# Patient Record
Sex: Female | Born: 1935 | Race: White | Hispanic: No | Smoking: Never smoker
Health system: Southern US, Community
[De-identification: ages and names within clinical notes are randomized; demographics above are authoritative.]

## PROBLEM LIST (undated history)

## (undated) DIAGNOSIS — E079 Disorder of thyroid, unspecified: Secondary | ICD-10-CM

## (undated) DIAGNOSIS — L299 Pruritus, unspecified: Secondary | ICD-10-CM

## (undated) DIAGNOSIS — D46C Myelodysplastic syndrome with isolated del(5q) chromosomal abnormality: Principal | ICD-10-CM

## (undated) DIAGNOSIS — R1012 Left upper quadrant pain: Secondary | ICD-10-CM

## (undated) DIAGNOSIS — L409 Psoriasis, unspecified: Secondary | ICD-10-CM

## (undated) DIAGNOSIS — D649 Anemia, unspecified: Secondary | ICD-10-CM

## (undated) DIAGNOSIS — E7800 Pure hypercholesterolemia, unspecified: Secondary | ICD-10-CM

## (undated) DIAGNOSIS — M79604 Pain in right leg: Principal | ICD-10-CM

## (undated) DIAGNOSIS — M79605 Pain in left leg: Principal | ICD-10-CM

## (undated) DIAGNOSIS — E119 Type 2 diabetes mellitus without complications: Secondary | ICD-10-CM

## (undated) DIAGNOSIS — I10 Essential (primary) hypertension: Secondary | ICD-10-CM

## (undated) HISTORY — DX: Anemia, unspecified: D64.9

## (undated) HISTORY — DX: Myelodysplastic syndrome with isolated del(5q) chromosomal abnormality: D46.C

## (undated) HISTORY — DX: Psoriasis, unspecified: L40.9

## (undated) HISTORY — DX: Essential (primary) hypertension: I10

## (undated) HISTORY — PX: ABDOMINAL HYSTERECTOMY: SHX81

## (undated) HISTORY — DX: Type 2 diabetes mellitus without complications: E11.9

## (undated) HISTORY — PX: BACK SURGERY: SHX140

## (undated) HISTORY — DX: Pain in left leg: M79.605

## (undated) HISTORY — DX: Pruritus, unspecified: L29.9

## (undated) HISTORY — DX: Left upper quadrant pain: R10.12

## (undated) HISTORY — DX: Pain in right leg: M79.604

## (undated) HISTORY — PX: OTHER SURGICAL HISTORY: SHX169

---

## 1999-09-05 ENCOUNTER — Encounter: Admission: RE | Admit: 1999-09-05 | Discharge: 1999-09-05 | Payer: Self-pay | Admitting: Family Medicine

## 1999-09-05 ENCOUNTER — Encounter: Payer: Self-pay | Admitting: Family Medicine

## 1999-09-15 ENCOUNTER — Encounter: Payer: Self-pay | Admitting: Family Medicine

## 1999-09-15 ENCOUNTER — Encounter: Admission: RE | Admit: 1999-09-15 | Discharge: 1999-09-15 | Payer: Self-pay | Admitting: Family Medicine

## 2000-07-30 ENCOUNTER — Encounter: Payer: Self-pay | Admitting: Family Medicine

## 2000-07-30 ENCOUNTER — Encounter: Admission: RE | Admit: 2000-07-30 | Discharge: 2000-07-30 | Payer: Self-pay | Admitting: Family Medicine

## 2000-09-23 ENCOUNTER — Encounter: Payer: Self-pay | Admitting: Family Medicine

## 2000-09-23 ENCOUNTER — Encounter: Admission: RE | Admit: 2000-09-23 | Discharge: 2000-09-23 | Payer: Self-pay | Admitting: Family Medicine

## 2001-09-26 ENCOUNTER — Encounter: Admission: RE | Admit: 2001-09-26 | Discharge: 2001-09-26 | Payer: Self-pay | Admitting: Family Medicine

## 2001-09-26 ENCOUNTER — Encounter: Payer: Self-pay | Admitting: Family Medicine

## 2002-09-13 ENCOUNTER — Encounter: Admission: RE | Admit: 2002-09-13 | Discharge: 2002-09-13 | Payer: Self-pay | Admitting: Family Medicine

## 2002-09-13 ENCOUNTER — Encounter: Payer: Self-pay | Admitting: Family Medicine

## 2002-12-28 ENCOUNTER — Encounter: Payer: Self-pay | Admitting: Family Medicine

## 2002-12-28 ENCOUNTER — Encounter: Admission: RE | Admit: 2002-12-28 | Discharge: 2002-12-28 | Payer: Self-pay | Admitting: Family Medicine

## 2003-09-17 ENCOUNTER — Encounter: Admission: RE | Admit: 2003-09-17 | Discharge: 2003-09-17 | Payer: Self-pay | Admitting: Family Medicine

## 2004-09-30 ENCOUNTER — Encounter: Admission: RE | Admit: 2004-09-30 | Discharge: 2004-09-30 | Payer: Self-pay | Admitting: Family Medicine

## 2005-11-10 ENCOUNTER — Encounter: Admission: RE | Admit: 2005-11-10 | Discharge: 2005-11-10 | Payer: Self-pay | Admitting: Family Medicine

## 2006-10-12 ENCOUNTER — Encounter: Admission: RE | Admit: 2006-10-12 | Discharge: 2006-10-12 | Payer: Self-pay | Admitting: Family Medicine

## 2006-12-02 ENCOUNTER — Encounter: Admission: RE | Admit: 2006-12-02 | Discharge: 2006-12-02 | Payer: Self-pay | Admitting: Endocrinology

## 2006-12-28 ENCOUNTER — Other Ambulatory Visit: Admission: RE | Admit: 2006-12-28 | Discharge: 2006-12-28 | Payer: Self-pay | Admitting: Interventional Radiology

## 2006-12-28 ENCOUNTER — Encounter: Admission: RE | Admit: 2006-12-28 | Discharge: 2006-12-28 | Payer: Self-pay | Admitting: Endocrinology

## 2006-12-28 ENCOUNTER — Encounter (INDEPENDENT_AMBULATORY_CARE_PROVIDER_SITE_OTHER): Payer: Self-pay | Admitting: *Deleted

## 2008-06-01 ENCOUNTER — Encounter: Admission: RE | Admit: 2008-06-01 | Discharge: 2008-06-01 | Payer: Self-pay | Admitting: Internal Medicine

## 2010-10-02 ENCOUNTER — Encounter
Admission: RE | Admit: 2010-10-02 | Discharge: 2010-10-02 | Payer: Self-pay | Source: Home / Self Care | Attending: Family Medicine | Admitting: Family Medicine

## 2011-07-06 ENCOUNTER — Other Ambulatory Visit: Payer: Self-pay | Admitting: Family Medicine

## 2011-07-06 DIAGNOSIS — R1013 Epigastric pain: Secondary | ICD-10-CM

## 2011-07-06 DIAGNOSIS — R1110 Vomiting, unspecified: Secondary | ICD-10-CM

## 2011-07-07 ENCOUNTER — Ambulatory Visit
Admission: RE | Admit: 2011-07-07 | Discharge: 2011-07-07 | Disposition: A | Discharge status: Home / Self Care | Payer: Medicare Other | Source: Ambulatory Visit | Attending: Family Medicine | Admitting: Family Medicine

## 2011-07-07 DIAGNOSIS — R1013 Epigastric pain: Secondary | ICD-10-CM

## 2011-07-07 DIAGNOSIS — R1110 Vomiting, unspecified: Secondary | ICD-10-CM

## 2011-09-01 ENCOUNTER — Encounter: Payer: Self-pay | Admitting: Gastroenterology

## 2011-09-01 ENCOUNTER — Ambulatory Visit (INDEPENDENT_AMBULATORY_CARE_PROVIDER_SITE_OTHER): Payer: Medicare Other | Admitting: Gastroenterology

## 2011-09-01 VITALS — BP 110/60 | HR 64 | Ht 60.0 in | Wt 134.2 lb

## 2011-09-01 DIAGNOSIS — Z800 Family history of malignant neoplasm of digestive organs: Secondary | ICD-10-CM

## 2011-09-01 DIAGNOSIS — D649 Anemia, unspecified: Secondary | ICD-10-CM

## 2011-09-01 DIAGNOSIS — I10 Essential (primary) hypertension: Secondary | ICD-10-CM | POA: Insufficient documentation

## 2011-09-01 MED ORDER — PEG-KCL-NACL-NASULF-NA ASC-C 100 G PO SOLR: 1.0000 | ORAL | Status: DC

## 2011-09-01 NOTE — Patient Instructions (Signed)
You will be set up for a colonoscopy for family history of colon cancer, anemia. We will set you up to see Dr. Lorenz Coaster as a new patient.

## 2011-09-01 NOTE — Progress Notes (Signed)
HPI: This is a   very pleasant 77 year old woman who is here with one of her daughters today  Her daughter was in her 62s when she was diagnosed with colon cancer.  She underwent surgery, then chemo post op.    She has had left sided abd pains.  [place] last month was normal.  The pain started about 1-2 months ago, then worsened.  She was having chills with it, but no fevers.  She completed a course of cipro/flagyl about 2 weeks. The pain has dramatically improved but still intermittently feels the pain.    Overall her weight is down 8 pounds in past 6-8 months.  She has been eating less.    She had a colonoscopy with SML at least 10 years ago.   Labs done earlier this month show that she is slightly anemic with a hemoglobin of 10.3. She is not microcytic.    Review of systems: Pertinent positive and negative review of systems were noted in the above HPI section. Complete review of systems was performed and was otherwise normal.    Past Medical History  Diagnosis Date  . Anemia, unspecified   . Hypertension     Past Surgical History  Procedure Date  . Abdominal hysterectomy   . Back surgery     Current Outpatient Prescriptions  Medication Sig Dispense Refill  . aspirin 81 MG tablet Take 81 mg by mouth daily.        . cholecalciferol (VITAMIN D) 1000 UNITS tablet Take 1,000 Units by mouth daily.        Marland Kitchen desoximetasone (TOPICORT) 0.25 % cream Apply 1 application topically 2 (two) times daily.        Marland Kitchen LORazepam (ATIVAN) 0.5 MG tablet Take 0.5 mg by mouth every 8 (eight) hours.        . metoprolol (LOPRESSOR) 100 MG tablet Take 100 mg by mouth 2 (two) times daily.        . mometasone (ELOCON) 0.1 % cream Apply 1 application topically daily.        . Multiple Vitamin (MULTIVITAMIN) tablet Take 1 tablet by mouth daily.        . nisoldipine (SULAR) 25.5 MG 24 hr tablet Take 25.5 mg by mouth daily.        Marland Kitchen olmesartan-hydrochlorothiazide (BENICAR HCT) 40-25 MG per tablet Take 1 tablet  by mouth daily.        . potassium chloride (KLOR-CON) 20 MEQ packet Take 20 mEq by mouth 2 (two) times daily.        . pravastatin (PRAVACHOL) 40 MG tablet Take 40 mg by mouth daily.          Allergies as of 09/01/2011 - Review Complete 09/01/2011  Allergen Reaction Noted  . Codeine  09/01/2011  . Oxycodone  09/01/2011    Family History  Problem Relation Age of Onset  . Colon cancer Daughter   . Heart disease Maternal Grandmother   . Heart disease Paternal Grandfather     History   Social History  . Marital Status: Widowed    Spouse Name: N/A    Number of Children: 4  . Years of Education: N/A   Occupational History  . RETIRED    Social History Main Topics  . Smoking status: Never Smoker   . Smokeless tobacco: Never Used  . Alcohol Use: No  . Drug Use: No  . Sexually Active: Not on file   Other Topics Concern  . Not on file  Social History Narrative  . No narrative on file       Physical Exam: BP 110/60  Pulse 64  Ht 5' (1.524 m)  Wt 134 lb 3.2 oz (60.873 kg)  BMI 26.21 kg/m2 Constitutional: generally well-appearing Psychiatric: alert and oriented x3 Eyes: extraocular movements intact Mouth: oral pharynx moist, no lesions Neck: supple no lymphadenopathy Cardiovascular: heart regular rate and rhythm Lungs: clear to auscultation bilaterally Abdomen: soft, nontender, nondistended, no obvious ascites, no peritoneal signs, normal bowel sounds Extremities: no lower extremity edema bilaterally Skin: no lesions on visible extremities    Assessment and plan: 77 y.o. female with  anemia, recent clinical diverticulitis, family history of colon cancer  We should proceed with colonoscopy at her soonest convenience. One of her daughters had colon cancer, she herself recently had clinical diverticulitis that has since resolved on proper antibiotics.

## 2011-09-07 ENCOUNTER — Ambulatory Visit (AMBULATORY_SURGERY_CENTER): Payer: Medicare Other | Admitting: Gastroenterology

## 2011-09-07 ENCOUNTER — Encounter: Payer: Self-pay | Admitting: Gastroenterology

## 2011-09-07 VITALS — BP 163/59 | HR 91 | Temp 97.8°F | Resp 20 | Ht 60.0 in | Wt 134.0 lb

## 2011-09-07 DIAGNOSIS — Z800 Family history of malignant neoplasm of digestive organs: Secondary | ICD-10-CM

## 2011-09-07 DIAGNOSIS — K5730 Diverticulosis of large intestine without perforation or abscess without bleeding: Secondary | ICD-10-CM

## 2011-09-07 DIAGNOSIS — D126 Benign neoplasm of colon, unspecified: Secondary | ICD-10-CM

## 2011-09-07 DIAGNOSIS — D649 Anemia, unspecified: Secondary | ICD-10-CM

## 2011-09-07 MED ORDER — SODIUM CHLORIDE 0.9 % IV SOLN: 500.0000 mL | INTRAVENOUS | Status: DC

## 2011-09-07 NOTE — Progress Notes (Signed)
Patient did not experience any of the following events: a burn prior to discharge; a fall within the facility; wrong site/side/patient/procedure/implant event; or a hospital transfer or hospital admission upon discharge from the facility. (G8907) Patient did not have preoperative order for IV antibiotic SSI prophylaxis. (G8918)  

## 2011-09-07 NOTE — Patient Instructions (Signed)
Please review your discharge instructions (blue and green sheets)  Await pathology- Dr Christella Hartigan removed 9 polyps  Resume your normal medications  Review information about polyps, diverticulosis, and high fiber diets

## 2011-09-08 ENCOUNTER — Telehealth: Payer: Self-pay | Admitting: *Deleted

## 2011-09-08 NOTE — Telephone Encounter (Signed)

## 2011-09-21 ENCOUNTER — Telehealth: Payer: Self-pay | Admitting: Gastroenterology

## 2011-09-21 NOTE — Telephone Encounter (Signed)
Pt has had nausea and vomiting since Friday, no appetite.  Left side abd pain, this is the same pain she had prior to colonoscopy.  Pt ate fish and hush puppies and then the nausea and vomiting started.  She feels better today but has off and on pain.  The pt's daughter has also called her PCP and will call back if they think she needs to see GI.

## 2011-09-21 NOTE — Telephone Encounter (Signed)
ok 

## 2011-12-04 ENCOUNTER — Other Ambulatory Visit: Payer: Self-pay | Admitting: Internal Medicine

## 2011-12-04 DIAGNOSIS — E042 Nontoxic multinodular goiter: Secondary | ICD-10-CM

## 2011-12-04 DIAGNOSIS — E0590 Thyrotoxicosis, unspecified without thyrotoxic crisis or storm: Secondary | ICD-10-CM

## 2011-12-16 ENCOUNTER — Encounter (HOSPITAL_COMMUNITY)
Admission: RE | Admit: 2011-12-16 | Discharge: 2011-12-16 | Disposition: A | Discharge status: Home / Self Care | Payer: Medicare Other | Source: Ambulatory Visit | Attending: Internal Medicine | Admitting: Internal Medicine

## 2011-12-16 DIAGNOSIS — E0590 Thyrotoxicosis, unspecified without thyrotoxic crisis or storm: Secondary | ICD-10-CM | POA: Insufficient documentation

## 2011-12-16 DIAGNOSIS — E042 Nontoxic multinodular goiter: Secondary | ICD-10-CM | POA: Insufficient documentation

## 2011-12-17 ENCOUNTER — Encounter (HOSPITAL_COMMUNITY)
Admission: RE | Admit: 2011-12-17 | Discharge: 2011-12-17 | Disposition: A | Discharge status: Home / Self Care | Payer: Medicare Other | Source: Ambulatory Visit | Attending: Internal Medicine | Admitting: Internal Medicine

## 2011-12-17 DIAGNOSIS — E042 Nontoxic multinodular goiter: Secondary | ICD-10-CM | POA: Insufficient documentation

## 2011-12-17 DIAGNOSIS — E0590 Thyrotoxicosis, unspecified without thyrotoxic crisis or storm: Secondary | ICD-10-CM | POA: Insufficient documentation

## 2011-12-17 MED ORDER — SODIUM IODIDE I 131 CAPSULE
10.6000 | Freq: Once | INTRAVENOUS | Status: AC | PRN
  Administered 2011-12-16: 10.6 via ORAL

## 2011-12-17 MED ORDER — SODIUM PERTECHNETATE TC 99M INJECTION
10.5000 | Freq: Once | INTRAVENOUS | Status: AC | PRN
  Administered 2011-12-17: 11 via INTRAVENOUS

## 2012-01-05 ENCOUNTER — Other Ambulatory Visit: Payer: Self-pay | Admitting: Internal Medicine

## 2012-01-05 DIAGNOSIS — E042 Nontoxic multinodular goiter: Secondary | ICD-10-CM

## 2012-01-13 ENCOUNTER — Ambulatory Visit
Admission: RE | Admit: 2012-01-13 | Discharge: 2012-01-13 | Disposition: A | Discharge status: Home / Self Care | Payer: Medicare Other | Source: Ambulatory Visit | Attending: Internal Medicine | Admitting: Internal Medicine

## 2012-01-13 DIAGNOSIS — E042 Nontoxic multinodular goiter: Secondary | ICD-10-CM

## 2012-08-25 ENCOUNTER — Observation Stay (HOSPITAL_COMMUNITY)
Admission: EM | Admit: 2012-08-25 | Discharge: 2012-08-27 | Disposition: A | Discharge status: Home / Self Care | Payer: Medicare Other | Attending: Internal Medicine | Admitting: Internal Medicine

## 2012-08-25 ENCOUNTER — Encounter (HOSPITAL_COMMUNITY): Payer: Self-pay | Admitting: Emergency Medicine

## 2012-08-25 ENCOUNTER — Emergency Department (HOSPITAL_COMMUNITY): Payer: Medicare Other

## 2012-08-25 DIAGNOSIS — H538 Other visual disturbances: Secondary | ICD-10-CM | POA: Insufficient documentation

## 2012-08-25 DIAGNOSIS — R112 Nausea with vomiting, unspecified: Secondary | ICD-10-CM | POA: Insufficient documentation

## 2012-08-25 DIAGNOSIS — R630 Anorexia: Secondary | ICD-10-CM | POA: Insufficient documentation

## 2012-08-25 DIAGNOSIS — R42 Dizziness and giddiness: Principal | ICD-10-CM | POA: Insufficient documentation

## 2012-08-25 DIAGNOSIS — R51 Headache: Secondary | ICD-10-CM | POA: Insufficient documentation

## 2012-08-25 DIAGNOSIS — I10 Essential (primary) hypertension: Secondary | ICD-10-CM | POA: Insufficient documentation

## 2012-08-25 DIAGNOSIS — Z7902 Long term (current) use of antithrombotics/antiplatelets: Secondary | ICD-10-CM | POA: Insufficient documentation

## 2012-08-25 DIAGNOSIS — Z79899 Other long term (current) drug therapy: Secondary | ICD-10-CM | POA: Insufficient documentation

## 2012-08-25 DIAGNOSIS — D630 Anemia in neoplastic disease: Secondary | ICD-10-CM | POA: Diagnosis present

## 2012-08-25 DIAGNOSIS — D649 Anemia, unspecified: Secondary | ICD-10-CM | POA: Insufficient documentation

## 2012-08-25 DIAGNOSIS — E7800 Pure hypercholesterolemia, unspecified: Secondary | ICD-10-CM | POA: Insufficient documentation

## 2012-08-25 HISTORY — DX: Pure hypercholesterolemia, unspecified: E78.00

## 2012-08-25 HISTORY — DX: Disorder of thyroid, unspecified: E07.9

## 2012-08-25 LAB — CBC WITH DIFFERENTIAL/PLATELET
Basophils Absolute: 0.1 10*3/uL (ref 0.0–0.1)
Basophils Relative: 2 % — ABNORMAL HIGH (ref 0–1)
Eosinophils Absolute: 0.2 10*3/uL (ref 0.0–0.7)
Eosinophils Relative: 6 % — ABNORMAL HIGH (ref 0–5)
HCT: 30 % — ABNORMAL LOW (ref 36.0–46.0)
MCH: 30.3 pg (ref 26.0–34.0)
MCHC: 33.3 g/dL (ref 30.0–36.0)
MCV: 90.9 fL (ref 78.0–100.0)
Monocytes Absolute: 0.2 10*3/uL (ref 0.1–1.0)
RDW: 18.5 % — ABNORMAL HIGH (ref 11.5–15.5)

## 2012-08-25 LAB — URINALYSIS, ROUTINE W REFLEX MICROSCOPIC
Ketones, ur: NEGATIVE mg/dL
Leukocytes, UA: NEGATIVE
Nitrite: NEGATIVE
Specific Gravity, Urine: 1.017 (ref 1.005–1.030)
Urobilinogen, UA: 0.2 mg/dL (ref 0.0–1.0)
pH: 7 (ref 5.0–8.0)

## 2012-08-25 LAB — COMPREHENSIVE METABOLIC PANEL
AST: 17 U/L (ref 0–37)
Albumin: 4.3 g/dL (ref 3.5–5.2)
Calcium: 9.7 mg/dL (ref 8.4–10.5)
Creatinine, Ser: 0.57 mg/dL (ref 0.50–1.10)

## 2012-08-25 LAB — TROPONIN I: Troponin I: <0.3 ng/mL (ref <0.30)

## 2012-08-25 LAB — URINE MICROSCOPIC-ADD ON

## 2012-08-25 LAB — LIPASE, BLOOD: Lipase: 13 U/L (ref 11–59)

## 2012-08-25 MED ORDER — ONDANSETRON HCL 4 MG/2ML IJ SOLN
4.0000 mg | Freq: Once | INTRAMUSCULAR | Status: AC
  Administered 2012-08-25: 4 mg via INTRAVENOUS
  Filled 2012-08-25: qty 2

## 2012-08-25 MED ORDER — SODIUM CHLORIDE 0.9 % IV BOLUS (SEPSIS)
500.0000 mL | Freq: Once | INTRAVENOUS | Status: AC
  Administered 2012-08-25: 500 mL via INTRAVENOUS

## 2012-08-25 MED ORDER — LORAZEPAM 1 MG PO TABS
0.5000 mg | ORAL_TABLET | Freq: Once | ORAL | Status: AC
  Administered 2012-08-25: 0.5 mg via ORAL
  Filled 2012-08-25: qty 1

## 2012-08-25 NOTE — ED Provider Notes (Signed)
History     CSN: [id number]  Arrival date & time 08/25/12  1705   First MD Initiated Contact with Patient 08/25/12 1722      Chief Complaint  Patient presents with  . Dizziness  . Nausea  . Emesis    (Consider location/radiation/quality/duration/timing/severity/associated sxs/prior treatment) HPI: Mrs. Barb is an 78 yo CF with pertinent history of anemia, HTN, thyroid disease and HLD who presents with dizziness, headache, nausea and vomiting. Upon waking this morning she noted blurred vision and nausea. She vomited several times today. Initially gastric content, then becoming green liquid. After several episodes of vomiting she developed frontal headache and dizziness which is described further as lightheadedness with standing and head movement. No vertigo. She was unsteady on her feet but not falling to one side. She denies abdominal pain, diarrhea, dark stools, or syncopal episodes. Vomiting has improved but she remains nauseated. She was evaluated by her PCP who diagnosed her with vertigo and recommended she come to the ED for further management.   Past Medical History  Diagnosis Date  . Anemia, unspecified   . Hypertension   . Thyroid disease   . Hypercholesteremia     Past Surgical History  Procedure Date  . Abdominal hysterectomy   . Back surgery   . Arthroscopy right knee   . Arthroscopy left knee     Family History  Problem Relation Age of Onset  . Colon cancer Daughter   . Heart disease Maternal Grandmother   . Heart disease Paternal Grandfather     History  Substance Use Topics  . Smoking status: Never Smoker   . Smokeless tobacco: Never Used  . Alcohol Use: No    OB History    Grav Para Term Preterm Abortions TAB SAB Ect Mult Living                 Review of Systems  Constitutional: Positive for appetite change (decreased today). Negative for fever, chills and fatigue.  Eyes: Positive for visual disturbance (blurred vision). Negative for  photophobia.  Respiratory: Negative for cough, chest tightness and shortness of breath.   Cardiovascular: Negative for chest pain and palpitations.  Gastrointestinal: Negative for nausea, vomiting, abdominal pain and blood in stool.  Genitourinary: Negative for dysuria, decreased urine volume and difficulty urinating.  Musculoskeletal: Negative for myalgias and arthralgias.  Skin: Negative for pallor and rash.  Neurological: Positive for dizziness and headaches (frontal). Negative for weakness.  Psychiatric/Behavioral: Negative for confusion and agitation.  All other systems reviewed and are negative.   Allergies  Codeine and Oxycodone  Home Medications   Current Outpatient Rx  Name  Route  Sig  Dispense  Refill  . ASPIRIN 81 MG PO TABS   Oral   Take 81 mg by mouth daily.           Marland Kitchen VITAMIN D 1000 UNITS PO TABS   Oral   Take 1,000 Units by mouth daily.           . DESOXIMETASONE 0.25 % EX CREA   Topical   Apply 1 application topically 2 (two) times daily.           Marland Kitchen LORAZEPAM 0.5 MG PO TABS   Oral   Take 0.5 mg by mouth every 8 (eight) hours.           Marland Kitchen METOPROLOL TARTRATE 100 MG PO TABS   Oral   Take 100 mg by mouth 2 (two) times daily.           Marland Kitchen  MOMETASONE FUROATE 0.1 % EX CREA   Topical   Apply 1 application topically daily.           Marland Kitchen ONE-DAILY MULTI VITAMINS PO TABS   Oral   Take 1 tablet by mouth daily.           Marland Kitchen NISOLDIPINE ER 25.5 MG PO TB24   Oral   Take 25.5 mg by mouth daily.           Marland Kitchen OLMESARTAN MEDOXOMIL-HCTZ 40-25 MG PO TABS   Oral   Take 1 tablet by mouth daily.           Marland Kitchen POTASSIUM CHLORIDE 20 MEQ PO PACK   Oral   Take 20 mEq by mouth 2 (two) times daily.           Marland Kitchen PRAVASTATIN SODIUM 40 MG PO TABS   Oral   Take 40 mg by mouth daily.             BP 147/49  Pulse 70  Resp 18  SpO2 98%  Physical Exam  Nursing note and vitals reviewed. Constitutional: She is oriented to person, place, and time.        Elderly female, appears younger than stated age   HENT:  Head: Normocephalic and atraumatic.  Eyes: Conjunctivae normal and EOM are normal. Pupils are equal, round, and reactive to light.  Neck: Normal range of motion. Neck supple.  Cardiovascular: Normal rate and regular rhythm.   Murmur (2/6 SM) heard. Pulmonary/Chest: Effort normal and breath sounds normal.  Abdominal: Soft. Bowel sounds are normal. There is no tenderness.  Musculoskeletal: Normal range of motion. She exhibits no tenderness.  Neurological: She is alert and oriented to person, place, and time.  Skin: Skin is warm and dry.   ED Course  Procedures (including critical care time)  Labs Reviewed  CBC WITH DIFFERENTIAL - Abnormal; Notable for the following:    RBC 3.30 (*)     Hemoglobin 10.0 (*)     HCT 30.0 (*)     RDW 18.5 (*)     Lymphs Abs 0.6 (*)     Eosinophils Relative 6 (*)     Basophils Relative 2 (*)     All other components within normal limits  COMPREHENSIVE METABOLIC PANEL - Abnormal; Notable for the following:    Glucose, Bld 157 (*)     GFR calc non Af Amer 82 (*)     All other components within normal limits  URINALYSIS, ROUTINE W REFLEX MICROSCOPIC - Abnormal; Notable for the following:    Protein, ur 100 (*)     All other components within normal limits  URINE MICROSCOPIC-ADD ON - Abnormal; Notable for the following:    Squamous Epithelial / LPF FEW (*)     Casts HYALINE CASTS (*)     All other components within normal limits  LIPASE, BLOOD  TROPONIN I  TROPONIN I   Ct Head Wo Contrast  08/25/2012  *RADIOLOGY REPORT*  Clinical Data: Dizziness.  Nausea and emesis  CT HEAD WITHOUT CONTRAST  Technique:  Contiguous axial images were obtained from the base of the skull through the vertex without contrast.  Comparison: None.  Findings: There is mild low density within the subcortical and periventricular white matter consistent with mild small vessel ischemic change.  The brain has an otherwise  normal appearance without evidence for hemorrhage, infarction, hydrocephalus, or mass lesion.  There is no extra axial fluid collection.  The skull and paranasal  sinuses are normal.  IMPRESSION:  1.  No acute intracranial abnormalities. 2.  Mild small vessel ischemic change.   Original Report Authenticated By: Signa Kell, M.D.    No diagnosis found.  MDM  78 yo CF with history anemia, HTN and thyroid disease presents with dizziness, vomiting and blurred vision. Afebrile, vital signs stable. Symptom not c/w vertigo. Doubt intraabdominal pathology as no pain or tenderness. Concern for UTI vs ACS vs basilar insuffiencey. Head CT without evidence of increased ICP, bleeding or infection. Labs reveal Hgb 10, WBC 4, normal electrolytes, cr 0.57. Troponin undetectable. Urine without evidence of infection. Delta troponin negative as well. Lipase 13. EKG shows rate of 63, no significant ST or T wave abnormalities. Doubt ACS. Patient treated with Zofran x2, ativan and IVF's with improvement of dizziness but nausea persists. Due to persistent symptoms concern for basilar insufficiency, discussed with Hospitalist for admission and continued evaluation and symptomatic treatment. She remained HD stable. Discussed w/u and plan for admission with patient, she is in agreement with plan.   Reviewed imaging, labs and previous medical records, utilized in MDM  Discussed case with Dr. Jeraldine Loots.   Clinical Impression 1. Dizziness 2. Nausea 3. Chronic anemia         Margie Billet, MD 08/26/12 [phone]

## 2012-08-25 NOTE — ED Notes (Signed)
Flow manager called and she reported she would place a bed request.

## 2012-08-25 NOTE — ED Notes (Signed)
Pt sent from PCP with dizziness and N/V; pt unable to walk and sts started yesterday and was worse today

## 2012-08-26 ENCOUNTER — Observation Stay (HOSPITAL_COMMUNITY): Payer: Medicare Other

## 2012-08-26 ENCOUNTER — Encounter (HOSPITAL_COMMUNITY): Payer: Self-pay | Admitting: Internal Medicine

## 2012-08-26 DIAGNOSIS — E7800 Pure hypercholesterolemia, unspecified: Secondary | ICD-10-CM

## 2012-08-26 DIAGNOSIS — R42 Dizziness and giddiness: Principal | ICD-10-CM | POA: Diagnosis present

## 2012-08-26 DIAGNOSIS — D630 Anemia in neoplastic disease: Secondary | ICD-10-CM | POA: Diagnosis present

## 2012-08-26 DIAGNOSIS — R112 Nausea with vomiting, unspecified: Secondary | ICD-10-CM | POA: Diagnosis present

## 2012-08-26 DIAGNOSIS — I10 Essential (primary) hypertension: Secondary | ICD-10-CM

## 2012-08-26 LAB — LIPID PANEL
HDL: 44 mg/dL (ref >39)
LDL Cholesterol: 52 mg/dL (ref 0–99)
Triglycerides: 102 mg/dL (ref <150)
VLDL: 20 mg/dL (ref 0–40)

## 2012-08-26 LAB — RAPID URINE DRUG SCREEN, HOSP PERFORMED
Barbiturates: NOT DETECTED
Benzodiazepines: NOT DETECTED
Cocaine: NOT DETECTED
Tetrahydrocannabinol: NOT DETECTED

## 2012-08-26 LAB — CBC WITH DIFFERENTIAL/PLATELET
Basophils Relative: 1 % (ref 0–1)
Eosinophils Absolute: 0.2 10*3/uL (ref 0.0–0.7)
Eosinophils Relative: 5 % (ref 0–5)
Hemoglobin: 8.8 g/dL — ABNORMAL LOW (ref 12.0–15.0)
Lymphs Abs: 1.2 10*3/uL (ref 0.7–4.0)
MCH: 30.3 pg (ref 26.0–34.0)
MCHC: 33.1 g/dL (ref 30.0–36.0)
MCV: 91.7 fL (ref 78.0–100.0)
Monocytes Relative: 7 % (ref 3–12)
Platelets: 281 10*3/uL (ref 150–400)
RBC: 2.9 MIL/uL — ABNORMAL LOW (ref 3.87–5.11)

## 2012-08-26 LAB — BASIC METABOLIC PANEL
BUN: 17 mg/dL (ref 6–23)
CO2: 28 mEq/L (ref 19–32)
Calcium: 9.1 mg/dL (ref 8.4–10.5)
GFR calc non Af Amer: 74 mL/min — ABNORMAL LOW (ref >90)
Glucose, Bld: 103 mg/dL — ABNORMAL HIGH (ref 70–99)
Sodium: 143 mEq/L (ref 135–145)

## 2012-08-26 MED ORDER — ACITRETIN 25 MG PO CAPS: 25.0000 mg | ORAL_CAPSULE | ORAL | Status: DC

## 2012-08-26 MED ORDER — AZELASTINE HCL 0.1 % NA SOLN: 1.0000 | Freq: Two times a day (BID) | NASAL | Status: DC | PRN

## 2012-08-26 MED ORDER — SIMVASTATIN 20 MG PO TABS
20.0000 mg | ORAL_TABLET | Freq: Every day | ORAL | Status: DC
  Administered 2012-08-26: 20 mg via ORAL
  Filled 2012-08-26 (×2): qty 1

## 2012-08-26 MED ORDER — NISOLDIPINE ER 25.5 MG PO TB24
25.5000 mg | ORAL_TABLET | Freq: Every day | ORAL | Status: DC
  Administered 2012-08-26 – 2012-08-27 (×2): 25.5 mg via ORAL
  Filled 2012-08-26 (×2): qty 1

## 2012-08-26 MED ORDER — ACETAMINOPHEN 325 MG PO TABS: 650.0000 mg | ORAL_TABLET | ORAL | Status: DC | PRN

## 2012-08-26 MED ORDER — LORAZEPAM 0.5 MG PO TABS
0.5000 mg | ORAL_TABLET | Freq: Every day | ORAL | Status: DC
  Administered 2012-08-26: 0.5 mg via ORAL
  Filled 2012-08-26: qty 1

## 2012-08-26 MED ORDER — MECLIZINE HCL 25 MG PO TABS
25.0000 mg | ORAL_TABLET | Freq: Three times a day (TID) | ORAL | Status: DC
  Administered 2012-08-26 – 2012-08-27 (×3): 25 mg via ORAL
  Filled 2012-08-26 (×6): qty 1

## 2012-08-26 MED ORDER — IRBESARTAN 300 MG PO TABS
300.0000 mg | ORAL_TABLET | Freq: Every day | ORAL | Status: DC
  Administered 2012-08-26 – 2012-08-27 (×2): 300 mg via ORAL
  Filled 2012-08-26 (×2): qty 1

## 2012-08-26 MED ORDER — ASPIRIN 81 MG PO CHEW
81.0000 mg | CHEWABLE_TABLET | Freq: Every day | ORAL | Status: DC
  Administered 2012-08-26 – 2012-08-27 (×2): 81 mg via ORAL
  Filled 2012-08-26 (×3): qty 1

## 2012-08-26 MED ORDER — AZELASTINE-FLUTICASONE 137-50 MCG/ACT NA SUSP: 1.0000 | Freq: Two times a day (BID) | NASAL | Status: DC | PRN

## 2012-08-26 MED ORDER — SODIUM CHLORIDE 0.9 % IV SOLN
INTRAVENOUS | Status: AC
  Administered 2012-08-26: 01:00:00 via INTRAVENOUS

## 2012-08-26 MED ORDER — DEXTROSE-NACL 5-0.45 % IV SOLN: INTRAVENOUS | Status: DC

## 2012-08-26 MED ORDER — FLUTICASONE PROPIONATE 50 MCG/ACT NA SUSP: 1.0000 | Freq: Two times a day (BID) | NASAL | Status: DC | PRN

## 2012-08-26 MED ORDER — METOPROLOL SUCCINATE ER 100 MG PO TB24
100.0000 mg | ORAL_TABLET | Freq: Every day | ORAL | Status: DC
  Administered 2012-08-26 – 2012-08-27 (×2): 100 mg via ORAL
  Filled 2012-08-26 (×2): qty 1

## 2012-08-26 NOTE — Progress Notes (Signed)
Called by primary RN to complete NIHSS on new admit pt.  On arrival pt was being assisted to the [facility] with minimal assist.  On assessment pt is a NIHSS 0, denies nausea currently but states she is having intermittent dizziness still.  Pt has nystagmus.

## 2012-08-26 NOTE — Progress Notes (Signed)
  Pt admitted to the unit. Pt is stable, alert and oriented per baseline. Oriented to room, staff, and call bell. Educated to call for any assistance. Bed in lowest position, call bell within reach- will continue to monitor. 

## 2012-08-26 NOTE — Care Management Note (Signed)
    Page 1 of 1   08/26/2012     3:40:36 PM   CARE MANAGEMENT NOTE 08/26/2012  Patient:  Rebekah Cross, Rebekah Cross   Account Number:  [id number]  Date Initiated:  08/26/2012  Documentation initiated by:  Letha Cape  Subjective/Objective Assessment:   dx dizziness  admit-lives alone.     Action/Plan:   pt eval- rec outpt vestibular pt.   Anticipated DC Date:  08/27/2012   Anticipated DC Plan:  HOME/SELF CARE      DC Planning Services  CM consult      Choice offered to / List presented to:             Status of service:  Completed, signed off Medicare Important Message given?   (If response is "NO", the following Medicare IM given date fields will be blank) Date Medicare IM given:   Date Additional Medicare IM given:    Discharge Disposition:  HOME/SELF CARE  Per UR Regulation:  Reviewed for med. necessity/level of care/duration of stay  If discussed at Long Length of Stay Meetings, dates discussed:    Comments:  08/26/12 15:35 Letha Cape RN, BSN [phone] patient lives alone, per physical therapy recs outpt vestibular outpt pt,  [facility]  Patient for dc tomorrow. Patient has medication coverage and transportation.

## 2012-08-26 NOTE — Evaluation (Signed)
Physical Therapy Evaluation Patient Details Name: Rebekah Cross MRN: [id number] DOB: 18-Mar-1925 Today's Date: 08/26/2012 Time: 4098-1191 PT Time Calculation (min): 42 min  PT Assessment / Plan / Recommendation Clinical Impression  Pt s/p vertigo with decr mobility secondary to decr endurance and decr balance due to dizziness.  Will benefit from Outpt PT f/u for vestibular rehab as well as ENT consult.  Needs to use RW at all times on d/c.  Family states they will help patient at home.    PT Assessment  Patient needs continued PT services    Follow Up Recommendations  Outpatient PT;Supervision/Assistance - 24 hour    Does the patient have the potential to tolerate intense rehabilitation      Barriers to Discharge        Equipment Recommendations  None recommended by PT (pt has all equipment )    Recommendations for Other Services     Frequency Min 3X/week    Precautions / Restrictions Precautions Precautions: Fall Restrictions Weight Bearing Restrictions: No   Pertinent Vitals/Pain VSS, No pain      Mobility  Bed Mobility Bed Mobility: Rolling Right;Right Sidelying to Sit;Sitting - Scoot to Edge of Bed Rolling Right: 5: Supervision Right Sidelying to Sit: 5: Supervision Sitting - Scoot to Edge of Bed: 5: Supervision Details for Bed Mobility Assistance: Performed Modified Hallpike test bil in which both sides were negative.  Supine head roll test negative bil as well although questioned whether patient possibly has small amount of right horizontal canal canalithiasis.  Pt did not want to do BBQ roll as other testing had made her nauseous.  Pt with positive right head thrust suggesting right hypofunction.  Feel patient would benefit from ENT consult to determine cause of right hypofuntion if MRI is - for CVA.   Transfers Transfers: Sit to Stand;Stand to Sit Sit to Stand: 4: Min guard;With upper extremity assist;From bed Stand to Sit: 4: Min guard;With upper extremity  assist;With armrests;To chair/3-in-1 Details for Transfer Assistance: Pt needed cues to perform sit to stand for hand placement.   Ambulation/Gait Ambulation/Gait Assistance: 4: Min guard Ambulation Distance (Feet): 40 Feet Assistive device: 1 person hand held assist Ambulation/Gait Assistance Details: Pt. slightly unsteady at times.  Pt needed cues and assist for postural stability and postural checks at times.  To use RW at home until dizziness is better.   Gait Pattern: Step-through pattern;Decreased stride length Gait velocity: decreased Stairs: No Wheelchair Mobility Wheelchair Mobility: No         Exercises Other Exercises Other Exercises: x 1 exercises initiated side to side and up and down in sitting causing dizzy symptoms at 7/10 with exercises.  Performed 4 reps each   PT Diagnosis: Generalized weakness  PT Problem List: Decreased activity tolerance;Decreased balance;Decreased mobility;Decreased safety awareness;Decreased knowledge of use of DME PT Treatment Interventions: DME instruction;Gait training;Functional mobility training;Therapeutic activities;Therapeutic exercise;Balance training;Wheelchair mobility training;Patient/family education;Stair training   PT Goals Acute Rehab PT Goals PT Goal Formulation: With patient Time For Goal Achievement: 09/02/12 Potential to Achieve Goals: Good Pt will go Sit to Stand: Independently PT Goal: Sit to Stand - Progress: Goal set today Pt will Ambulate: 51 - 150 feet;with modified independence;with least restrictive assistive device PT Goal: Ambulate - Progress: Goal set today Pt will Go Up / Down Stairs: 3-5 stairs;with modified independence;with least restrictive assistive device PT Goal: Up/Down Stairs - Progress: Goal set today Pt will Perform Home Exercise Program: Independently (x1 exercises in standing with dizzy 3/10) PT Goal: Perform  Home Exercise Program - Progress: Goal set today  Visit Information  Last PT Received  On: 08/26/12 Assistance Needed: +1    Subjective Data  Subjective: "I feel dizzy when I stand up." Patient Stated Goal: KEep my yard clean   Prior Functioning  Home Living Lives With: Alone;Son (son across street) Available Help at Discharge: Family;Available 24 hours/day Type of Home: House Home Access: Stairs to enter [employer] of Steps: 4 Entrance Stairs-Rails: Right;Left;Can reach both Home Layout: One level Bathroom Shower/Tub: Health visitor: Standard Home Adaptive Equipment: Paediatric nurse with back;Straight cane Additional Comments: Family has any equipment patient will need Prior Function Level of Independence: Independent Able to Take Stairs?: Yes Driving: No Vocation: Retired Musician: No difficulties Dominant Hand: Right    Cognition  Overall Cognitive Status: Appears within functional limits for tasks assessed/performed Arousal/Alertness: Awake/alert Orientation Level: Appears intact for tasks assessed Behavior During Session: [facility] for tasks performed    Extremity/Trunk Assessment Right Lower Extremity Assessment RLE ROM/Strength/Tone: [facility] for tasks assessed Left Lower Extremity Assessment LLE ROM/Strength/Tone: [facility] for tasks assessed Trunk Assessment Trunk Assessment: Normal   Balance Static Standing Balance Static Standing - Balance Support: During functional activity;No upper extremity supported Static Standing - Level of Assistance: 5: Stand by assistance Static Standing - Comment/# of Minutes: 2 minutes   End of Session PT - End of Session Equipment Utilized During Treatment: Gait belt Activity Tolerance: Patient tolerated treatment well Patient left: in bed;with call bell/phone within reach;with family/visitor present Nurse Communication: Mobility status  GP Functional Assessment Tool Used: Clinical judgment Functional Limitation: Mobility: Walking and moving around Mobility: Walking and Moving Around  Current Status (K4401): At least 1 percent but less than 20 percent impaired, limited or restricted Mobility: Walking and Moving Around Goal Status [phone]): 0 percent impaired, limited or restricted   INGOLD,Kaan Tosh 08/26/2012, 1:02 PM  [facility] [phone] (pager)

## 2012-08-26 NOTE — Progress Notes (Signed)
PATIENT DETAILS Name: Rebekah Cross Age: 78 y.o. Sex: female Date of Birth: 1925/06/08 Admit Date: 08/25/2012 Admitting Physician Eduard Clos, MD [id number], MD  Subjective: Vertigo better today  Assessment/Plan: Principal Problem:  *Dizziness -suspect peripheral vertigo -MRI brain neg -add Meclizine -appreciate PT eval -outpatient vestibular PT on discharge  Active Problems:  Nausea and vomiting -2/2 above -resolved -tolerating regular diet   HTN (hypertension) -stable with moderate control -continue Avapro, Metoprolol and Sular   Anemia -mild decrease in Hb following hydration -suspect chronic issue-will defer w/u to the outpatient setting  Disposition: Remain inpatient-likely home in am  DVT Prophylaxis: SCD's  Code Status: Full code  Procedures:  None  CONSULTS: None  PHYSICAL EXAM: Vital signs in last 24 hours: Filed Vitals:   08/26/12 0040 08/26/12 0432 08/26/12 1027 08/26/12 1404  BP: 149/62 146/55 157/56 166/52  Pulse: 64 66 66 68  Temp: 98.2 F (36.8 C) 98.5 F (36.9 C) 98.7 F (37.1 C) 97.5 F (36.4 C)  TempSrc: Oral Oral Oral Oral  Resp: 16 20 18 18   Height: 5' (1.524 m)     Weight: 58 kg (127 lb 13.9 oz)     SpO2: 93% 96% 97% 95%    Weight change:  Body mass index is 24.97 kg/(m^2).   Gen Exam: Awake and alert with clear speech.  Neck: Supple, No JVD.   Chest: B/L Clear.   CVS: S1 S2 Regular, no murmurs.  Abdomen: soft, BS +, non tender, non distended.  Extremities: no edema, lower extremities warm to touch. Neurologic: Non Focal.   Skin: No Rash.   Wounds: N/A.    Intake/Output from previous day:  Intake/Output Summary (Last 24 hours) at 08/26/12 1438 Last data filed at 08/26/12 0800  Gross per 24 hour  Intake 553.75 ml  Output      0 ml  Net 553.75 ml     LAB RESULTS: CBC  Lab 08/26/12 0625 08/25/12 1747  WBC 4.4 4.0  HGB 8.8* 10.0*  HCT 26.6* 30.0*  PLT 281 294  MCV 91.7 90.9  MCH 30.3  30.3  MCHC 33.1 33.3  RDW 18.6* 18.5*  LYMPHSABS 1.2 0.6*  MONOABS 0.3 0.2  EOSABS 0.2 0.2  BASOSABS 0.1 0.1  BANDABS -- --    Chemistries   Lab 08/26/12 0625 08/25/12 1747  NA 143 138  K 3.2* 3.7  CL 106 99  CO2 28 26  GLUCOSE 103* 157*  BUN 17 18  CREATININE 0.78 0.57  CALCIUM 9.1 9.7  MG -- --    CBG:  Lab 08/26/12 1200 08/26/12 0837 08/26/12 0154  GLUCAP 106* 108* 104*    GFR Estimated Creatinine Clearance: 41 ml/min (by C-G formula based on Cr of 0.78).  Coagulation profile No results found for this basename: INR:5,PROTIME:5 in the last 168 hours  Cardiac Enzymes  Lab 08/25/12 2043 08/25/12 1748  CKMB -- --  TROPONINI <0.30 <0.30  MYOGLOBIN -- --    No components found with this basename: POCBNP:3 No results found for this basename: DDIMER:2 in the last 72 hours  Basename 08/26/12 0500  HGBA1C 5.7*    Basename 08/26/12 0625  CHOL 116  HDL 44  LDLCALC 52  TRIG 102  CHOLHDL 2.6  LDLDIRECT --    Basename 08/26/12 0625  TSH 0.120*  T4TOTAL --  T3FREE --  THYROIDAB --   No results found for this basename: VITAMINB12:2,FOLATE:2,FERRITIN:2,TIBC:2,IRON:2,RETICCTPCT:2 in the last 72 hours  Basename 08/25/12 1747  LIPASE 13  AMYLASE --  Urine Studies No results found for this basename: UACOL:2,UAPR:2,USPG:2,UPH:2,UTP:2,UGL:2,UKET:2,UBIL:2,UHGB:2,UNIT:2,UROB:2,ULEU:2,UEPI:2,UWBC:2,URBC:2,UBAC:2,CAST:2,CRYS:2,UCOM:2,BILUA:2 in the last 72 hours  MICROBIOLOGY: No results found for this or any previous visit (from the past 240 hour(s)).  RADIOLOGY STUDIES/RESULTS: Ct Head Wo Contrast  08/25/2012  *RADIOLOGY REPORT*  Clinical Data: Dizziness.  Nausea and emesis  CT HEAD WITHOUT CONTRAST  Technique:  Contiguous axial images were obtained from the base of the skull through the vertex without contrast.  Comparison: None.  Findings: There is mild low density within the subcortical and periventricular white matter consistent with mild small vessel  ischemic change.  The brain has an otherwise normal appearance without evidence for hemorrhage, infarction, hydrocephalus, or mass lesion.  There is no extra axial fluid collection.  The skull and paranasal sinuses are normal.  IMPRESSION:  1.  No acute intracranial abnormalities. 2.  Mild small vessel ischemic change.   Original Report Authenticated By: Signa Kell, M.D.    Mri Brain Without Contrast  08/26/2012  *RADIOLOGY REPORT*  Clinical Data: 78 year old female dizziness nausea and vomiting.  MRI HEAD WITHOUT CONTRAST  Technique:  Multiplanar, multiecho pulse sequences of the brain and surrounding structures were obtained according to standard protocol without intravenous contrast.  Comparison: Head CT 08/25/2012.  Findings: Cerebral volume is within normal limits for age.  Mildly heterogeneous diffusion imaging, but no convincing restricted diffusion to suggest acute infarct. No midline shift, ventriculomegaly, mass effect, evidence of mass lesion, extra-axial collection or acute intracranial hemorrhage.  Cervicomedullary junction and pituitary are within normal limits.  Major intracranial vascular flow voids are preserved.  Scattered minimal to mild for age cerebral white matter T2 and FLAIR hyperintensity. No cortical encephalomalacia.  Deep gray matter nuclei, brainstem and cerebellum are within normal limits for age.  Degenerative changes in the cervical spine, otherwise negative cervical spine for age.  Grossly normal visualized internal auditory structures.  Mastoids are clear.  Occasional small paranasal sinus mucous retention cysts.  Postoperative changes to the orbits.  Negative scalp soft tissues.  Decreased T1 marrow signal throughout the visualized osseous structures of the skull and cervical spine.  No overtly destructive osseous lesion.  Recent complete blood count reveals decreased hemoglobin and hematocrit level.  IMPRESSION: 1. No acute intracranial abnormality.  Largely unremarkable  for age noncontrast MRI appearance the brain. 2.  Diffusely abnormal for age but nonspecific bone marrow signal. This may relate to a diffuse red marrow reactivation given the patient's anemia, but infiltrative marrow processes also are possible.   Original Report Authenticated By: Erskine Speed, M.D.     MEDICATIONS: Scheduled Meds:   . acitretin  25 mg Oral Q M,W,F  . aspirin  81 mg Oral Daily  . irbesartan  300 mg Oral Daily  . [COMPLETED] LORazepam  0.5 mg Oral Once  . LORazepam  0.5-1 mg Oral QHS  . meclizine  25 mg Oral TID  . metoprolol succinate  100 mg Oral Daily  . nisoldipine  25.5 mg Oral Daily  . [COMPLETED] ondansetron (ZOFRAN) IV  4 mg Intravenous Once  . [COMPLETED] ondansetron (ZOFRAN) IV  4 mg Intravenous Once  . simvastatin  20 mg Oral q1800  . [COMPLETED] sodium chloride  500 mL Intravenous Once  . [DISCONTINUED] dextrose 5 % and 0.45% NaCl   Intravenous STAT   Continuous Infusions:   . [EXPIRED] sodium chloride 75 mL/hr at 08/26/12 0037   PRN Meds:.acetaminophen, azelastine, fluticasone, [DISCONTINUED] Azelastine-Fluticasone  Antibiotics: Anti-infectives    None       Jeoffrey Massed, MD  [facility] Pager:[phone]  If 7PM-7AM, please contact night-coverage [email or web address] Password [facility] 08/26/2012, 2:38 PM   LOS: 1 day

## 2012-08-26 NOTE — H&P (Addendum)
Rebekah Rebekah Cross is Rebekah Cross 78 y.o. female.   Patient was seen and examined on August 26, 2012. PCP - Dr. Cam Cross. Chief Complaint: Dizziness with nausea and vomiting. HPI: 78 year-old female with history of chronic anemia, hypertension, hyperlipidemia and history of thyroid ablation for hyperthyroidism last year presents with complaints of dizziness with nausea and vomiting since morning yesterday. Patient was unable to walk because of the dizziness. He had multiple episodes nausea and vomiting. Had mild frontal headache. Denies any loss of function of upper or lower extremities. Denies any loss of consciousness, any difficulty swallowing or speaking. Patient denies any abdominal pain dysuria discharges or diarrhea. Since the symptoms have been persistent she came to the ER. In the ER patient had a CT of the head which was negative for any acute. Patient's dizziness improved with Ativan by mouth but still feels mildly dizzy with nausea. She had not thought about coming to the ER. LFTs and lipase were normal abdominal exam was benign. At this time patient be admitted for further management concerning for CVA. Patient in addition states last one week patient has been having some pounding sensation in the right ear. Denies any chest or shortness of breath this time. EKG shows sinus rhythm with nonspecific changes. Cardiac enzymes were negative. Patient was not orthostatic in the ER as per the ER. Physician.  Past Medical History  Diagnosis Date  . Anemia, unspecified   . Hypertension   . Thyroid disease   . Hypercholesteremia     Past Surgical History  Procedure Date  . Abdominal hysterectomy   . Back surgery   . Arthroscopy right knee   . Arthroscopy left knee     Family History  Problem Relation Age of Onset  . Colon cancer Daughter   . Heart disease Maternal Grandmother   . Heart disease Paternal Grandfather    Social History:  reports that she has never smoked. She has never used  smokeless tobacco. She reports that she does not drink alcohol or use illicit drugs.  Allergies:  Allergies  Allergen Reactions  . Codeine   . Oxycodone Nausea And Vomiting and Other (See Comments)    "jerking"  . Methimazole (Thiamazole) Rash     (Not in a hospital admission)  Results for orders placed during the hospital encounter of 08/25/12 (from the past 48 hour(s))  CBC WITH DIFFERENTIAL     Status: Abnormal   Collection Time   08/25/12  5:47 PM      Component Value Range Comment   WBC 4.0  4.0 - 10.5 K/uL    RBC 3.30 (*) 3.87 - 5.11 MIL/uL    Hemoglobin 10.0 (*) 12.0 - 15.0 g/dL    HCT 16.1 (*) 09.6 - 46.0 %    MCV 90.9  78.0 - 100.0 fL    MCH 30.3  26.0 - 34.0 pg    MCHC 33.3  30.0 - 36.0 g/dL    RDW 04.5 (*) 40.9 - 15.5 %    Platelets 294  150 - 400 K/uL    Neutrophils Relative 73  43 - 77 %    Neutro Abs 2.9  1.7 - 7.7 K/uL    Lymphocytes Relative 14  12 - 46 %    Lymphs Abs 0.6 (*) 0.7 - 4.0 K/uL    Monocytes Relative 6  3 - 12 %    Monocytes Absolute 0.2  0.1 - 1.0 K/uL    Eosinophils Relative 6 (*) 0 - 5 %  Eosinophils Absolute 0.2  0.0 - 0.7 K/uL    Basophils Relative 2 (*) 0 - 1 %    Basophils Absolute 0.1  0.0 - 0.1 K/uL   COMPREHENSIVE METABOLIC PANEL     Status: Abnormal   Collection Time   08/25/12  5:47 PM      Component Value Range Comment   Sodium 138  135 - 145 mEq/L    Potassium 3.7  3.5 - 5.1 mEq/L    Chloride 99  96 - 112 mEq/L    CO2 26  19 - 32 mEq/L    Glucose, Bld 157 (*) 70 - 99 mg/dL    BUN 18  6 - 23 mg/dL    Creatinine, Ser 9.60  0.50 - 1.10 mg/dL    Calcium 9.7  8.4 - 45.4 mg/dL    Total Protein 7.1  6.0 - 8.3 g/dL    Albumin 4.3  3.5 - 5.2 g/dL    AST 17  0 - 37 U/L    ALT 17  0 - 35 U/L    Alkaline Phosphatase 66  39 - 117 U/L    Total Bilirubin 0.8  0.3 - 1.2 mg/dL    GFR calc non Af Amer 82 (*) >90 mL/min    GFR calc Af Amer >90  >90 mL/min   LIPASE, BLOOD     Status: Normal   Collection Time   08/25/12  5:47 PM       Component Value Range Comment   Lipase 13  11 - 59 U/L   TROPONIN I     Status: Normal   Collection Time   08/25/12  5:48 PM      Component Value Range Comment   Troponin I <0.30  <0.30 ng/mL   URINALYSIS, ROUTINE W REFLEX MICROSCOPIC     Status: Abnormal   Collection Time   08/25/12  8:15 PM      Component Value Range Comment   Color, Urine YELLOW  YELLOW    APPearance CLEAR  CLEAR    Specific Gravity, Urine 1.017  1.005 - 1.030    pH 7.0  5.0 - 8.0    Glucose, UA NEGATIVE  NEGATIVE mg/dL    Hgb urine dipstick NEGATIVE  NEGATIVE    Bilirubin Urine NEGATIVE  NEGATIVE    Ketones, ur NEGATIVE  NEGATIVE mg/dL    Protein, ur 098 (*) NEGATIVE mg/dL    Urobilinogen, UA 0.2  0.0 - 1.0 mg/dL    Nitrite NEGATIVE  NEGATIVE    Leukocytes, UA NEGATIVE  NEGATIVE   URINE MICROSCOPIC-ADD ON     Status: Abnormal   Collection Time   08/25/12  8:15 PM      Component Value Range Comment   Squamous Epithelial / LPF FEW (*) RARE    WBC, UA 0-2  <3 WBC/hpf    Casts HYALINE CASTS (*) NEGATIVE   TROPONIN I     Status: Normal   Collection Time   08/25/12  8:43 PM      Component Value Range Comment   Troponin I <0.30  <0.30 ng/mL    Ct Head Wo Contrast  08/25/2012  *RADIOLOGY REPORT*  Clinical Data: Dizziness.  Nausea and emesis  CT HEAD WITHOUT CONTRAST  Technique:  Contiguous axial images were obtained from the base of the skull through the vertex without contrast.  Comparison: None.  Findings: There is mild low density within the subcortical and periventricular white matter consistent with mild small vessel ischemic change.  The brain has Rebekah Cross otherwise normal appearance without evidence for hemorrhage, infarction, hydrocephalus, or mass lesion.  There is no extra axial fluid collection.  The skull and paranasal sinuses are normal.  IMPRESSION:  1.  No acute intracranial abnormalities. 2.  Mild small vessel ischemic change.   Original Report Authenticated By: Signa Kell, M.D.     Review of  Systems  Constitutional: Negative.   HENT: Negative.   Eyes: Negative.   Respiratory: Negative.   Cardiovascular: Negative.   Gastrointestinal: Positive for nausea and vomiting.  Genitourinary: Negative.   Musculoskeletal: Negative.   Skin: Negative.   Neurological: Positive for dizziness.  Endo/Heme/Allergies: Negative.   Psychiatric/Behavioral: Negative.     Blood pressure 149/37, pulse 66, temperature 97.5 F (36.4 C), resp. rate 16, SpO2 93.00%. Physical Exam  Constitutional: She is oriented to person, place, and time. She appears well-developed and well-nourished. No distress.  HENT:  Head: Normocephalic and atraumatic.  Right Ear: External ear normal.  Left Ear: External ear normal.  Nose: Nose normal.  Mouth/Throat: Oropharynx is clear and moist. No oropharyngeal exudate.  Eyes: Conjunctivae normal are normal. Pupils are equal, round, and reactive to light. Right eye exhibits no discharge. Left eye exhibits no discharge. No scleral icterus.  Neck: Normal range of motion. Neck supple.  Cardiovascular: Normal rate and regular rhythm.   Respiratory: Effort normal and breath sounds normal. No respiratory distress. She has no wheezes. She has no rales.  GI: Soft. Bowel sounds are normal. She exhibits no distension. There is no tenderness. There is no rebound.  Musculoskeletal: She exhibits no edema and no tenderness.  Neurological: She is alert and oriented to person, place, and time.       Moves all extremities.  Skin: Skin is warm and dry. She is not diaphoretic.     Assessment/Plan #1. Dizziness with nausea and vomiting - symptoms are concerning for CVA versus benign positional vertigo. Check MRI brain and if positive for stroke pursued further workup for stroke. Patient's abdominal exam is benign. Given the headache check sed rate. #2. Hypertension - we'll hold HCTZ continue other medication. Gently hydrate. If stroke is negative continue HCTZ. #3. Hyperlipidemia -  continue present medications. #4. Chronic anemia - continue present medications. #5. History of hyperthyroidism status post ablation - check TSH.  CODE STATUS - full code.  Christopher Glasscock N. 08/26/2012, 12:21 AM

## 2012-08-27 DIAGNOSIS — D649 Anemia, unspecified: Secondary | ICD-10-CM

## 2012-08-27 LAB — GLUCOSE, CAPILLARY
Glucose-Capillary: 101 mg/dL — ABNORMAL HIGH (ref 70–99)
Glucose-Capillary: 109 mg/dL — ABNORMAL HIGH (ref 70–99)

## 2012-08-27 MED ORDER — MECLIZINE HCL 25 MG PO TABS: 25.0000 mg | ORAL_TABLET | Freq: Three times a day (TID) | ORAL | Status: DC | PRN

## 2012-08-27 NOTE — Discharge Summary (Signed)
PATIENT DETAILS Name: Rebekah Cross Age: 78 y.o. Sex: female Date of Birth: [birth year] MRN: [id number]. Admit Date: 08/25/2012 Admitting Physician: Eduard Clos, MD [id number], MD  Recommendations for Outpatient Follow-up:  1. Outpatient work up for anemia  PRIMARY DISCHARGE DIAGNOSIS:  Principal Problem:  *Dizziness Active Problems:  HTN (hypertension)  Nausea and vomiting  Anemia      PAST MEDICAL HISTORY: Past Medical History  Diagnosis Date  . Anemia, unspecified   . Hypertension   . Thyroid disease   . Hypercholesteremia     DISCHARGE MEDICATIONS:   Medication List     As of 08/27/2012  9:24 AM    TAKE these medications         acitretin 25 MG capsule   Commonly known as: SORIATANE   Take 25 mg by mouth every Monday, Wednesday, and Friday. Takes at night      aspirin 81 MG tablet   Take 81 mg by mouth daily.      Azelastine-Fluticasone 137-50 MCG/ACT Susp   Place 1 spray into the nose 2 (two) times daily as needed. For nasal congestion      cholecalciferol 400 UNITS Tabs   Commonly known as: VITAMIN D   Take 400 Units by mouth daily.      LORazepam 0.5 MG tablet   Commonly known as: ATIVAN   Take 0.5-1 mg by mouth at bedtime.      meclizine 25 MG tablet   Commonly known as: ANTIVERT   Take 1 tablet (25 mg total) by mouth 3 (three) times daily as needed for dizziness.      metoprolol succinate 100 MG 24 hr tablet   Commonly known as: TOPROL-XL   Take 100 mg by mouth daily. Take with or immediately following a meal.      mometasone 0.1 % cream   Commonly known as: ELOCON   Apply 1 application topically daily as needed. For psoriasis      multivitamin tablet   Take 1 tablet by mouth daily.      nisoldipine 25.5 MG 24 hr tablet   Commonly known as: SULAR   Take 25.5 mg by mouth daily.      olmesartan-hydrochlorothiazide 40-25 MG per tablet   Commonly known as: BENICAR HCT   Take 1 tablet by mouth daily.      potassium  chloride SA 20 MEQ tablet   Commonly known as: K-DUR,KLOR-CON   Take 20 mEq by mouth daily.      pravastatin 40 MG tablet   Commonly known as: PRAVACHOL   Take 40 mg by mouth at bedtime.         BRIEF HPI:  See H&P, Labs, Consult and Test reports for all details in brief, patient was admitted for for vertigo with nausea and vomiting.  CONSULTATIONS:   None  PERTINENT RADIOLOGIC STUDIES: Ct Head Wo Contrast  08/25/2012  *RADIOLOGY REPORT*  Clinical Data: Dizziness.  Nausea and emesis  CT HEAD WITHOUT CONTRAST  Technique:  Contiguous axial images were obtained from the base of the skull through the vertex without contrast.  Comparison: None.  Findings: There is mild low density within the subcortical and periventricular white matter consistent with mild small vessel ischemic change.  The brain has an otherwise normal appearance without evidence for hemorrhage, infarction, hydrocephalus, or mass lesion.  There is no extra axial fluid collection.  The skull and paranasal sinuses are normal.  IMPRESSION:  1.  No acute intracranial abnormalities. 2.  Mild  small vessel ischemic change.   Original Report Authenticated By: Signa Kell, M.D.    Mri Brain Without Contrast  08/26/2012  *RADIOLOGY REPORT*  Clinical Data: 78 year old female dizziness nausea and vomiting.  MRI HEAD WITHOUT CONTRAST  Technique:  Multiplanar, multiecho pulse sequences of the brain and surrounding structures were obtained according to standard protocol without intravenous contrast.  Comparison: Head CT 08/25/2012.  Findings: Cerebral volume is within normal limits for age.  Mildly heterogeneous diffusion imaging, but no convincing restricted diffusion to suggest acute infarct. No midline shift, ventriculomegaly, mass effect, evidence of mass lesion, extra-axial collection or acute intracranial hemorrhage.  Cervicomedullary junction and pituitary are within normal limits.  Major intracranial vascular flow voids are  preserved.  Scattered minimal to mild for age cerebral white matter T2 and FLAIR hyperintensity. No cortical encephalomalacia.  Deep gray matter nuclei, brainstem and cerebellum are within normal limits for age.  Degenerative changes in the cervical spine, otherwise negative cervical spine for age.  Grossly normal visualized internal auditory structures.  Mastoids are clear.  Occasional small paranasal sinus mucous retention cysts.  Postoperative changes to the orbits.  Negative scalp soft tissues.  Decreased T1 marrow signal throughout the visualized osseous structures of the skull and cervical spine.  No overtly destructive osseous lesion.  Recent complete blood count reveals decreased hemoglobin and hematocrit level.  IMPRESSION: 1. No acute intracranial abnormality.  Largely unremarkable for age noncontrast MRI appearance the brain. 2.  Diffusely abnormal for age but nonspecific bone marrow signal. This may relate to a diffuse red marrow reactivation given the patient's anemia, but infiltrative marrow processes also are possible.   Original Report Authenticated By: Erskine Speed, M.D.      PERTINENT LAB RESULTS: CBC:  Basename 08/26/12 0625 08/25/12 1747  WBC 4.4 4.0  HGB 8.8* 10.0*  HCT 26.6* 30.0*  PLT 281 294   CMET CMP     Component Value Date/Time   NA 143 08/26/2012 0625   K 3.2* 08/26/2012 0625   CL 106 08/26/2012 0625   CO2 28 08/26/2012 0625   GLUCOSE 103* 08/26/2012 0625   BUN 17 08/26/2012 0625   CREATININE 0.78 08/26/2012 0625   CALCIUM 9.1 08/26/2012 0625   PROT 7.1 08/25/2012 1747   ALBUMIN 4.3 08/25/2012 1747   AST 17 08/25/2012 1747   ALT 17 08/25/2012 1747   ALKPHOS 66 08/25/2012 1747   BILITOT 0.8 08/25/2012 1747   GFRNONAA 74* 08/26/2012 0625   GFRAA 86* 08/26/2012 0625    GFR Estimated Creatinine Clearance: 41 ml/min (by C-G formula based on Cr of 0.78).  Basename 08/25/12 1747  LIPASE 13  AMYLASE --    Basename 08/25/12 2043 08/25/12 1748  CKTOTAL --  --  CKMB -- --  CKMBINDEX -- --  TROPONINI <0.30 <0.30   No components found with this basename: POCBNP:3 No results found for this basename: DDIMER:2 in the last 72 hours  Basename 08/26/12 0500  HGBA1C 5.7*    Basename 08/26/12 0625  CHOL 116  HDL 44  LDLCALC 52  TRIG 102  CHOLHDL 2.6  LDLDIRECT --    Basename 08/26/12 0625  TSH 0.120*  T4TOTAL --  T3FREE --  THYROIDAB --   No results found for this basename: VITAMINB12:2,FOLATE:2,FERRITIN:2,TIBC:2,IRON:2,RETICCTPCT:2 in the last 72 hours Coags: No results found for this basename: PT:2,INR:2 in the last 72 hours Microbiology: No results found for this or any previous visit (from the past 240 hour(s)).   BRIEF HOSPITAL COURSE:   Principal Problem:  Dizziness  -suspect peripheral vertigo  -MRI brain neg -this has completely resolved, patient now ambulating independently in the hallway -seen by PT-outpatient vestibular PT recommended and has been arranged -will discharge on prn Meclizine  Nausea and vomiting  -2/2 above  -resolved  -tolerating regular diet   HTN (hypertension)  -stable with moderate control  -continue Avapro, Metoprolol and Sular   Anemia  -mild decrease in Hb following hydration  -suspect chronic issue-will defer w/u to the outpatient setting   TODAY-DAY OF DISCHARGE:  Subjective:   Rebekah Cross today has no headache,no chest abdominal pain,no new weakness tingling or numbness, feels much better wants to go home today. She has ambulated in the hallway and is anxious to go home.  Objective:   Blood pressure 140/49, pulse 61, temperature 98.5 F (36.9 C), temperature source Oral, resp. rate 18, height 5' (1.524 m), weight 58 kg (127 lb 13.9 oz), SpO2 95.00%.  Intake/Output Summary (Last 24 hours) at 08/27/12 0924 Last data filed at 08/27/12 0900  Gross per 24 hour  Intake    530 ml  Output      0 ml  Net    530 ml   Exam Awake Alert, Oriented *3, No new F.N deficits, Normal  affect [place].AT,PERRAL Supple Neck,No JVD, No cervical lymphadenopathy appriciated.  Symmetrical Chest wall movement, Good air movement bilaterally, CTAB RRR,No Gallops,Rubs or new Murmurs, No Parasternal Heave +ve B.Sounds, Abd Soft, Non tender, No organomegaly appriciated, No rebound -guarding or rigidity. No Cyanosis, Clubbing or edema, No new Rash or bruise  DISCHARGE CONDITION: Stable  DISPOSITION: HOME  DISCHARGE INSTRUCTIONS:    Activity:  As tolerated with Full fall precautions use walker/cane & assistance as needed  Diet recommendation: Heart Healthy diet      Follow-up Information    Follow up with SHAW,KIMBERLEE, MD. Schedule an appointment as soon as possible for a visit in 1 week.   Contact information:   [address] [place] [address] [phone]         Total Time spent on discharge equals 45 minutes.  SignedJeoffrey Massed 08/27/2012 9:24 AM

## 2012-08-27 NOTE — Progress Notes (Signed)
Pt d/c home.  Daughter will remain with pt 24hr/7 days a week.  Pt in no distress.  All d/c instructions given to pt and daughter.

## 2012-08-29 NOTE — ED Provider Notes (Signed)
  I performed a history and physical examination of Rebekah Cross and discussed her management with Dr. Freida Busman.  I agree with the history, physical, assessment, and plan of care, with the following exceptions: None  On my exam this pleasant elderly female was in no distress.  Though the patient's labs and imaging were largely unremarkable, given the child description of her symptoms, her age, she was admitted for further evaluation and management.  I saw the ECG, relevant labs and studies - I agree with the interpretation.   Date: 08/29/2012  Rate: 63  Rhythm: normal sinus rhythm  QRS Axis: normal  Intervals: normal  ST/T Wave abnormalities: normal  Conduction Disutrbances: none  Narrative Interpretation: unremarkable      Metha Kolasa, Elvis Coil, MD 08/29/12 [phone]

## 2013-03-06 ENCOUNTER — Other Ambulatory Visit: Payer: Self-pay | Admitting: Internal Medicine

## 2013-03-06 DIAGNOSIS — E042 Nontoxic multinodular goiter: Secondary | ICD-10-CM

## 2013-03-06 DIAGNOSIS — E0590 Thyrotoxicosis, unspecified without thyrotoxic crisis or storm: Secondary | ICD-10-CM

## 2013-03-20 ENCOUNTER — Ambulatory Visit (HOSPITAL_COMMUNITY): Payer: Medicare Other

## 2013-03-21 ENCOUNTER — Other Ambulatory Visit (HOSPITAL_COMMUNITY): Payer: Medicare Other

## 2013-03-23 ENCOUNTER — Encounter (HOSPITAL_COMMUNITY)
Admission: RE | Admit: 2013-03-23 | Discharge: 2013-03-23 | Disposition: A | Discharge status: Home / Self Care | Payer: Medicare Other | Source: Ambulatory Visit | Attending: Internal Medicine | Admitting: Internal Medicine

## 2013-03-23 DIAGNOSIS — E042 Nontoxic multinodular goiter: Secondary | ICD-10-CM | POA: Insufficient documentation

## 2013-03-23 DIAGNOSIS — E0590 Thyrotoxicosis, unspecified without thyrotoxic crisis or storm: Secondary | ICD-10-CM | POA: Insufficient documentation

## 2013-03-24 ENCOUNTER — Encounter (HOSPITAL_COMMUNITY)
Admission: RE | Admit: 2013-03-24 | Discharge: 2013-03-24 | Disposition: A | Discharge status: Home / Self Care | Payer: Medicare Other | Source: Ambulatory Visit | Attending: Internal Medicine | Admitting: Internal Medicine

## 2013-03-24 ENCOUNTER — Encounter (HOSPITAL_COMMUNITY): Payer: Self-pay

## 2013-03-24 DIAGNOSIS — E042 Nontoxic multinodular goiter: Secondary | ICD-10-CM | POA: Insufficient documentation

## 2013-03-24 DIAGNOSIS — E0590 Thyrotoxicosis, unspecified without thyrotoxic crisis or storm: Secondary | ICD-10-CM | POA: Insufficient documentation

## 2013-03-24 MED ORDER — SODIUM PERTECHNETATE TC 99M INJECTION
10.3000 | Freq: Once | INTRAVENOUS | Status: AC | PRN
  Administered 2013-03-24: 10.3 via INTRAVENOUS

## 2013-03-24 MED ORDER — SODIUM IODIDE I 131 CAPSULE
10.4000 | Freq: Once | INTRAVENOUS | Status: AC | PRN
  Administered 2013-03-24: 10.4 via ORAL

## 2013-03-27 ENCOUNTER — Ambulatory Visit (HOSPITAL_COMMUNITY): Payer: Medicare Other

## 2013-03-30 ENCOUNTER — Encounter (HOSPITAL_COMMUNITY): Payer: Self-pay

## 2013-03-30 ENCOUNTER — Encounter (HOSPITAL_COMMUNITY)
Admission: RE | Admit: 2013-03-30 | Discharge: 2013-03-30 | Disposition: A | Discharge status: Home / Self Care | Payer: Medicare Other | Source: Ambulatory Visit | Attending: Internal Medicine | Admitting: Internal Medicine

## 2013-03-30 DIAGNOSIS — E042 Nontoxic multinodular goiter: Secondary | ICD-10-CM | POA: Insufficient documentation

## 2013-03-30 DIAGNOSIS — E0590 Thyrotoxicosis, unspecified without thyrotoxic crisis or storm: Secondary | ICD-10-CM

## 2013-03-30 MED ORDER — SODIUM IODIDE I 131 CAPSULE
35.9000 | Freq: Once | INTRAVENOUS | Status: AC | PRN
  Administered 2013-03-30: 35.9 via ORAL

## 2013-04-28 ENCOUNTER — Encounter (HOSPITAL_COMMUNITY): Payer: Self-pay

## 2013-04-28 ENCOUNTER — Observation Stay (HOSPITAL_COMMUNITY)
Admission: EM | Admit: 2013-04-28 | Discharge: 2013-04-29 | Disposition: A | Discharge status: Home / Self Care | Payer: Medicare Other | Attending: Internal Medicine | Admitting: Internal Medicine

## 2013-04-28 DIAGNOSIS — R42 Dizziness and giddiness: Secondary | ICD-10-CM

## 2013-04-28 DIAGNOSIS — E0590 Thyrotoxicosis, unspecified without thyrotoxic crisis or storm: Secondary | ICD-10-CM

## 2013-04-28 DIAGNOSIS — D649 Anemia, unspecified: Principal | ICD-10-CM | POA: Insufficient documentation

## 2013-04-28 DIAGNOSIS — R5381 Other malaise: Secondary | ICD-10-CM | POA: Insufficient documentation

## 2013-04-28 DIAGNOSIS — I10 Essential (primary) hypertension: Secondary | ICD-10-CM | POA: Diagnosis present

## 2013-04-28 DIAGNOSIS — E7800 Pure hypercholesterolemia, unspecified: Secondary | ICD-10-CM | POA: Diagnosis present

## 2013-04-28 DIAGNOSIS — D630 Anemia in neoplastic disease: Secondary | ICD-10-CM | POA: Diagnosis present

## 2013-04-28 DIAGNOSIS — R5383 Other fatigue: Secondary | ICD-10-CM | POA: Insufficient documentation

## 2013-04-28 LAB — CBC WITH DIFFERENTIAL/PLATELET
Band Neutrophils: 0 % (ref 0–10)
Basophils Absolute: 0.1 10*3/uL (ref 0.0–0.1)
Basophils Relative: 2 % — ABNORMAL HIGH (ref 0–1)
Eosinophils Absolute: 0.7 10*3/uL (ref 0.0–0.7)
Eosinophils Relative: 16 % — ABNORMAL HIGH (ref 0–5)
HCT: 20 % — ABNORMAL LOW (ref 36.0–46.0)
Hemoglobin: 6.8 g/dL — CL (ref 12.0–15.0)
Lymphs Abs: 0.5 10*3/uL — ABNORMAL LOW (ref 0.7–4.0)
MCH: 30.9 pg (ref 26.0–34.0)
MCHC: 34 g/dL (ref 30.0–36.0)
Myelocytes: 0 %
Neutro Abs: 2.7 10*3/uL (ref 1.7–7.7)
Neutrophils Relative %: 63 % (ref 43–77)
RBC: 2.2 MIL/uL — ABNORMAL LOW (ref 3.87–5.11)

## 2013-04-28 LAB — POCT I-STAT, CHEM 8
Chloride: 111 mEq/L (ref 96–112)
Glucose, Bld: 156 mg/dL — ABNORMAL HIGH (ref 70–99)
HCT: 20 % — ABNORMAL LOW (ref 36.0–46.0)
Hemoglobin: 6.8 g/dL — CL (ref 12.0–15.0)
Potassium: 3.7 mEq/L (ref 3.5–5.1)
Sodium: 138 mEq/L (ref 135–145)

## 2013-04-28 MED ORDER — NISOLDIPINE ER 25.5 MG PO TB24
25.5000 mg | ORAL_TABLET | Freq: Every day | ORAL | Status: DC
  Administered 2013-04-29: 25.5 mg via ORAL
  Filled 2013-04-28: qty 1

## 2013-04-28 MED ORDER — METOPROLOL SUCCINATE ER 100 MG PO TB24
100.0000 mg | ORAL_TABLET | Freq: Every day | ORAL | Status: DC
  Administered 2013-04-29: 100 mg via ORAL
  Filled 2013-04-28: qty 1

## 2013-04-28 MED ORDER — MECLIZINE HCL 25 MG PO TABS
25.0000 mg | ORAL_TABLET | Freq: Three times a day (TID) | ORAL | Status: DC | PRN
  Filled 2013-04-28: qty 1

## 2013-04-28 MED ORDER — SODIUM CHLORIDE 0.9 % IJ SOLN
3.0000 mL | Freq: Two times a day (BID) | INTRAMUSCULAR | Status: DC
  Administered 2013-04-29: 3 mL via INTRAVENOUS

## 2013-04-28 MED ORDER — IRBESARTAN 300 MG PO TABS
300.0000 mg | ORAL_TABLET | Freq: Every day | ORAL | Status: DC
  Administered 2013-04-29: 300 mg via ORAL
  Filled 2013-04-28: qty 1

## 2013-04-28 MED ORDER — OLMESARTAN MEDOXOMIL-HCTZ 40-25 MG PO TABS: 1.0000 | ORAL_TABLET | Freq: Every day | ORAL | Status: DC

## 2013-04-28 MED ORDER — SIMVASTATIN 20 MG PO TABS
20.0000 mg | ORAL_TABLET | Freq: Every day | ORAL | Status: DC
  Filled 2013-04-28: qty 1

## 2013-04-28 MED ORDER — ASPIRIN EC 81 MG PO TBEC
81.0000 mg | DELAYED_RELEASE_TABLET | Freq: Every day | ORAL | Status: DC
  Administered 2013-04-29: 81 mg via ORAL
  Filled 2013-04-28: qty 1

## 2013-04-28 MED ORDER — SODIUM CHLORIDE 0.9 % IV SOLN: 250.0000 mL | INTRAVENOUS | Status: DC | PRN

## 2013-04-28 MED ORDER — ACITRETIN 25 MG PO CAPS: 25.0000 mg | ORAL_CAPSULE | ORAL | Status: DC

## 2013-04-28 MED ORDER — AZELASTINE-FLUTICASONE 137-50 MCG/ACT NA SUSP: 1.0000 | Freq: Two times a day (BID) | NASAL | Status: DC | PRN

## 2013-04-28 MED ORDER — HYDROCHLOROTHIAZIDE 25 MG PO TABS
25.0000 mg | ORAL_TABLET | Freq: Every day | ORAL | Status: DC
  Administered 2013-04-29: 25 mg via ORAL
  Filled 2013-04-28: qty 1

## 2013-04-28 MED ORDER — SODIUM CHLORIDE 0.9 % IJ SOLN: 3.0000 mL | INTRAMUSCULAR | Status: DC | PRN

## 2013-04-28 MED ORDER — POTASSIUM CHLORIDE CRYS ER 20 MEQ PO TBCR
20.0000 meq | EXTENDED_RELEASE_TABLET | Freq: Every day | ORAL | Status: DC
  Administered 2013-04-29: 20 meq via ORAL
  Filled 2013-04-28: qty 1

## 2013-04-28 MED ORDER — LORAZEPAM 0.5 MG PO TABS
0.5000 mg | ORAL_TABLET | Freq: Every day | ORAL | Status: DC
  Administered 2013-04-29: 0.5 mg via ORAL
  Filled 2013-04-28: qty 1

## 2013-04-28 NOTE — ED Notes (Addendum)
Dr. [facility] sent pt to [place] for a Blood transfusion H & H is a 6.2. Pt. Is weak , fatiqued, headache , lower back pain.   Pt. Has been nauseated, but not at the present time  Skin is pale warm and dry.   Pt. Reports not seeing any blood in her bowels or urine

## 2013-04-28 NOTE — ED Provider Notes (Signed)
CSN: [id number]     Arrival date & time 04/28/13  1754 History     First MD Initiated Contact with Patient 04/28/13 1854     Chief Complaint  Patient presents with  . Labs Only   (Consider location/radiation/quality/duration/timing/severity/associated sxs/prior Treatment) HPI Comments: 79 y/o female comes in with cc of low Hb. Pt had routine pcp visit, found to have hb of 6.1, she was asked to come to the ER. She denies blood loss from any orifice. No renal dz. Admits to getting tired easily.  The history is provided by the patient.    Past Medical History  Diagnosis Date  . Anemia, unspecified   . Hypertension   . Thyroid disease   . Hypercholesteremia    Past Surgical History  Procedure Laterality Date  . Abdominal hysterectomy    . Back surgery    . Arthroscopy right knee    . Arthroscopy left knee     Family History  Problem Relation Age of Onset  . Colon cancer Daughter   . Heart disease Maternal Grandmother   . Heart disease Paternal Grandfather    History  Substance Use Topics  . Smoking status: Never Smoker   . Smokeless tobacco: Never Used  . Alcohol Use: No   OB History   Grav Para Term Preterm Abortions TAB SAB Ect Mult Living                 Review of Systems  Constitutional: Positive for fatigue. Negative for activity change.  HENT: Negative for neck pain.   Respiratory: Negative for shortness of breath.   Cardiovascular: Negative for chest pain.  Gastrointestinal: Negative for nausea, vomiting and abdominal pain.  Genitourinary: Negative for dysuria.  Neurological: Positive for weakness. Negative for headaches.    Allergies  Codeine; Oxycodone; and Methimazole  Home Medications   Current Outpatient Rx  Name  Route  Sig  Dispense  Refill  . acitretin (SORIATANE) 25 MG capsule   Oral   Take 25 mg by mouth every Monday, Wednesday, and Friday.          Marland Kitchen aspirin 81 MG tablet   Oral   Take 81 mg by mouth daily.           .  Azelastine-Fluticasone 137-50 MCG/ACT SUSP   Nasal   Place 1 spray into the nose 2 (two) times daily as needed. For nasal congestion         . cholecalciferol (VITAMIN D) 400 UNITS TABS   Oral   Take 400 Units by mouth daily.         Marland Kitchen LORazepam (ATIVAN) 0.5 MG tablet   Oral   Take 0.5-1 mg by mouth at bedtime.          . meclizine (ANTIVERT) 25 MG tablet   Oral   Take 1 tablet (25 mg total) by mouth 3 (three) times daily as needed for dizziness.   20 tablet   0   . metoprolol succinate (TOPROL-XL) 100 MG 24 hr tablet   Oral   Take 100 mg by mouth daily.          . mometasone (ELOCON) 0.1 % cream   Topical   Apply 1 application topically daily as needed. For psoriasis         . Multiple Vitamin (MULTIVITAMIN) tablet   Oral   Take 1 tablet by mouth daily.           . nisoldipine (SULAR) 25.5 MG 24  hr tablet   Oral   Take 25.5 mg by mouth daily.           Marland Kitchen olmesartan-hydrochlorothiazide (BENICAR HCT) 40-25 MG per tablet   Oral   Take 1 tablet by mouth daily.           . potassium chloride SA (K-DUR,KLOR-CON) 20 MEQ tablet   Oral   Take 20 mEq by mouth daily.         . pravastatin (PRAVACHOL) 40 MG tablet   Oral   Take 40 mg by mouth at bedtime.           BP 149/62  Pulse 75  Temp(Src) 98.6 F (37 C) (Oral)  Resp 14  SpO2 95% Physical Exam  Nursing note and vitals reviewed. Constitutional: She is oriented to person, place, and time. She appears well-developed and well-nourished.  HENT:  Head: Normocephalic and atraumatic.  Eyes: EOM are normal. Pupils are equal, round, and reactive to light.  Neck: Neck supple.  Cardiovascular: Normal rate, regular rhythm and normal heart sounds.   No murmur heard. Pulmonary/Chest: Effort normal. No respiratory distress.  Abdominal: Soft. She exhibits no distension. There is no tenderness. There is no rebound and no guarding.  guaiac neg stools  Neurological: She is alert and oriented to person,  place, and time.  Skin: Skin is warm and dry.    ED Course   Procedures (including critical care time)  Labs Reviewed  CBC WITH DIFFERENTIAL - Abnormal; Notable for the following:    RBC 2.20 (*)    Hemoglobin 6.8 (*)    HCT 20.0 (*)    RDW 22.6 (*)    Eosinophils Relative 16 (*)    Basophils Relative 2 (*)    Lymphs Abs 0.5 (*)    All other components within normal limits  POCT I-STAT, CHEM 8 - Abnormal; Notable for the following:    BUN 25 (*)    Glucose, Bld 156 (*)    Calcium, Ion 0.90 (*)    Hemoglobin 6.8 (*)    HCT 20.0 (*)    All other components within normal limits  TYPE AND SCREEN  PREPARE RBC (CROSSMATCH)  ABO/RH   No results found. No diagnosis found.  MDM  Pt comes in with cc of anemia. Pt has no significant medical hx. Denies blood loss. Pt is symptomatic, will need transfusion. Unsure what the cause is at this time for her blood loss.  CRITICAL CARE Performed by: Derwood Kaplan   Total critical care time: 35 minutes. Symptomatic anemia requiring transfusion  Critical care time was exclusive of separately billable procedures and treating other patients.  Critical care was necessary to treat or prevent imminent or life-threatening deterioration.  Critical care was time spent personally by me on the following activities: development of treatment plan with patient and/or surrogate as well as nursing, discussions with consultants, evaluation of patient's response to treatment, examination of patient, obtaining history from patient or surrogate, ordering and performing treatments and interventions, ordering and review of laboratory studies, ordering and review of radiographic studies, pulse oximetry and re-evaluation of patient's condition.   Derwood Kaplan, MD 04/28/13 2140

## 2013-04-29 DIAGNOSIS — R42 Dizziness and giddiness: Secondary | ICD-10-CM

## 2013-04-29 LAB — CBC
HCT: 28.4 % — ABNORMAL LOW (ref 36.0–46.0)
MCV: 87.9 fL (ref 78.0–100.0)
RDW: 19.1 % — ABNORMAL HIGH (ref 11.5–15.5)
WBC: 4.8 10*3/uL (ref 4.0–10.5)

## 2013-04-29 LAB — BASIC METABOLIC PANEL
BUN: 22 mg/dL (ref 6–23)
CO2: 27 mEq/L (ref 19–32)
Chloride: 106 mEq/L (ref 96–112)
Creatinine, Ser: 0.68 mg/dL (ref 0.50–1.10)
GFR calc Af Amer: 89 mL/min — ABNORMAL LOW (ref >90)

## 2013-04-29 NOTE — Progress Notes (Signed)
MD stated pt was to receive 2 units of blood. One unit of blood was started in the ED & is now complete. Preparing to start the second unit of blood.

## 2013-04-29 NOTE — Progress Notes (Signed)
Pt admitted to unit from ED. Pt is A&O, VS stable, & skin intact. Pt currently has a unit of blood transfusing that was started in ED. Pt's family is currently in waiting area. Pt is now resting comfortably in bed w/ call bell within reach. Will continue to monitor.

## 2013-04-29 NOTE — Discharge Summary (Signed)
Physician Discharge Summary  Rebekah Cross ZOX:[id number] DOB: 1925/06/07 DOA: 04/28/2013  PCP: Lupita Raider, MD  Admit date: 04/28/2013 Discharge date: 04/29/2013  Time spent: 35 minutes minutes  Recommendations for Outpatient Follow-up:  1. Followup with Dr. Clelia Croft within 1 week  Discharge Diagnoses:  Active Problems:   HTN (hypertension)   Hypercholesteremia   Anemia   Hyperthyroidism   Discharge Condition: Stable  Diet recommendation: Heart healthy  Filed Weights   04/29/13 0129  Weight: 55.2 kg (121 lb 11.1 oz)    History of present illness:  Rebekah Cross is an 79 y.o. female with hx of chronic anemia (Hb 8 to 10 grams/DL), hx of hyperthyrodism, HTN, hyperlipidemia, sent into the ER by her PCP as routine lab showed Hb of 6.8g/DL. She is rather asymptomatic except for feeling fatigue. She has no black or bloody stool, no easy bruising, no epistaxis, and not in renal failure. She had colonoscopy in Jan 14 only showed diverticulosis. Hospitalist was asked to admit her for transfusion.  Hospital Course:   1. Anemia: Patient does have chronic anemia with hemoglobin baseline of 8-10. She follows with Dr. Clelia Croft as outpatient, the acute decline in the hemoglobin is of uncertain etiology, she was presented with hemoglobin of 6.8, received transfusion of 2 units of packed RBCs and hemoglobin increased to 9.7. Patient is not microcytic, she's not on NSAIDs, she had colonoscopy about 5 months ago and did not show any abnormalities. Patient wanted to followup with Dr. Clelia Croft as outpatient to complete the workup for chronic anemia. Patient mentioned that some point she had low platelets and she had to have transfusion for that 2. Platelets today is normal.  2. Hypertension: Preadmission home medication continued throughout the hospital stay.  3. Hypercholesterolemia: Stable, no changes in medications.  Procedures:  Transfusion 2 units of packed RBCs  Consultations:  Stable  Discharge  Exam: Filed Vitals:   04/29/13 0230 04/29/13 0330 04/29/13 0358 04/29/13 0537  BP: 167/64 163/65 170/67 142/54  Pulse: 81 77 73 75  Temp: 98.4 F (36.9 C) 98.5 F (36.9 C) 98.6 F (37 C) 98.5 F (36.9 C)  TempSrc: Oral Oral Oral Oral  Resp: 20 20 20 18   Height:      Weight:      SpO2:    91%  General: Alert and awake, oriented x3, not in any acute distress. HEENT: anicteric sclera, pupils reactive to light and accommodation, EOMI CVS: S1-S2 clear, no murmur rubs or gallops Chest: clear to auscultation bilaterally, no wheezing, rales or rhonchi Abdomen: soft nontender, nondistended, normal bowel sounds, no organomegaly Extremities: no cyanosis, clubbing or edema noted bilaterally Neuro: Cranial nerves II-XII intact, no focal neurological deficits  Discharge Instructions  Discharge Orders   Future Orders Complete By Expires     Diet - low sodium heart healthy  As directed     Increase activity slowly  As directed         Medication List         acitretin 25 MG capsule  Commonly known as:  SORIATANE  Take 25 mg by mouth every Monday, Wednesday, and Friday.     aspirin 81 MG tablet  Take 81 mg by mouth daily.     Azelastine-Fluticasone 137-50 MCG/ACT Susp  Place 1 spray into the nose 2 (two) times daily as needed. For nasal congestion     cholecalciferol 400 UNITS Tabs  Commonly known as:  VITAMIN D  Take 400 Units by mouth daily.  LORazepam 0.5 MG tablet  Commonly known as:  ATIVAN  Take 0.5-1 mg by mouth at bedtime.     meclizine 25 MG tablet  Commonly known as:  ANTIVERT  Take 1 tablet (25 mg total) by mouth 3 (three) times daily as needed for dizziness.     metoprolol succinate 100 MG 24 hr tablet  Commonly known as:  TOPROL-XL  Take 100 mg by mouth daily.     mometasone 0.1 % cream  Commonly known as:  ELOCON  Apply 1 application topically daily as needed. For psoriasis     multivitamin tablet  Take 1 tablet by mouth daily.     nisoldipine 25.5  MG 24 hr tablet  Commonly known as:  SULAR  Take 25.5 mg by mouth daily.     olmesartan-hydrochlorothiazide 40-25 MG per tablet  Commonly known as:  BENICAR HCT  Take 1 tablet by mouth daily.     potassium chloride SA 20 MEQ tablet  Commonly known as:  K-DUR,KLOR-CON  Take 20 mEq by mouth daily.     pravastatin 40 MG tablet  Commonly known as:  PRAVACHOL  Take 40 mg by mouth at bedtime.       Allergies  Allergen Reactions  . Codeine   . Oxycodone Nausea And Vomiting and Other (See Comments)    "jerking"  . Methimazole (Thiamazole) Rash      The results of significant diagnostics from this hospitalization (including imaging, microbiology, ancillary and laboratory) are listed below for reference.    Significant Diagnostic Studies: Nm Rai Therapy For Hyperthyroidism  03/30/2013   *RADIOLOGY REPORT*  Clinical Data: Hyperthyroidism  NUCLEAR MEDICINE RADIOACTIVE IODINE THERAPY FOR HYPERTHYROIDISM  Technique:  The risks and benefits of radioactive iodine therapy were discussed with the patient in detail. Alternative therapies were also mentioned. Radiation safety was discussed with the patient, including how to protect the general public from exposure. There were no barriers to communication.  Written consent was obtained.  The patient then received a capsule containing the radiopharmaceutical.  The patient will follow-up with the referring physician.  Radiopharmaceutical: 35.9MILLI CURIE I-131 SODIUM IODIDE I 131 CAPSULE  Comparison: None.  IMPRESSION: Per oral administration of radiolabeled iodine for the treatment of hyperthyroidism due to multinodular goiter.   Original Report Authenticated By: Signa Kell, M.D.    Microbiology: No results found for this or any previous visit (from the past 240 hour(s)).   Labs: Basic Metabolic Panel:  Recent Labs Lab 04/28/13 1844 04/29/13 0532  NA 138 141  K 3.7 4.4  CL 111 106  CO2  --  27  GLUCOSE 156* 126*  BUN 25* 22   CREATININE 0.90 0.68  CALCIUM  --  9.7   Liver Function Tests: No results found for this basename: AST, ALT, ALKPHOS, BILITOT, PROT, ALBUMIN,  in the last 168 hours No results found for this basename: LIPASE, AMYLASE,  in the last 168 hours No results found for this basename: AMMONIA,  in the last 168 hours CBC:  Recent Labs Lab 04/28/13 1809 04/28/13 1844 04/29/13 0532  WBC 4.3  --  4.8  NEUTROABS 2.7  --   --   HGB 6.8* 6.8* 9.7*  HCT 20.0* 20.0* 28.4*  MCV 90.9  --  87.9  PLT 338  --  287   Cardiac Enzymes: No results found for this basename: CKTOTAL, CKMB, CKMBINDEX, TROPONINI,  in the last 168 hours BNP: BNP (last 3 results) No results found for this basename: PROBNP,  in  the last 8760 hours CBG: No results found for this basename: GLUCAP,  in the last 168 hours     Signed:  Arely Tinner A  [facility] 04/29/2013, 12:49 PM

## 2013-04-29 NOTE — H&P (Signed)
[facility] History and Physical  Rebekah Cross ZOX:[id number] DOB: 06/30/1925    PCP:   Lupita Raider, MD   Chief Complaint: sent in to the ER for anemia.  HPI: Rebekah Cross is an 79 y.o. female with hx of chronic anemia (Hb 8 to 10 grams/DL), hx of hyperthyrodism, HTN, hyperlipidemia, sent into the ER by her PCP as routine lab showed Hb of 6.8g/DL.  She is rather asymptomatic except for feeling fatigue. She has no black or bloody stool, no easy bruising, no epistaxis, and not in renal failure.  She had colonoscopy in Jan 14 only showed diverticulosis.  Hospitalist was asked to admit her for transfusion.  Rewiew of Systems:  Constitutional: Negative for fever and chills. No significant weight loss or weight gain Eyes: Negative for eye pain, redness and discharge, diplopia, visual changes, or flashes of light. ENMT: Negative for ear pain, hoarseness, nasal congestion, sinus pressure and sore throat. No headaches; tinnitus, drooling, or problem swallowing. Cardiovascular: Negative for chest pain, palpitations, diaphoresis, dyspnea and peripheral edema. ; No orthopnea, PND Respiratory: Negative for cough, hemoptysis, wheezing and stridor. No pleuritic chestpain. Gastrointestinal: Negative for nausea, vomiting, diarrhea, constipation, abdominal pain, melena, blood in stool, hematemesis, jaundice and rectal bleeding.    Genitourinary: Negative for frequency, dysuria, incontinence,flank pain and hematuria; Musculoskeletal: Negative for back pain and neck pain. Negative for swelling and trauma.;  Skin: . Negative for pruritus, rash, abrasions, bruising and skin lesion.; ulcerations Neuro: Negative for headache, lightheadedness and neck stiffness. Negative for weakness, altered level of consciousness , altered mental status, extremity weakness, burning feet, involuntary movement, seizure and syncope.  Psych: negative for anxiety, depression, insomnia, tearfulness, panic attacks, hallucinations,  paranoia, suicidal or homicidal ideation.   Past Medical History  Diagnosis Date  . Anemia, unspecified   . Hypertension   . Thyroid disease   . Hypercholesteremia     Past Surgical History  Procedure Laterality Date  . Abdominal hysterectomy    . Back surgery    . Arthroscopy right knee    . Arthroscopy left knee      Medications:  HOME MEDS: Prior to Admission medications   Medication Sig Start Date End Date Taking? Authorizing Provider  acitretin (SORIATANE) 25 MG capsule Take 25 mg by mouth every Monday, Wednesday, and Friday.    Yes Historical Provider, MD  aspirin 81 MG tablet Take 81 mg by mouth daily.     Yes Historical Provider, MD  Azelastine-Fluticasone 137-50 MCG/ACT SUSP Place 1 spray into the nose 2 (two) times daily as needed. For nasal congestion   Yes Historical Provider, MD  cholecalciferol (VITAMIN D) 400 UNITS TABS Take 400 Units by mouth daily.   Yes Historical Provider, MD  LORazepam (ATIVAN) 0.5 MG tablet Take 0.5-1 mg by mouth at bedtime.    Yes Historical Provider, MD  meclizine (ANTIVERT) 25 MG tablet Take 1 tablet (25 mg total) by mouth 3 (three) times daily as needed for dizziness. 08/27/12  Yes Shanker Levora Dredge, MD  metoprolol succinate (TOPROL-XL) 100 MG 24 hr tablet Take 100 mg by mouth daily.    Yes Historical Provider, MD  mometasone (ELOCON) 0.1 % cream Apply 1 application topically daily as needed. For psoriasis   Yes Historical Provider, MD  Multiple Vitamin (MULTIVITAMIN) tablet Take 1 tablet by mouth daily.     Yes Historical Provider, MD  nisoldipine (SULAR) 25.5 MG 24 hr tablet Take 25.5 mg by mouth daily.     Yes Historical Provider, MD  olmesartan-hydrochlorothiazide (BENICAR HCT) 40-25 MG per tablet Take 1 tablet by mouth daily.     Yes Historical Provider, MD  potassium chloride SA (K-DUR,KLOR-CON) 20 MEQ tablet Take 20 mEq by mouth daily.   Yes Historical Provider, MD  pravastatin (PRAVACHOL) 40 MG tablet Take 40 mg by mouth at  bedtime.    Yes Historical Provider, MD     Allergies:  Allergies  Allergen Reactions  . Codeine   . Oxycodone Nausea And Vomiting and Other (See Comments)    "jerking"  . Methimazole (Thiamazole) Rash    Social History:   reports that she has never smoked. She has never used smokeless tobacco. She reports that she does not drink alcohol or use illicit drugs.  Family History: Family History  Problem Relation Age of Onset  . Colon cancer Daughter   . Heart disease Maternal Grandmother   . Heart disease Paternal Grandfather      Physical Exam: Filed Vitals:   04/29/13 0130 04/29/13 0230 04/29/13 0330 04/29/13 0358  BP: 163/64 167/64 163/65 170/67  Pulse: 76 81 77 73  Temp: 98.3 F (36.8 C) 98.4 F (36.9 C) 98.5 F (36.9 C) 98.6 F (37 C)  TempSrc: Oral Oral Oral Oral  Resp: 18 20 20 20   Height:      Weight:      SpO2:       Blood pressure 170/67, pulse 73, temperature 98.6 F (37 C), temperature source Oral, resp. rate 20, height 5' (1.524 m), weight 55.2 kg (121 lb 11.1 oz), SpO2 94.00%.  GEN:  Pleasant  patient lying in the stretcher in no acute distress; cooperative with exam. PSYCH:  alert and oriented x4; does not appear anxious or depressed; affect is appropriate. HEENT: Mucous membranes pink and anicteric; PERRLA; EOM intact; no cervical lymphadenopathy nor thyromegaly or carotid bruit; no JVD; There were no stridor. Neck is very supple. Breasts:: Not examined CHEST WALL: No tenderness CHEST: Normal respiration, clear to auscultation bilaterally.  HEART: Regular rate and rhythm.  There are no murmur, rub, or gallops.   BACK: No kyphosis or scoliosis; no CVA tenderness ABDOMEN: soft and non-tender; no masses, no organomegaly, normal abdominal bowel sounds; no pannus; no intertriginous candida. There is no rebound and no distention. Rectal Exam: Not done EXTREMITIES: No bone or joint deformity; age-appropriate arthropathy of the hands and knees; no edema; no  ulcerations.  There is no calf tenderness. Genitalia: not examined PULSES: 2+ and symmetric SKIN: Normal hydration no rash or ulceration CNS: Cranial nerves 2-12 grossly intact no focal lateralizing neurologic deficit.  Speech is fluent; uvula elevated with phonation, facial symmetry and tongue midline. DTR are normal bilaterally, cerebella exam is intact, barbinski is negative and strengths are equaled bilaterally.  No sensory loss.   Labs on Admission:  Basic Metabolic Panel:  Recent Labs Lab 04/28/13 1844  NA 138  K 3.7  CL 111  GLUCOSE 156*  BUN 25*  CREATININE 0.90   Liver Function Tests: No results found for this basename: AST, ALT, ALKPHOS, BILITOT, PROT, ALBUMIN,  in the last 168 hours No results found for this basename: LIPASE, AMYLASE,  in the last 168 hours No results found for this basename: AMMONIA,  in the last 168 hours CBC:  Recent Labs Lab 04/28/13 1809 04/28/13 1844  WBC 4.3  --   NEUTROABS 2.7  --   HGB 6.8* 6.8*  HCT 20.0* 20.0*  MCV 90.9  --   PLT 338  --    Cardiac  Enzymes: No results found for this basename: CKTOTAL, CKMB, CKMBINDEX, TROPONINI,  in the last 168 hours  CBG: No results found for this basename: GLUCAP,  in the last 168 hours   Radiological Exams on Admission: No results found.  Assessment/Plan Present on Admission:  . Anemia . Hypercholesteremia . HTN (hypertension)  PLAN: Will admit her for transfusion.  She will receive 2 units of PRBC.  Further anemia work up will defer to PCP as this has been a chronic problem.  For her hyperthyrodism, will check TSH.  I have continued her meds.  She can be safely discharged after blood.  Thank you for allowing me to participate in her care.  She is a full code.  Other plans as per orders.  Code Status: FULL Unk Lightning, MD. [facility] Pager [phone] 7pm to 7am.  04/29/2013, 4:11 AM

## 2013-04-30 LAB — TYPE AND SCREEN
ABO/RH(D): A POS
Antibody Screen: NEGATIVE
Unit division: 0

## 2013-05-08 ENCOUNTER — Telehealth: Payer: Self-pay | Admitting: Hematology and Oncology

## 2013-05-08 NOTE — Telephone Encounter (Signed)
LM FOR PT DTR Rebekah Cross TO RETURN CALL IN RE TO NP APPT.

## 2013-05-10 ENCOUNTER — Telehealth: Payer: Self-pay | Admitting: Hematology and Oncology

## 2013-05-10 NOTE — Telephone Encounter (Signed)
S/W PT DTR(EMMA) IN RE TO APPT 08/13 @ 3 W/DR. VICTOR. 3 REFERR[address] DR. Rockney Ghee SHAW DX- NORMOCYTIC ANEMIA WELCOME PACKET MAILED.

## 2013-05-11 ENCOUNTER — Telehealth: Payer: Self-pay

## 2013-05-11 NOTE — Telephone Encounter (Signed)
C/D 05/11/13 for appt.05/23/13 °

## 2013-05-23 ENCOUNTER — Other Ambulatory Visit: Payer: Medicare Other | Admitting: Lab

## 2013-05-23 ENCOUNTER — Ambulatory Visit (HOSPITAL_BASED_OUTPATIENT_CLINIC_OR_DEPARTMENT_OTHER): Payer: Medicare Other | Admitting: Hematology and Oncology

## 2013-05-23 ENCOUNTER — Ambulatory Visit: Payer: Medicare Other

## 2013-05-23 VITALS — BP 126/53 | HR 72 | Temp 97.2°F | Resp 18 | Ht 60.0 in | Wt 125.2 lb

## 2013-05-23 DIAGNOSIS — D649 Anemia, unspecified: Secondary | ICD-10-CM

## 2013-05-24 ENCOUNTER — Encounter (HOSPITAL_COMMUNITY): Payer: Self-pay | Admitting: Pharmacy Technician

## 2013-05-25 ENCOUNTER — Other Ambulatory Visit: Payer: Self-pay | Admitting: Radiology

## 2013-05-26 ENCOUNTER — Ambulatory Visit (HOSPITAL_COMMUNITY)
Admission: RE | Admit: 2013-05-26 | Discharge: 2013-05-26 | Disposition: A | Discharge status: Home / Self Care | Payer: Medicare Other | Source: Ambulatory Visit | Attending: Hematology and Oncology | Admitting: Hematology and Oncology

## 2013-05-26 ENCOUNTER — Encounter (HOSPITAL_COMMUNITY): Payer: Self-pay

## 2013-05-26 DIAGNOSIS — Z79899 Other long term (current) drug therapy: Secondary | ICD-10-CM | POA: Insufficient documentation

## 2013-05-26 DIAGNOSIS — I10 Essential (primary) hypertension: Secondary | ICD-10-CM | POA: Insufficient documentation

## 2013-05-26 DIAGNOSIS — D649 Anemia, unspecified: Secondary | ICD-10-CM | POA: Insufficient documentation

## 2013-05-26 DIAGNOSIS — E7800 Pure hypercholesterolemia, unspecified: Secondary | ICD-10-CM | POA: Insufficient documentation

## 2013-05-26 DIAGNOSIS — Z7982 Long term (current) use of aspirin: Secondary | ICD-10-CM | POA: Insufficient documentation

## 2013-05-26 LAB — BONE MARROW EXAM

## 2013-05-26 LAB — CBC
HCT: 26.6 % — ABNORMAL LOW (ref 36.0–46.0)
Hemoglobin: 8.7 g/dL — ABNORMAL LOW (ref 12.0–15.0)
MCH: 29.2 pg (ref 26.0–34.0)
MCV: 89.3 fL (ref 78.0–100.0)
RBC: 2.98 MIL/uL — ABNORMAL LOW (ref 3.87–5.11)

## 2013-05-26 MED ORDER — SODIUM CHLORIDE 0.9 % IV SOLN
INTRAVENOUS | Status: DC
  Administered 2013-05-26: 20 mL/h via INTRAVENOUS

## 2013-05-26 MED ORDER — FENTANYL CITRATE 0.05 MG/ML IJ SOLN
INTRAMUSCULAR | Status: AC
  Filled 2013-05-26: qty 6

## 2013-05-26 MED ORDER — MIDAZOLAM HCL 2 MG/2ML IJ SOLN
INTRAMUSCULAR | Status: AC | PRN
  Administered 2013-05-26: 1 mg via INTRAVENOUS

## 2013-05-26 MED ORDER — FENTANYL CITRATE 0.05 MG/ML IJ SOLN
INTRAMUSCULAR | Status: AC | PRN
  Administered 2013-05-26: 50 ug via INTRAVENOUS

## 2013-05-26 MED ORDER — MIDAZOLAM HCL 2 MG/2ML IJ SOLN
INTRAMUSCULAR | Status: AC
  Filled 2013-05-26: qty 6

## 2013-05-26 NOTE — H&P (Signed)
Chief Complaint: "I am here for a bone marrow biopsy." Referring Physician: Dr. Roanna Raider HPI: Rebekah Cross is an 79 y.o. female with PMHx chronic anemia since year 2000 per labs. Patient states she has had platelet transfusion in 11/13 and blood transfusion 07/14. Patient has had colonoscopy in the past with no source of bleeding. Patient denies any active blood in her stool or urine. Patient denies any chest pain or shortness of breath. She states during her acute anemic episodes she experiences fatigue and heart palpitations. She denies any recent fever or chills.  Past Medical History:  Past Medical History  Diagnosis Date  . Anemia, unspecified   . Hypertension   . Thyroid disease   . Hypercholesteremia     Past Surgical History:  Past Surgical History  Procedure Laterality Date  . Abdominal hysterectomy    . Back surgery    . Arthroscopy right knee    . Arthroscopy left knee      Family History:  Family History  Problem Relation Age of Onset  . Colon cancer Daughter   . Heart disease Maternal Grandmother   . Heart disease Paternal Grandfather     Social History:  reports that she has never smoked. She has never used smokeless tobacco. She reports that she does not drink alcohol or use illicit drugs.  Allergies:  Allergies  Allergen Reactions  . Oxycodone Nausea And Vomiting and Other (See Comments)    "jerking"  . Codeine Nausea And Vomiting and Rash    unknown  . Methimazole [Thiamazole] Rash  . Novocain [Procaine] Rash    Medications:   Medication List    ASK your doctor about these medications       acitretin 25 MG capsule  Commonly known as:  SORIATANE  Take 25 mg by mouth every Monday, Wednesday, and Friday.     aspirin 81 MG tablet  Take 81 mg by mouth daily.     cholecalciferol 400 UNITS Tabs tablet  Commonly known as:  VITAMIN D  Take 400 Units by mouth daily.     LORazepam 0.5 MG tablet  Commonly known as:  ATIVAN  Take 0.5-1 mg by mouth  at bedtime.     metoprolol succinate 100 MG 24 hr tablet  Commonly known as:  TOPROL-XL  Take 100 mg by mouth every morning.     mometasone 0.1 % cream  Commonly known as:  ELOCON  Apply 1 application topically daily as needed. For psoriasis     multivitamin tablet  Take 1 tablet by mouth daily.     nisoldipine 25.5 MG 24 hr tablet  Commonly known as:  SULAR  Take 25.5 mg by mouth daily.     olmesartan-hydrochlorothiazide 40-25 MG per tablet  Commonly known as:  BENICAR HCT  Take 1 tablet by mouth every morning.     potassium chloride SA 20 MEQ tablet  Commonly known as:  K-DUR,KLOR-CON  Take 20 mEq by mouth daily.     pravastatin 40 MG tablet  Commonly known as:  PRAVACHOL  Take 40 mg by mouth at bedtime.        Please HPI for pertinent positives, otherwise complete 10 system ROS negative.  Physical Exam: BP 155/47  Pulse 70  Temp(Src) 97.4 F (36.3 C) (Oral)  Resp 20  Ht 5' (1.524 m)  Wt 125 lb (56.7 kg)  BMI 24.41 kg/m2  SpO2 97% Body mass index is 24.41 kg/(m^2).   General Appearance:  Alert, cooperative, no distress,  appears stated age  Head:  Normocephalic, without obvious abnormality, atraumatic  Lungs:   Clear to auscultation bilaterally, no w/r/r, respirations unlabored without use of accessory muscles.  Chest Wall:  No tenderness or deformity  Heart:  Regular rate and rhythm, S1, S2 normal, no murmur, rub or gallop.  Abdomen:   Soft, non-tender, non distended.  Extremities: Extremities normal, atraumatic, no cyanosis or edema  Pulses: 2+ and symmetric  Neurologic: Normal affect, no gross deficits.   Results for orders placed during the hospital encounter of 05/26/13 (from the past 48 hour(s))  APTT     Status: None   Collection Time    05/26/13  9:15 AM      Result Value Range   aPTT 26  24 - 37 seconds  CBC     Status: Abnormal   Collection Time    05/26/13  9:15 AM      Result Value Range   WBC 4.4  4.0 - 10.5 K/uL   RBC 2.98 (*) 3.87 -  5.11 MIL/uL   Hemoglobin 8.7 (*) 12.0 - 15.0 g/dL   HCT 16.1 (*) 09.6 - 04.5 %   MCV 89.3  78.0 - 100.0 fL   MCH 29.2  26.0 - 34.0 pg   MCHC 32.7  30.0 - 36.0 g/dL   RDW 40.9 (*) 81.1 - 91.4 %   Platelets 401 (*) 150 - 400 K/uL  PROTIME-INR     Status: None   Collection Time    05/26/13  9:15 AM      Result Value Range   Prothrombin Time 14.2  11.6 - 15.2 seconds   INR 1.12  0.00 - 1.49   No results found.  Assessment/Plan Chronic anemia requiring transfusion. Request for CT guided bone marrow biopsy. Labs reviewed. Patient has been NPO. Risks and Benefits discussed with the patient. All of the patient's questions were answered, patient is agreeable to proceed. Consent signed and in chart.   Pattricia Boss D PA-C 05/26/2013, 11:01 AM

## 2013-05-26 NOTE — Procedures (Signed)
CT guided bone marrow aspirates and core biopsy.  No immediate complication.   

## 2013-05-28 ENCOUNTER — Telehealth: Payer: Self-pay | Admitting: Hematology and Oncology

## 2013-05-28 NOTE — Telephone Encounter (Signed)
lmonvm for pt re appt for 9/3. Schedule mailed.

## 2013-06-07 ENCOUNTER — Telehealth: Payer: Self-pay | Admitting: Hematology and Oncology

## 2013-06-07 ENCOUNTER — Other Ambulatory Visit (HOSPITAL_BASED_OUTPATIENT_CLINIC_OR_DEPARTMENT_OTHER): Payer: Medicare Other | Admitting: Lab

## 2013-06-07 ENCOUNTER — Ambulatory Visit (HOSPITAL_BASED_OUTPATIENT_CLINIC_OR_DEPARTMENT_OTHER): Payer: Medicare Other | Admitting: Hematology and Oncology

## 2013-06-07 VITALS — BP 173/50 | HR 82 | Temp 97.5°F | Resp 18 | Ht 60.0 in | Wt 125.0 lb

## 2013-06-07 DIAGNOSIS — D649 Anemia, unspecified: Secondary | ICD-10-CM

## 2013-06-07 DIAGNOSIS — D539 Nutritional anemia, unspecified: Secondary | ICD-10-CM

## 2013-06-07 LAB — CBC WITH DIFFERENTIAL/PLATELET
Basophils Absolute: 0.1 10*3/uL (ref 0.0–0.1)
EOS%: 19.1 % — ABNORMAL HIGH (ref 0.0–7.0)
Eosinophils Absolute: 0.8 10*3/uL — ABNORMAL HIGH (ref 0.0–0.5)
HGB: 7.2 g/dL — ABNORMAL LOW (ref 11.6–15.9)
LYMPH%: 22.3 % (ref 14.0–49.7)
MCH: 29.5 pg (ref 25.1–34.0)
MCV: 88 fL (ref 79.5–101.0)
MONO%: 4.3 % (ref 0.0–14.0)
Platelets: 396 10*3/uL (ref 145–400)
RDW: 20.8 % — ABNORMAL HIGH (ref 11.2–14.5)

## 2013-06-07 LAB — MORPHOLOGY

## 2013-06-07 LAB — IRON AND TIBC CHCC
Iron: 157 ug/dL — ABNORMAL HIGH (ref 41–142)
TIBC: 244 ug/dL (ref 236–444)
UIBC: 87 ug/dL — ABNORMAL LOW (ref 120–384)

## 2013-06-07 LAB — FERRITIN CHCC: Ferritin: 378 ng/ml — ABNORMAL HIGH (ref 9–269)

## 2013-06-07 NOTE — Telephone Encounter (Signed)
lvm for pt regarding to every friday this [facility] lab...pt needed to go back to the lab for more labs pt did not pick up the phone

## 2013-06-08 LAB — VITAMIN B12: Vitamin B-12: 620 pg/mL (ref 211–911)

## 2013-06-09 ENCOUNTER — Other Ambulatory Visit: Payer: Medicare Other | Admitting: Lab

## 2013-06-09 LAB — SPEP & IFE WITH QIG
Albumin ELP: 59.9 % (ref 55.8–66.1)
Alpha-2-Globulin: 9.6 % (ref 7.1–11.8)
Beta Globulin: 5.8 % (ref 4.7–7.2)
IgG (Immunoglobin G), Serum: 1050 mg/dL (ref 690–1700)
Total Protein, Serum Electrophoresis: 6.6 g/dL (ref 6.0–8.3)

## 2013-06-09 LAB — DIRECT ANTIGLOBULIN TEST (NOT AT ARMC): DAT (Complement): NEGATIVE

## 2013-06-13 LAB — UIFE/LIGHT CHAINS/TP QN, 24-HR UR
Albumin, U: DETECTED
Free Kappa/Lambda Ratio: 8.33 ratio (ref 2.04–10.37)
Free Lt Chn Excr Rate: 1.88 mg/d
Total Protein, Urine-Ur/day: 10 mg/d (ref 10–140)
Total Protein, Urine: 1.3 mg/dL

## 2013-06-14 ENCOUNTER — Telehealth: Payer: Self-pay | Admitting: Hematology and Oncology

## 2013-06-14 NOTE — Telephone Encounter (Signed)
pt called to r/s OCT lab due to going out of town...done

## 2013-06-16 ENCOUNTER — Ambulatory Visit (HOSPITAL_BASED_OUTPATIENT_CLINIC_OR_DEPARTMENT_OTHER): Payer: Medicare Other

## 2013-06-16 ENCOUNTER — Ambulatory Visit (HOSPITAL_COMMUNITY)
Admission: RE | Admit: 2013-06-16 | Discharge: 2013-06-16 | Disposition: A | Discharge status: Home / Self Care | Payer: Medicare Other | Source: Ambulatory Visit | Attending: Hematology and Oncology | Admitting: Hematology and Oncology

## 2013-06-16 ENCOUNTER — Other Ambulatory Visit: Payer: Self-pay | Admitting: *Deleted

## 2013-06-16 ENCOUNTER — Other Ambulatory Visit (HOSPITAL_BASED_OUTPATIENT_CLINIC_OR_DEPARTMENT_OTHER): Payer: Medicare Other

## 2013-06-16 VITALS — BP 136/56 | HR 70 | Temp 97.2°F | Resp 18

## 2013-06-16 DIAGNOSIS — D649 Anemia, unspecified: Secondary | ICD-10-CM | POA: Insufficient documentation

## 2013-06-16 LAB — CBC WITH DIFFERENTIAL/PLATELET
Eosinophils Absolute: 1.4 10*3/uL — ABNORMAL HIGH (ref 0.0–0.5)
HCT: 19.5 % — ABNORMAL LOW (ref 34.8–46.6)
LYMPH%: 14 % (ref 14.0–49.7)
MCHC: 31.8 g/dL (ref 31.5–36.0)
MCV: 90.3 fL (ref 79.5–101.0)
MONO#: 0.3 10*3/uL (ref 0.1–0.9)
MONO%: 4.6 % (ref 0.0–14.0)
NEUT#: 3 10*3/uL (ref 1.5–6.5)
NEUT%: 53.7 % (ref 38.4–76.8)
Platelets: 414 10*3/uL — ABNORMAL HIGH (ref 145–400)
WBC: 5.5 10*3/uL (ref 3.9–10.3)

## 2013-06-16 MED ORDER — DIPHENHYDRAMINE HCL 25 MG PO CAPS
ORAL_CAPSULE | ORAL | Status: AC
  Filled 2013-06-16: qty 1

## 2013-06-16 MED ORDER — SODIUM CHLORIDE 0.9 % IV SOLN
250.0000 mL | Freq: Once | INTRAVENOUS | Status: AC
  Administered 2013-06-16: 250 mL via INTRAVENOUS

## 2013-06-16 MED ORDER — ACETAMINOPHEN 325 MG PO TABS
ORAL_TABLET | ORAL | Status: AC
  Filled 2013-06-16: qty 2

## 2013-06-16 MED ORDER — SODIUM CHLORIDE 0.9 % IJ SOLN
3.0000 mL | INTRAMUSCULAR | Status: DC | PRN
  Filled 2013-06-16: qty 10

## 2013-06-16 MED ORDER — DIPHENHYDRAMINE HCL 25 MG PO CAPS
25.0000 mg | ORAL_CAPSULE | Freq: Once | ORAL | Status: AC
  Administered 2013-06-16: 25 mg via ORAL

## 2013-06-16 MED ORDER — ACETAMINOPHEN 325 MG PO TABS
650.0000 mg | ORAL_TABLET | Freq: Once | ORAL | Status: AC
  Administered 2013-06-16: 650 mg via ORAL

## 2013-06-16 NOTE — Progress Notes (Signed)
ID: Bronwen Betters OB: [birth year]  MR#: [id number]  XBM#:[id number]  [facility]  [phone] [phone]   INITIAL HEMATOLOGY CONSULTATION    Referral MD:   Lupita Raider, MD  Reason for Referral: Anemia   HISTORY OF PRESENT ILLNESS: The patient is a 79 y.o. femaie who was sent to clinic for evaluation of anemia.which require transfusion, patient presented today to clinic first  Time. Her appetite is good and weight is stable. She complains on feeling cold, headaches, blurry vision, clear nasal discharge, palpitations, vomiting without warning, diarrhea, occasionally feet numbness, itchining. The patient denied fever, chills, night sweats, change in appetite or weight. She denied double vision,nasal congestion, hearing problems, odynophagia or dysphagia. No chest pain, dyspnea, cough, abdominal pain, nausea, v constipation, hematochezia. The patient denied dysuria, nocturia, polyuria, hematuria, myalgia,  tingling, psychiatric problems.  Review of Systems  Constitutional: Negative for fever, chills, weight loss, malaise/fatigue and diaphoresis.  HENT: Negative for hearing loss, ear pain, nosebleeds, congestion, sore throat, neck pain and tinnitus.   Eyes: Positive for blurred vision. Negative for double vision, photophobia, pain, discharge and redness.  Respiratory: Negative for cough, hemoptysis, sputum production, shortness of breath and wheezing.   Cardiovascular: Positive for palpitations. Negative for chest pain, orthopnea, claudication, leg swelling and PND.  Gastrointestinal: Positive for vomiting and diarrhea. Negative for heartburn, nausea, abdominal pain, constipation, blood in stool and melena.  Genitourinary: Negative for dysuria, urgency, frequency, hematuria and flank pain.  Musculoskeletal: Negative for myalgias, back pain, joint pain and falls.  Skin: Positive for itching. Negative for rash.  Neurological: Positive for sensory change and  headaches. Negative for dizziness, tingling, tremors, speech change, focal weakness, seizures, loss of consciousness and weakness.  Endo/Heme/Allergies: Does not bruise/bleed easily.  Psychiatric/Behavioral: Negative.     PAST MEDICAL HISTORY: Past Medical History  Diagnosis Date  . Anemia, unspecified   . Hypertension   . Thyroid disease   . Hypercholesteremia     PAST SURGICAL HISTORY: Past Surgical History  Procedure Laterality Date  . Abdominal hysterectomy    . Back surgery    . Arthroscopy right knee    . Arthroscopy left knee      FAMILY HISTORY Family History  Problem Relation Age of Onset  . Colon cancer Daughter   . Heart disease Maternal Grandmother   . Heart disease Paternal Grandfather     HEALTH MAINTENANCE: History  Substance Use Topics  . Smoking status: Never Smoker   . Smokeless tobacco: Never Used  . Alcohol Use: No   Allergies  Allergen Reactions  . Novocain [Procaine] Swelling and Rash  . Oxycodone Nausea And Vomiting and Other (See Comments)    "jerking"  . Codeine Nausea And Vomiting and Rash    unknown  . Methimazole [Thiamazole] Rash    Current Outpatient Prescriptions  Medication Sig Dispense Refill  . acitretin (SORIATANE) 25 MG capsule Take 25 mg by mouth every Monday, Wednesday, and Friday.       Marland Kitchen aspirin 81 MG tablet Take 81 mg by mouth daily.        . cholecalciferol (VITAMIN D) 400 UNITS TABS Take 400 Units by mouth daily.      Marland Kitchen LORazepam (ATIVAN) 0.5 MG tablet Take 0.5-1 mg by mouth at bedtime.       . metoprolol succinate (TOPROL-XL) 100 MG 24 hr tablet Take 100 mg by mouth every morning.       . mometasone (ELOCON) 0.1 % cream Apply 1 application topically daily  as needed. For psoriasis      . Multiple Vitamin (MULTIVITAMIN) tablet Take 1 tablet by mouth daily.        . nisoldipine (SULAR) 25.5 MG 24 hr tablet Take 25.5 mg by mouth daily.        Marland Kitchen olmesartan-hydrochlorothiazide (BENICAR HCT) 40-25 MG per tablet Take 1  tablet by mouth every morning.       . potassium chloride SA (K-DUR,KLOR-CON) 20 MEQ tablet Take 20 mEq by mouth daily.      . pravastatin (PRAVACHOL) 40 MG tablet Take 40 mg by mouth at bedtime.        No current facility-administered medications for this visit.    OBJECTIVE: Filed Vitals:   05/23/13 1548  BP: 126/53  Pulse: 72  Temp: 97.2 F (36.2 C)  Resp: 18     Body mass index is 24.45 kg/(m^2).      PHYSICAL EXAMINATION:  HEENT: Sclerae anicteric.  Conjunctivae were pink. Pupils round and reactive bilaterally. Oral mucosa is moist without ulceration or thrush. No occipital, submandibular, cervical, supraclavicular or axillar adenopathy.Diminished hearing on right. Lungs: clear to auscultation without wheezes. No rales or rhonchi. Heart: regular rate and rhythm. No murmur, gallop or rubs. Abdomen: soft, non tender. No guarding or rebound tenderness. Bowel sounds are present. No palpable hepatosplenomegaly. MSK: no focal spinal tenderness. Extremities: No clubbing or cyanosis.No calf tenderness to palpitation, no peripheral edema. The patient had grossly intact strength in upper and lower extremities. Skin exam was without ecchymosis, petechiae. Neuro: non-focal, alert and oriented to time, person and place, appropriate affect  LAB RESULTS:  CMP     Component Value Date/Time   NA 141 04/29/2013 0532   K 4.4 04/29/2013 0532   CL 106 04/29/2013 0532   CO2 27 04/29/2013 0532   GLUCOSE 126* 04/29/2013 0532   BUN 22 04/29/2013 0532   CREATININE 0.68 04/29/2013 0532   CALCIUM 9.7 04/29/2013 0532   PROT 7.1 08/25/2012 1747   ALBUMIN 4.3 08/25/2012 1747   AST 17 08/25/2012 1747   ALT 17 08/25/2012 1747   ALKPHOS 66 08/25/2012 1747   BILITOT 0.8 08/25/2012 1747   GFRNONAA 77* 04/29/2013 0532   GFRAA 89* 04/29/2013 0532    I No results found for this basename: SPEP, UPEP,  kappa and lambda light chains    Lab Results  Component Value Date   WBC 4.3 06/07/2013   NEUTROABS 2.2  06/07/2013   HGB 7.2* 06/07/2013   HCT 21.4* 06/07/2013   MCV 88.0 06/07/2013   PLT 396 06/07/2013      Chemistry      Component Value Date/Time   NA 141 04/29/2013 0532   K 4.4 04/29/2013 0532   CL 106 04/29/2013 0532   CO2 27 04/29/2013 0532   BUN 22 04/29/2013 0532   CREATININE 0.68 04/29/2013 0532      Component Value Date/Time   CALCIUM 9.7 04/29/2013 0532   ALKPHOS 66 08/25/2012 1747   AST 17 08/25/2012 1747   ALT 17 08/25/2012 1747   BILITOT 0.8 08/25/2012 1747         ASSESSMENT AND PLAN: .Normocytic  Anemia. Work up for multiple myeloma will be initiated.  Bone marrow biopsy and aspiration will be arrange Follow up after bone marrow biopsy   Myra Rude, MD   05/23/2013 8:48 PM

## 2013-06-16 NOTE — Patient Instructions (Signed)
Blood Transfusion Information WHAT IS A BLOOD TRANSFUSION? A transfusion is the replacement of blood or some of its parts. Blood is made up of multiple cells which provide different functions.  Red blood cells carry oxygen and are used for blood loss replacement.  White blood cells fight against infection.  Platelets control bleeding.  Plasma helps clot blood.  Other blood products are available for specialized needs, such as hemophilia or other clotting disorders. BEFORE THE TRANSFUSION  Who gives blood for transfusions?   You may be able to donate blood to be used at a later date on yourself (autologous donation).  Relatives can be asked to donate blood. This is generally not any safer than if you have received blood from a stranger. The same precautions are taken to ensure safety when a relative's blood is donated.  Healthy volunteers who are fully evaluated to make sure their blood is safe. This is blood bank blood. Transfusion therapy is the safest it has ever been in the practice of medicine. Before blood is taken from a donor, a complete history is taken to make sure that person has no history of diseases nor engages in risky social behavior (examples are intravenous drug use or sexual activity with multiple partners). The donor's travel history is screened to minimize risk of transmitting infections, such as malaria. The donated blood is tested for signs of infectious diseases, such as HIV and hepatitis. The blood is then tested to be sure it is compatible with you in order to minimize the chance of a transfusion reaction. If you or a relative donates blood, this is often done in anticipation of surgery and is not appropriate for emergency situations. It takes many days to process the donated blood. RISKS AND COMPLICATIONS Although transfusion therapy is very safe and saves many lives, the main dangers of transfusion include:   Getting an infectious disease.  Developing a  transfusion reaction. This is an allergic reaction to something in the blood you were given. Every precaution is taken to prevent this. The decision to have a blood transfusion has been considered carefully by your caregiver before blood is given. Blood is not given unless the benefits outweigh the risks. AFTER THE TRANSFUSION  Right after receiving a blood transfusion, you will usually feel much better and more energetic. This is especially true if your red blood cells have gotten low (anemic). The transfusion raises the level of the red blood cells which carry oxygen, and this usually causes an energy increase.  The nurse administering the transfusion will monitor you carefully for complications. HOME CARE INSTRUCTIONS  No special instructions are needed after a transfusion. You may find your energy is better. Speak with your caregiver about any limitations on activity for underlying diseases you may have. SEEK MEDICAL CARE IF:   Your condition is not improving after your transfusion.  You develop redness or irritation at the intravenous (IV) site. SEEK IMMEDIATE MEDICAL CARE IF:  Any of the following symptoms occur over the next 12 hours:  Shaking chills.  You have a temperature by mouth above 102 F (38.9 C), not controlled by medicine.  Chest, back, or muscle pain.  People around you feel you are not acting correctly or are confused.  Shortness of breath or difficulty breathing.  Dizziness and fainting.  You get a rash or develop hives.  You have a decrease in urine output.  Your urine turns a dark color or changes to pink, red, or brown. Any of the following   symptoms occur over the next 10 days:  You have a temperature by mouth above 102 F (38.9 C), not controlled by medicine.  Shortness of breath.  Weakness after normal activity.  The white part of the eye turns yellow (jaundice).  You have a decrease in the amount of urine or are urinating less often.  Your  urine turns a dark color or changes to pink, red, or brown. Document Released: 09/18/2000 Document Revised: 12/14/2011 Document Reviewed: 05/07/2008 [employer] Patient Information 2014 [employer].  

## 2013-06-17 LAB — TYPE AND SCREEN
ABO/RH(D): A POS
Antibody Screen: NEGATIVE
Unit division: 0
Unit division: 0

## 2013-06-20 ENCOUNTER — Telehealth: Payer: Self-pay | Admitting: *Deleted

## 2013-06-20 NOTE — Progress Notes (Signed)
ID: Rebekah Cross OB: 1925/01/15  MR#: [id number]  NFA#:[id number]  [facility]  [phone] [phone]   OFFICE PROGRESS NOTE  PCP: Lupita Raider, MD  DIAGNOSIS:  Anemia    INTERVAL HISTORY: The patient is a 79 y.o. femaie who was sent to clinic for evaluation of anemia.which require transfusion, patient presented today for follow up here work up results  Her appetite is good and weight is stable. She complains on feeling cold, headaches, blurry vision, clear nasal discharge, palpitations, vomiting without warning, diarrhea, occasionally feet numbness, itchining. The patient denied fever, chills, night sweats, change in appetite or weight. She denied double vision,nasal congestion, hearing problems, odynophagia or dysphagia. No chest pain, dyspnea, cough, abdominal pain, nausea, v constipation, hematochezia. The patient denied dysuria, nocturia, polyuria, hematuria, myalgia, tingling, psychiatric problems.   Review of Systems  Constitutional: Negative for fever, chills, weight loss, malaise/fatigue and diaphoresis.  HENT: Negative for hearing loss, nosebleeds, congestion, sore throat, neck pain and tinnitus.   Eyes: Positive for blurred vision. Negative for double vision and photophobia.  Respiratory: Positive for shortness of breath. Negative for cough, hemoptysis, sputum production, wheezing and stridor.   Cardiovascular: Positive for palpitations. Negative for chest pain, orthopnea, claudication, leg swelling and PND.  Gastrointestinal: Negative for heartburn, nausea, vomiting, abdominal pain, diarrhea, constipation, blood in stool and melena.  Genitourinary: Negative for dysuria, urgency, frequency, hematuria and flank pain.  Musculoskeletal: Negative for myalgias, back pain, joint pain and falls.  Skin: Positive for itching. Negative for rash.  Neurological: Positive for dizziness, sensory change, weakness and headaches. Negative for tingling, tremors,  speech change, focal weakness, seizures and loss of consciousness.  Endo/Heme/Allergies: Does not bruise/bleed easily.  Psychiatric/Behavioral: Negative.    PAST MEDICAL HISTORY: Past Medical History  Diagnosis Date  . Anemia, unspecified   . Hypertension   . Thyroid disease   . Hypercholesteremia     PAST SURGICAL HISTORY: Past Surgical History  Procedure Laterality Date  . Abdominal hysterectomy    . Back surgery    . Arthroscopy right knee    . Arthroscopy left knee      FAMILY HISTORY Family History  Problem Relation Age of Onset  . Colon cancer Daughter   . Heart disease Maternal Grandmother   . Heart disease Paternal Grandfather   HEALTH MAINTENANCE: History  Substance Use Topics  . Smoking status: Never Smoker   . Smokeless tobacco: Never Used  . Alcohol Use: No    Allergies  Allergen Reactions  . Novocain [Procaine] Swelling and Rash  . Oxycodone Nausea And Vomiting and Other (See Comments)    "jerking"  . Codeine Nausea And Vomiting and Rash    unknown  . Methimazole [Thiamazole] Rash    Current Outpatient Prescriptions  Medication Sig Dispense Refill  . acitretin (SORIATANE) 25 MG capsule Take 25 mg by mouth every Monday, Wednesday, and Friday.       Marland Kitchen aspirin 81 MG tablet Take 81 mg by mouth daily.        . cholecalciferol (VITAMIN D) 400 UNITS TABS Take 400 Units by mouth daily.      Marland Kitchen LORazepam (ATIVAN) 0.5 MG tablet Take 0.5-1 mg by mouth at bedtime.       . metoprolol succinate (TOPROL-XL) 100 MG 24 hr tablet Take 100 mg by mouth every morning.       . mometasone (ELOCON) 0.1 % cream Apply 1 application topically daily as needed. For psoriasis      . Multiple  Vitamin (MULTIVITAMIN) tablet Take 1 tablet by mouth daily.        . nisoldipine (SULAR) 25.5 MG 24 hr tablet Take 25.5 mg by mouth daily.        Marland Kitchen olmesartan-hydrochlorothiazide (BENICAR HCT) 40-25 MG per tablet Take 1 tablet by mouth every morning.       . potassium chloride SA  (K-DUR,KLOR-CON) 20 MEQ tablet Take 20 mEq by mouth daily.      . pravastatin (PRAVACHOL) 40 MG tablet Take 40 mg by mouth at bedtime.        No current facility-administered medications for this visit.    OBJECTIVE: Filed Vitals:   06/07/13 0914  BP: 173/50  Pulse: 82  Temp: 97.5 F (36.4 C)  Resp: 18     Body mass index is 24.41 kg/(m^2).      PHYSICAL EXAMINATION:  HEENT: Sclerae anicteric.  Conjunctivae were pink. Pupils round and reactive bilaterally. Oral mucosa is moist without ulceration or thrush. No occipital, submandibular, cervical, supraclavicular or axillar adenopathy. Diminished hearing on right side. Lungs: clear to auscultation without wheezes. No rales or rhonchi. Heart: regular rate and rhythm. No murmur, gallop or rubs. Abdomen: soft, non tender. No guarding or rebound tenderness. Bowel sounds are present. No palpable hepatosplenomegaly. MSK: no focal spinal tenderness. Extremities: No clubbing or cyanosis.No calf tenderness to palpitation, no peripheral edema. The patient had grossly intact strength in upper and lower extremities. Skin exam was without ecchymosis, petechiae. Neuro: non-focal, alert and oriented to time, person and place, appropriate affect  LAB RESULTS:  CMP     Component Value Date/Time   NA 141 04/29/2013 0532   K 4.4 04/29/2013 0532   CL 106 04/29/2013 0532   CO2 27 04/29/2013 0532   GLUCOSE 126* 04/29/2013 0532   BUN 22 04/29/2013 0532   CREATININE 0.68 04/29/2013 0532   CALCIUM 9.7 04/29/2013 0532   PROT 7.1 08/25/2012 1747   ALBUMIN 4.3 08/25/2012 1747   AST 17 08/25/2012 1747   ALT 17 08/25/2012 1747   ALKPHOS 66 08/25/2012 1747   BILITOT 0.8 08/25/2012 1747   GFRNONAA 77* 04/29/2013 0532   GFRAA 89* 04/29/2013 0532    I No results found for this basename: SPEP, UPEP,  kappa and lambda light chains    Lab Results  Component Value Date   WBC 5.5 06/16/2013   NEUTROABS 3.0 06/16/2013   HGB 6.2* 06/16/2013   HCT 19.5* 06/16/2013    MCV 90.3 06/16/2013   PLT 414* 06/16/2013      Chemistry      Component Value Date/Time   NA 141 04/29/2013 0532   K 4.4 04/29/2013 0532   CL 106 04/29/2013 0532   CO2 27 04/29/2013 0532   BUN 22 04/29/2013 0532   CREATININE 0.68 04/29/2013 0532      Component Value Date/Time   CALCIUM 9.7 04/29/2013 0532   ALKPHOS 66 08/25/2012 1747   AST 17 08/25/2012 1747   ALT 17 08/25/2012 1747   BILITOT 0.8 08/25/2012 1747       No results found for this basename: LABCA2    No components found with this basename: LABCA125    No results found for this basename: INR,  in the last 168 hours  Urinalysis    Component Value Date/Time   COLORURINE YELLOW 08/25/2012 2015   APPEARANCEUR CLEAR 08/25/2012 2015   LABSPEC 1.017 08/25/2012 2015   PHURINE 7.0 08/25/2012 2015   GLUCOSEU NEGATIVE 08/25/2012 2015   HGBUR NEGATIVE 08/25/2012 2015  BILIRUBINUR NEGATIVE 08/25/2012 2015   KETONESUR NEGATIVE 08/25/2012 2015   PROTEINUR 100* 08/25/2012 2015   UROBILINOGEN 0.2 08/25/2012 2015   NITRITE NEGATIVE 08/25/2012 2015   LEUKOCYTESUR NEGATIVE 08/25/2012 2015    STUDIES: Ct Biopsy  05/26/2013   *RADIOLOGY REPORT*  Clinical history: Anemia.   PROCEDURE(S): CT GUIDED BONE MARROW ASPIRATES AND BIOPSY  Physician: Rachelle Hora. Henn, MD   Medications: Versed 1.5 mg, Fentanyl 150 mcg. A radiology nurse monitored the patient for moderate sedation.   Procedure: The procedure was explained to the patient. The risks and benefits of the procedure were discussed and the patient's questions were addressed.  Informed consent was obtained from the patient. The patient was placed prone on CT scan. Images of the pelvis were obtained. The right side of back was prepped and draped in sterile fashion. The skin and right posterior iliac bone were anesthetized with 1% lidocaine.   11 gauge bone needle was directed into the right iliac bone with CT guidance. Two aspirates and one core biopsy obtained.  Core biopsy was obtained  with a bone drill.   Findings: Needle position within the posterior right iliac bone.  Complications: None   Impression: CT guided bone marrow aspirates and core biopsy.   Original Report Authenticated By: Richarda Overlie, M.D.    ASSESSMENT AND PLAN: Anemia. Results of bone marrow biopsy: Bone Marrow, Aspirate,Biopsy, and Clot, right iliac - HYPERCELLULAR BONE MARROW FOR AGE WITH DYSPOIETIC CHANGES. - SEE COMMENT. PERIPHERAL BLOOD: - NORMOCYTIC-NORMOCHROMIC ANEMIA. Diagnosis Note The bone marrow is hypercellular for age with dyspoietic changes primarily involving the granulocytic and/or megakaryocytic cell lines. Definite increase in blastic cells is not identified by morphology, immunoperoxidase stains or flow cytometric studies. The overall findings likely represent a myelodysplastic state or less likely a myelodysplastic/myeloproliferative process. Probably MDS. With low intermidian score. Will check Epo CBC weekly and transfuse as needed. Follow up in 3-4 weeks  Myra Rude, MD   06/08/2013 6:49 AM

## 2013-06-20 NOTE — Telephone Encounter (Signed)
Lm gv appt for 07/05/13 with labs @ 2pm and ov @ 2:30pm. i made the pt aware not to come for labs on 07/06/13. im also mailing a letter/avs...td

## 2013-06-23 ENCOUNTER — Telehealth: Payer: Self-pay | Admitting: *Deleted

## 2013-06-23 ENCOUNTER — Other Ambulatory Visit (HOSPITAL_BASED_OUTPATIENT_CLINIC_OR_DEPARTMENT_OTHER): Payer: Medicare Other

## 2013-06-23 DIAGNOSIS — D649 Anemia, unspecified: Secondary | ICD-10-CM

## 2013-06-23 LAB — CBC WITH DIFFERENTIAL/PLATELET
Basophils Absolute: 0.2 10*3/uL — ABNORMAL HIGH (ref 0.0–0.1)
EOS%: 19.5 % — ABNORMAL HIGH (ref 0.0–7.0)
HGB: 8.7 g/dL — ABNORMAL LOW (ref 11.6–15.9)
MCH: 29.4 pg (ref 25.1–34.0)
MCV: 87.1 fL (ref 79.5–101.0)
MONO%: 3.8 % (ref 0.0–14.0)
NEUT%: 53.7 % (ref 38.4–76.8)
RDW: 18.8 % — ABNORMAL HIGH (ref 11.2–14.5)

## 2013-06-23 NOTE — Telephone Encounter (Signed)
Spoke w/ pt and dau in lobby.  Pt states feeling much better since blood transfusion last week.  Informed Hgb 8.7 today and no need for transfusion.  Instructed to return for lab again in one week as scheduled. They verbalized understanding.

## 2013-06-28 ENCOUNTER — Other Ambulatory Visit: Payer: Self-pay | Admitting: Hematology and Oncology

## 2013-06-28 DIAGNOSIS — D649 Anemia, unspecified: Secondary | ICD-10-CM

## 2013-06-29 ENCOUNTER — Other Ambulatory Visit (HOSPITAL_BASED_OUTPATIENT_CLINIC_OR_DEPARTMENT_OTHER): Payer: Medicare Other | Admitting: Lab

## 2013-06-29 ENCOUNTER — Other Ambulatory Visit: Payer: Self-pay | Admitting: *Deleted

## 2013-06-29 ENCOUNTER — Telehealth: Payer: Self-pay | Admitting: *Deleted

## 2013-06-29 DIAGNOSIS — D649 Anemia, unspecified: Secondary | ICD-10-CM

## 2013-06-29 DIAGNOSIS — D539 Nutritional anemia, unspecified: Secondary | ICD-10-CM

## 2013-06-29 LAB — CBC WITH DIFFERENTIAL/PLATELET
BASO%: 3.6 % — ABNORMAL HIGH (ref 0.0–2.0)
EOS%: 15.2 % — ABNORMAL HIGH (ref 0.0–7.0)
HCT: 24.4 % — ABNORMAL LOW (ref 34.8–46.6)
LYMPH%: 22 % (ref 14.0–49.7)
MCH: 29.1 pg (ref 25.1–34.0)
MCHC: 33.5 g/dL (ref 31.5–36.0)
MONO%: 3.3 % (ref 0.0–14.0)
NEUT%: 55.9 % (ref 38.4–76.8)
Platelets: 435 10*3/uL — ABNORMAL HIGH (ref 145–400)

## 2013-06-29 LAB — HOLD TUBE, BLOOD BANK

## 2013-06-29 NOTE — Telephone Encounter (Signed)
Per staff phone call and POF I have scheduled appt. Desk RN to call patient. JMW

## 2013-06-29 NOTE — Telephone Encounter (Signed)
Patient in office today for CBC, Hgb=8.2, patient asymptomatic except for mild fatigue. States she feels fine and does not feel she needs blood this week. However, next week patient will be going out of town to [place] on 07/06/13 and will be doing a moderate amount of walking. We will adjust appts for 10/1 for 0800am lab and transfusion, and leave appt with Dr Bertis Ruddy as scheduled. This plan is agreeable with patient and daughter.

## 2013-06-30 ENCOUNTER — Other Ambulatory Visit: Payer: Medicare Other

## 2013-07-05 ENCOUNTER — Ambulatory Visit (HOSPITAL_COMMUNITY)
Admission: RE | Admit: 2013-07-05 | Discharge: 2013-07-05 | Disposition: A | Discharge status: Home / Self Care | Payer: Medicare Other | Source: Ambulatory Visit | Attending: Hematology and Oncology | Admitting: Hematology and Oncology

## 2013-07-05 ENCOUNTER — Encounter: Payer: Self-pay | Admitting: *Deleted

## 2013-07-05 ENCOUNTER — Other Ambulatory Visit (HOSPITAL_BASED_OUTPATIENT_CLINIC_OR_DEPARTMENT_OTHER): Payer: Medicare Other | Admitting: Lab

## 2013-07-05 ENCOUNTER — Ambulatory Visit (HOSPITAL_BASED_OUTPATIENT_CLINIC_OR_DEPARTMENT_OTHER): Payer: Medicare Other | Admitting: Hematology and Oncology

## 2013-07-05 ENCOUNTER — Other Ambulatory Visit: Payer: Self-pay | Admitting: *Deleted

## 2013-07-05 ENCOUNTER — Ambulatory Visit (HOSPITAL_BASED_OUTPATIENT_CLINIC_OR_DEPARTMENT_OTHER): Payer: Medicare Other

## 2013-07-05 ENCOUNTER — Other Ambulatory Visit: Payer: Self-pay | Admitting: Hematology and Oncology

## 2013-07-05 ENCOUNTER — Encounter: Payer: Self-pay | Admitting: Hematology and Oncology

## 2013-07-05 VITALS — BP 153/66 | HR 68 | Temp 98.1°F | Resp 18 | Ht 61.0 in | Wt 130.0 lb

## 2013-07-05 VITALS — BP 153/66 | HR 68 | Temp 98.1°F | Resp 18

## 2013-07-05 DIAGNOSIS — D649 Anemia, unspecified: Secondary | ICD-10-CM

## 2013-07-05 DIAGNOSIS — D46C Myelodysplastic syndrome with isolated del(5q) chromosomal abnormality: Secondary | ICD-10-CM

## 2013-07-05 DIAGNOSIS — D539 Nutritional anemia, unspecified: Secondary | ICD-10-CM

## 2013-07-05 DIAGNOSIS — Z23 Encounter for immunization: Secondary | ICD-10-CM

## 2013-07-05 LAB — PREPARE RBC (CROSSMATCH)

## 2013-07-05 LAB — CBC WITH DIFFERENTIAL/PLATELET
BASO%: 4.4 % — ABNORMAL HIGH (ref 0.0–2.0)
EOS%: 19.2 % — ABNORMAL HIGH (ref 0.0–7.0)
MCH: 29 pg (ref 25.1–34.0)
MCHC: 33 g/dL (ref 31.5–36.0)
RDW: 19.7 % — ABNORMAL HIGH (ref 11.2–14.5)
lymph#: 1.1 10*3/uL (ref 0.9–3.3)

## 2013-07-05 MED ORDER — ACETAMINOPHEN 325 MG PO TABS
650.0000 mg | ORAL_TABLET | Freq: Once | ORAL | Status: AC
  Administered 2013-07-05: 650 mg via ORAL

## 2013-07-05 MED ORDER — ACETAMINOPHEN 325 MG PO TABS
ORAL_TABLET | ORAL | Status: AC
  Filled 2013-07-05: qty 2

## 2013-07-05 MED ORDER — SODIUM CHLORIDE 0.9 % IJ SOLN
10.0000 mL | INTRAMUSCULAR | Status: DC | PRN
  Filled 2013-07-05: qty 10

## 2013-07-05 MED ORDER — INFLUENZA VAC SPLIT QUAD 0.5 ML IM SUSP
0.5000 mL | INTRAMUSCULAR | Status: DC
  Filled 2013-07-05: qty 0.5

## 2013-07-05 MED ORDER — LENALIDOMIDE 5 MG PO CAPS: 5.0000 mg | ORAL_CAPSULE | Freq: Every day | ORAL | Status: DC

## 2013-07-05 MED ORDER — SODIUM CHLORIDE 0.9 % IV SOLN
250.0000 mL | Freq: Once | INTRAVENOUS | Status: AC
  Administered 2013-07-05: 250 mL via INTRAVENOUS

## 2013-07-05 NOTE — Progress Notes (Signed)
Materials and verbal teaching 5q deletion MDS Revlimid 5mg  daily

## 2013-07-05 NOTE — Patient Instructions (Signed)
Blood Transfusion Information WHAT IS A BLOOD TRANSFUSION? A transfusion is the replacement of blood or some of its parts. Blood is made up of multiple cells which provide different functions.  Red blood cells carry oxygen and are used for blood loss replacement.  White blood cells fight against infection.  Platelets control bleeding.  Plasma helps clot blood.  Other blood products are available for specialized needs, such as hemophilia or other clotting disorders. BEFORE THE TRANSFUSION  Who gives blood for transfusions?   You may be able to donate blood to be used at a later date on yourself (autologous donation).  Relatives can be asked to donate blood. This is generally not any safer than if you have received blood from a stranger. The same precautions are taken to ensure safety when a relative's blood is donated.  Healthy volunteers who are fully evaluated to make sure their blood is safe. This is blood bank blood. Transfusion therapy is the safest it has ever been in the practice of medicine. Before blood is taken from a donor, a complete history is taken to make sure that person has no history of diseases nor engages in risky social behavior (examples are intravenous drug use or sexual activity with multiple partners). The donor's travel history is screened to minimize risk of transmitting infections, such as malaria. The donated blood is tested for signs of infectious diseases, such as HIV and hepatitis. The blood is then tested to be sure it is compatible with you in order to minimize the chance of a transfusion reaction. If you or a relative donates blood, this is often done in anticipation of surgery and is not appropriate for emergency situations. It takes many days to process the donated blood. RISKS AND COMPLICATIONS Although transfusion therapy is very safe and saves many lives, the main dangers of transfusion include:   Getting an infectious disease.  Developing a  transfusion reaction. This is an allergic reaction to something in the blood you were given. Every precaution is taken to prevent this. The decision to have a blood transfusion has been considered carefully by your caregiver before blood is given. Blood is not given unless the benefits outweigh the risks. AFTER THE TRANSFUSION  Right after receiving a blood transfusion, you will usually feel much better and more energetic. This is especially true if your red blood cells have gotten low (anemic). The transfusion raises the level of the red blood cells which carry oxygen, and this usually causes an energy increase.  The nurse administering the transfusion will monitor you carefully for complications. HOME CARE INSTRUCTIONS  No special instructions are needed after a transfusion. You may find your energy is better. Speak with your caregiver about any limitations on activity for underlying diseases you may have. SEEK MEDICAL CARE IF:   Your condition is not improving after your transfusion.  You develop redness or irritation at the intravenous (IV) site. SEEK IMMEDIATE MEDICAL CARE IF:  Any of the following symptoms occur over the next 12 hours:  Shaking chills.  You have a temperature by mouth above 102 F (38.9 C), not controlled by medicine.  Chest, back, or muscle pain.  People around you feel you are not acting correctly or are confused.  Shortness of breath or difficulty breathing.  Dizziness and fainting.  You get a rash or develop hives.  You have a decrease in urine output.  Your urine turns a dark color or changes to pink, red, or brown. Any of the following   symptoms occur over the next 10 days:  You have a temperature by mouth above 102 F (38.9 C), not controlled by medicine.  Shortness of breath.  Weakness after normal activity.  The white part of the eye turns yellow (jaundice).  You have a decrease in the amount of urine or are urinating less often.  Your  urine turns a dark color or changes to pink, red, or brown. Document Released: 09/18/2000 Document Revised: 12/14/2011 Document Reviewed: 05/07/2008 [employer] Patient Information 2014 [employer].  

## 2013-07-05 NOTE — Telephone Encounter (Signed)
Gave pt appt for lab and MD in 3 weeks, october 2014

## 2013-07-05 NOTE — Progress Notes (Signed)
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 She has received 6 units of blood so far, the first in July, the second one in August and most recently today. She denies any bleeding such as epistaxis, hematuria, or hematochezia. 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  INTERVAL HISTORY: Rebekah Cross 79 y.o. female returns for further followup. She complained of profound fatigue prior to the blood transfusion today but since she received 2 units of blood she felt much better. She denies any recent weight loss. No recent fevers chills or cough. Her appetite is stable. As mentioned above, she denies any recent bleeding.  I have reviewed the past medical history, past surgical history, social history and family history with the patient and they are unchanged from previous note.  ALLERGIES:  is allergic to novocain; oxycodone; codeine; and methimazole.  MEDICATIONS: Current outpatient prescriptions:aspirin 81 MG tablet, Take 81 mg by mouth daily.  , Disp: , Rfl: ;  cholecalciferol (VITAMIN D) 400 UNITS TABS, Take 400 Units by mouth daily., Disp: , Rfl: ;  LORazepam (ATIVAN) 0.5 MG tablet, Take 0.5-1 mg by mouth at bedtime. , Disp: , Rfl: ;  metoprolol succinate (TOPROL-XL) 100 MG 24 hr tablet, Take 100 mg by mouth every morning. , Disp: , Rfl:  mometasone (ELOCON) 0.1 % cream, Apply 1 application topically daily as needed. For psoriasis, Disp: , Rfl: ;  Multiple Vitamin (MULTIVITAMIN) tablet, Take 1 tablet by mouth daily.  , Disp: , Rfl: ;  nisoldipine (SULAR) 25.5 MG 24 hr tablet, Take 25.5 mg by mouth daily.  , Disp: , Rfl: ;  olmesartan-hydrochlorothiazide (BENICAR HCT) 40-25 MG per tablet,  Take 1 tablet by mouth every morning. , Disp: , Rfl:  potassium chloride SA (K-DUR,KLOR-CON) 20 MEQ tablet, Take 20 mEq by mouth daily., Disp: , Rfl: ;  pravastatin (PRAVACHOL) 40 MG tablet, Take 40 mg by mouth at bedtime. , Disp: , Rfl: ;  acitretin (SORIATANE) 25 MG capsule, Take 25 mg by mouth every Monday, Wednesday, and Friday. , Disp: , Rfl: ;  lenalidomide (REVLIMID) 5 MG capsule, Take 1 capsule (5 mg total) by mouth daily., Disp: 30 capsule, Rfl: 0 triamcinolone cream (KENALOG) 0.1 %, Apply 1 application topically daily., Disp: , Rfl:  Current facility-administered medications:[START ON 07/06/2013] influenza vac split quadrivalent PF (FLUARIX) injection 0.5 mL, 0.5 mL, Intramuscular, Tomorrow-1000, Yoceline Bazar, MD Facility-Administered Medications Ordered in Other Visits: sodium chloride 0.9 % injection 10 mL, 10 mL, Intracatheter, PRN, Myra Rude, MD  REVIEW OF SYSTEMS:   Constitutional: Denies fevers, chills or abnormal weight loss Eyes: Denies blurriness of vision Ears, nose, mouth, throat, and face: Denies mucositis or sore throat Respiratory: Denies cough, dyspnea or wheezes Cardiovascular: Denies palpitation, chest discomfort or lower extremity swelling Gastrointestinal:  Denies nausea, heartburn or change in bowel habits Skin: Denies abnormal skin rashes Lymphatics: Denies new lymphadenopathy or easy bruising Neurological:Denies numbness, tingling or new weaknesses Behavioral/Psych: Mood is stable, no new changes  All other systems were reviewed with the patient and are negative.  PHYSICAL EXAMINATION: ECOG PERFORMANCE STATUS: 1 - Symptomatic but completely ambulatory  Filed Vitals:   07/05/13 1551  BP: 153/66  Pulse: 68  Temp: 98.1 F (36.7 C)  Resp:  18   Filed Weights   07/05/13 1551  Weight: 130 lb (58.968 kg)    GENERAL:alert, no distress and comfortable SKIN: skin color, texture, turgor are normal, no rashes or significant lesions EYES: normal,  Conjunctiva are pink and non-injected, sclera clear OROPHARYNX:no exudate, no erythema and lips, buccal mucosa, and tongue normal  NECK: supple, thyroid normal size, non-tender, without nodularity LYMPH:  no palpable lymphadenopathy in the cervical, axillary or inguinal LUNGS: clear to auscultation and percussion with normal breathing effort HEART: regular rate & rhythm and no murmurs and no lower extremity edema ABDOMEN:abdomen soft, non-tender and normal bowel sounds Musculoskeletal:no cyanosis of digits and no clubbing  NEURO: alert & oriented x 3 with fluent speech, no focal motor/sensory deficits  LABORATORY DATA:  I have reviewed the data as listed    Component Value Date/Time   NA 141 04/29/2013 0532   K 4.4 04/29/2013 0532   CL 106 04/29/2013 0532   CO2 27 04/29/2013 0532   GLUCOSE 126* 04/29/2013 0532   BUN 22 04/29/2013 0532   CREATININE 0.68 04/29/2013 0532   CALCIUM 9.7 04/29/2013 0532   PROT 7.1 08/25/2012 1747   ALBUMIN 4.3 08/25/2012 1747   AST 17 08/25/2012 1747   ALT 17 08/25/2012 1747   ALKPHOS 66 08/25/2012 1747   BILITOT 0.8 08/25/2012 1747   GFRNONAA 77* 04/29/2013 0532   GFRAA 89* 04/29/2013 0532    I No results found for this basename: SPEP, UPEP,  kappa and lambda light chains    Lab Results  Component Value Date   WBC 4.9 07/05/2013   NEUTROABS 2.4 07/05/2013   HGB 7.8* 07/05/2013   HCT 23.7* 07/05/2013   MCV 87.9 07/05/2013   PLT 432* 07/05/2013      Chemistry      Component Value Date/Time   NA 141 04/29/2013 0532   K 4.4 04/29/2013 0532   CL 106 04/29/2013 0532   CO2 27 04/29/2013 0532   BUN 22 04/29/2013 0532   CREATININE 0.68 04/29/2013 0532      Component Value Date/Time   CALCIUM 9.7 04/29/2013 0532   ALKPHOS 66 08/25/2012 1747   AST 17 08/25/2012 1747   ALT 17 08/25/2012 1747   BILITOT 0.8 08/25/2012 1747      ASSESSMENT: 5 q. minus myelodysplastic syndrome with anemia  PLAN:  #1 myelodysplastic syndrome with severe anemia She has  received 2 units of blood today. Given the cytogenetic abnormalities, I recommend consideration to try Revlimid. We discussed some of the risks, benefits, side effects of treatment including risk of blood clots, secondary malignancy, pancytopenia and risk of infection and she is in agreement to proceed. She went ahead and sign consent today. If she cannot get this medication approved over the next 10 days, I recommend consideration for erythropoietin stimulating agents. Today she received 2 units of blood transfusion. She felt much better. We will observe closely. #2 preventive care I recommend influenza vaccination today. She is in agreement. We will go ahead administered today.  All questions were answered. The patient knows to call the clinic with any problems, questions or concerns. We can certainly see the patient much sooner if necessary. No barriers to learning was detected. I spent 40 minutes counseling the patient face to face. The total time spent in the appointment was 60 minutes and more than 50% was on counseling and review of test results     [facility], Gladine Plude, MD 07/05/2013 4:25 PM

## 2013-07-05 NOTE — Telephone Encounter (Signed)
[employer] registration complete online. Faxed Rx to [employer].

## 2013-07-06 ENCOUNTER — Other Ambulatory Visit: Payer: Medicare Other

## 2013-07-06 LAB — TYPE AND SCREEN

## 2013-07-07 ENCOUNTER — Telehealth: Payer: Self-pay | Admitting: *Deleted

## 2013-07-07 ENCOUNTER — Other Ambulatory Visit: Payer: Medicare Other

## 2013-07-07 NOTE — Telephone Encounter (Signed)
Faxed information to [place] at [employer] for needed prior authorization on Revlimid

## 2013-07-11 ENCOUNTER — Other Ambulatory Visit: Payer: Self-pay | Admitting: Hematology and Oncology

## 2013-07-11 ENCOUNTER — Encounter: Payer: Self-pay | Admitting: Hematology and Oncology

## 2013-07-11 DIAGNOSIS — D46C Myelodysplastic syndrome with isolated del(5q) chromosomal abnormality: Secondary | ICD-10-CM

## 2013-07-11 HISTORY — DX: Myelodysplastic syndrome with isolated del(5q) chromosomal abnormality: D46.C

## 2013-07-13 ENCOUNTER — Encounter: Payer: Self-pay | Admitting: *Deleted

## 2013-07-13 NOTE — Progress Notes (Signed)
Fax rec'd from [employer]. They shipped Revlimid to pt on 10/06 for delivery on next business day.

## 2013-07-14 ENCOUNTER — Telehealth: Payer: Self-pay | Admitting: Hematology and Oncology

## 2013-07-14 NOTE — Telephone Encounter (Signed)
lmonvm for pt re appt for 10/10. Also confirmed 10/22 appt. Mailed schedule.

## 2013-07-18 ENCOUNTER — Other Ambulatory Visit (HOSPITAL_BASED_OUTPATIENT_CLINIC_OR_DEPARTMENT_OTHER): Payer: Medicare Other | Admitting: Lab

## 2013-07-18 ENCOUNTER — Ambulatory Visit: Payer: Medicare Other

## 2013-07-18 DIAGNOSIS — Z23 Encounter for immunization: Secondary | ICD-10-CM

## 2013-07-18 DIAGNOSIS — D649 Anemia, unspecified: Secondary | ICD-10-CM

## 2013-07-18 LAB — HOLD TUBE, BLOOD BANK

## 2013-07-18 LAB — CBC WITH DIFFERENTIAL/PLATELET
BASO%: 2.8 % — ABNORMAL HIGH (ref 0.0–2.0)
Basophils Absolute: 0.1 10*3/uL (ref 0.0–0.1)
EOS%: 37.9 % — ABNORMAL HIGH (ref 0.0–7.0)
HCT: 26.9 % — ABNORMAL LOW (ref 34.8–46.6)
LYMPH%: 21.6 % (ref 14.0–49.7)
MCH: 28.9 pg (ref 25.1–34.0)
MCHC: 33.4 g/dL (ref 31.5–36.0)
MONO#: 0 10*3/uL — ABNORMAL LOW (ref 0.1–0.9)
MONO%: 1 % (ref 0.0–14.0)
Platelets: 298 10*3/uL (ref 145–400)
RBC: 3.1 10*6/uL — ABNORMAL LOW (ref 3.70–5.45)
WBC: 4.8 10*3/uL (ref 3.9–10.3)

## 2013-07-18 LAB — FERRITIN CHCC: Ferritin: 457 ng/ml — ABNORMAL HIGH (ref 9–269)

## 2013-07-18 NOTE — Progress Notes (Signed)
Pt. stated that she has developed itchiness and has small red botchy spots all over. Pt. Has stopped her psorisis meds by Dr. Terri Piedra and is scheduled to restart tomorrow. Informed both pt. And daughter to monitor spots and report further issues. Pt. Also started Revlimid last week.  Area of dryness resemble psorisis. hgb 9.0. Per Dr. Bertis Ruddy no blood today. HL

## 2013-07-18 NOTE — Patient Instructions (Addendum)
Call if you have further issues in regards to skin itchiness or increase spots. Notified Dr. Dorita Sciara office regard this issue.  Skin care reviewed with both daughter and pt.  Call [facility] [phone], if you have issues with bleeding or increase symptoms of shortness of breathe or dizziness. Call 911 or go to the nearest emergency room if you have any urgent matter require immediate attention.   Hgb 9.0 today. No transfusion required.

## 2013-07-19 ENCOUNTER — Emergency Department (HOSPITAL_COMMUNITY)
Admission: EM | Admit: 2013-07-19 | Discharge: 2013-07-20 | Disposition: A | Discharge status: Home / Self Care | Payer: Medicare Other | Attending: Emergency Medicine | Admitting: Emergency Medicine

## 2013-07-19 ENCOUNTER — Encounter (HOSPITAL_COMMUNITY): Payer: Self-pay | Admitting: Emergency Medicine

## 2013-07-19 DIAGNOSIS — Z7982 Long term (current) use of aspirin: Secondary | ICD-10-CM | POA: Insufficient documentation

## 2013-07-19 DIAGNOSIS — IMO0002 Reserved for concepts with insufficient information to code with codable children: Secondary | ICD-10-CM | POA: Insufficient documentation

## 2013-07-19 DIAGNOSIS — E7800 Pure hypercholesterolemia, unspecified: Secondary | ICD-10-CM | POA: Insufficient documentation

## 2013-07-19 DIAGNOSIS — Z862 Personal history of diseases of the blood and blood-forming organs and certain disorders involving the immune mechanism: Secondary | ICD-10-CM | POA: Insufficient documentation

## 2013-07-19 DIAGNOSIS — Z79899 Other long term (current) drug therapy: Secondary | ICD-10-CM | POA: Insufficient documentation

## 2013-07-19 DIAGNOSIS — L299 Pruritus, unspecified: Secondary | ICD-10-CM | POA: Insufficient documentation

## 2013-07-19 DIAGNOSIS — R21 Rash and other nonspecific skin eruption: Secondary | ICD-10-CM | POA: Insufficient documentation

## 2013-07-19 DIAGNOSIS — T50995A Adverse effect of other drugs, medicaments and biological substances, initial encounter: Secondary | ICD-10-CM | POA: Insufficient documentation

## 2013-07-19 DIAGNOSIS — I10 Essential (primary) hypertension: Secondary | ICD-10-CM | POA: Insufficient documentation

## 2013-07-19 DIAGNOSIS — T7840XA Allergy, unspecified, initial encounter: Secondary | ICD-10-CM

## 2013-07-19 MED ORDER — FAMOTIDINE 20 MG PO TABS
40.0000 mg | ORAL_TABLET | Freq: Once | ORAL | Status: AC
  Administered 2013-07-19: 40 mg via ORAL
  Filled 2013-07-19: qty 2

## 2013-07-19 MED ORDER — DIPHENHYDRAMINE HCL 50 MG/ML IJ SOLN
50.0000 mg | Freq: Once | INTRAMUSCULAR | Status: AC
  Administered 2013-07-19: 50 mg via INTRAMUSCULAR
  Filled 2013-07-19: qty 1

## 2013-07-19 MED ORDER — PREDNISONE 20 MG PO TABS
60.0000 mg | ORAL_TABLET | Freq: Once | ORAL | Status: AC
  Administered 2013-07-19: 60 mg via ORAL
  Filled 2013-07-19: qty 3

## 2013-07-19 NOTE — ED Notes (Signed)
Reulimid started 1 week ago today, started breaking out with rash on Saturday, rash has increased today. C/o itching all over. Red spots noted all over body.

## 2013-07-19 NOTE — ED Notes (Signed)
Patient with c/o rash, itching to back "for several days." Patient believes that rash is related to starting new medication (Revlimid), which she started on 07/11/2013 Patient able to speak in full, complete sentences without difficulty Patient denies SOB, difficulty breathing, difficulty swallowing or throat tightening Patient appears in NAD

## 2013-07-19 NOTE — ED Provider Notes (Signed)
CSN: [id number]     Arrival date & time 07/19/13  2221 History   First MD Initiated Contact with Patient 07/19/13 2345     Chief Complaint  Patient presents with  . Allergic Reaction   (Consider location/radiation/quality/duration/timing/severity/associated sxs/prior Treatment) Patient is a 79 y.o. female presenting with allergic reaction. The history is provided by the patient and a relative.  Allergic Reaction  patient here with 3 days of diffuse whole-body rash with associated pruritus. Symptoms started after she started taking a new medication for her bone marrow. Denies any fever. Has been using Benadryl without relief. Called her doctor and told to come here. Denies any dyspnea. No throat swelling or throat tightening. Denies any oral involvement. No prior history of same.  Past Medical History  Diagnosis Date  . Anemia, unspecified   . Hypertension   . Thyroid disease   . Hypercholesteremia   . MDS (myelodysplastic syndrome) with 5q deletion 07/11/2013   Past Surgical History  Procedure Laterality Date  . Abdominal hysterectomy    . Back surgery    . Arthroscopy right knee    . Arthroscopy left knee     Family History  Problem Relation Age of Onset  . Colon cancer Daughter   . Heart disease Maternal Grandmother   . Heart disease Paternal Grandfather    History  Substance Use Topics  . Smoking status: Never Smoker   . Smokeless tobacco: Never Used  . Alcohol Use: No   OB History   Grav Para Term Preterm Abortions TAB SAB Ect Mult Living                 Review of Systems  All other systems reviewed and are negative.    Allergies  Novocain; Oxycodone; Codeine; and Methimazole  Home Medications   Current Outpatient Rx  Name  Route  Sig  Dispense  Refill  . acitretin (SORIATANE) 25 MG capsule   Oral   Take 25 mg by mouth every Monday, Wednesday, and Friday.          Marland Kitchen aspirin 81 MG tablet   Oral   Take 81 mg by mouth daily.           .  cholecalciferol (VITAMIN D) 400 UNITS TABS   Oral   Take 400 Units by mouth daily.         Marland Kitchen lenalidomide (REVLIMID) 5 MG capsule   Oral   Take 1 capsule (5 mg total) by mouth daily.   28 capsule   0     Female NOT of childbearing potential. Auth # [id number] ...   . LORazepam (ATIVAN) 0.5 MG tablet   Oral   Take 0.5-1 mg by mouth at bedtime.          . metoprolol succinate (TOPROL-XL) 100 MG 24 hr tablet   Oral   Take 100 mg by mouth every morning.          . mometasone (ELOCON) 0.1 % cream   Topical   Apply 1 application topically daily as needed. For psoriasis         . Multiple Vitamin (MULTIVITAMIN) tablet   Oral   Take 1 tablet by mouth daily.           . nisoldipine (SULAR) 25.5 MG 24 hr tablet   Oral   Take 25.5 mg by mouth daily.           Marland Kitchen olmesartan-hydrochlorothiazide (BENICAR HCT) 40-25 MG per tablet   Oral  Take 1 tablet by mouth every morning.          . potassium chloride SA (K-DUR,KLOR-CON) 20 MEQ tablet   Oral   Take 20 mEq by mouth daily.         . pravastatin (PRAVACHOL) 40 MG tablet   Oral   Take 40 mg by mouth at bedtime.          . triamcinolone cream (KENALOG) 0.1 %   Topical   Apply 1 application topically daily.          BP 159/42  Pulse 65  Temp(Src) 98.2 F (36.8 C) (Oral)  Resp 18  Ht 5' (1.524 m)  Wt 127 lb (57.607 kg)  BMI 24.8 kg/m2  SpO2 96% Physical Exam  Nursing note and vitals reviewed. Constitutional: She is oriented to person, place, and time. She appears well-developed and well-nourished.  Non-toxic appearance. No distress.  HENT:  Head: Normocephalic and atraumatic.  Eyes: Conjunctivae, EOM and lids are normal. Pupils are equal, round, and reactive to light.  Neck: Normal range of motion. Neck supple. No tracheal deviation present. No mass present.  Cardiovascular: Normal rate, regular rhythm and normal heart sounds.  Exam reveals no gallop.   No murmur heard. Pulmonary/Chest: Effort normal and  breath sounds normal. No stridor. No respiratory distress. She has no decreased breath sounds. She has no wheezes. She has no rhonchi. She has no rales.  Abdominal: Soft. Normal appearance and bowel sounds are normal. She exhibits no distension. There is no tenderness. There is no rebound and no CVA tenderness.  Musculoskeletal: Normal range of motion. She exhibits no edema and no tenderness.  Neurological: She is alert and oriented to person, place, and time. She has normal strength. No cranial nerve deficit or sensory deficit. GCS eye subscore is 4. GCS verbal subscore is 5. GCS motor subscore is 6.  Skin: Skin is warm and dry. Rash noted. No abrasion noted. Rash is maculopapular and urticarial.  Diffuse whole-body hive-like rash without intraoral or lip involvement  Psychiatric: She has a normal mood and affect. Her speech is normal and behavior is normal.    ED Course  Procedures (including critical care time) Labs Review Labs Reviewed - No data to display Imaging Review No results found.  EKG Interpretation   None       MDM  No diagnosis found. Patient given Benadryl and prednisone. Rash has improved and she feels better. Patient will stop taking the medication that causes as well as will take Benadryl when necessary along with her prednisone.    Toy Baker, MD 07/20/13 [phone]

## 2013-07-20 ENCOUNTER — Telehealth: Payer: Self-pay | Admitting: Hematology and Oncology

## 2013-07-20 ENCOUNTER — Telehealth: Payer: Self-pay | Admitting: *Deleted

## 2013-07-20 MED ORDER — PREDNISONE 10 MG PO TABS: 20.0000 mg | ORAL_TABLET | Freq: Every day | ORAL | Status: DC

## 2013-07-20 MED ORDER — HYDROXYZINE HCL 25 MG PO TABS: 25.0000 mg | ORAL_TABLET | ORAL | Status: DC | PRN

## 2013-07-20 NOTE — Telephone Encounter (Signed)
Informed daughter of appt tomorrow at 11:30 am.  She verbalized understanding.

## 2013-07-20 NOTE — Telephone Encounter (Signed)
VM from daughter to report pt seen in ED last night for rash.  Pt told she is allergic to Revlimid and told to stop it.  Pt given rx for prednisone and vistaril.  Rash is improving since ED visit.

## 2013-07-20 NOTE — Telephone Encounter (Signed)
added 10.17.14 appt...pt aware per pof

## 2013-07-20 NOTE — Telephone Encounter (Signed)
Message copied by Wende Mott on Thu Jul 20, 2013  3:59 PM ------      Message from: [facility], [place]      Created: Thu Jul 20, 2013  3:53 PM       Please get her to see me tomorrow at 1130 am       Thanks ------

## 2013-07-21 ENCOUNTER — Telehealth: Payer: Self-pay | Admitting: Hematology and Oncology

## 2013-07-21 ENCOUNTER — Encounter: Payer: Self-pay | Admitting: Hematology and Oncology

## 2013-07-21 ENCOUNTER — Ambulatory Visit (HOSPITAL_BASED_OUTPATIENT_CLINIC_OR_DEPARTMENT_OTHER): Payer: Medicare Other | Admitting: Hematology and Oncology

## 2013-07-21 ENCOUNTER — Other Ambulatory Visit: Payer: Self-pay | Admitting: *Deleted

## 2013-07-21 VITALS — BP 153/61 | HR 66 | Temp 96.7°F | Resp 18 | Ht 60.0 in | Wt 131.1 lb

## 2013-07-21 DIAGNOSIS — D46C Myelodysplastic syndrome with isolated del(5q) chromosomal abnormality: Secondary | ICD-10-CM

## 2013-07-21 DIAGNOSIS — D649 Anemia, unspecified: Secondary | ICD-10-CM

## 2013-07-21 MED ORDER — HYDROXYZINE HCL 25 MG PO TABS: 25.0000 mg | ORAL_TABLET | ORAL | Status: DC | PRN

## 2013-07-21 MED ORDER — PREDNISONE 5 MG PO TABS: 5.0000 mg | ORAL_TABLET | Freq: Every day | ORAL | Status: DC

## 2013-07-21 NOTE — Telephone Encounter (Signed)
Gave pt appt for lab and MD for October 2014

## 2013-07-21 NOTE — Progress Notes (Signed)
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 She has received 6 units of blood so far, the first in July, the second one in August and most recently today. She denies any bleeding such as epistaxis, hematuria, or hematochezia. 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 Erythropoietin stimulating agents were stopped. In October 2014, she was started on Revlimid  INTERVAL HISTORY: Rebekah Cross 79 y.o. female returns for further followup. She has only taken 3 or 4 doses of Revlimid when she has diffuse rash throughout her body which is disturbing to profound itchiness. She went to the emergency department and was treated with Benadryl and corticosteroids as well as anti-itching medications. Revlimid was discontinued. Since then, her rash and skin itchiness has improved. While on Revlimid, her energy level is fair. She denies any recent fever, chills, night sweats or abnormal weight loss  I have reviewed the past medical history, past surgical history, social history and family history with the patient and they are unchanged from previous note.  ALLERGIES:  is allergic to novocain; oxycodone; codeine; and methimazole.  MEDICATIONS:  Current Outpatient Prescriptions  Medication Sig Dispense Refill  . acitretin (SORIATANE) 25 MG capsule Take 25 mg by mouth every Monday, Wednesday, and Friday.       Marland Kitchen aspirin 81 MG tablet Take 81 mg by mouth daily.        . cholecalciferol (VITAMIN D) 400 UNITS TABS Take 400 Units by mouth daily.      . hydrOXYzine (ATARAX/VISTARIL) 25 MG tablet Take 1 tablet (25 mg total) by mouth every 4 (four)  hours as needed for itching.  60 tablet  3  . LORazepam (ATIVAN) 0.5 MG tablet Take 0.5-1 mg by mouth at bedtime.       . metoprolol succinate (TOPROL-XL) 100 MG 24 hr tablet Take 100 mg by mouth every morning.       . mometasone (ELOCON) 0.1 % cream Apply 1 application topically daily as needed. For psoriasis      . Multiple Vitamin (MULTIVITAMIN) tablet Take 1 tablet by mouth daily.        . nisoldipine (SULAR) 25.5 MG 24 hr tablet Take 25.5 mg by mouth daily.        Marland Kitchen olmesartan-hydrochlorothiazide (BENICAR HCT) 40-25 MG per tablet Take 1 tablet by mouth every morning.       . potassium chloride SA (K-DUR,KLOR-CON) 20 MEQ tablet Take 20 mEq by mouth daily.      . pravastatin (PRAVACHOL) 40 MG tablet Take 40 mg by mouth at bedtime.       . triamcinolone cream (KENALOG) 0.1 % Apply 1 application topically daily.      . predniSONE (DELTASONE) 5 MG tablet Take 1 tablet (5 mg total) by mouth daily.  90 tablet  0   No current facility-administered medications for this visit.    REVIEW OF SYSTEMS:   Constitutional: Denies fevers, chills or abnormal weight loss Eyes: Denies blurriness of vision Ears, nose, mouth, throat, and face: Denies mucositis or sore throat Respiratory: Denies cough, dyspnea or wheezes Cardiovascular: Denies palpitation, chest discomfort or lower extremity swelling Gastrointestinal:  Denies nausea, heartburn or change in  bowel habits Neurological:Denies numbness, tingling or new weaknesses Behavioral/Psych: Mood is stable, no new changes  All other systems were reviewed with the patient and are negative.  PHYSICAL EXAMINATION: ECOG PERFORMANCE STATUS: 1 - Symptomatic but completely ambulatory  Filed Vitals:   07/21/13 1126  BP: 153/61  Pulse: 66  Temp: 96.7 F (35.9 C)  Resp: 18   Filed Weights   07/21/13 1126  Weight: 131 lb 1.6 oz (59.467 kg)    GENERAL:alert, no distress and comfortable SKIN: Diffuse hives throughout her body resembling drug rash EYES:  normal, Conjunctiva are pink and non-injected, sclera clear OROPHARYNX:no exudate, no erythema and lips, buccal mucosa, and tongue normal  NECK: supple, thyroid normal size, non-tender, without nodularity LYMPH:  no palpable lymphadenopathy in the cervical, axillary or inguinal LUNGS: clear to auscultation and percussion with normal breathing effort HEART: regular rate & rhythm and no murmurs and no lower extremity edema ABDOMEN:abdomen soft, non-tender and normal bowel sounds Musculoskeletal:no cyanosis of digits and no clubbing  NEURO: alert & oriented x 3 with fluent speech, no focal motor/sensory deficits  LABORATORY DATA:  I have reviewed the data as listed    Component Value Date/Time   NA 141 04/29/2013 0532   K 4.4 04/29/2013 0532   CL 106 04/29/2013 0532   CO2 27 04/29/2013 0532   GLUCOSE 126* 04/29/2013 0532   BUN 22 04/29/2013 0532   CREATININE 0.68 04/29/2013 0532   CALCIUM 9.7 04/29/2013 0532   PROT 7.1 08/25/2012 1747   ALBUMIN 4.3 08/25/2012 1747   AST 17 08/25/2012 1747   ALT 17 08/25/2012 1747   ALKPHOS 66 08/25/2012 1747   BILITOT 0.8 08/25/2012 1747   GFRNONAA 77* 04/29/2013 0532   GFRAA 89* 04/29/2013 0532    No results found for this basename: SPEP,  UPEP,   kappa and lambda light chains    Lab Results  Component Value Date   WBC 4.8 07/18/2013   NEUTROABS 1.7 07/18/2013   HGB 9.0* 07/18/2013   HCT 26.9* 07/18/2013   MCV 86.7 07/18/2013   PLT 298 07/18/2013      Chemistry      Component Value Date/Time   NA 141 04/29/2013 0532   K 4.4 04/29/2013 0532   CL 106 04/29/2013 0532   CO2 27 04/29/2013 0532   BUN 22 04/29/2013 0532   CREATININE 0.68 04/29/2013 0532      Component Value Date/Time   CALCIUM 9.7 04/29/2013 0532   ALKPHOS 66 08/25/2012 1747   AST 17 08/25/2012 1747   ALT 17 08/25/2012 1747   BILITOT 0.8 08/25/2012 1747     ASSESSMENT:  Myelodysplastic syndrome with 5Q minus deletion and anemia, intolerant to Revlimid due to allergic skin  reaction  PLAN:  #1 myelodysplastic syndrome Her most recent erythropoietin level was over 500. Erythropoietin stimulating agents are not going to be helpful. We gave her a trauma of Revlimid but she developed allergic reaction to this. At this point in time, we can give her a trial of low-dose prednisone. The patient will continue her followup blood work on a weekly basis and see her back in 2 weeks for further assessment. #2 allergic reaction to Revlimid We would discontinue Revlimid I've refilled her prescription of anti-itching medicine and give her instructions for steroid taper. #3 severe anemia This is likely anemia due to her disease. The patient denies recent history of bleeding such as epistaxis, hematuria or hematochezia. She is asymptomatic from the anemia. We will observe for now.  she does not require transfusion now.   Orders Placed This Encounter  Procedures  . CBC with Differential    Standing Status: Standing     Number of Occurrences: 9     Standing Expiration Date: 07/21/2014  . Hold Tube, Blood Bank    Only transfuse less than 7g    Standing Status: Standing     Number of Occurrences: 9     Standing Expiration Date: 07/21/2014   All questions were answered. The patient knows to call the clinic with any problems, questions or concerns. No barriers to learning was detected. I spent 25 minutes counseling the patient face to face. The total time spent in the appointment was 40 minutes and more than 50% was on counseling and review of test results     [facility], Rebekah [place], MD 07/21/2013 1:33 PM

## 2013-07-26 ENCOUNTER — Telehealth: Payer: Self-pay | Admitting: Medical Oncology

## 2013-07-26 ENCOUNTER — Ambulatory Visit: Payer: Medicare Other | Admitting: Hematology and Oncology

## 2013-07-26 ENCOUNTER — Telehealth: Payer: Self-pay | Admitting: *Deleted

## 2013-07-26 ENCOUNTER — Other Ambulatory Visit: Payer: Medicare Other | Admitting: Lab

## 2013-07-26 ENCOUNTER — Other Ambulatory Visit: Payer: Self-pay | Admitting: Hematology and Oncology

## 2013-07-26 ENCOUNTER — Other Ambulatory Visit (HOSPITAL_BASED_OUTPATIENT_CLINIC_OR_DEPARTMENT_OTHER): Payer: Medicare Other | Admitting: Lab

## 2013-07-26 ENCOUNTER — Ambulatory Visit (HOSPITAL_BASED_OUTPATIENT_CLINIC_OR_DEPARTMENT_OTHER): Payer: Medicare Other

## 2013-07-26 VITALS — BP 147/63 | HR 61 | Temp 96.9°F | Resp 20

## 2013-07-26 DIAGNOSIS — D649 Anemia, unspecified: Secondary | ICD-10-CM

## 2013-07-26 DIAGNOSIS — D46C Myelodysplastic syndrome with isolated del(5q) chromosomal abnormality: Secondary | ICD-10-CM

## 2013-07-26 LAB — HOLD TUBE, BLOOD BANK

## 2013-07-26 LAB — CBC WITH DIFFERENTIAL/PLATELET
BASO%: 1.4 % (ref 0.0–2.0)
Basophils Absolute: 0.1 10*3/uL (ref 0.0–0.1)
EOS%: 12.4 % — ABNORMAL HIGH (ref 0.0–7.0)
HCT: 22.6 % — ABNORMAL LOW (ref 34.8–46.6)
LYMPH%: 16.6 % (ref 14.0–49.7)
MCH: 28.8 pg (ref 25.1–34.0)
MCHC: 33.7 g/dL (ref 31.5–36.0)
MONO#: 0.2 10*3/uL (ref 0.1–0.9)
MONO%: 4.7 % (ref 0.0–14.0)
NEUT%: 64.9 % (ref 38.4–76.8)
Platelets: 274 10*3/uL (ref 145–400)
RBC: 2.64 10*6/uL — ABNORMAL LOW (ref 3.70–5.45)
WBC: 3.8 10*3/uL — ABNORMAL LOW (ref 3.9–10.3)
lymph#: 0.6 10*3/uL — ABNORMAL LOW (ref 0.9–3.3)

## 2013-07-26 LAB — TECHNOLOGIST REVIEW

## 2013-07-26 MED ORDER — ACETAMINOPHEN 325 MG PO TABS
ORAL_TABLET | ORAL | Status: AC
  Filled 2013-07-26: qty 2

## 2013-07-26 MED ORDER — SODIUM CHLORIDE 0.9 % IV SOLN
250.0000 mL | Freq: Once | INTRAVENOUS | Status: AC
  Administered 2013-07-26: 250 mL via INTRAVENOUS

## 2013-07-26 MED ORDER — ACETAMINOPHEN 325 MG PO TABS
650.0000 mg | ORAL_TABLET | Freq: Once | ORAL | Status: AC
Start: 2013-07-26 — End: 2013-07-26
  Administered 2013-07-26: 650 mg via ORAL

## 2013-07-26 MED ORDER — DIPHENHYDRAMINE HCL 25 MG PO CAPS
25.0000 mg | ORAL_CAPSULE | Freq: Once | ORAL | Status: AC
  Administered 2013-07-26: 25 mg via ORAL

## 2013-07-26 MED ORDER — DIPHENHYDRAMINE HCL 25 MG PO CAPS
ORAL_CAPSULE | ORAL | Status: AC
  Filled 2013-07-26: qty 1

## 2013-07-26 NOTE — Telephone Encounter (Signed)
Patient called and over slept. Moving appts from today to tomorrow

## 2013-07-26 NOTE — Telephone Encounter (Signed)
Spoke with daughter, Kara Mead, regarding flu shot for patient on July 05, 2013 visit. Per daughter, patient did not receive a flu shot during that visit.

## 2013-07-26 NOTE — Telephone Encounter (Signed)
Late two notes entered by [facility]

## 2013-07-26 NOTE — Patient Instructions (Signed)
Blood Transfusion  A blood transfusion replaces your blood or some of its parts. Blood is replaced when you have lost blood because of surgery, an accident, or for severe blood conditions like anemia. You can donate blood to be used on yourself if you have a planned surgery. If you lose blood during that surgery, your own blood can be given back to you. Any blood given to you is checked to make sure it matches your blood type. Your temperature, blood pressure, and heart rate (vital signs) will be checked often.  GET HELP RIGHT AWAY IF:   You feel sick to your stomach (nauseous) or throw up (vomit).  You have watery poop (diarrhea).  You have shortness of breath or trouble breathing.  You have blood in your pee (urine) or have dark colored pee.  You have chest pain or tightness.  Your eyes or skin turn yellow (jaundice).  You have a temperature by mouth above 102 F (38.9 C), not controlled by medicine.  You start to shake and have chills.  You develop a a red rash (hives) or feel itchy.  You develop lightheadedness or feel confused.  You develop back, joint, or muscle pain.  You do not feel hungry (lost appetite).  You feel tired, restless, or nervous.  You develop belly (abdominal) cramps. Document Released: 12/18/2008 Document Revised: 12/14/2011 Document Reviewed: 12/18/2008 ExitCare Patient Information 2014 [employer].  

## 2013-07-27 LAB — TYPE AND SCREEN
ABO/RH(D): A POS
Antibody Screen: NEGATIVE
Unit division: 0
Unit division: 0

## 2013-08-02 ENCOUNTER — Telehealth: Payer: Self-pay | Admitting: *Deleted

## 2013-08-02 ENCOUNTER — Encounter: Payer: Self-pay | Admitting: Hematology and Oncology

## 2013-08-02 ENCOUNTER — Ambulatory Visit (HOSPITAL_BASED_OUTPATIENT_CLINIC_OR_DEPARTMENT_OTHER): Payer: Medicare Other | Admitting: Hematology and Oncology

## 2013-08-02 ENCOUNTER — Other Ambulatory Visit (HOSPITAL_BASED_OUTPATIENT_CLINIC_OR_DEPARTMENT_OTHER): Payer: Medicare Other

## 2013-08-02 VITALS — BP 128/53 | HR 67 | Temp 96.8°F | Resp 20 | Ht 60.0 in | Wt 129.6 lb

## 2013-08-02 DIAGNOSIS — D46C Myelodysplastic syndrome with isolated del(5q) chromosomal abnormality: Secondary | ICD-10-CM

## 2013-08-02 DIAGNOSIS — D649 Anemia, unspecified: Secondary | ICD-10-CM

## 2013-08-02 DIAGNOSIS — R21 Rash and other nonspecific skin eruption: Secondary | ICD-10-CM

## 2013-08-02 LAB — CBC WITH DIFFERENTIAL/PLATELET
BASO%: 4.5 % — ABNORMAL HIGH (ref 0.0–2.0)
Basophils Absolute: 0.1 10*3/uL (ref 0.0–0.1)
Eosinophils Absolute: 0.4 10*3/uL (ref 0.0–0.5)
HCT: 29.8 % — ABNORMAL LOW (ref 34.8–46.6)
HGB: 9.9 g/dL — ABNORMAL LOW (ref 11.6–15.9)
LYMPH%: 20.7 % (ref 14.0–49.7)
MCHC: 33.2 g/dL (ref 31.5–36.0)
MCV: 85.9 fL (ref 79.5–101.0)
MONO#: 0.1 10*3/uL (ref 0.1–0.9)
MONO%: 2.3 % (ref 0.0–14.0)
NEUT#: 1.9 10*3/uL (ref 1.5–6.5)
NEUT%: 59.9 % (ref 38.4–76.8)
Platelets: 237 10*3/uL (ref 145–400)
RBC: 3.47 10*6/uL — ABNORMAL LOW (ref 3.70–5.45)
WBC: 3.1 10*3/uL — ABNORMAL LOW (ref 3.9–10.3)
lymph#: 0.6 10*3/uL — ABNORMAL LOW (ref 0.9–3.3)
nRBC: 0 % (ref 0–0)

## 2013-08-02 MED ORDER — ONDANSETRON HCL 8 MG PO TABS: 8.0000 mg | ORAL_TABLET | Freq: Two times a day (BID) | ORAL | Status: DC | PRN

## 2013-08-02 MED ORDER — PROCHLORPERAZINE 25 MG RE SUPP: 25.0000 mg | Freq: Two times a day (BID) | RECTAL | Status: DC | PRN

## 2013-08-02 MED ORDER — PROCHLORPERAZINE MALEATE 10 MG PO TABS: 10.0000 mg | ORAL_TABLET | Freq: Four times a day (QID) | ORAL | Status: DC | PRN

## 2013-08-02 MED ORDER — LORAZEPAM 0.5 MG PO TABS: 0.5000 mg | ORAL_TABLET | Freq: Four times a day (QID) | ORAL | Status: DC | PRN

## 2013-08-02 NOTE — Patient Instructions (Signed)
Azacitidine suspension for injection (subcutaneous use) What is this medicine? AZACITIDINE (ay za SITE i deen) is a chemotherapy drug. This medicine reduces the growth of cancer cells and can suppress the immune system. It is used for treating myelodysplastic syndrome or some types of leukemia. This medicine may be used for other purposes; ask your health care provider or pharmacist if you have questions. What should I tell my health care provider before I take this medicine? They need to know if you have any of these conditions: -infection (especially a virus infection such as chickenpox, cold sores, or herpes) -kidney disease -liver disease -liver tumors -an unusual or allergic reaction to azacitidine, mannitol, other medicines, foods, dyes, or preservatives -pregnant or trying to get pregnant -breast-feeding How should I use this medicine? This medicine is for injection under the skin. It is administered in a hospital or clinic by a specially trained health care professional. Talk to your pediatrician regarding the use of this medicine in children. While this drug may be prescribed for selected conditions, precautions do apply. Overdosage: If you think you have taken too much of this medicine contact a poison control center or emergency room at once. NOTE: This medicine is only for you. Do not share this medicine with others. What if I miss a dose? It is important not to miss your dose. Call your doctor or health care professional if you are unable to keep an appointment. What may interact with this medicine? -vaccines Talk to your doctor or health care professional before taking any of these medicines: -acetaminophen -aspirin -ibuprofen -ketoprofen -naproxen This list may not describe all possible interactions. Give your health care provider a list of all the medicines, herbs, non-prescription drugs, or dietary supplements you use. Also tell them if you smoke, drink alcohol, or use  illegal drugs. Some items may interact with your medicine. What should I watch for while using this medicine? Visit your doctor for checks on your progress. This drug may make you feel generally unwell. This is not uncommon, as chemotherapy can affect healthy cells as well as cancer cells. Report any side effects. Continue your course of treatment even though you feel ill unless your doctor tells you to stop. In some cases, you may be given additional medicines to help with side effects. Follow all directions for their use. Call your doctor or health care professional for advice if you get a fever, chills or sore throat, or other symptoms of a cold or flu. Do not treat yourself. This drug decreases your body's ability to fight infections. Try to avoid being around people who are sick. This medicine may increase your risk to bruise or bleed. Call your doctor or health care professional if you notice any unusual bleeding. Be careful brushing and flossing your teeth or using a toothpick because you may get an infection or bleed more easily. If you have any dental work done, tell your dentist you are receiving this medicine. Avoid taking products that contain aspirin, acetaminophen, ibuprofen, naproxen, or ketoprofen unless instructed by your doctor. These medicines may hide a fever. Do not have any vaccinations without your doctor's approval and avoid anyone who has recently had oral polio vaccine. Do not become pregnant while taking this medicine. Women should inform their doctor if they wish to become pregnant or think they might be pregnant. There is a potential for serious side effects to an unborn child. Talk to your health care professional or pharmacist for more information. Do not breast-feed an  infant while taking this medicine. If you are a man, you should not father a child while receiving treatment. What side effects may I notice from receiving this medicine? Side effects that you should report  to your doctor or health care professional as soon as possible: -allergic reactions like skin rash, itching or hives, swelling of the face, lips, or tongue -low blood counts - this medicine may decrease the number of white blood cells, red blood cells and platelets. You may be at increased risk for infections and bleeding. -signs of infection - fever or chills, cough, sore throat, pain or difficulty passing urine -signs of decreased platelets or bleeding - bruising, pinpoint red spots on the skin, black, tarry stools, blood in the urine -signs of decreased red blood cells - unusually weak or tired, fainting spells, lightheadedness -reactions at the injection site including redness, pain, itching, or bruising -breathing problems -changes in vision -fever -mouth sores -stomach pain -vomiting Side effects that usually do not require medical attention (report to your doctor or health care professional if they continue or are bothersome): -constipation -diarrhea -loss of appetite -nausea -pain or redness at the injection site -weak or tired This list may not describe all possible side effects. Call your doctor for medical advice about side effects. You may report side effects to FDA at 1-800-FDA-1088. Where should I keep my medicine? This drug is given in a hospital or clinic and will not be stored at home. NOTE: This sheet is a summary. It may not cover all possible information. If you have questions about this medicine, talk to your doctor, pharmacist, or health care provider.  2013, [employer]. (12/15/2007 11:04:07 AM)

## 2013-08-02 NOTE — Telephone Encounter (Signed)
Per staff message and POF I have scheduled appts. I have advised scheduler that lab appts need to be moved, and check to see if patient had chemo class. JMW

## 2013-08-02 NOTE — Progress Notes (Signed)
[facility] OFFICE PROGRESS NOTE  Patient Care Team: Lupita Raider, MD as PCP - General (Family Medicine)  DIAGNOSIS: Myelodysplastic syndrome with severe anemia  SUMMARY OF ONCOLOGIC HISTORY: This is a very pleasant 79 year old lady who become transfusion dependent over the last 3 months. She has received 6 units of blood so far, the first in July, the second one in August and most recently today. She denies any bleeding such as epistaxis, hematuria, or hematochezia. 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 Erythropoietin stimulating agents were stopped. In October 2014, she was started on Revlimid however develop severe allergic reaction to Revlimid. That was discontinued.  INTERVAL HISTORY: Rebekah Cross 79 y.o. female returns for further followup. Her skin rash is improving. She is able to taper down on the anti-itching medications. She is still on prednisone 5 mg a day. She denies any recent fever, chills, night sweats or abnormal weight loss The patient denies any recent signs or symptoms of bleeding such as spontaneous epistaxis, hematuria or hematochezia. She received 2 units of blood transfusion recently with improvement of her energy level.  I have reviewed the past medical history, past surgical history, social history and family history with the patient and they are unchanged from previous note.  ALLERGIES:  is allergic to novocain; oxycodone; codeine; methimazole; and revlimid.  MEDICATIONS:  Current Outpatient Prescriptions  Medication Sig Dispense Refill  . acitretin (SORIATANE) 25 MG capsule Take 25 mg by mouth every Monday, Wednesday, and Friday. Off on Nov. 5th for one week then resume.      Marland Kitchen aspirin 81 MG tablet Take 81 mg by mouth daily.        . cholecalciferol (VITAMIN D) 400 UNITS TABS Take 400 Units by mouth daily.      . hydrOXYzine (ATARAX/VISTARIL)  25 MG tablet Take 1 tablet (25 mg total) by mouth every 4 (four) hours as needed for itching.  60 tablet  3  . LORazepam (ATIVAN) 0.5 MG tablet Take 0.5-1 mg by mouth at bedtime.       . metoprolol succinate (TOPROL-XL) 100 MG 24 hr tablet Take 100 mg by mouth every morning.       . mometasone (ELOCON) 0.1 % cream Apply 1 application topically daily as needed. For psoriasis      . Multiple Vitamin (MULTIVITAMIN) tablet Take 1 tablet by mouth daily.        . nisoldipine (SULAR) 25.5 MG 24 hr tablet Take 25.5 mg by mouth daily.        Marland Kitchen olmesartan-hydrochlorothiazide (BENICAR HCT) 40-25 MG per tablet Take 1 tablet by mouth every morning.       . potassium chloride SA (K-DUR,KLOR-CON) 20 MEQ tablet Take 20 mEq by mouth daily.      . pravastatin (PRAVACHOL) 40 MG tablet Take 40 mg by mouth at bedtime.       . predniSONE (DELTASONE) 5 MG tablet Take 1 tablet (5 mg total) by mouth daily.  90 tablet  0  . triamcinolone cream (KENALOG) 0.1 % Apply 1 application topically daily.       No current facility-administered medications for this visit.    REVIEW OF SYSTEMS:   Constitutional: Denies fevers, chills or abnormal weight loss Eyes: Denies blurriness of vision Ears, nose, mouth, throat, and face: Denies mucositis or sore throat Respiratory: Denies cough, dyspnea or wheezes Cardiovascular: Denies palpitation, chest discomfort or lower  extremity swelling Gastrointestinal:  Denies nausea, heartburn or change in bowel habits Lymphatics: Denies new lymphadenopathy or easy bruising Neurological:Denies numbness, tingling or new weaknesses Behavioral/Psych: Mood is stable, no new changes  All other systems were reviewed with the patient and are negative.  PHYSICAL EXAMINATION: ECOG PERFORMANCE STATUS: 1 - Symptomatic but completely ambulatory  Filed Vitals:   08/02/13 0907  BP: 128/53  Pulse: 67  Temp: 96.8 F (36 C)  Resp: 20   Filed Weights   08/02/13 0907  Weight: 129 lb 9.6 oz (58.786  kg)    GENERAL:alert, no distress and comfortable SKIN: Diffuse skin rashes, improved compared to previous visit EYES: normal, Conjunctiva are pink and non-injected, sclera clear OROPHARYNX:no exudate, no erythema and lips, buccal mucosa, and tongue normal  NECK: supple, thyroid normal size, non-tender, without nodularity LYMPH:  no palpable lymphadenopathy in the cervical, axillary or inguinal LUNGS: clear to auscultation and percussion with normal breathing effort HEART: regular rate & rhythm and no murmurs and no lower extremity edema ABDOMEN:abdomen soft, non-tender and normal bowel sounds Musculoskeletal:no cyanosis of digits and no clubbing  NEURO: alert & oriented x 3 with fluent speech, no focal motor/sensory deficits  LABORATORY DATA:  I have reviewed the data as listed    Component Value Date/Time   NA 141 04/29/2013 0532   K 4.4 04/29/2013 0532   CL 106 04/29/2013 0532   CO2 27 04/29/2013 0532   GLUCOSE 126* 04/29/2013 0532   BUN 22 04/29/2013 0532   CREATININE 0.68 04/29/2013 0532   CALCIUM 9.7 04/29/2013 0532   PROT 7.1 08/25/2012 1747   ALBUMIN 4.3 08/25/2012 1747   AST 17 08/25/2012 1747   ALT 17 08/25/2012 1747   ALKPHOS 66 08/25/2012 1747   BILITOT 0.8 08/25/2012 1747   GFRNONAA 77* 04/29/2013 0532   GFRAA 89* 04/29/2013 0532    No results found for this basename: SPEP,  UPEP,   kappa and lambda light chains    Lab Results  Component Value Date   WBC 3.1* 08/02/2013   NEUTROABS 1.9 08/02/2013   HGB 9.9* 08/02/2013   HCT 29.8* 08/02/2013   MCV 85.9 08/02/2013   PLT 237 08/02/2013      Chemistry      Component Value Date/Time   NA 141 04/29/2013 0532   K 4.4 04/29/2013 0532   CL 106 04/29/2013 0532   CO2 27 04/29/2013 0532   BUN 22 04/29/2013 0532   CREATININE 0.68 04/29/2013 0532      Component Value Date/Time   CALCIUM 9.7 04/29/2013 0532   ALKPHOS 66 08/25/2012 1747   AST 17 08/25/2012 1747   ALT 17 08/25/2012 1747   BILITOT 0.8 08/25/2012 1747        ASSESSMENT:  Myelodysplastic syndrome with severe anemia  PLAN:  #1 myelodysplastic syndrome Have a long discussion with the patient and her daughter. Unfortunately, the patient cannot tolerate Revlimid. Her erythropoietin level is very high and is not responding to erythropoietin stimulating agents. I recommend we give her a trial of Vidaza.  We discussed some of the risks, benefits and side-effects of Vidaza. Some of the short term side-effects included, though not limited to, risk of fatigue, risk of allergic reactions, pancytopenia, life-threatening infections, need for transfusions of blood products, admission to hospital for various reasons, and risks of death. The patient is aware that the response rates discussed earlier is not guaranteed.  After a long discussion, patient made an informed decision to proceed with the prescribed plan of  care and went ahead to sign the consent form today.   Given her age, I will recommend 5 days injection every 4 weeks and we will try to stop next week. I dispensed patient education information today.  #2 anemia This is likely anemia of  Underlying disease. The patient denies recent history of bleeding such as epistaxis, hematuria or hematochezia. She is asymptomatic from the anemia. We will observe for now.  She does not require transfusion now.   #3 skin rash from Revlimid This is improving. I recommend she continue on 5 mg prednisone every day until I see her back.   No orders of the defined types were placed in this encounter.   All questions were answered. The patient knows to call the clinic with any problems, questions or concerns. No barriers to learning was detected.    [facility], Otie Headlee, MD 08/02/2013 12:53 PM

## 2013-08-02 NOTE — Telephone Encounter (Signed)
appts made and printed. Pt is aware that tx's will be added. i emailed MW to add the tx's..pt is aware that i will call her with her tx time...td

## 2013-08-03 ENCOUNTER — Telehealth: Payer: Self-pay | Admitting: *Deleted

## 2013-08-03 NOTE — Telephone Encounter (Signed)
Daughter called to clarify pt's chemo schedule for next week.  Informed her of times for Vidaza.  Also informed we need to get pt scheduled for chemo class and will call her back w/ that date/time.  She verbalized understanding.

## 2013-08-03 NOTE — Telephone Encounter (Signed)
sw pt gv appts for chemo edu for 08/07/13 @ 10 and tx to follow @ 11:45am. i  Also made her aware that labs on 08/10/13 change to 10:30 am. Pt is aware...td

## 2013-08-03 NOTE — Telephone Encounter (Signed)
Per staff message and POF I have scheduled appts.  JMW  

## 2013-08-07 ENCOUNTER — Encounter (INDEPENDENT_AMBULATORY_CARE_PROVIDER_SITE_OTHER): Payer: Self-pay

## 2013-08-07 ENCOUNTER — Other Ambulatory Visit: Payer: Self-pay | Admitting: Hematology and Oncology

## 2013-08-07 ENCOUNTER — Other Ambulatory Visit: Payer: Medicare Other

## 2013-08-07 ENCOUNTER — Ambulatory Visit (HOSPITAL_BASED_OUTPATIENT_CLINIC_OR_DEPARTMENT_OTHER): Payer: Medicare Other

## 2013-08-07 VITALS — BP 87/47 | HR 68 | Temp 97.6°F | Resp 20

## 2013-08-07 DIAGNOSIS — Z5111 Encounter for antineoplastic chemotherapy: Secondary | ICD-10-CM

## 2013-08-07 DIAGNOSIS — D46C Myelodysplastic syndrome with isolated del(5q) chromosomal abnormality: Secondary | ICD-10-CM

## 2013-08-07 MED ORDER — ONDANSETRON HCL 8 MG PO TABS
ORAL_TABLET | ORAL | Status: AC
  Filled 2013-08-07: qty 1

## 2013-08-07 MED ORDER — ONDANSETRON HCL 8 MG PO TABS
8.0000 mg | ORAL_TABLET | Freq: Once | ORAL | Status: AC
  Administered 2013-08-07: 8 mg via ORAL

## 2013-08-07 MED ORDER — AZACITIDINE CHEMO SQ INJECTION
120.0000 mg | Freq: Once | INTRAMUSCULAR | Status: AC
  Administered 2013-08-07: 120 mg via SUBCUTANEOUS
  Filled 2013-08-07: qty 4.8

## 2013-08-07 NOTE — Patient Instructions (Signed)
[facility] Discharge Instructions for Patients Receiving Chemotherapy  Today you received the following chemotherapy agents Vidaza   BELOW ARE SYMPTOMS THAT SHOULD BE REPORTED IMMEDIATELY:  *FEVER GREATER THAN 100.5 F  *CHILLS WITH OR WITHOUT FEVER  NAUSEA AND VOMITING THAT IS NOT CONTROLLED WITH YOUR NAUSEA MEDICATION  *UNUSUAL SHORTNESS OF BREATH  *UNUSUAL BRUISING OR BLEEDING  TENDERNESS IN MOUTH AND THROAT WITH OR WITHOUT PRESENCE OF ULCERS  *URINARY PROBLEMS  *BOWEL PROBLEMS  UNUSUAL RASH Items with * indicate a potential emergency and should be followed up as soon as possible.  Feel free to call the clinic you have any questions or concerns. The clinic phone number is [phone].

## 2013-08-08 ENCOUNTER — Encounter (INDEPENDENT_AMBULATORY_CARE_PROVIDER_SITE_OTHER): Payer: Self-pay

## 2013-08-08 ENCOUNTER — Ambulatory Visit (HOSPITAL_BASED_OUTPATIENT_CLINIC_OR_DEPARTMENT_OTHER): Payer: Medicare Other

## 2013-08-08 VITALS — BP 139/30 | HR 65 | Temp 97.6°F | Resp 20

## 2013-08-08 DIAGNOSIS — D46C Myelodysplastic syndrome with isolated del(5q) chromosomal abnormality: Secondary | ICD-10-CM

## 2013-08-08 DIAGNOSIS — Z5111 Encounter for antineoplastic chemotherapy: Secondary | ICD-10-CM

## 2013-08-08 MED ORDER — AZACITIDINE CHEMO SQ INJECTION
120.0000 mg | Freq: Once | INTRAMUSCULAR | Status: AC
  Administered 2013-08-08: 120 mg via SUBCUTANEOUS
  Filled 2013-08-08: qty 4.8

## 2013-08-08 MED ORDER — ONDANSETRON HCL 8 MG PO TABS
ORAL_TABLET | ORAL | Status: AC
  Filled 2013-08-08: qty 1

## 2013-08-08 MED ORDER — ONDANSETRON HCL 8 MG PO TABS
8.0000 mg | ORAL_TABLET | Freq: Once | ORAL | Status: AC
  Administered 2013-08-08: 8 mg via ORAL

## 2013-08-08 NOTE — Patient Instructions (Signed)
[facility] Discharge Instructions for Patients Receiving Chemotherapy  Today you received the following chemotherapy agents Vidaza. To help prevent nausea and vomiting after your treatment, we encourage you to take your nausea medication as prescribed.    If you develop nausea and vomiting that is not controlled by your nausea medication, call the clinic.   BELOW ARE SYMPTOMS THAT SHOULD BE REPORTED IMMEDIATELY:  *FEVER GREATER THAN 100.5 F  *CHILLS WITH OR WITHOUT FEVER  NAUSEA AND VOMITING THAT IS NOT CONTROLLED WITH YOUR NAUSEA MEDICATION  *UNUSUAL SHORTNESS OF BREATH  *UNUSUAL BRUISING OR BLEEDING  TENDERNESS IN MOUTH AND THROAT WITH OR WITHOUT PRESENCE OF ULCERS  *URINARY PROBLEMS  *BOWEL PROBLEMS  UNUSUAL RASH Items with * indicate a potential emergency and should be followed up as soon as possible.  Feel free to call the clinic you have any questions or concerns. The clinic phone number is [phone].    

## 2013-08-09 ENCOUNTER — Encounter (INDEPENDENT_AMBULATORY_CARE_PROVIDER_SITE_OTHER): Payer: Self-pay

## 2013-08-09 ENCOUNTER — Ambulatory Visit (HOSPITAL_BASED_OUTPATIENT_CLINIC_OR_DEPARTMENT_OTHER): Payer: Medicare Other

## 2013-08-09 VITALS — BP 131/54 | HR 68 | Temp 97.0°F | Resp 18

## 2013-08-09 DIAGNOSIS — Z5111 Encounter for antineoplastic chemotherapy: Secondary | ICD-10-CM

## 2013-08-09 DIAGNOSIS — D46C Myelodysplastic syndrome with isolated del(5q) chromosomal abnormality: Secondary | ICD-10-CM

## 2013-08-09 MED ORDER — AZACITIDINE CHEMO SQ INJECTION
76.0000 mg/m2 | Freq: Once | INTRAMUSCULAR | Status: AC
  Administered 2013-08-09: 120 mg via SUBCUTANEOUS
  Filled 2013-08-09: qty 4.8

## 2013-08-09 MED ORDER — ONDANSETRON HCL 8 MG PO TABS
ORAL_TABLET | ORAL | Status: AC
  Filled 2013-08-09: qty 1

## 2013-08-09 MED ORDER — ONDANSETRON HCL 8 MG PO TABS
8.0000 mg | ORAL_TABLET | Freq: Once | ORAL | Status: AC
  Administered 2013-08-09: 8 mg via ORAL

## 2013-08-09 NOTE — Patient Instructions (Signed)
[facility] Discharge Instructions for Patients Receiving Chemotherapy  Today you received the following chemotherapy agent: Vidaza   To help prevent nausea and vomiting after your treatment, we encourage you to take your nausea medication as prescribed.    If you develop nausea and vomiting that is not controlled by your nausea medication, call the clinic.   BELOW ARE SYMPTOMS THAT SHOULD BE REPORTED IMMEDIATELY:  *FEVER GREATER THAN 100.5 F  *CHILLS WITH OR WITHOUT FEVER  NAUSEA AND VOMITING THAT IS NOT CONTROLLED WITH YOUR NAUSEA MEDICATION  *UNUSUAL SHORTNESS OF BREATH  *UNUSUAL BRUISING OR BLEEDING  TENDERNESS IN MOUTH AND THROAT WITH OR WITHOUT PRESENCE OF ULCERS  *URINARY PROBLEMS  *BOWEL PROBLEMS  UNUSUAL RASH Items with * indicate a potential emergency and should be followed up as soon as possible.  Feel free to call the clinic you have any questions or concerns. The clinic phone number is [phone].    

## 2013-08-10 ENCOUNTER — Encounter (INDEPENDENT_AMBULATORY_CARE_PROVIDER_SITE_OTHER): Payer: Self-pay

## 2013-08-10 ENCOUNTER — Other Ambulatory Visit: Payer: Medicare Other | Admitting: Lab

## 2013-08-10 ENCOUNTER — Other Ambulatory Visit (HOSPITAL_BASED_OUTPATIENT_CLINIC_OR_DEPARTMENT_OTHER): Payer: Medicare Other | Admitting: Lab

## 2013-08-10 ENCOUNTER — Ambulatory Visit (HOSPITAL_BASED_OUTPATIENT_CLINIC_OR_DEPARTMENT_OTHER): Payer: Medicare Other

## 2013-08-10 VITALS — BP 125/42 | HR 67 | Temp 97.8°F | Resp 18

## 2013-08-10 DIAGNOSIS — D46C Myelodysplastic syndrome with isolated del(5q) chromosomal abnormality: Secondary | ICD-10-CM

## 2013-08-10 DIAGNOSIS — Z5111 Encounter for antineoplastic chemotherapy: Secondary | ICD-10-CM

## 2013-08-10 LAB — CBC WITH DIFFERENTIAL/PLATELET
Basophils Absolute: 0.2 10*3/uL — ABNORMAL HIGH (ref 0.0–0.1)
Eosinophils Absolute: 0.7 10*3/uL — ABNORMAL HIGH (ref 0.0–0.5)
HGB: 8.1 g/dL — ABNORMAL LOW (ref 11.6–15.9)
LYMPH%: 17.6 % (ref 14.0–49.7)
MCV: 86.7 fL (ref 79.5–101.0)
MONO#: 0.1 10*3/uL (ref 0.1–0.9)
NEUT#: 2.3 10*3/uL (ref 1.5–6.5)
Platelets: 357 10*3/uL (ref 145–400)
RBC: 2.86 10*6/uL — ABNORMAL LOW (ref 3.70–5.45)
RDW: 17.8 % — ABNORMAL HIGH (ref 11.2–14.5)
WBC: 3.9 10*3/uL (ref 3.9–10.3)

## 2013-08-10 LAB — TECHNOLOGIST REVIEW

## 2013-08-10 LAB — HOLD TUBE, BLOOD BANK

## 2013-08-10 MED ORDER — ONDANSETRON HCL 8 MG PO TABS
8.0000 mg | ORAL_TABLET | Freq: Once | ORAL | Status: AC
  Administered 2013-08-10: 8 mg via ORAL

## 2013-08-10 MED ORDER — ONDANSETRON HCL 8 MG PO TABS
ORAL_TABLET | ORAL | Status: AC
  Filled 2013-08-10: qty 1

## 2013-08-10 MED ORDER — AZACITIDINE CHEMO SQ INJECTION
120.0000 mg | Freq: Once | INTRAMUSCULAR | Status: AC
  Administered 2013-08-10: 120 mg via SUBCUTANEOUS
  Filled 2013-08-10: qty 4.8

## 2013-08-10 NOTE — Patient Instructions (Signed)
[facility] Discharge Instructions for Patients Receiving Chemotherapy  Today you received the following chemotherapy agent: Vidaza   To help prevent nausea and vomiting after your treatment, we encourage you to take your nausea medication as prescribed.    If you develop nausea and vomiting that is not controlled by your nausea medication, call the clinic.   BELOW ARE SYMPTOMS THAT SHOULD BE REPORTED IMMEDIATELY:  *FEVER GREATER THAN 100.5 F  *CHILLS WITH OR WITHOUT FEVER  NAUSEA AND VOMITING THAT IS NOT CONTROLLED WITH YOUR NAUSEA MEDICATION  *UNUSUAL SHORTNESS OF BREATH  *UNUSUAL BRUISING OR BLEEDING  TENDERNESS IN MOUTH AND THROAT WITH OR WITHOUT PRESENCE OF ULCERS  *URINARY PROBLEMS  *BOWEL PROBLEMS  UNUSUAL RASH Items with * indicate a potential emergency and should be followed up as soon as possible.  Feel free to call the clinic you have any questions or concerns. The clinic phone number is [phone].    

## 2013-08-11 ENCOUNTER — Ambulatory Visit (HOSPITAL_BASED_OUTPATIENT_CLINIC_OR_DEPARTMENT_OTHER): Payer: Medicare Other

## 2013-08-11 ENCOUNTER — Encounter (INDEPENDENT_AMBULATORY_CARE_PROVIDER_SITE_OTHER): Payer: Self-pay

## 2013-08-11 VITALS — BP 149/46 | HR 66 | Temp 97.8°F | Resp 18

## 2013-08-11 DIAGNOSIS — D46C Myelodysplastic syndrome with isolated del(5q) chromosomal abnormality: Secondary | ICD-10-CM

## 2013-08-11 DIAGNOSIS — Z5111 Encounter for antineoplastic chemotherapy: Secondary | ICD-10-CM

## 2013-08-11 MED ORDER — ONDANSETRON HCL 8 MG PO TABS
8.0000 mg | ORAL_TABLET | Freq: Once | ORAL | Status: AC
  Administered 2013-08-11: 8 mg via ORAL

## 2013-08-11 MED ORDER — AZACITIDINE CHEMO SQ INJECTION
75.0000 mg/m2 | Freq: Once | INTRAMUSCULAR | Status: AC
  Administered 2013-08-11: 117.5 mg via SUBCUTANEOUS
  Filled 2013-08-11: qty 4.7

## 2013-08-11 MED ORDER — ONDANSETRON HCL 8 MG PO TABS
ORAL_TABLET | ORAL | Status: AC
  Filled 2013-08-11: qty 1

## 2013-08-11 NOTE — Patient Instructions (Signed)
[facility] Discharge Instructions for Patients Receiving Chemotherapy  Today you received the following chemotherapy agents: Vidaza  To help prevent nausea and vomiting after your treatment, we encourage you to take your nausea medication.  Take it as often as prescribed.     If you develop nausea and vomiting that is not controlled by your nausea medication, call the clinic. If it is after clinic hours your family physician or the after hours number for the clinic or go to the Emergency Department.   BELOW ARE SYMPTOMS THAT SHOULD BE REPORTED IMMEDIATELY:  *FEVER GREATER THAN 100.5 F  *CHILLS WITH OR WITHOUT FEVER  NAUSEA AND VOMITING THAT IS NOT CONTROLLED WITH YOUR NAUSEA MEDICATION  *UNUSUAL SHORTNESS OF BREATH  *UNUSUAL BRUISING OR BLEEDING  TENDERNESS IN MOUTH AND THROAT WITH OR WITHOUT PRESENCE OF ULCERS  *URINARY PROBLEMS  *BOWEL PROBLEMS  UNUSUAL RASH Items with * indicate a potential emergency and should be followed up as soon as possible.  Feel free to call the clinic you have any questions or concerns. The clinic phone number is [phone].   I have been informed and understand all the instructions given to me. I know to contact the clinic, my physician, or go to the Emergency Department if any problems should occur. I do not have any questions at this time, but understand that I may call the clinic during office hours   should I have any questions or need assistance in obtaining follow up care.    __________________________________________  _____________  __________ Signature of Patient or Authorized Representative            Date                   Time    __________________________________________ Nurse's Signature

## 2013-08-16 ENCOUNTER — Telehealth: Payer: Self-pay | Admitting: *Deleted

## 2013-08-16 ENCOUNTER — Other Ambulatory Visit: Payer: Self-pay | Admitting: Hematology and Oncology

## 2013-08-16 ENCOUNTER — Other Ambulatory Visit (HOSPITAL_BASED_OUTPATIENT_CLINIC_OR_DEPARTMENT_OTHER): Payer: Medicare Other | Admitting: Lab

## 2013-08-16 ENCOUNTER — Ambulatory Visit (HOSPITAL_COMMUNITY)
Admission: RE | Admit: 2013-08-16 | Discharge: 2013-08-16 | Disposition: A | Discharge status: Home / Self Care | Payer: Medicare Other | Source: Ambulatory Visit | Attending: Hematology and Oncology | Admitting: Hematology and Oncology

## 2013-08-16 DIAGNOSIS — D649 Anemia, unspecified: Secondary | ICD-10-CM | POA: Insufficient documentation

## 2013-08-16 DIAGNOSIS — D46C Myelodysplastic syndrome with isolated del(5q) chromosomal abnormality: Secondary | ICD-10-CM

## 2013-08-16 LAB — CBC WITH DIFFERENTIAL/PLATELET
BASO%: 4.4 % — ABNORMAL HIGH (ref 0.0–2.0)
Basophils Absolute: 0.2 10*3/uL — ABNORMAL HIGH (ref 0.0–0.1)
Eosinophils Absolute: 0.9 10*3/uL — ABNORMAL HIGH (ref 0.0–0.5)
HCT: 22.7 % — ABNORMAL LOW (ref 34.8–46.6)
HGB: 7.5 g/dL — ABNORMAL LOW (ref 11.6–15.9)
MCV: 85.6 fL (ref 79.5–101.0)
NEUT#: 2.3 10*3/uL (ref 1.5–6.5)
Platelets: 309 10*3/uL (ref 145–400)
RDW: 17.6 % — ABNORMAL HIGH (ref 11.2–14.5)
lymph#: 1 10*3/uL (ref 0.9–3.3)

## 2013-08-16 NOTE — Telephone Encounter (Signed)
Per staff message and POF I have scheduled appts.  JMW  

## 2013-08-18 ENCOUNTER — Ambulatory Visit (HOSPITAL_BASED_OUTPATIENT_CLINIC_OR_DEPARTMENT_OTHER): Payer: Medicare Other

## 2013-08-18 ENCOUNTER — Other Ambulatory Visit (HOSPITAL_BASED_OUTPATIENT_CLINIC_OR_DEPARTMENT_OTHER): Payer: Medicare Other | Admitting: Lab

## 2013-08-18 VITALS — BP 138/58 | HR 66 | Temp 98.2°F | Resp 18

## 2013-08-18 DIAGNOSIS — D46C Myelodysplastic syndrome with isolated del(5q) chromosomal abnormality: Secondary | ICD-10-CM

## 2013-08-18 LAB — CBC WITH DIFFERENTIAL/PLATELET
BASO%: 5.3 % — ABNORMAL HIGH (ref 0.0–2.0)
HCT: 22.9 % — ABNORMAL LOW (ref 34.8–46.6)
LYMPH%: 20.6 % (ref 14.0–49.7)
MCH: 28.1 pg (ref 25.1–34.0)
MCHC: 32.8 g/dL (ref 31.5–36.0)
MCV: 85.8 fL (ref 79.5–101.0)
MONO#: 0.1 10*3/uL (ref 0.1–0.9)
MONO%: 1.8 % (ref 0.0–14.0)
NEUT#: 2.4 10*3/uL (ref 1.5–6.5)
NEUT%: 48.8 % (ref 38.4–76.8)
Platelets: 278 10*3/uL (ref 145–400)
RBC: 2.67 10*6/uL — ABNORMAL LOW (ref 3.70–5.45)
WBC: 4.9 10*3/uL (ref 3.9–10.3)
nRBC: 0 % (ref 0–0)

## 2013-08-18 LAB — PREPARE RBC (CROSSMATCH)

## 2013-08-18 MED ORDER — DIPHENHYDRAMINE HCL 25 MG PO CAPS
25.0000 mg | ORAL_CAPSULE | Freq: Four times a day (QID) | ORAL | Status: AC | PRN
  Administered 2013-08-18: 25 mg via ORAL

## 2013-08-18 MED ORDER — SODIUM CHLORIDE 0.9 % IV SOLN
250.0000 mL | Freq: Once | INTRAVENOUS | Status: AC
  Administered 2013-08-18: 250 mL via INTRAVENOUS

## 2013-08-18 MED ORDER — ACETAMINOPHEN 325 MG PO TABS
650.0000 mg | ORAL_TABLET | Freq: Four times a day (QID) | ORAL | Status: AC | PRN
  Administered 2013-08-18: 650 mg via ORAL

## 2013-08-18 MED ORDER — DIPHENHYDRAMINE HCL 25 MG PO CAPS
ORAL_CAPSULE | ORAL | Status: AC
  Filled 2013-08-18: qty 1

## 2013-08-18 MED ORDER — ACETAMINOPHEN 325 MG PO TABS
ORAL_TABLET | ORAL | Status: AC
  Filled 2013-08-18: qty 2

## 2013-08-18 NOTE — Patient Instructions (Signed)
Blood Transfusion Information WHAT IS A BLOOD TRANSFUSION? A transfusion is the replacement of blood or some of its parts. Blood is made up of multiple cells which provide different functions.  Red blood cells carry oxygen and are used for blood loss replacement.  White blood cells fight against infection.  Platelets control bleeding.  Plasma helps clot blood.  Other blood products are available for specialized needs, such as hemophilia or other clotting disorders. BEFORE THE TRANSFUSION  Who gives blood for transfusions?   You may be able to donate blood to be used at a later date on yourself (autologous donation).  Relatives can be asked to donate blood. This is generally not any safer than if you have received blood from a stranger. The same precautions are taken to ensure safety when a relative's blood is donated.  Healthy volunteers who are fully evaluated to make sure their blood is safe. This is blood bank blood. Transfusion therapy is the safest it has ever been in the practice of medicine. Before blood is taken from a donor, a complete history is taken to make sure that person has no history of diseases nor engages in risky social behavior (examples are intravenous drug use or sexual activity with multiple partners). The donor's travel history is screened to minimize risk of transmitting infections, such as malaria. The donated blood is tested for signs of infectious diseases, such as HIV and hepatitis. The blood is then tested to be sure it is compatible with you in order to minimize the chance of a transfusion reaction. If you or a relative donates blood, this is often done in anticipation of surgery and is not appropriate for emergency situations. It takes many days to process the donated blood. RISKS AND COMPLICATIONS Although transfusion therapy is very safe and saves many lives, the main dangers of transfusion include:   Getting an infectious disease.  Developing a  transfusion reaction. This is an allergic reaction to something in the blood you were given. Every precaution is taken to prevent this. The decision to have a blood transfusion has been considered carefully by your caregiver before blood is given. Blood is not given unless the benefits outweigh the risks. AFTER THE TRANSFUSION  Right after receiving a blood transfusion, you will usually feel much better and more energetic. This is especially true if your red blood cells have gotten low (anemic). The transfusion raises the level of the red blood cells which carry oxygen, and this usually causes an energy increase.  The nurse administering the transfusion will monitor you carefully for complications. HOME CARE INSTRUCTIONS  No special instructions are needed after a transfusion. You may find your energy is better. Speak with your caregiver about any limitations on activity for underlying diseases you may have. SEEK MEDICAL CARE IF:   Your condition is not improving after your transfusion.  You develop redness or irritation at the intravenous (IV) site. SEEK IMMEDIATE MEDICAL CARE IF:  Any of the following symptoms occur over the next 12 hours:  Shaking chills.  You have a temperature by mouth above 102 F (38.9 C), not controlled by medicine.  Chest, back, or muscle pain.  People around you feel you are not acting correctly or are confused.  Shortness of breath or difficulty breathing.  Dizziness and fainting.  You get a rash or develop hives.  You have a decrease in urine output.  Your urine turns a dark color or changes to pink, red, or brown. Any of the following   symptoms occur over the next 10 days:  You have a temperature by mouth above 102 F (38.9 C), not controlled by medicine.  Shortness of breath.  Weakness after normal activity.  The white part of the eye turns yellow (jaundice).  You have a decrease in the amount of urine or are urinating less often.  Your  urine turns a dark color or changes to pink, red, or brown. Document Released: 09/18/2000 Document Revised: 12/14/2011 Document Reviewed: 05/07/2008 [employer] Patient Information 2014 [employer].  

## 2013-08-19 LAB — TYPE AND SCREEN
ABO/RH(D): A POS
Antibody Screen: NEGATIVE

## 2013-08-23 ENCOUNTER — Other Ambulatory Visit: Payer: Medicare Other | Admitting: Lab

## 2013-08-24 ENCOUNTER — Telehealth: Payer: Self-pay | Admitting: Hematology and Oncology

## 2013-08-24 ENCOUNTER — Ambulatory Visit (HOSPITAL_BASED_OUTPATIENT_CLINIC_OR_DEPARTMENT_OTHER): Payer: Medicare Other

## 2013-08-24 ENCOUNTER — Ambulatory Visit (HOSPITAL_BASED_OUTPATIENT_CLINIC_OR_DEPARTMENT_OTHER): Payer: Medicare Other | Admitting: Hematology and Oncology

## 2013-08-24 ENCOUNTER — Encounter (INDEPENDENT_AMBULATORY_CARE_PROVIDER_SITE_OTHER): Payer: Self-pay

## 2013-08-24 ENCOUNTER — Other Ambulatory Visit: Payer: Self-pay | Admitting: Hematology and Oncology

## 2013-08-24 ENCOUNTER — Other Ambulatory Visit (HOSPITAL_BASED_OUTPATIENT_CLINIC_OR_DEPARTMENT_OTHER): Payer: Medicare Other | Admitting: Lab

## 2013-08-24 VITALS — BP 156/43 | HR 72 | Temp 98.2°F | Resp 20 | Ht 60.0 in | Wt 130.0 lb

## 2013-08-24 VITALS — BP 152/64 | HR 68 | Temp 97.0°F | Resp 19 | Ht 60.0 in

## 2013-08-24 DIAGNOSIS — D46C Myelodysplastic syndrome with isolated del(5q) chromosomal abnormality: Secondary | ICD-10-CM

## 2013-08-24 DIAGNOSIS — D649 Anemia, unspecified: Secondary | ICD-10-CM

## 2013-08-24 DIAGNOSIS — R21 Rash and other nonspecific skin eruption: Secondary | ICD-10-CM

## 2013-08-24 LAB — CBC WITH DIFFERENTIAL/PLATELET
Basophils Absolute: 0.1 10*3/uL (ref 0.0–0.1)
EOS%: 16.5 % — ABNORMAL HIGH (ref 0.0–7.0)
Eosinophils Absolute: 0.6 10*3/uL — ABNORMAL HIGH (ref 0.0–0.5)
HGB: 7.5 g/dL — ABNORMAL LOW (ref 11.6–15.9)
LYMPH%: 18.7 % (ref 14.0–49.7)
MCV: 85 fL (ref 79.5–101.0)
MONO#: 0 10*3/uL — ABNORMAL LOW (ref 0.1–0.9)
NEUT#: 2.3 10*3/uL (ref 1.5–6.5)
NEUT%: 62.9 % (ref 38.4–76.8)
Platelets: 316 10*3/uL (ref 145–400)
RBC: 2.67 10*6/uL — ABNORMAL LOW (ref 3.70–5.45)
WBC: 3.6 10*3/uL — ABNORMAL LOW (ref 3.9–10.3)
lymph#: 0.7 10*3/uL — ABNORMAL LOW (ref 0.9–3.3)

## 2013-08-24 LAB — HOLD TUBE, BLOOD BANK

## 2013-08-24 MED ORDER — SODIUM CHLORIDE 0.9 % IV SOLN
250.0000 mL | Freq: Once | INTRAVENOUS | Status: AC
  Administered 2013-08-24: 250 mL via INTRAVENOUS

## 2013-08-24 NOTE — Patient Instructions (Signed)
Blood Transfusion Information WHAT IS A BLOOD TRANSFUSION? A transfusion is the replacement of blood or some of its parts. Blood is made up of multiple cells which provide different functions.  Red blood cells carry oxygen and are used for blood loss replacement.  White blood cells fight against infection.  Platelets control bleeding.  Plasma helps clot blood.  Other blood products are available for specialized needs, such as hemophilia or other clotting disorders. BEFORE THE TRANSFUSION  Who gives blood for transfusions?   You may be able to donate blood to be used at a later date on yourself (autologous donation).  Relatives can be asked to donate blood. This is generally not any safer than if you have received blood from a stranger. The same precautions are taken to ensure safety when a relative's blood is donated.  Healthy volunteers who are fully evaluated to make sure their blood is safe. This is blood bank blood. Transfusion therapy is the safest it has ever been in the practice of medicine. Before blood is taken from a donor, a complete history is taken to make sure that person has no history of diseases nor engages in risky social behavior (examples are intravenous drug use or sexual activity with multiple partners). The donor's travel history is screened to minimize risk of transmitting infections, such as malaria. The donated blood is tested for signs of infectious diseases, such as HIV and hepatitis. The blood is then tested to be sure it is compatible with you in order to minimize the chance of a transfusion reaction. If you or a relative donates blood, this is often done in anticipation of surgery and is not appropriate for emergency situations. It takes many days to process the donated blood. RISKS AND COMPLICATIONS Although transfusion therapy is very safe and saves many lives, the main dangers of transfusion include:   Getting an infectious disease.  Developing a  transfusion reaction. This is an allergic reaction to something in the blood you were given. Every precaution is taken to prevent this. The decision to have a blood transfusion has been considered carefully by your caregiver before blood is given. Blood is not given unless the benefits outweigh the risks. AFTER THE TRANSFUSION  Right after receiving a blood transfusion, you will usually feel much better and more energetic. This is especially true if your red blood cells have gotten low (anemic). The transfusion raises the level of the red blood cells which carry oxygen, and this usually causes an energy increase.  The nurse administering the transfusion will monitor you carefully for complications. HOME CARE INSTRUCTIONS  No special instructions are needed after a transfusion. You may find your energy is better. Speak with your caregiver about any limitations on activity for underlying diseases you may have. SEEK MEDICAL CARE IF:   Your condition is not improving after your transfusion.  You develop redness or irritation at the intravenous (IV) site. SEEK IMMEDIATE MEDICAL CARE IF:  Any of the following symptoms occur over the next 12 hours:  Shaking chills.  You have a temperature by mouth above 102 F (38.9 C), not controlled by medicine.  Chest, back, or muscle pain.  People around you feel you are not acting correctly or are confused.  Shortness of breath or difficulty breathing.  Dizziness and fainting.  You get a rash or develop hives.  You have a decrease in urine output.  Your urine turns a dark color or changes to pink, red, or brown. Any of the following   symptoms occur over the next 10 days:  You have a temperature by mouth above 102 F (38.9 C), not controlled by medicine.  Shortness of breath.  Weakness after normal activity.  The white part of the eye turns yellow (jaundice).  You have a decrease in the amount of urine or are urinating less often.  Your  urine turns a dark color or changes to pink, red, or brown. Document Released: 09/18/2000 Document Revised: 12/14/2011 Document Reviewed: 05/07/2008 [employer] Patient Information 2014 [employer].  

## 2013-08-24 NOTE — Progress Notes (Signed)
[facility] OFFICE PROGRESS NOTE  Patient Care Team: Lupita Raider, MD as PCP - General (Family Medicine)  DIAGNOSIS: Myelodysplastic syndrome with severe anemia  SUMMARY OF ONCOLOGIC HISTORY: This is a very pleasant 79 year old lady who become transfusion dependent over the last 3 months. She has received 6 units of blood so far, the first in July, the second one in August and most recently today. She denies any bleeding such as epistaxis, hematuria, or hematochezia. 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 Erythropoietin stimulating agents were stopped. In October 2014, she was started on Revlimid however develop severe allergic reaction to Revlimid. That was discontinued. On 08/07/2013, we started her on 5 days subcutaneous injection of Vidaza every 28 days INTERVAL HISTORY: Rebekah Cross 79 y.o. female returns for further followup She has very mild reaction to Vidaza with mild skin peeling but no allergic reaction. She remained on 5 mg of prednisone a day. Her previous allergic reaction to Revlimid has resolved. She denies any recent fever, chills, night sweats or abnormal weight loss The patient denies any recent signs or symptoms of bleeding such as spontaneous epistaxis, hematuria or hematochezia. She received one unit of blood transfusion last week with improvement of her energy level.  I have reviewed the past medical history, past surgical history, social history and family history with the patient and they are unchanged from previous note.  ALLERGIES:  is allergic to novocain; oxycodone; codeine; methimazole; and revlimid.  MEDICATIONS:  Current Outpatient Prescriptions  Medication Sig Dispense Refill  . aspirin 81 MG tablet Take 81 mg by mouth daily.        . cholecalciferol (VITAMIN D) 400 UNITS TABS Take 400 Units by mouth daily.      . hydrOXYzine  (ATARAX/VISTARIL) 25 MG tablet Take 1 tablet (25 mg total) by mouth every 4 (four) hours as needed for itching.  60 tablet  3  . LORazepam (ATIVAN) 0.5 MG tablet Take 0.5-1 mg by mouth at bedtime.       . metoprolol succinate (TOPROL-XL) 100 MG 24 hr tablet Take 100 mg by mouth every morning.       . mometasone (ELOCON) 0.1 % cream Apply 1 application topically daily as needed. For psoriasis      . Multiple Vitamin (MULTIVITAMIN) tablet Take 1 tablet by mouth daily.        . nisoldipine (SULAR) 25.5 MG 24 hr tablet Take 25.5 mg by mouth daily.        Marland Kitchen olmesartan-hydrochlorothiazide (BENICAR HCT) 40-25 MG per tablet Take 1 tablet by mouth every morning.       . ondansetron (ZOFRAN) 8 MG tablet       . potassium chloride SA (K-DUR,KLOR-CON) 20 MEQ tablet Take 20 mEq by mouth daily.      . pravastatin (PRAVACHOL) 40 MG tablet Take 40 mg by mouth at bedtime.       . predniSONE (DELTASONE) 5 MG tablet Take 1 tablet (5 mg total) by mouth daily.  90 tablet  0  . prochlorperazine (COMPAZINE) 10 MG tablet       . triamcinolone cream (KENALOG) 0.1 % Apply 1 application topically daily.      Marland Kitchen acitretin (SORIATANE) 25 MG capsule Take 25 mg by mouth every Monday, Wednesday, and Friday. Off on Nov. 5th for one week then resume.      . COMPRO 25 MG suppository  No current facility-administered medications for this visit.    REVIEW OF SYSTEMS:   Constitutional: Denies fevers, chills or abnormal weight loss Eyes: Denies blurriness of vision Ears, nose, mouth, throat, and face: Denies mucositis or sore throat Respiratory: Denies cough, dyspnea or wheezes Cardiovascular: Denies palpitation, chest discomfort or lower extremity swelling Gastrointestinal:  Denies nausea, heartburn or change in bowel habits Skin: Denies abnormal skin rashes Lymphatics: Denies new lymphadenopathy or easy bruising Neurological:Denies numbness, tingling or new weaknesses Behavioral/Psych: Mood is stable, no new changes   All other systems were reviewed with the patient and are negative.  PHYSICAL EXAMINATION: ECOG PERFORMANCE STATUS: 0 - Asymptomatic  Filed Vitals:   08/24/13 0906  BP: 156/43  Pulse: 72  Temp: 98.2 F (36.8 C)  Resp: 20   Filed Weights   08/24/13 0906  Weight: 130 lb (58.968 kg)    GENERAL:alert, no distress and comfortable. She looked pale SKIN: She had mild residual change in the skin consistent with recent rash EYES: normal, Conjunctiva are pale and non-injected, sclera clear OROPHARYNX:no exudate, no erythema and lips, buccal mucosa, and tongue normal  NECK: supple, thyroid normal size, non-tender, without nodularity LYMPH:  no palpable lymphadenopathy in the cervical, axillary or inguinal LUNGS: clear to auscultation and percussion with normal breathing effort HEART: regular rate & rhythm and no murmurs and no lower extremity edema ABDOMEN:abdomen soft, non-tender and normal bowel sounds Musculoskeletal:no cyanosis of digits and no clubbing  NEURO: alert & oriented x 3 with fluent speech, no focal motor/sensory deficits  LABORATORY DATA:  I have reviewed the data as listed    Component Value Date/Time   NA 141 04/29/2013 0532   K 4.4 04/29/2013 0532   CL 106 04/29/2013 0532   CO2 27 04/29/2013 0532   GLUCOSE 126* 04/29/2013 0532   BUN 22 04/29/2013 0532   CREATININE 0.68 04/29/2013 0532   CALCIUM 9.7 04/29/2013 0532   PROT 7.1 08/25/2012 1747   ALBUMIN 4.3 08/25/2012 1747   AST 17 08/25/2012 1747   ALT 17 08/25/2012 1747   ALKPHOS 66 08/25/2012 1747   BILITOT 0.8 08/25/2012 1747   GFRNONAA 77* 04/29/2013 0532   GFRAA 89* 04/29/2013 0532    No results found for this basename: SPEP,  UPEP,   kappa and lambda light chains    Lab Results  Component Value Date   WBC 3.6* 08/24/2013   NEUTROABS 2.3 08/24/2013   HGB 7.5* 08/24/2013   HCT 22.7* 08/24/2013   MCV 85.0 08/24/2013   PLT 316 08/24/2013      Chemistry      Component Value Date/Time   NA 141  04/29/2013 0532   K 4.4 04/29/2013 0532   CL 106 04/29/2013 0532   CO2 27 04/29/2013 0532   BUN 22 04/29/2013 0532   CREATININE 0.68 04/29/2013 0532      Component Value Date/Time   CALCIUM 9.7 04/29/2013 0532   ALKPHOS 66 08/25/2012 1747   AST 17 08/25/2012 1747   ALT 17 08/25/2012 1747   BILITOT 0.8 08/25/2012 1747     ASSESSMENT & PLAN:  #1 myelodysplastic syndrome We will continue cycle 2 next month without further dose adjustment. 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 The patient denies recent history of bleeding such as epistaxis, hematuria or hematochezia. She is symptomatic from the anemia.  I will proceed with one unit of blood transfusion today. We will establish transfusion threshold to give her one unit of blood each time if her hemoglobin dropped to less than 8 g. The patient will come back next week for another blood checked and see her back the following week #3 recent skin rash This has resolved. I do not think the reaction that she described with the 5 days constitute an allergic reaction. We will monitor closely.  Orders Placed This Encounter  Procedures  . Comprehensive metabolic panel    Standing Status: Future     Number of Occurrences:      Standing Expiration Date: 08/24/2014   All questions were answered. The patient knows to call the clinic with any problems, questions or concerns. No barriers to learning was detected.    Rebekah Quarry, MD 08/24/2013 9:35 AM

## 2013-08-24 NOTE — Telephone Encounter (Signed)
gv pt appt schedule for november and december. pt will had lb/NG 12/1 @ 10am and there is no inf appt that day until 1:45pm. pt shceduled in the 1:45pm time slot and made aware I check w/NG re possible changing the fl/u time. per pt if time can't be changed she will keep appts as scheduled.

## 2013-08-25 LAB — TYPE AND SCREEN: ABO/RH(D): A POS

## 2013-08-27 ENCOUNTER — Emergency Department (HOSPITAL_COMMUNITY): Payer: Medicare Other

## 2013-08-27 ENCOUNTER — Encounter (HOSPITAL_COMMUNITY): Payer: Self-pay | Admitting: Emergency Medicine

## 2013-08-27 ENCOUNTER — Emergency Department (HOSPITAL_COMMUNITY)
Admission: EM | Admit: 2013-08-27 | Discharge: 2013-08-27 | Disposition: A | Discharge status: Home / Self Care | Payer: Medicare Other | Attending: Emergency Medicine | Admitting: Emergency Medicine

## 2013-08-27 DIAGNOSIS — R509 Fever, unspecified: Secondary | ICD-10-CM

## 2013-08-27 DIAGNOSIS — I10 Essential (primary) hypertension: Secondary | ICD-10-CM | POA: Insufficient documentation

## 2013-08-27 DIAGNOSIS — Z79899 Other long term (current) drug therapy: Secondary | ICD-10-CM | POA: Insufficient documentation

## 2013-08-27 DIAGNOSIS — Z8739 Personal history of other diseases of the musculoskeletal system and connective tissue: Secondary | ICD-10-CM | POA: Insufficient documentation

## 2013-08-27 DIAGNOSIS — E7800 Pure hypercholesterolemia, unspecified: Secondary | ICD-10-CM | POA: Insufficient documentation

## 2013-08-27 DIAGNOSIS — Z7982 Long term (current) use of aspirin: Secondary | ICD-10-CM | POA: Insufficient documentation

## 2013-08-27 DIAGNOSIS — Z862 Personal history of diseases of the blood and blood-forming organs and certain disorders involving the immune mechanism: Secondary | ICD-10-CM | POA: Insufficient documentation

## 2013-08-27 LAB — CBC WITH DIFFERENTIAL/PLATELET
Basophils Absolute: 0.1 10*3/uL (ref 0.0–0.1)
Eosinophils Relative: 6 % — ABNORMAL HIGH (ref 0–5)
HCT: 24.5 % — ABNORMAL LOW (ref 36.0–46.0)
Lymphocytes Relative: 6 % — ABNORMAL LOW (ref 12–46)
Lymphs Abs: 0.3 10*3/uL — ABNORMAL LOW (ref 0.7–4.0)
MCV: 85.4 fL (ref 78.0–100.0)
Monocytes Absolute: 0 10*3/uL — ABNORMAL LOW (ref 0.1–1.0)
Monocytes Relative: 0 % — ABNORMAL LOW (ref 3–12)
Neutro Abs: 4.2 10*3/uL (ref 1.7–7.7)
Platelets: 261 10*3/uL (ref 150–400)
RBC: 2.87 MIL/uL — ABNORMAL LOW (ref 3.87–5.11)
WBC: 4.8 10*3/uL (ref 4.0–10.5)

## 2013-08-27 LAB — URINALYSIS, ROUTINE W REFLEX MICROSCOPIC
Hgb urine dipstick: NEGATIVE
Ketones, ur: NEGATIVE mg/dL
Leukocytes, UA: NEGATIVE
Nitrite: NEGATIVE
pH: 6 (ref 5.0–8.0)

## 2013-08-27 LAB — COMPREHENSIVE METABOLIC PANEL
ALT: 25 U/L (ref 0–35)
AST: 17 U/L (ref 0–37)
Alkaline Phosphatase: 56 U/L (ref 39–117)
BUN: 23 mg/dL (ref 6–23)
CO2: 23 mEq/L (ref 19–32)
Calcium: 9.2 mg/dL (ref 8.4–10.5)
Chloride: 102 mEq/L (ref 96–112)
Creatinine, Ser: 0.78 mg/dL (ref 0.50–1.10)
GFR calc Af Amer: 85 mL/min — ABNORMAL LOW (ref >90)
GFR calc non Af Amer: 74 mL/min — ABNORMAL LOW (ref >90)
Glucose, Bld: 118 mg/dL — ABNORMAL HIGH (ref 70–99)
Total Bilirubin: 1.9 mg/dL — ABNORMAL HIGH (ref 0.3–1.2)

## 2013-08-27 MED ORDER — SODIUM CHLORIDE 0.9 % IV BOLUS (SEPSIS): 500.0000 mL | Freq: Once | INTRAVENOUS | Status: DC

## 2013-08-27 NOTE — ED Notes (Signed)
Pt had fever 103 this am took 2 tylenol and used ice packs. Fever now 99.3 . Pt has hx Cancer pt had blood transfusion on Friday. Pt had vomiting and nausea since last night

## 2013-08-27 NOTE — ED Provider Notes (Signed)
CSN: [id number]     Arrival date & time 08/27/13  1058 History   First MD Initiated Contact with Patient 08/27/13 1111     Chief Complaint  Patient presents with  . Fever   (Consider location/radiation/quality/duration/timing/severity/associated sxs/prior Treatment) Patient is a 79 y.o. female presenting with fever. The history is provided by the patient (the pt had chills last night and fever today.  pt feels a little weak today).  Fever Temp source:  Oral Severity:  Moderate Onset quality:  Sudden Timing:  Intermittent Progression:  Improving Chronicity:  New Relieved by:  None tried Worsened by:  Nothing tried Associated symptoms: no chest pain, no congestion, no cough, no diarrhea, no headaches and no rash     Past Medical History  Diagnosis Date  . Anemia, unspecified   . Hypertension   . Thyroid disease   . Hypercholesteremia   . MDS (myelodysplastic syndrome) with 5q deletion 07/11/2013   Past Surgical History  Procedure Laterality Date  . Abdominal hysterectomy    . Back surgery    . Arthroscopy right knee    . Arthroscopy left knee     Family History  Problem Relation Age of Onset  . Colon cancer Daughter   . Heart disease Maternal Grandmother   . Heart disease Paternal Grandfather    History  Substance Use Topics  . Smoking status: Never Smoker   . Smokeless tobacco: Never Used  . Alcohol Use: No   OB History   Grav Para Term Preterm Abortions TAB SAB Ect Mult Living                 Review of Systems  Constitutional: Positive for fever. Negative for appetite change and fatigue.  HENT: Negative for congestion, ear discharge and sinus pressure.   Eyes: Negative for discharge.  Respiratory: Negative for cough.   Cardiovascular: Negative for chest pain.  Gastrointestinal: Negative for abdominal pain and diarrhea.  Genitourinary: Negative for frequency and hematuria.  Musculoskeletal: Negative for back pain.  Skin: Negative for rash.  Neurological:  Negative for seizures and headaches.  Psychiatric/Behavioral: Negative for hallucinations.    Allergies  Novocain; Oxycodone; Codeine; Methimazole; and Revlimid  Home Medications   Current Outpatient Rx  Name  Route  Sig  Dispense  Refill  . acetaminophen (TYLENOL) 500 MG tablet   Oral   Take 1,000 mg by mouth every 6 (six) hours as needed for fever.         Marland Kitchen acitretin (SORIATANE) 25 MG capsule   Oral   Take 25 mg by mouth every Monday, Wednesday, and Friday. Off on Nov. 5th for one week then resume.         Marland Kitchen aspirin 81 MG tablet   Oral   Take 81 mg by mouth every morning.          . cholecalciferol (VITAMIN D) 400 UNITS TABS   Oral   Take 400 Units by mouth every morning.          . COMPRO 25 MG suppository   Rectal   Place 25 mg rectally every 8 (eight) hours as needed for nausea or vomiting.          . hydrOXYzine (ATARAX/VISTARIL) 25 MG tablet   Oral   Take 1 tablet (25 mg total) by mouth every 4 (four) hours as needed for itching.   60 tablet   3   . LORazepam (ATIVAN) 0.5 MG tablet   Oral   Take 0.5 mg  by mouth at bedtime.          . metoprolol succinate (TOPROL-XL) 100 MG 24 hr tablet   Oral   Take 100 mg by mouth every morning.          . mometasone (ELOCON) 0.1 % cream   Topical   Apply 1 application topically daily as needed (psoriasis).          . Multiple Vitamin (MULTIVITAMIN) tablet   Oral   Take 1 tablet by mouth every morning.          . nisoldipine (SULAR) 25.5 MG 24 hr tablet   Oral   Take 25.5 mg by mouth every morning.          . olmesartan-hydrochlorothiazide (BENICAR HCT) 40-25 MG per tablet   Oral   Take 1 tablet by mouth every morning.          . ondansetron (ZOFRAN) 8 MG tablet   Oral   Take 8 mg by mouth every 8 (eight) hours as needed for nausea or vomiting.          . potassium chloride SA (K-DUR,KLOR-CON) 20 MEQ tablet   Oral   Take 20 mEq by mouth every morning.          . pravastatin  (PRAVACHOL) 40 MG tablet   Oral   Take 40 mg by mouth at bedtime.          . predniSONE (DELTASONE) 5 MG tablet   Oral   Take 1 tablet (5 mg total) by mouth daily.   90 tablet   0   . prochlorperazine (COMPAZINE) 10 MG tablet   Oral   Take 10 mg by mouth every 8 (eight) hours as needed for vomiting.          . promethazine (PHENERGAN) 25 MG tablet   Oral   Take 25 mg by mouth every 6 (six) hours as needed for nausea or vomiting.         . triamcinolone cream (KENALOG) 0.1 %   Topical   Apply 1 application topically daily as needed (psoriasis).           BP 137/44  Pulse 84  Temp(Src) 99.3 F (37.4 C) (Oral)  Resp 18  SpO2 90% Physical Exam  Constitutional: She is oriented to person, place, and time. She appears well-developed.  HENT:  Head: Normocephalic.  Eyes: Conjunctivae and EOM are normal. No scleral icterus.  Neck: Neck supple. No thyromegaly present.  Cardiovascular: Normal rate and regular rhythm.  Exam reveals no gallop and no friction rub.   No murmur heard. Pulmonary/Chest: No stridor. She has no wheezes. She has no rales. She exhibits no tenderness.  Abdominal: She exhibits no distension. There is no tenderness. There is no rebound.  Musculoskeletal: Normal range of motion. She exhibits no edema.  Lymphadenopathy:    She has no cervical adenopathy.  Neurological: She is oriented to person, place, and time. She exhibits normal muscle tone. Coordination normal.  Skin: No rash noted. No erythema.  Psychiatric: She has a normal mood and affect. Her behavior is normal.    ED Course  Procedures (including critical care time) Labs Review Labs Reviewed  CBC WITH DIFFERENTIAL - Abnormal; Notable for the following:    RBC 2.87 (*)    Hemoglobin 8.4 (*)    HCT 24.5 (*)    RDW 16.4 (*)    Neutrophils Relative % 87 (*)    Lymphocytes Relative 6 (*)  Lymphs Abs 0.3 (*)    Monocytes Relative 0 (*)    Monocytes Absolute 0.0 (*)    Eosinophils  Relative 6 (*)    Basophils Relative 2 (*)    All other components within normal limits  COMPREHENSIVE METABOLIC PANEL - Abnormal; Notable for the following:    Potassium 3.4 (*)    Glucose, Bld 118 (*)    Total Bilirubin 1.9 (*)    GFR calc non Af Amer 74 (*)    GFR calc Af Amer 85 (*)    All other components within normal limits  CULTURE, BLOOD (ROUTINE X 2)  CULTURE, BLOOD (ROUTINE X 2)  URINALYSIS, ROUTINE W REFLEX MICROSCOPIC   Imaging Review Dg Chest 2 View  08/27/2013   CLINICAL DATA:  Fever, dizziness.  EXAM: CHEST  2 VIEW  COMPARISON:  None.  FINDINGS: Cardiomegaly. There is deviation of the trachea to the left with soft tissue prominence in the superior mediastinum felt to be related to thyroid goiter. No confluent airspace opacities. No effusions. No acute bony abnormality.  IMPRESSION: Cardiomegaly.  No active cardiopulmonary disease.  Suspect thyroid goiter.   Electronically Signed   By: Charlett Nose M.D.   On: 08/27/2013 11:56   Ct Head Wo Contrast  08/27/2013   CLINICAL DATA:  Fever and hypotension  EXAM: CT HEAD WITHOUT CONTRAST  TECHNIQUE: Contiguous axial images were obtained from the base of the skull through the vertex without intravenous contrast.  COMPARISON:  08/25/2012  FINDINGS: Ventricle size is normal.  Cerebral volume normal for age.  Negative for intracranial hemorrhage, mass, or acute infarct.  No acute skull abnormality.  Small cyst in the left maxillary sinus.  IMPRESSION: Negative   Electronically Signed   By: Marlan Palau M.D.   On: 08/27/2013 11:54    EKG Interpretation   None       MDM   1. Fever    Pt to follow up with pcp tomorrow    Benny Lennert, MD 08/27/13 1323

## 2013-08-27 NOTE — ED Notes (Signed)
She tells me she has had multiple blood transfusions since this Sep't., the most recent of which was two days ago.  She is here with c/o feeling a "chill" yesterday evening, followed by nausea and some minimal vomiting.  She states she took an oral Zofran today, and at the moment her nausea is relieved.  She is oriented x 4 and in no distress.

## 2013-08-28 ENCOUNTER — Other Ambulatory Visit: Payer: Self-pay | Admitting: Hematology and Oncology

## 2013-08-28 DIAGNOSIS — D46C Myelodysplastic syndrome with isolated del(5q) chromosomal abnormality: Secondary | ICD-10-CM

## 2013-09-01 ENCOUNTER — Other Ambulatory Visit (HOSPITAL_BASED_OUTPATIENT_CLINIC_OR_DEPARTMENT_OTHER): Payer: Medicare Other

## 2013-09-01 ENCOUNTER — Ambulatory Visit (HOSPITAL_BASED_OUTPATIENT_CLINIC_OR_DEPARTMENT_OTHER): Payer: Medicare Other

## 2013-09-01 ENCOUNTER — Other Ambulatory Visit: Payer: Self-pay | Admitting: *Deleted

## 2013-09-01 VITALS — BP 133/44 | HR 71 | Temp 97.0°F | Resp 18

## 2013-09-01 DIAGNOSIS — D46C Myelodysplastic syndrome with isolated del(5q) chromosomal abnormality: Secondary | ICD-10-CM

## 2013-09-01 DIAGNOSIS — D649 Anemia, unspecified: Secondary | ICD-10-CM

## 2013-09-01 LAB — PREPARE RBC (CROSSMATCH)

## 2013-09-01 LAB — CBC WITH DIFFERENTIAL/PLATELET
Basophils Absolute: 0.2 10*3/uL — ABNORMAL HIGH (ref 0.0–0.1)
EOS%: 21.1 % — ABNORMAL HIGH (ref 0.0–7.0)
Eosinophils Absolute: 0.5 10*3/uL (ref 0.0–0.5)
HCT: 22.1 % — ABNORMAL LOW (ref 34.8–46.6)
HGB: 7.2 g/dL — ABNORMAL LOW (ref 11.6–15.9)
MCH: 27.8 pg (ref 25.1–34.0)
MCHC: 32.6 g/dL (ref 31.5–36.0)
MCV: 85.3 fL (ref 79.5–101.0)
MONO%: 0.8 % (ref 0.0–14.0)
NEUT%: 50.8 % (ref 38.4–76.8)
Platelets: 257 10*3/uL (ref 145–400)
RDW: 16.8 % — ABNORMAL HIGH (ref 11.2–14.5)
lymph#: 0.5 10*3/uL — ABNORMAL LOW (ref 0.9–3.3)
nRBC: 0 % (ref 0–0)

## 2013-09-01 LAB — HOLD TUBE, BLOOD BANK

## 2013-09-01 MED ORDER — ACETAMINOPHEN 325 MG PO TABS
ORAL_TABLET | ORAL | Status: AC
  Filled 2013-09-01: qty 2

## 2013-09-01 MED ORDER — DIPHENHYDRAMINE HCL 25 MG PO CAPS
25.0000 mg | ORAL_CAPSULE | Freq: Once | ORAL | Status: DC
  Administered 2013-09-01: 25 mg via ORAL

## 2013-09-01 MED ORDER — ACETAMINOPHEN 325 MG PO TABS
650.0000 mg | ORAL_TABLET | Freq: Once | ORAL | Status: DC
  Administered 2013-09-01: 650 mg via ORAL

## 2013-09-01 MED ORDER — SODIUM CHLORIDE 0.9 % IV SOLN
250.0000 mL | Freq: Once | INTRAVENOUS | Status: AC
  Administered 2013-09-01: 250 mL via INTRAVENOUS

## 2013-09-01 MED ORDER — DIPHENHYDRAMINE HCL 25 MG PO CAPS
ORAL_CAPSULE | ORAL | Status: AC
  Filled 2013-09-01: qty 1

## 2013-09-01 NOTE — Progress Notes (Signed)
Per pt she is to have 2 tylenol and 1 benadryl with her blood transfusion, she did not get it with last transfusion and ended up in the ER the next day from a reaction.

## 2013-09-01 NOTE — Patient Instructions (Signed)
Blood Transfusion Information WHAT IS A BLOOD TRANSFUSION? A transfusion is the replacement of blood or some of its parts. Blood is made up of multiple cells which provide different functions.  Red blood cells carry oxygen and are used for blood loss replacement.  White blood cells fight against infection.  Platelets control bleeding.  Plasma helps clot blood.  Other blood products are available for specialized needs, such as hemophilia or other clotting disorders. BEFORE THE TRANSFUSION  Who gives blood for transfusions?   You may be able to donate blood to be used at a later date on yourself (autologous donation).  Relatives can be asked to donate blood. This is generally not any safer than if you have received blood from a stranger. The same precautions are taken to ensure safety when a relative's blood is donated.  Healthy volunteers who are fully evaluated to make sure their blood is safe. This is blood bank blood. Transfusion therapy is the safest it has ever been in the practice of medicine. Before blood is taken from a donor, a complete history is taken to make sure that person has no history of diseases nor engages in risky social behavior (examples are intravenous drug use or sexual activity with multiple partners). The donor's travel history is screened to minimize risk of transmitting infections, such as malaria. The donated blood is tested for signs of infectious diseases, such as HIV and hepatitis. The blood is then tested to be sure it is compatible with you in order to minimize the chance of a transfusion reaction. If you or a relative donates blood, this is often done in anticipation of surgery and is not appropriate for emergency situations. It takes many days to process the donated blood. RISKS AND COMPLICATIONS Although transfusion therapy is very safe and saves many lives, the main dangers of transfusion include:   Getting an infectious disease.  Developing a  transfusion reaction. This is an allergic reaction to something in the blood you were given. Every precaution is taken to prevent this. The decision to have a blood transfusion has been considered carefully by your caregiver before blood is given. Blood is not given unless the benefits outweigh the risks. AFTER THE TRANSFUSION  Right after receiving a blood transfusion, you will usually feel much better and more energetic. This is especially true if your red blood cells have gotten low (anemic). The transfusion raises the level of the red blood cells which carry oxygen, and this usually causes an energy increase.  The nurse administering the transfusion will monitor you carefully for complications. HOME CARE INSTRUCTIONS  No special instructions are needed after a transfusion. You may find your energy is better. Speak with your caregiver about any limitations on activity for underlying diseases you may have. SEEK MEDICAL CARE IF:   Your condition is not improving after your transfusion.  You develop redness or irritation at the intravenous (IV) site. SEEK IMMEDIATE MEDICAL CARE IF:  Any of the following symptoms occur over the next 12 hours:  Shaking chills.  You have a temperature by mouth above 102 F (38.9 C), not controlled by medicine.  Chest, back, or muscle pain.  People around you feel you are not acting correctly or are confused.  Shortness of breath or difficulty breathing.  Dizziness and fainting.  You get a rash or develop hives.  You have a decrease in urine output.  Your urine turns a dark color or changes to pink, red, or brown. Any of the following   symptoms occur over the next 10 days:  You have a temperature by mouth above 102 F (38.9 C), not controlled by medicine.  Shortness of breath.  Weakness after normal activity.  The white part of the eye turns yellow (jaundice).  You have a decrease in the amount of urine or are urinating less often.  Your  urine turns a dark color or changes to pink, red, or brown. Document Released: 09/18/2000 Document Revised: 12/14/2011 Document Reviewed: 05/07/2008 [employer] Patient Information 2014 [employer].  

## 2013-09-02 LAB — CULTURE, BLOOD (ROUTINE X 2)
Culture: NO GROWTH
Culture: NO GROWTH

## 2013-09-02 LAB — TYPE AND SCREEN

## 2013-09-04 ENCOUNTER — Telehealth: Payer: Self-pay | Admitting: Hematology and Oncology

## 2013-09-04 ENCOUNTER — Other Ambulatory Visit (HOSPITAL_BASED_OUTPATIENT_CLINIC_OR_DEPARTMENT_OTHER): Payer: Medicare Other | Admitting: Lab

## 2013-09-04 ENCOUNTER — Ambulatory Visit (HOSPITAL_BASED_OUTPATIENT_CLINIC_OR_DEPARTMENT_OTHER): Payer: Medicare Other | Admitting: Hematology and Oncology

## 2013-09-04 ENCOUNTER — Telehealth: Payer: Self-pay | Admitting: *Deleted

## 2013-09-04 ENCOUNTER — Ambulatory Visit: Payer: Medicare Other

## 2013-09-04 VITALS — BP 129/39 | HR 73 | Temp 98.2°F | Resp 18 | Ht 60.0 in | Wt 133.7 lb

## 2013-09-04 DIAGNOSIS — D46C Myelodysplastic syndrome with isolated del(5q) chromosomal abnormality: Secondary | ICD-10-CM

## 2013-09-04 DIAGNOSIS — D72819 Decreased white blood cell count, unspecified: Secondary | ICD-10-CM

## 2013-09-04 DIAGNOSIS — D649 Anemia, unspecified: Secondary | ICD-10-CM

## 2013-09-04 DIAGNOSIS — D709 Neutropenia, unspecified: Secondary | ICD-10-CM

## 2013-09-04 LAB — HOLD TUBE, BLOOD BANK

## 2013-09-04 LAB — COMPREHENSIVE METABOLIC PANEL (CC13)
ALT: 26 U/L (ref 0–55)
Alkaline Phosphatase: 57 U/L (ref 40–150)
Anion Gap: 9 mEq/L (ref 3–11)
BUN: 19.4 mg/dL (ref 7.0–26.0)
CO2: 24 mEq/L (ref 22–29)
Calcium: 9.7 mg/dL (ref 8.4–10.4)
Creatinine: 0.7 mg/dL (ref 0.6–1.1)
Sodium: 142 mEq/L (ref 136–145)
Total Bilirubin: 0.88 mg/dL (ref 0.20–1.20)
Total Protein: 6.8 g/dL (ref 6.4–8.3)

## 2013-09-04 LAB — CBC WITH DIFFERENTIAL/PLATELET
Basophils Absolute: 0.2 10*3/uL — ABNORMAL HIGH (ref 0.0–0.1)
Eosinophils Absolute: 1.2 10*3/uL — ABNORMAL HIGH (ref 0.0–0.5)
HCT: 26.1 % — ABNORMAL LOW (ref 34.8–46.6)
MCV: 86.4 fL (ref 79.5–101.0)
MONO%: 1.1 % (ref 0.0–14.0)
NEUT#: 0.7 10*3/uL — ABNORMAL LOW (ref 1.5–6.5)
NEUT%: 27.3 % — ABNORMAL LOW (ref 38.4–76.8)
RBC: 3.02 10*6/uL — ABNORMAL LOW (ref 3.70–5.45)
RDW: 16.1 % — ABNORMAL HIGH (ref 11.2–14.5)
WBC: 2.7 10*3/uL — ABNORMAL LOW (ref 3.9–10.3)

## 2013-09-04 LAB — TECHNOLOGIST REVIEW

## 2013-09-04 NOTE — Telephone Encounter (Signed)
Md visit has to be put in by [facility], gave her appt order, emailed Marcelino Duster regarding chemo followed by VM since oncology order different form P[facility]

## 2013-09-04 NOTE — Progress Notes (Signed)
[facility] OFFICE PROGRESS NOTE  Patient Care Team: Lupita Raider, MD as PCP - General (Family Medicine)  DIAGNOSIS: Myelodysplastic syndrome with severe anemia  SUMMARY OF ONCOLOGIC HISTORY: This is a very pleasant 79 year old lady who become transfusion dependent over the last 3 months. She has received 6 units of blood so far, the first in July, the second one in August and most recently today. She denies any bleeding such as epistaxis, hematuria, or hematochezia. 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 Erythropoietin stimulating agents were stopped. In October 2014, she was started on Revlimid however develop severe allergic reaction to Revlimid. That was discontinued. On 08/07/2013, we started her on Vidaza  INTERVAL HISTORY: Rebekah Cross 79 y.o. female returns for further followup. She has been receiving blood transfusion on a weekly basis. Recently she went to the emergency department due to low-grade fever and blisters in her nasal passages. Viral screen was negative. The patient denies any recent signs or symptoms of bleeding such as spontaneous epistaxis, hematuria or hematochezia. She complained of profound fatigue.  I have reviewed the past medical history, past surgical history, social history and family history with the patient and they are unchanged from previous note.  ALLERGIES:  is allergic to novocain; oxycodone; codeine; methimazole; and revlimid.  MEDICATIONS:  Current Outpatient Prescriptions  Medication Sig Dispense Refill  . acetaminophen (TYLENOL) 500 MG tablet Take 1,000 mg by mouth every 6 (six) hours as needed for fever.      Marland Kitchen aspirin 81 MG tablet Take 81 mg by mouth every morning.       . cholecalciferol (VITAMIN D) 400 UNITS TABS Take 400 Units by mouth every morning.       . COMPRO 25 MG suppository Place 25 mg rectally every 8 (eight) hours  as needed for nausea or vomiting.       . hydrOXYzine (ATARAX/VISTARIL) 25 MG tablet Take 1 tablet (25 mg total) by mouth every 4 (four) hours as needed for itching.  60 tablet  3  . LORazepam (ATIVAN) 0.5 MG tablet Take 0.5 mg by mouth at bedtime.       . metoprolol succinate (TOPROL-XL) 100 MG 24 hr tablet Take 100 mg by mouth every morning.       . mometasone (ELOCON) 0.1 % cream Apply 1 application topically daily as needed (psoriasis).       . Multiple Vitamin (MULTIVITAMIN) tablet Take 1 tablet by mouth every morning.       . nisoldipine (SULAR) 25.5 MG 24 hr tablet Take 25.5 mg by mouth every morning.       . olmesartan-hydrochlorothiazide (BENICAR HCT) 40-25 MG per tablet Take 1 tablet by mouth every morning.       . ondansetron (ZOFRAN) 8 MG tablet Take 8 mg by mouth every 8 (eight) hours as needed for nausea or vomiting.       . potassium chloride SA (K-DUR,KLOR-CON) 20 MEQ tablet Take 20 mEq by mouth every morning.       . pravastatin (PRAVACHOL) 40 MG tablet Take 40 mg by mouth at bedtime.       . predniSONE (DELTASONE) 5 MG tablet Take 1 tablet (5 mg total) by mouth daily.  90 tablet  0  . prochlorperazine (COMPAZINE) 10 MG tablet Take 10 mg by mouth every 8 (eight) hours as needed for vomiting.       Marland Kitchen  promethazine (PHENERGAN) 25 MG tablet Take 25 mg by mouth every 6 (six) hours as needed for nausea or vomiting.      . triamcinolone cream (KENALOG) 0.1 % Apply 1 application topically daily as needed (psoriasis).       Marland Kitchen acitretin (SORIATANE) 25 MG capsule Take 25 mg by mouth every Monday, Wednesday, and Friday. Off on Nov. 5th for one week then resume.       No current facility-administered medications for this visit.    REVIEW OF SYSTEMS:   Constitutional: Denies fevers, chills or abnormal weight loss Eyes: Denies blurriness of vision Ears, nose, mouth, throat, and face: Denies mucositis or sore throat Respiratory: Denies cough, dyspnea or wheezes Cardiovascular: Denies  palpitation, chest discomfort or lower extremity swelling Gastrointestinal:  Denies nausea, heartburn or change in bowel habits Lymphatics: Denies new lymphadenopathy or easy bruising Neurological:Denies numbness, tingling or new weaknesses Behavioral/Psych: Mood is stable, no new changes  All other systems were reviewed with the patient and are negative.  PHYSICAL EXAMINATION: ECOG PERFORMANCE STATUS: 2 - Symptomatic, <50% confined to bed  Filed Vitals:   09/04/13 1055  BP: 129/39  Pulse: 73  Temp: 98.2 F (36.8 C)  Resp: 18   Filed Weights   09/04/13 1055  Weight: 133 lb 11.2 oz (60.646 kg)    GENERAL:alert, no distress and comfortable. She looked pale SKIN: Well-healed blisters around her nasal passage. EYES: normal, Conjunctiva are pink and non-injected, sclera clear OROPHARYNX:no exudate, no erythema and lips, buccal mucosa, and tongue normal  NECK: supple, thyroid normal size, non-tender, without nodularity LYMPH:  no palpable lymphadenopathy in the cervical, axillary or inguinal LUNGS: clear to auscultation and percussion with normal breathing effort HEART: regular rate & rhythm and no murmurs and no lower extremity edema ABDOMEN:abdomen soft, non-tender and normal bowel sounds Musculoskeletal:no cyanosis of digits and no clubbing  NEURO: alert & oriented x 3 with fluent speech, no focal motor/sensory deficits  LABORATORY DATA:  I have reviewed the data as listed    Component Value Date/Time   NA 142 09/04/2013 1043   NA 136 08/27/2013 1212   K 4.1 09/04/2013 1043   K 3.4* 08/27/2013 1212   CL 102 08/27/2013 1212   CO2 24 09/04/2013 1043   CO2 23 08/27/2013 1212   GLUCOSE 104 09/04/2013 1043   GLUCOSE 118* 08/27/2013 1212   BUN 19.4 09/04/2013 1043   BUN 23 08/27/2013 1212   CREATININE 0.7 09/04/2013 1043   CREATININE 0.78 08/27/2013 1212   CALCIUM 9.7 09/04/2013 1043   CALCIUM 9.2 08/27/2013 1212   PROT 6.8 09/04/2013 1043   PROT 6.6 08/27/2013 1212   ALBUMIN  3.8 09/04/2013 1043   ALBUMIN 3.8 08/27/2013 1212   AST 12 09/04/2013 1043   AST 17 08/27/2013 1212   ALT 26 09/04/2013 1043   ALT 25 08/27/2013 1212   ALKPHOS 57 09/04/2013 1043   ALKPHOS 56 08/27/2013 1212   BILITOT 0.88 09/04/2013 1043   BILITOT 1.9* 08/27/2013 1212   GFRNONAA 74* 08/27/2013 1212   GFRAA 85* 08/27/2013 1212    No results found for this basename: SPEP,  UPEP,   kappa and lambda light chains    Lab Results  Component Value Date   WBC 2.7* 09/04/2013   NEUTROABS 0.7* 09/04/2013   HGB 8.6* 09/04/2013   HCT 26.1* 09/04/2013   MCV 86.4 09/04/2013   PLT 347 09/04/2013      Chemistry      Component Value Date/Time   NA  142 09/04/2013 1043   NA 136 08/27/2013 1212   K 4.1 09/04/2013 1043   K 3.4* 08/27/2013 1212   CL 102 08/27/2013 1212   CO2 24 09/04/2013 1043   CO2 23 08/27/2013 1212   BUN 19.4 09/04/2013 1043   BUN 23 08/27/2013 1212   CREATININE 0.7 09/04/2013 1043   CREATININE 0.78 08/27/2013 1212      Component Value Date/Time   CALCIUM 9.7 09/04/2013 1043   CALCIUM 9.2 08/27/2013 1212   ALKPHOS 57 09/04/2013 1043   ALKPHOS 56 08/27/2013 1212   AST 12 09/04/2013 1043   AST 17 08/27/2013 1212   ALT 26 09/04/2013 1043   ALT 25 08/27/2013 1212   BILITOT 0.88 09/04/2013 1043   BILITOT 1.9* 08/27/2013 1212     ASSESSMENT & PLAN:  #1 myelodysplastic syndrome I will hold her chemotherapy this week due to neutropenia. I have rescheduled for next week. Due to recent side effects of treatment, I will see her on a weekly basis. #2 anemia She was transfused last week.This is likely due to recent treatment. The patient denies recent history of bleeding such as epistaxis, hematuria or hematochezia. She is asymptomatic from the anemia. I will observe for now.  She does not require transfusion now. I will continue the chemotherapy at current dose without dosage adjustment.  If the anemia gets progressive worse in the future, I might have to delay her treatment or adjust the  chemotherapy dose. #3 leukopenia This is due to recent side effects of treatment. We will observe for now. #4 healed blisters around her mouth This is related to herpes simplex. It is healing well. I could not feel she needs treatment right now #5 recent fever It is thought to be related to recent transfusion. I would premedicate her before a transfusion in the future.  Orders Placed This Encounter  Procedures  . Comprehensive metabolic panel    Standing Status: Future     Number of Occurrences:      Standing Expiration Date: 09/04/2014   All questions were answered. The patient knows to call the clinic with any problems, questions or concerns. No barriers to learning was detected.    Harbert Fitterer, MD 09/04/2013 12:15 PM

## 2013-09-04 NOTE — Telephone Encounter (Signed)
Per staff message and POF I have scheduled appts.  JMW  

## 2013-09-04 NOTE — Telephone Encounter (Signed)
Faxed pt medical records to [facility] [place] [address]

## 2013-09-05 ENCOUNTER — Ambulatory Visit: Payer: Medicare Other

## 2013-09-06 ENCOUNTER — Ambulatory Visit: Payer: Medicare Other

## 2013-09-07 ENCOUNTER — Ambulatory Visit: Payer: Medicare Other

## 2013-09-08 ENCOUNTER — Ambulatory Visit: Payer: Medicare Other

## 2013-09-11 ENCOUNTER — Other Ambulatory Visit: Payer: Self-pay | Admitting: Hematology and Oncology

## 2013-09-11 ENCOUNTER — Telehealth: Payer: Self-pay | Admitting: Hematology and Oncology

## 2013-09-11 ENCOUNTER — Ambulatory Visit (HOSPITAL_BASED_OUTPATIENT_CLINIC_OR_DEPARTMENT_OTHER): Payer: Medicare Other

## 2013-09-11 ENCOUNTER — Other Ambulatory Visit (HOSPITAL_BASED_OUTPATIENT_CLINIC_OR_DEPARTMENT_OTHER): Payer: Medicare Other | Admitting: Lab

## 2013-09-11 VITALS — BP 137/46 | HR 76 | Temp 96.8°F

## 2013-09-11 DIAGNOSIS — D46C Myelodysplastic syndrome with isolated del(5q) chromosomal abnormality: Secondary | ICD-10-CM

## 2013-09-11 DIAGNOSIS — Z5111 Encounter for antineoplastic chemotherapy: Secondary | ICD-10-CM

## 2013-09-11 LAB — CBC WITH DIFFERENTIAL/PLATELET
BASO%: 2.3 % — ABNORMAL HIGH (ref 0.0–2.0)
Basophils Absolute: 0.1 10*3/uL (ref 0.0–0.1)
EOS%: 19.2 % — ABNORMAL HIGH (ref 0.0–7.0)
HCT: 24.3 % — ABNORMAL LOW (ref 34.8–46.6)
HGB: 8.1 g/dL — ABNORMAL LOW (ref 11.6–15.9)
LYMPH%: 19.8 % (ref 14.0–49.7)
MCH: 28.9 pg (ref 25.1–34.0)
MCV: 87 fL (ref 79.5–101.0)
MONO%: 5.1 % (ref 0.0–14.0)
NEUT%: 53.6 % (ref 38.4–76.8)
Platelets: 474 10*3/uL — ABNORMAL HIGH (ref 145–400)
RBC: 2.79 10*6/uL — ABNORMAL LOW (ref 3.70–5.45)
lymph#: 0.6 10*3/uL — ABNORMAL LOW (ref 0.9–3.3)

## 2013-09-11 LAB — COMPREHENSIVE METABOLIC PANEL (CC13)
Alkaline Phosphatase: 56 U/L (ref 40–150)
Anion Gap: 9 mEq/L (ref 3–11)
BUN: 19.7 mg/dL (ref 7.0–26.0)
Glucose: 101 mg/dl (ref 70–140)
Total Bilirubin: 0.67 mg/dL (ref 0.20–1.20)

## 2013-09-11 MED ORDER — AZACITIDINE CHEMO SQ INJECTION
120.0000 mg | Freq: Once | INTRAMUSCULAR | Status: AC
  Administered 2013-09-11: 120 mg via SUBCUTANEOUS
  Filled 2013-09-11: qty 4.8

## 2013-09-11 MED ORDER — ONDANSETRON HCL 8 MG PO TABS
8.0000 mg | ORAL_TABLET | Freq: Once | ORAL | Status: AC
  Administered 2013-09-11: 8 mg via ORAL

## 2013-09-11 MED ORDER — ONDANSETRON HCL 8 MG PO TABS
ORAL_TABLET | ORAL | Status: AC
  Filled 2013-09-11: qty 1

## 2013-09-11 NOTE — Progress Notes (Signed)
Per Dr. Bertis Ruddy,  [facility] to treat with Hgb 8.1.  She plans to recheck CBC on Thursday and transfuse if Hbg less than 8.0.  Pt is aware.

## 2013-09-11 NOTE — Patient Instructions (Signed)
[facility] Discharge Instructions for Patients Receiving Chemotherapy  Today you received the following chemotherapy agents Vidaza  To help prevent nausea and vomiting after your treatment, we encourage you to take your nausea medication as directed.   If you develop nausea and vomiting that is not controlled by your nausea medication, call the clinic.   BELOW ARE SYMPTOMS THAT SHOULD BE REPORTED IMMEDIATELY:  *FEVER GREATER THAN 100.5 F  *CHILLS WITH OR WITHOUT FEVER  NAUSEA AND VOMITING THAT IS NOT CONTROLLED WITH YOUR NAUSEA MEDICATION  *UNUSUAL SHORTNESS OF BREATH  *UNUSUAL BRUISING OR BLEEDING  TENDERNESS IN MOUTH AND THROAT WITH OR WITHOUT PRESENCE OF ULCERS  *URINARY PROBLEMS  *BOWEL PROBLEMS  UNUSUAL RASH Items with * indicate a potential emergency and should be followed up as soon as possible.  Feel free to call the clinic you have any questions or concerns. The clinic phone number is [phone].    

## 2013-09-11 NOTE — Telephone Encounter (Signed)
sw pt adv lab added to 12/11 appt per NG [facility] of 12/8 Pt voiced understanding shh

## 2013-09-12 ENCOUNTER — Encounter (HOSPITAL_COMMUNITY): Payer: Self-pay

## 2013-09-12 ENCOUNTER — Ambulatory Visit (HOSPITAL_BASED_OUTPATIENT_CLINIC_OR_DEPARTMENT_OTHER): Payer: Medicare Other

## 2013-09-12 VITALS — BP 143/47 | HR 74 | Temp 98.9°F | Resp 16

## 2013-09-12 DIAGNOSIS — D46C Myelodysplastic syndrome with isolated del(5q) chromosomal abnormality: Secondary | ICD-10-CM

## 2013-09-12 DIAGNOSIS — Z5111 Encounter for antineoplastic chemotherapy: Secondary | ICD-10-CM

## 2013-09-12 MED ORDER — AZACITIDINE CHEMO SQ INJECTION
76.0000 mg/m2 | Freq: Once | INTRAMUSCULAR | Status: AC
  Administered 2013-09-12: 120 mg via SUBCUTANEOUS
  Filled 2013-09-12: qty 4.8

## 2013-09-12 MED ORDER — ONDANSETRON HCL 8 MG PO TABS
ORAL_TABLET | ORAL | Status: AC
  Filled 2013-09-12: qty 1

## 2013-09-12 MED ORDER — ONDANSETRON HCL 8 MG PO TABS
8.0000 mg | ORAL_TABLET | Freq: Once | ORAL | Status: AC
  Administered 2013-09-12: 8 mg via ORAL

## 2013-09-12 NOTE — Patient Instructions (Signed)
Azacitidine suspension for injection (subcutaneous use)  What is this medicine?  AZACITIDINE (ay za SITE i deen) is a chemotherapy drug. This medicine reduces the growth of cancer cells and can suppress the immune system. It is used for treating myelodysplastic syndrome or some types of leukemia.  This medicine may be used for other purposes; ask your health care provider or pharmacist if you have questions.  COMMON BRAND NAME(S): Vidaza  What should I tell my health care provider before I take this medicine?  They need to know if you have any of these conditions:  -infection (especially a virus infection such as chickenpox, cold sores, or herpes)  -kidney disease  -liver disease  -liver tumors  -an unusual or allergic reaction to azacitidine, mannitol, other medicines, foods, dyes, or preservatives  -pregnant or trying to get pregnant  -breast-feeding  How should I use this medicine?  This medicine is for injection under the skin. It is administered in a hospital or clinic by a specially trained health care professional.  Talk to your pediatrician regarding the use of this medicine in children. While this drug may be prescribed for selected conditions, precautions do apply.  Overdosage: If you think you have taken too much of this medicine contact a poison control center or emergency room at once.  NOTE: This medicine is only for you. Do not share this medicine with others.  What if I miss a dose?  It is important not to miss your dose. Call your doctor or health care professional if you are unable to keep an appointment.  What may interact with this medicine?  -vaccines  Talk to your doctor or health care professional before taking any of these medicines:  -acetaminophen  -aspirin  -ibuprofen  -ketoprofen  -naproxen  This list may not describe all possible interactions. Give your health care provider a list of all the medicines, herbs, non-prescription drugs, or dietary supplements you use. Also tell them if you  smoke, drink alcohol, or use illegal drugs. Some items may interact with your medicine.  What should I watch for while using this medicine?  Visit your doctor for checks on your progress. This drug may make you feel generally unwell. This is not uncommon, as chemotherapy can affect healthy cells as well as cancer cells. Report any side effects. Continue your course of treatment even though you feel ill unless your doctor tells you to stop.  In some cases, you may be given additional medicines to help with side effects. Follow all directions for their use.  Call your doctor or health care professional for advice if you get a fever, chills or sore throat, or other symptoms of a cold or flu. Do not treat yourself. This drug decreases your body's ability to fight infections. Try to avoid being around people who are sick.  This medicine may increase your risk to bruise or bleed. Call your doctor or health care professional if you notice any unusual bleeding.  Be careful brushing and flossing your teeth or using a toothpick because you may get an infection or bleed more easily. If you have any dental work done, tell your dentist you are receiving this medicine.  Avoid taking products that contain aspirin, acetaminophen, ibuprofen, naproxen, or ketoprofen unless instructed by your doctor. These medicines may hide a fever.  Do not have any vaccinations without your doctor's approval and avoid anyone who has recently had oral polio vaccine.  Do not become pregnant while taking this medicine. Women should   inform their doctor if they wish to become pregnant or think they might be pregnant. There is a potential for serious side effects to an unborn child. Talk to your health care professional or pharmacist for more information. Do not breast-feed an infant while taking this medicine.  If you are a man, you should not father a child while receiving treatment.  What side effects may I notice from receiving this medicine?  Side  effects that you should report to your doctor or health care professional as soon as possible:  -allergic reactions like skin rash, itching or hives, swelling of the face, lips, or tongue  -low blood counts - this medicine may decrease the number of white blood cells, red blood cells and platelets. You may be at increased risk for infections and bleeding.  -signs of infection - fever or chills, cough, sore throat, pain or difficulty passing urine  -signs of decreased platelets or bleeding - bruising, pinpoint red spots on the skin, black, tarry stools, blood in the urine  -signs of decreased red blood cells - unusually weak or tired, fainting spells, lightheadedness  -reactions at the injection site including redness, pain, itching, or bruising  -breathing problems  -changes in vision  -fever  -mouth sores  -stomach pain  -vomiting  Side effects that usually do not require medical attention (report to your doctor or health care professional if they continue or are bothersome):  -constipation  -diarrhea  -loss of appetite  -nausea  -pain or redness at the injection site  -weak or tired  This list may not describe all possible side effects. Call your doctor for medical advice about side effects. You may report side effects to FDA at 1-800-FDA-1088.  Where should I keep my medicine?  This drug is given in a hospital or clinic and will not be stored at home.  NOTE: This sheet is a summary. It may not cover all possible information. If you have questions about this medicine, talk to your doctor, pharmacist, or health care provider.  © 2014, [employer]. (2007-12-15 11:04:07)

## 2013-09-13 ENCOUNTER — Ambulatory Visit (HOSPITAL_BASED_OUTPATIENT_CLINIC_OR_DEPARTMENT_OTHER): Payer: Medicare Other

## 2013-09-13 VITALS — BP 129/50 | HR 69 | Temp 97.7°F | Resp 18

## 2013-09-13 DIAGNOSIS — Z5111 Encounter for antineoplastic chemotherapy: Secondary | ICD-10-CM

## 2013-09-13 DIAGNOSIS — D46C Myelodysplastic syndrome with isolated del(5q) chromosomal abnormality: Secondary | ICD-10-CM

## 2013-09-13 MED ORDER — ONDANSETRON HCL 8 MG PO TABS
8.0000 mg | ORAL_TABLET | Freq: Once | ORAL | Status: AC
  Administered 2013-09-13: 8 mg via ORAL

## 2013-09-13 MED ORDER — ONDANSETRON HCL 8 MG PO TABS
ORAL_TABLET | ORAL | Status: AC
  Filled 2013-09-13: qty 1

## 2013-09-13 MED ORDER — AZACITIDINE CHEMO SQ INJECTION
120.0000 mg | Freq: Once | INTRAMUSCULAR | Status: AC
  Administered 2013-09-13: 120 mg via SUBCUTANEOUS
  Filled 2013-09-13: qty 4.8

## 2013-09-13 NOTE — Patient Instructions (Signed)
[facility] Discharge Instructions for Patients Receiving Chemotherapy  Today you received the following chemotherapy agents Vidaza.  To help prevent nausea and vomiting after your treatment, we encourage you to take your nausea medication as presecribed.    If you develop nausea and vomiting that is not controlled by your nausea medication, call the clinic.   BELOW ARE SYMPTOMS THAT SHOULD BE REPORTED IMMEDIATELY:  *FEVER GREATER THAN 100.5 F  *CHILLS WITH OR WITHOUT FEVER  NAUSEA AND VOMITING THAT IS NOT CONTROLLED WITH YOUR NAUSEA MEDICATION  *UNUSUAL SHORTNESS OF BREATH  *UNUSUAL BRUISING OR BLEEDING  TENDERNESS IN MOUTH AND THROAT WITH OR WITHOUT PRESENCE OF ULCERS  *URINARY PROBLEMS  *BOWEL PROBLEMS  UNUSUAL RASH Items with * indicate a potential emergency and should be followed up as soon as possible.  Feel free to call the clinic should you have any questions or concerns. The clinic phone number is [phone].  It was a pleasure to serve you today.

## 2013-09-14 ENCOUNTER — Ambulatory Visit (HOSPITAL_COMMUNITY)
Admission: RE | Admit: 2013-09-14 | Discharge: 2013-09-14 | Disposition: A | Discharge status: Home / Self Care | Payer: Medicare Other | Source: Ambulatory Visit | Attending: Hematology and Oncology | Admitting: Hematology and Oncology

## 2013-09-14 ENCOUNTER — Other Ambulatory Visit: Payer: Self-pay | Admitting: Hematology and Oncology

## 2013-09-14 ENCOUNTER — Ambulatory Visit (HOSPITAL_BASED_OUTPATIENT_CLINIC_OR_DEPARTMENT_OTHER): Payer: Medicare Other

## 2013-09-14 ENCOUNTER — Other Ambulatory Visit (HOSPITAL_BASED_OUTPATIENT_CLINIC_OR_DEPARTMENT_OTHER): Payer: Medicare Other

## 2013-09-14 VITALS — BP 160/71 | HR 66 | Temp 97.6°F | Resp 20

## 2013-09-14 DIAGNOSIS — D46C Myelodysplastic syndrome with isolated del(5q) chromosomal abnormality: Secondary | ICD-10-CM

## 2013-09-14 DIAGNOSIS — D630 Anemia in neoplastic disease: Secondary | ICD-10-CM

## 2013-09-14 DIAGNOSIS — Z5111 Encounter for antineoplastic chemotherapy: Secondary | ICD-10-CM

## 2013-09-14 LAB — CBC WITH DIFFERENTIAL/PLATELET
EOS%: 10.7 % — ABNORMAL HIGH (ref 0.0–7.0)
Eosinophils Absolute: 0.3 10*3/uL (ref 0.0–0.5)
HCT: 22.1 % — ABNORMAL LOW (ref 34.8–46.6)
LYMPH%: 15.4 % (ref 14.0–49.7)
MCH: 29.8 pg (ref 25.1–34.0)
MONO#: 0.1 10*3/uL (ref 0.1–0.9)
NEUT#: 2.2 10*3/uL (ref 1.5–6.5)
Platelets: 396 10*3/uL (ref 145–400)
RBC: 2.53 10*6/uL — ABNORMAL LOW (ref 3.70–5.45)
RDW: 16.1 % — ABNORMAL HIGH (ref 11.2–14.5)
WBC: 3.2 10*3/uL — ABNORMAL LOW (ref 3.9–10.3)
lymph#: 0.5 10*3/uL — ABNORMAL LOW (ref 0.9–3.3)

## 2013-09-14 LAB — HOLD TUBE, BLOOD BANK

## 2013-09-14 LAB — TECHNOLOGIST REVIEW

## 2013-09-14 MED ORDER — ONDANSETRON HCL 8 MG PO TABS
ORAL_TABLET | ORAL | Status: AC
  Filled 2013-09-14: qty 1

## 2013-09-14 MED ORDER — DIPHENHYDRAMINE HCL 25 MG PO CAPS
ORAL_CAPSULE | ORAL | Status: AC
  Filled 2013-09-14: qty 1

## 2013-09-14 MED ORDER — AZACITIDINE CHEMO SQ INJECTION
76.0000 mg/m2 | Freq: Once | INTRAMUSCULAR | Status: AC
  Administered 2013-09-14: 120 mg via SUBCUTANEOUS
  Filled 2013-09-14: qty 4.8

## 2013-09-14 MED ORDER — ACETAMINOPHEN 325 MG PO TABS
650.0000 mg | ORAL_TABLET | Freq: Once | ORAL | Status: AC
  Administered 2013-09-14: 650 mg via ORAL

## 2013-09-14 MED ORDER — SODIUM CHLORIDE 0.9 % IV SOLN
250.0000 mL | Freq: Once | INTRAVENOUS | Status: AC
  Administered 2013-09-14: 250 mL via INTRAVENOUS

## 2013-09-14 MED ORDER — ONDANSETRON HCL 8 MG PO TABS
8.0000 mg | ORAL_TABLET | Freq: Once | ORAL | Status: AC
  Administered 2013-09-14: 8 mg via ORAL

## 2013-09-14 MED ORDER — ACETAMINOPHEN 325 MG PO TABS
ORAL_TABLET | ORAL | Status: AC
  Filled 2013-09-14: qty 2

## 2013-09-14 MED ORDER — DIPHENHYDRAMINE HCL 25 MG PO CAPS
25.0000 mg | ORAL_CAPSULE | Freq: Once | ORAL | Status: AC
  Administered 2013-09-14: 25 mg via ORAL

## 2013-09-14 NOTE — Patient Instructions (Addendum)
[facility] Discharge Instructions for Patients Receiving Chemotherapy  Today you received the following chemotherapy agents: Vidaza  To help prevent nausea and vomiting after your treatment, we encourage you to take your nausea medication as ordered per MD.   If you develop nausea and vomiting that is not controlled by your nausea medication, call the clinic.   BELOW ARE SYMPTOMS THAT SHOULD BE REPORTED IMMEDIATELY:  *FEVER GREATER THAN 100.5 F  *CHILLS WITH OR WITHOUT FEVER  NAUSEA AND VOMITING THAT IS NOT CONTROLLED WITH YOUR NAUSEA MEDICATION  *UNUSUAL SHORTNESS OF BREATH  *UNUSUAL BRUISING OR BLEEDING  TENDERNESS IN MOUTH AND THROAT WITH OR WITHOUT PRESENCE OF ULCERS  *URINARY PROBLEMS  *BOWEL PROBLEMS  UNUSUAL RASH Items with * indicate a potential emergency and should be followed up as soon as possible.  Feel free to call the clinic you have any questions or concerns. The clinic phone number is [phone].   Blood Transfusion  A blood transfusion replaces your blood or some of its parts. Blood is replaced when you have lost blood because of surgery, an accident, or for severe blood conditions like anemia. You can donate blood to be used on yourself if you have a planned surgery. If you lose blood during that surgery, your own blood can be given back to you. Any blood given to you is checked to make sure it matches your blood type. Your temperature, blood pressure, and heart rate (vital signs) will be checked often.  GET HELP RIGHT AWAY IF:   You feel sick to your stomach (nauseous) or throw up (vomit).  You have watery poop (diarrhea).  You have shortness of breath or trouble breathing.  You have blood in your pee (urine) or have dark colored pee.  You have chest pain or tightness.  Your eyes or skin turn yellow (jaundice).  You have a temperature by mouth above 102 F (38.9 C), not controlled by medicine.  You start to shake and have  chills.  You develop a a red rash (hives) or feel itchy.  You develop lightheadedness or feel confused.  You develop back, joint, or muscle pain.  You do not feel hungry (lost appetite).  You feel tired, restless, or nervous.  You develop belly (abdominal) cramps. Document Released: 12/18/2008 Document Revised: 12/14/2011 Document Reviewed: 12/18/2008 Exit[employer] Patient Information 2014 [employer].  

## 2013-09-15 ENCOUNTER — Ambulatory Visit (HOSPITAL_BASED_OUTPATIENT_CLINIC_OR_DEPARTMENT_OTHER): Payer: Medicare Other

## 2013-09-15 VITALS — BP 141/61 | HR 77 | Temp 96.7°F | Resp 18

## 2013-09-15 DIAGNOSIS — Z5111 Encounter for antineoplastic chemotherapy: Secondary | ICD-10-CM

## 2013-09-15 DIAGNOSIS — D46C Myelodysplastic syndrome with isolated del(5q) chromosomal abnormality: Secondary | ICD-10-CM

## 2013-09-15 LAB — TYPE AND SCREEN
ABO/RH(D): A POS
Antibody Screen: NEGATIVE
Unit division: 0

## 2013-09-15 MED ORDER — ONDANSETRON HCL 8 MG PO TABS
ORAL_TABLET | ORAL | Status: AC
  Filled 2013-09-15: qty 1

## 2013-09-15 MED ORDER — AZACITIDINE CHEMO SQ INJECTION
120.0000 mg | Freq: Once | INTRAMUSCULAR | Status: AC
  Administered 2013-09-15: 120 mg via SUBCUTANEOUS
  Filled 2013-09-15: qty 4.8

## 2013-09-15 MED ORDER — ONDANSETRON HCL 8 MG PO TABS
8.0000 mg | ORAL_TABLET | Freq: Once | ORAL | Status: AC
  Administered 2013-09-15: 8 mg via ORAL

## 2013-09-15 NOTE — Patient Instructions (Signed)
[facility] Discharge Instructions for Patients Receiving Chemotherapy  Today you received the following chemotherapy agent Vidaza  To help prevent nausea and vomiting after your treatment, we encourage you to take your nausea medication.   If you develop nausea and vomiting that is not controlled by your nausea medication, call the clinic.   BELOW ARE SYMPTOMS THAT SHOULD BE REPORTED IMMEDIATELY:  *FEVER GREATER THAN 100.5 F  *CHILLS WITH OR WITHOUT FEVER  NAUSEA AND VOMITING THAT IS NOT CONTROLLED WITH YOUR NAUSEA MEDICATION  *UNUSUAL SHORTNESS OF BREATH  *UNUSUAL BRUISING OR BLEEDING  TENDERNESS IN MOUTH AND THROAT WITH OR WITHOUT PRESENCE OF ULCERS  *URINARY PROBLEMS  *BOWEL PROBLEMS  UNUSUAL RASH Items with * indicate a potential emergency and should be followed up as soon as possible.  Feel free to call the clinic you have any questions or concerns. The clinic phone number is [phone].    

## 2013-09-18 ENCOUNTER — Other Ambulatory Visit: Payer: Self-pay | Admitting: Hematology and Oncology

## 2013-09-18 ENCOUNTER — Other Ambulatory Visit (HOSPITAL_BASED_OUTPATIENT_CLINIC_OR_DEPARTMENT_OTHER): Payer: Medicare Other

## 2013-09-18 ENCOUNTER — Ambulatory Visit (HOSPITAL_BASED_OUTPATIENT_CLINIC_OR_DEPARTMENT_OTHER): Payer: Medicare Other

## 2013-09-18 ENCOUNTER — Encounter: Payer: Self-pay | Admitting: *Deleted

## 2013-09-18 ENCOUNTER — Encounter: Payer: Self-pay | Admitting: Hematology and Oncology

## 2013-09-18 ENCOUNTER — Telehealth: Payer: Self-pay | Admitting: Hematology and Oncology

## 2013-09-18 ENCOUNTER — Ambulatory Visit (HOSPITAL_BASED_OUTPATIENT_CLINIC_OR_DEPARTMENT_OTHER): Payer: Medicare Other | Admitting: Hematology and Oncology

## 2013-09-18 VITALS — BP 111/74 | HR 74 | Temp 98.2°F | Resp 17 | Ht 60.0 in | Wt 134.4 lb

## 2013-09-18 VITALS — BP 129/67 | HR 64 | Temp 97.0°F | Resp 18

## 2013-09-18 DIAGNOSIS — D46C Myelodysplastic syndrome with isolated del(5q) chromosomal abnormality: Secondary | ICD-10-CM

## 2013-09-18 DIAGNOSIS — D649 Anemia, unspecified: Secondary | ICD-10-CM

## 2013-09-18 DIAGNOSIS — D72819 Decreased white blood cell count, unspecified: Secondary | ICD-10-CM

## 2013-09-18 LAB — CBC & DIFF AND RETIC
Basophils Absolute: 0.1 10*3/uL (ref 0.0–0.1)
EOS%: 9.8 % — ABNORMAL HIGH (ref 0.0–7.0)
Eosinophils Absolute: 0.4 10*3/uL (ref 0.0–0.5)
HCT: 23.8 % — ABNORMAL LOW (ref 34.8–46.6)
HGB: 7.9 g/dL — ABNORMAL LOW (ref 11.6–15.9)
LYMPH%: 12.1 % — ABNORMAL LOW (ref 14.0–49.7)
MCH: 28.6 pg (ref 25.1–34.0)
MCV: 86.2 fL (ref 79.5–101.0)
MONO%: 2.3 % (ref 0.0–14.0)
NEUT#: 2.8 10*3/uL (ref 1.5–6.5)
NEUT%: 73.2 % (ref 38.4–76.8)
Platelets: 275 10*3/uL (ref 145–400)
RDW: 15.2 % — ABNORMAL HIGH (ref 11.2–14.5)
Retic %: 0.52 % — ABNORMAL LOW (ref 0.70–2.10)

## 2013-09-18 LAB — PREPARE RBC (CROSSMATCH)

## 2013-09-18 LAB — COMPREHENSIVE METABOLIC PANEL (CC13)
AST: 13 U/L (ref 5–34)
Albumin: 3.8 g/dL (ref 3.5–5.0)
Alkaline Phosphatase: 64 U/L (ref 40–150)
BUN: 19.3 mg/dL (ref 7.0–26.0)
Calcium: 10 mg/dL (ref 8.4–10.4)
Chloride: 106 mEq/L (ref 98–109)
Creatinine: 0.9 mg/dL (ref 0.6–1.1)
Glucose: 173 mg/dl — ABNORMAL HIGH (ref 70–140)
Potassium: 4.2 mEq/L (ref 3.5–5.1)
Sodium: 141 mEq/L (ref 136–145)
Total Protein: 7.1 g/dL (ref 6.4–8.3)

## 2013-09-18 LAB — HOLD TUBE, BLOOD BANK

## 2013-09-18 LAB — FERRITIN CHCC: Ferritin: 768 ng/ml — ABNORMAL HIGH (ref 9–269)

## 2013-09-18 MED ORDER — MIDAZOLAM HCL 10 MG/2ML IJ SOLN: 10.0000 mg | Freq: Once | INTRAMUSCULAR | Status: DC

## 2013-09-18 MED ORDER — DIPHENHYDRAMINE HCL 25 MG PO CAPS
ORAL_CAPSULE | ORAL | Status: AC
  Filled 2013-09-18: qty 1

## 2013-09-18 MED ORDER — ACETAMINOPHEN 325 MG PO TABS
ORAL_TABLET | ORAL | Status: AC
  Filled 2013-09-18: qty 2

## 2013-09-18 MED ORDER — ACETAMINOPHEN 325 MG PO TABS
650.0000 mg | ORAL_TABLET | Freq: Once | ORAL | Status: AC
  Administered 2013-09-18: 650 mg via ORAL

## 2013-09-18 MED ORDER — SODIUM CHLORIDE 0.9 % IJ SOLN
10.0000 mL | INTRAMUSCULAR | Status: DC | PRN
  Filled 2013-09-18: qty 10

## 2013-09-18 MED ORDER — SODIUM CHLORIDE 0.9 % IV SOLN: 250.0000 mL | Freq: Once | INTRAVENOUS | Status: DC

## 2013-09-18 MED ORDER — DIPHENHYDRAMINE HCL 25 MG PO CAPS
25.0000 mg | ORAL_CAPSULE | Freq: Once | ORAL | Status: AC
  Administered 2013-09-18: 25 mg via ORAL

## 2013-09-18 NOTE — Progress Notes (Signed)
BMBX scheduled for 12/31 at 8 am.  Written instructions given to pt/dau to arrive at 7 am to Short Stay, NPO after midnight, have a driver.  They verbalized understanding.  Lupita Leash in [employer] notified of date/time for procedure.

## 2013-09-18 NOTE — Progress Notes (Signed)
[facility] OFFICE PROGRESS NOTE  Patient Care Team: Lupita Raider, MD as PCP - General (Family Medicine) Artis Delay, MD as Consulting Physician (Hematology and Oncology)  DIAGNOSIS: Myelodysplastic syndrome with severe anemia SUMMARY OF ONCOLOGIC HISTORY: This is a very pleasant 79 year old lady who become transfusion dependent over the last 3 months. She has received 6 units of blood so far, the first in July, the second one in August and most recently today. She denies any bleeding such as epistaxis, hematuria, or hematochezia. 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 Erythropoietin stimulating agents were stopped. In October 2014, she was started on Revlimid however develop severe allergic reaction to Revlimid. That was discontinued. On 08/07/2013, we started her on Vidaza  INTERVAL HISTORY: Rebekah Cross 79 y.o. female returns for further followup. The patient has become transfusion dependent, requiring blood transfusion on a weekly basis. She denies any recent infection. She has profound fatigue. She denies any recent fever, chills, night sweats or abnormal weight loss  I have reviewed the past medical history, past surgical history, social history and family history with the patient and they are unchanged from previous note.  ALLERGIES:  is allergic to novocain; oxycodone; codeine; methimazole; and revlimid.  MEDICATIONS:  Current Outpatient Prescriptions  Medication Sig Dispense Refill  . acetaminophen (TYLENOL) 500 MG tablet Take 1,000 mg by mouth every 6 (six) hours as needed for fever.      Marland Kitchen aspirin 81 MG tablet Take 81 mg by mouth every morning.       . cholecalciferol (VITAMIN D) 400 UNITS TABS Take 400 Units by mouth every morning.       . COMPRO 25 MG suppository Place 25 mg rectally every 8 (eight) hours as needed for nausea or vomiting.       . hydrOXYzine  (ATARAX/VISTARIL) 25 MG tablet Take 1 tablet (25 mg total) by mouth every 4 (four) hours as needed for itching.  60 tablet  3  . LORazepam (ATIVAN) 0.5 MG tablet Take 0.5 mg by mouth at bedtime.       . metoprolol succinate (TOPROL-XL) 100 MG 24 hr tablet Take 50 mg by mouth daily.       . mometasone (ELOCON) 0.1 % cream Apply 1 application topically daily as needed (psoriasis).       . Multiple Vitamin (MULTIVITAMIN) tablet Take 1 tablet by mouth every morning.       . nisoldipine (SULAR) 25.5 MG 24 hr tablet Take 25.5 mg by mouth every morning.       . olmesartan-hydrochlorothiazide (BENICAR HCT) 40-25 MG per tablet Take 1 tablet by mouth every morning.       . ondansetron (ZOFRAN) 8 MG tablet Take 8 mg by mouth every 8 (eight) hours as needed for nausea or vomiting.       . potassium chloride SA (K-DUR,KLOR-CON) 20 MEQ tablet Take 20 mEq by mouth every morning.       . pravastatin (PRAVACHOL) 40 MG tablet Take 40 mg by mouth at bedtime.       . predniSONE (DELTASONE) 5 MG tablet Take 1 tablet (5 mg total) by mouth daily.  90 tablet  0  . prochlorperazine (COMPAZINE) 10 MG tablet TAKE 1 TABLET BY MOUTH EVERY 6 HOURS AS NEEDED FOR NAUSEA OR VOMITING  30 tablet  2  . promethazine (PHENERGAN) 25 MG tablet Take 25 mg by mouth every  6 (six) hours as needed for nausea or vomiting.      . triamcinolone cream (KENALOG) 0.1 % Apply 1 application topically daily as needed (psoriasis).       Marland Kitchen acitretin (SORIATANE) 25 MG capsule Take 25 mg by mouth every Monday, Wednesday, and Friday. Off on Nov. 5th for one week then resume.       No current facility-administered medications for this visit.   Facility-Administered Medications Ordered in Other Visits  Medication Dose Route Frequency Provider Last Rate Last Dose  . 0.9 %  sodium chloride infusion  250 mL Intravenous Once Artis Delay, MD      . sodium chloride 0.9 % injection 10 mL  10 mL Intracatheter PRN Artis Delay, MD        REVIEW OF SYSTEMS:    Constitutional: Denies fevers, chills or abnormal weight loss Eyes: Denies blurriness of vision Ears, nose, mouth, throat, and face: Denies mucositis or sore throat Respiratory: Denies cough, dyspnea or wheezes Cardiovascular: Denies palpitation, chest discomfort or lower extremity swelling Gastrointestinal:  Denies nausea, heartburn or change in bowel habits Skin: Denies abnormal skin rashes Lymphatics: Denies new lymphadenopathy or easy bruising Neurological:Denies numbness, tingling or new weaknesses Behavioral/Psych: Mood is stable, no new changes  All other systems were reviewed with the patient and are negative.  PHYSICAL EXAMINATION: ECOG PERFORMANCE STATUS: 1 - Symptomatic but completely ambulatory  Filed Vitals:   09/18/13 1256  BP: 111/74  Pulse: 74  Temp: 98.2 F (36.8 C)  Resp: 17   Filed Weights   09/18/13 1256  Weight: 134 lb 6.4 oz (60.963 kg)    GENERAL:alert, no distress and comfortable. She looks thin but not cachectic. SKIN: skin color is pale, texture, turgor are normal, no rashes or significant lesions EYES: normal, Conjunctiva are pale and non-injected, sclera clear OROPHARYNX:no exudate, no erythema and lips, buccal mucosa, and tongue normal  NECK: supple, thyroid normal size, non-tender, without nodularity LYMPH:  no palpable lymphadenopathy in the cervical, axillary or inguinal LUNGS: clear to auscultation and percussion with normal breathing effort HEART: regular rate & rhythm and no murmurs and no lower extremity edema ABDOMEN:abdomen soft, non-tender and normal bowel sounds Musculoskeletal:no cyanosis of digits and no clubbing  NEURO: alert & oriented x 3 with fluent speech, no focal motor/sensory deficits  LABORATORY DATA:  I have reviewed the data as listed    Component Value Date/Time   NA 141 09/18/2013 1235   NA 136 08/27/2013 1212   K 4.2 09/18/2013 1235   K 3.4* 08/27/2013 1212   CL 102 08/27/2013 1212   CO2 24 09/18/2013 1235    CO2 23 08/27/2013 1212   GLUCOSE 173* 09/18/2013 1235   GLUCOSE 118* 08/27/2013 1212   BUN 19.3 09/18/2013 1235   BUN 23 08/27/2013 1212   CREATININE 0.9 09/18/2013 1235   CREATININE 0.78 08/27/2013 1212   CALCIUM 10.0 09/18/2013 1235   CALCIUM 9.2 08/27/2013 1212   PROT 7.1 09/18/2013 1235   PROT 6.6 08/27/2013 1212   ALBUMIN 3.8 09/18/2013 1235   ALBUMIN 3.8 08/27/2013 1212   AST 13 09/18/2013 1235   AST 17 08/27/2013 1212   ALT 20 09/18/2013 1235   ALT 25 08/27/2013 1212   ALKPHOS 64 09/18/2013 1235   ALKPHOS 56 08/27/2013 1212   BILITOT 0.90 09/18/2013 1235   BILITOT 1.9* 08/27/2013 1212   GFRNONAA 74* 08/27/2013 1212   GFRAA 85* 08/27/2013 1212    No results found for this basename: SPEP,  UPEP,  kappa and lambda light chains    Lab Results  Component Value Date   WBC 3.9 09/18/2013   NEUTROABS 2.8 09/18/2013   HGB 7.9* 09/18/2013   HCT 23.8* 09/18/2013   MCV 86.2 09/18/2013   PLT 275 09/18/2013      Chemistry      Component Value Date/Time   NA 141 09/18/2013 1235   NA 136 08/27/2013 1212   K 4.2 09/18/2013 1235   K 3.4* 08/27/2013 1212   CL 102 08/27/2013 1212   CO2 24 09/18/2013 1235   CO2 23 08/27/2013 1212   BUN 19.3 09/18/2013 1235   BUN 23 08/27/2013 1212   CREATININE 0.9 09/18/2013 1235   CREATININE 0.78 08/27/2013 1212      Component Value Date/Time   CALCIUM 10.0 09/18/2013 1235   CALCIUM 9.2 08/27/2013 1212   ALKPHOS 64 09/18/2013 1235   ALKPHOS 56 08/27/2013 1212   AST 13 09/18/2013 1235   AST 17 08/27/2013 1212   ALT 20 09/18/2013 1235   ALT 25 08/27/2013 1212   BILITOT 0.90 09/18/2013 1235   BILITOT 1.9* 08/27/2013 1212     ASSESSMENT & PLAN:  #1 myelodysplastic syndrome The patient continued to be transfusion dependent over the past 2 months. She had received 2 cycles of Vidaza Is not clear to me whether the continue need for blood transfusion is related to her disease or side effects of the chemotherapy. I recommend bone  marrow aspirate and biopsy under sedation. The patient agreed to proceed. I will schedule this to be done in the next few weeks before her cycle 3 of treatment. #2 severe anemia She was transfused last week. This is likely due to recent treatment. The patient denies recent history of bleeding such as epistaxis, hematuria or hematochezia. She is symptomatic from the anemia.  Risks and benefits of blood transfusion were discussed with the patient. The patient will have to come back on a weekly basis to get her blood checked. I recommend transfusion to keep her hemoglobin above 8 g. #3 leukopenia This is due to recent side effects of treatment. We will observe for now.  Orders Placed This Encounter  Procedures  . Biopsy bone marrow    Standing Status: Standing     Number of Occurrences: 1     Standing Expiration Date:   . CBC & Diff and Retic    Standing Status: Standing     Number of Occurrences: 9     Standing Expiration Date: 09/18/2014  . Comprehensive metabolic panel    Standing Status: Standing     Number of Occurrences: 9     Standing Expiration Date: 09/18/2014  . Obtain Consent    Standing Status: Standing     Number of Occurrences: 1     Standing Expiration Date:     Order Specific Question:  Procedure    Answer:  bone marrow aspirate and biopsy    Order Specific Question:  Surgeon    Answer:  Bertis Ruddy    Order Specific Question:  Indication/Reason    Answer:  MDS, assess response to Rx  . Hold Tube, Blood Bank    Standing Status: Standing     Number of Occurrences: 9     Standing Expiration Date: 09/18/2014   All questions were answered. The patient knows to call the clinic with any problems, questions or concerns. No barriers to learning was detected.     [facility], Madiha Bambrick, MD 09/18/2013 3:53 PM

## 2013-09-18 NOTE — Telephone Encounter (Signed)
gv and printed appt sched and avs for pt for DEC °

## 2013-09-19 LAB — TYPE AND SCREEN

## 2013-09-25 ENCOUNTER — Encounter (HOSPITAL_COMMUNITY): Payer: Self-pay | Admitting: Pharmacy Technician

## 2013-09-25 ENCOUNTER — Ambulatory Visit (HOSPITAL_BASED_OUTPATIENT_CLINIC_OR_DEPARTMENT_OTHER): Payer: Medicare Other

## 2013-09-25 ENCOUNTER — Other Ambulatory Visit: Payer: Self-pay | Admitting: Hematology and Oncology

## 2013-09-25 ENCOUNTER — Other Ambulatory Visit (HOSPITAL_BASED_OUTPATIENT_CLINIC_OR_DEPARTMENT_OTHER): Payer: Medicare Other

## 2013-09-25 VITALS — BP 145/36 | HR 69 | Temp 98.1°F | Resp 17

## 2013-09-25 DIAGNOSIS — D630 Anemia in neoplastic disease: Secondary | ICD-10-CM

## 2013-09-25 DIAGNOSIS — D46C Myelodysplastic syndrome with isolated del(5q) chromosomal abnormality: Secondary | ICD-10-CM

## 2013-09-25 LAB — CBC & DIFF AND RETIC
BASO%: 1.7 % (ref 0.0–2.0)
Basophils Absolute: 0.1 10*3/uL (ref 0.0–0.1)
EOS%: 14.5 % — ABNORMAL HIGH (ref 0.0–7.0)
HCT: 23.5 % — ABNORMAL LOW (ref 34.8–46.6)
HGB: 7.9 g/dL — ABNORMAL LOW (ref 11.6–15.9)
LYMPH%: 16.1 % (ref 14.0–49.7)
MCH: 29.2 pg (ref 25.1–34.0)
MCHC: 33.5 g/dL (ref 31.5–36.0)
MCV: 87.2 fL (ref 79.5–101.0)
NEUT%: 66.6 % (ref 38.4–76.8)
Platelets: 150 10*3/uL (ref 145–400)
RDW: 14.1 % (ref 11.2–14.5)
Retic %: 0.58 % — ABNORMAL LOW (ref 0.70–2.10)
Retic Ct Abs: 15.66 10*3/uL — ABNORMAL LOW (ref 33.70–90.70)

## 2013-09-25 LAB — COMPREHENSIVE METABOLIC PANEL (CC13)
ALT: 15 U/L (ref 0–55)
AST: 9 U/L (ref 5–34)
Albumin: 3.4 g/dL — ABNORMAL LOW (ref 3.5–5.0)
Anion Gap: 9 mEq/L (ref 3–11)
BUN: 24.8 mg/dL (ref 7.0–26.0)
CO2: 25 mEq/L (ref 22–29)
Calcium: 9.1 mg/dL (ref 8.4–10.4)
Chloride: 109 mEq/L (ref 98–109)
Creatinine: 0.8 mg/dL (ref 0.6–1.1)
Potassium: 3.3 mEq/L — ABNORMAL LOW (ref 3.5–5.1)
Sodium: 143 mEq/L (ref 136–145)

## 2013-09-25 MED ORDER — HEPARIN SOD (PORK) LOCK FLUSH 100 UNIT/ML IV SOLN
250.0000 [IU] | INTRAVENOUS | Status: DC | PRN
  Filled 2013-09-25: qty 5

## 2013-09-25 MED ORDER — SODIUM CHLORIDE 0.9 % IJ SOLN
10.0000 mL | INTRAMUSCULAR | Status: DC | PRN
  Filled 2013-09-25: qty 10

## 2013-09-25 MED ORDER — DIPHENHYDRAMINE HCL 25 MG PO CAPS
ORAL_CAPSULE | ORAL | Status: AC
  Filled 2013-09-25: qty 1

## 2013-09-25 MED ORDER — DIPHENHYDRAMINE HCL 25 MG PO CAPS
25.0000 mg | ORAL_CAPSULE | Freq: Once | ORAL | Status: AC
  Administered 2013-09-25: 25 mg via ORAL

## 2013-09-25 MED ORDER — ACETAMINOPHEN 325 MG PO TABS
650.0000 mg | ORAL_TABLET | Freq: Once | ORAL | Status: AC
  Administered 2013-09-25: 650 mg via ORAL

## 2013-09-25 MED ORDER — HEPARIN SOD (PORK) LOCK FLUSH 100 UNIT/ML IV SOLN
500.0000 [IU] | Freq: Every day | INTRAVENOUS | Status: DC | PRN
  Filled 2013-09-25: qty 5

## 2013-09-25 MED ORDER — ACETAMINOPHEN 325 MG PO TABS
ORAL_TABLET | ORAL | Status: AC
  Filled 2013-09-25: qty 2

## 2013-09-25 MED ORDER — SODIUM CHLORIDE 0.9 % IJ SOLN
3.0000 mL | INTRAMUSCULAR | Status: DC | PRN
  Filled 2013-09-25: qty 10

## 2013-09-25 MED ORDER — SODIUM CHLORIDE 0.9 % IV SOLN
250.0000 mL | Freq: Once | INTRAVENOUS | Status: AC
  Administered 2013-09-25: 250 mL via INTRAVENOUS

## 2013-09-25 NOTE — Patient Instructions (Signed)
Blood Transfusion  A blood transfusion replaces your blood or some of its parts. Blood is replaced when you have lost blood because of surgery, an accident, or for severe blood conditions like anemia. You can donate blood to be used on yourself if you have a planned surgery. If you lose blood during that surgery, your own blood can be given back to you. Any blood given to you is checked to make sure it matches your blood type. Your temperature, blood pressure, and heart rate (vital signs) will be checked often.  GET HELP RIGHT AWAY IF:   You feel sick to your stomach (nauseous) or throw up (vomit).  You have watery poop (diarrhea).  You have shortness of breath or trouble breathing.  You have blood in your pee (urine) or have dark colored pee.  You have chest pain or tightness.  Your eyes or skin turn yellow (jaundice).  You have a temperature by mouth above 102 F (38.9 C), not controlled by medicine.  You start to shake and have chills.  You develop a a red rash (hives) or feel itchy.  You develop lightheadedness or feel confused.  You develop back, joint, or muscle pain.  You do not feel hungry (lost appetite).  You feel tired, restless, or nervous.  You develop belly (abdominal) cramps. Document Released: 12/18/2008 Document Revised: 12/14/2011 Document Reviewed: 12/18/2008 ExitCare Patient Information 2014 [employer].  

## 2013-09-26 LAB — TYPE AND SCREEN: ABO/RH(D): A POS

## 2013-10-02 ENCOUNTER — Other Ambulatory Visit: Payer: Self-pay | Admitting: Hematology and Oncology

## 2013-10-02 ENCOUNTER — Observation Stay (HOSPITAL_COMMUNITY)
Admission: AD | Admit: 2013-10-02 | Discharge: 2013-10-02 | Disposition: A | Discharge status: Home / Self Care | Payer: Medicare Other | Source: Ambulatory Visit | Attending: Hematology and Oncology | Admitting: Hematology and Oncology

## 2013-10-02 ENCOUNTER — Other Ambulatory Visit (HOSPITAL_BASED_OUTPATIENT_CLINIC_OR_DEPARTMENT_OTHER): Payer: Medicare Other

## 2013-10-02 DIAGNOSIS — D72819 Decreased white blood cell count, unspecified: Secondary | ICD-10-CM | POA: Insufficient documentation

## 2013-10-02 DIAGNOSIS — D649 Anemia, unspecified: Principal | ICD-10-CM | POA: Insufficient documentation

## 2013-10-02 DIAGNOSIS — E7800 Pure hypercholesterolemia, unspecified: Secondary | ICD-10-CM | POA: Insufficient documentation

## 2013-10-02 DIAGNOSIS — E079 Disorder of thyroid, unspecified: Secondary | ICD-10-CM | POA: Insufficient documentation

## 2013-10-02 DIAGNOSIS — D630 Anemia in neoplastic disease: Secondary | ICD-10-CM

## 2013-10-02 DIAGNOSIS — Z79899 Other long term (current) drug therapy: Secondary | ICD-10-CM | POA: Insufficient documentation

## 2013-10-02 DIAGNOSIS — D46C Myelodysplastic syndrome with isolated del(5q) chromosomal abnormality: Secondary | ICD-10-CM | POA: Insufficient documentation

## 2013-10-02 DIAGNOSIS — I10 Essential (primary) hypertension: Secondary | ICD-10-CM | POA: Insufficient documentation

## 2013-10-02 LAB — COMPREHENSIVE METABOLIC PANEL (CC13)
AST: 11 U/L (ref 5–34)
Albumin: 3.2 g/dL — ABNORMAL LOW (ref 3.5–5.0)
Alkaline Phosphatase: 50 U/L (ref 40–150)
Anion Gap: 9 mEq/L (ref 3–11)
BUN: 18.5 mg/dL (ref 7.0–26.0)
Potassium: 3.7 mEq/L (ref 3.5–5.1)
Sodium: 141 mEq/L (ref 136–145)
Total Bilirubin: 1.38 mg/dL — ABNORMAL HIGH (ref 0.20–1.20)
Total Protein: 6.6 g/dL (ref 6.4–8.3)

## 2013-10-02 LAB — CBC & DIFF AND RETIC
Eosinophils Absolute: 0.4 10*3/uL (ref 0.0–0.5)
HGB: 7.2 g/dL — ABNORMAL LOW (ref 11.6–15.9)
MCHC: 33 g/dL (ref 31.5–36.0)
MCV: 85.2 fL (ref 79.5–101.0)
MONO#: 0 10*3/uL — ABNORMAL LOW (ref 0.1–0.9)
MONO%: 0.7 % (ref 0.0–14.0)
NEUT#: 0.5 10*3/uL — CL (ref 1.5–6.5)
NEUT%: 32.1 % — ABNORMAL LOW (ref 38.4–76.8)
Platelets: 259 10*3/uL (ref 145–400)
RBC: 2.56 10*6/uL — ABNORMAL LOW (ref 3.70–5.45)
RDW: 15.3 % — ABNORMAL HIGH (ref 11.2–14.5)
Retic %: 0.9 % (ref 0.70–2.10)
Retic Ct Abs: 23.04 10*3/uL — ABNORMAL LOW (ref 33.70–90.70)
WBC: 1.4 10*3/uL — ABNORMAL LOW (ref 3.9–10.3)

## 2013-10-02 LAB — HOLD TUBE, BLOOD BANK

## 2013-10-02 MED ORDER — HEPARIN SOD (PORK) LOCK FLUSH 100 UNIT/ML IV SOLN: 250.0000 [IU] | INTRAVENOUS | Status: DC | PRN

## 2013-10-02 MED ORDER — CIPROFLOXACIN HCL 500 MG PO TABS: 500.0000 mg | ORAL_TABLET | Freq: Two times a day (BID) | ORAL | Status: DC

## 2013-10-02 MED ORDER — FUROSEMIDE 20 MG PO TABS
20.0000 mg | ORAL_TABLET | Freq: Once | ORAL | Status: AC
  Administered 2013-10-02: 20 mg via ORAL
  Filled 2013-10-02: qty 1

## 2013-10-02 MED ORDER — SODIUM CHLORIDE 0.9 % IJ SOLN: 10.0000 mL | INTRAMUSCULAR | Status: DC | PRN

## 2013-10-02 MED ORDER — SODIUM CHLORIDE 0.9 % IV SOLN
250.0000 mL | Freq: Once | INTRAVENOUS | Status: AC
  Administered 2013-10-02: 250 mL via INTRAVENOUS

## 2013-10-02 MED ORDER — FUROSEMIDE 20 MG PO TABS: 20.0000 mg | ORAL_TABLET | Freq: Once | ORAL | Status: DC

## 2013-10-02 MED ORDER — HEPARIN SOD (PORK) LOCK FLUSH 100 UNIT/ML IV SOLN: 500.0000 [IU] | Freq: Every day | INTRAVENOUS | Status: DC | PRN

## 2013-10-02 MED ORDER — DIPHENHYDRAMINE HCL 25 MG PO CAPS
25.0000 mg | ORAL_CAPSULE | Freq: Once | ORAL | Status: AC
  Administered 2013-10-02: 25 mg via ORAL
  Filled 2013-10-02: qty 1

## 2013-10-02 MED ORDER — ACETAMINOPHEN 325 MG PO TABS
650.0000 mg | ORAL_TABLET | Freq: Once | ORAL | Status: AC
  Administered 2013-10-02: 650 mg via ORAL
  Filled 2013-10-02: qty 2

## 2013-10-02 MED ORDER — SODIUM CHLORIDE 0.9 % IJ SOLN: 3.0000 mL | INTRAMUSCULAR | Status: DC | PRN

## 2013-10-02 NOTE — Progress Notes (Signed)
Pt tolerate blood well, without any complication. Discharged summary and education given to pt and family. Pt and family states they understand teaching. Pt discharged home.

## 2013-10-02 NOTE — H&P (Signed)
[facility] CONSULT NOTE  Patient Care Team: Lupita Raider, MD as PCP - General (Family Medicine) Artis Delay, MD as Consulting Physician (Hematology and Oncology)  CHIEF COMPLAINTS/PURPOSE OF CONSULTATION:  The patient is being admitted to receive 2 units of blood transfusion due to severe anemia.   HISTORY OF PRESENTING ILLNESS:  Rebekah Cross 79 y.o. female is here because of severe anemia. She is complaining of some dizziness yesterday and fatigue. Denies any bleeding.She denies any recent fever, chills, night sweats or abnormal weight loss   MEDICAL HISTORY:  Past Medical History  Diagnosis Date  . Anemia, unspecified   . Hypertension   . Thyroid disease   . Hypercholesteremia   . MDS (myelodysplastic syndrome) with 5q deletion 07/11/2013    SURGICAL HISTORY: Past Surgical History  Procedure Laterality Date  . Abdominal hysterectomy    . Back surgery    . Arthroscopy right knee    . Arthroscopy left knee      SOCIAL HISTORY: History   Social History  . Marital Status: Widowed    Spouse Name: N/A    Number of Children: 4  . Years of Education: N/A   Occupational History  . RETIRED    Social History Main Topics  . Smoking status: Never Smoker   . Smokeless tobacco: Never Used  . Alcohol Use: No  . Drug Use: No  . Sexual Activity: Not on file   Other Topics Concern  . Not on file   Social History Narrative  . No narrative on file    FAMILY HISTORY: Family History  Problem Relation Age of Onset  . Colon cancer Daughter   . Heart disease Maternal Grandmother   . Heart disease Paternal Grandfather     ALLERGIES:  is allergic to novocain; oxycodone; codeine; methimazole; and revlimid.  MEDICATIONS:  Current Facility-Administered Medications  Medication Dose Route Frequency Provider Last Rate Last Dose  . furosemide (LASIX) tablet 20 mg  20 mg Oral Once Artis Delay, MD      . heparin lock flush 100 unit/mL  500 Units Intracatheter  Daily PRN Artis Delay, MD      . heparin lock flush 100 unit/mL  250 Units Intracatheter PRN Artis Delay, MD      . sodium chloride 0.9 % injection 10 mL  10 mL Intracatheter PRN Artis Delay, MD      . sodium chloride 0.9 % injection 3 mL  3 mL Intracatheter PRN Artis Delay, MD       Facility-Administered Medications Ordered in Other Encounters  Medication Dose Route Frequency Provider Last Rate Last Dose  . furosemide (LASIX) tablet 20 mg  20 mg Oral Once Artis Delay, MD        REVIEW OF SYSTEMS:   Constitutional: Denies fevers, chills or abnormal night sweats Eyes: Denies blurriness of vision, double vision or watery eyes Ears, nose, mouth, throat, and face: Denies mucositis or sore throat Respiratory: Denies cough, dyspnea or wheezes Cardiovascular: Denies palpitation, chest discomfort or lower extremity swelling Gastrointestinal:  Denies nausea, heartburn or change in bowel habits Skin: Denies abnormal skin rashes Lymphatics: Denies new lymphadenopathy or easy bruising Neurological:Denies numbness, tingling or new weaknesses Behavioral/Psych: Mood is stable, no new changes  All other systems were reviewed with the patient and are negative.  PHYSICAL EXAMINATION: ECOG PERFORMANCE STATUS: 1 - Symptomatic but completely ambulatory  Filed Vitals:   10/02/13 1517  BP: 122/38  Pulse: 74  Temp: 97.8 F (36.6 C)  Resp:  16   Filed Weights   10/02/13 1005  Weight: 130 lb (58.968 kg)    GENERAL:alert, no distress and comfortable. She looks thin and pale  SKIN: skin color is pale, texture, turgor are normal, no rashes or significant lesions EYES: normal, conjunctiva are pink and non-injected, sclera clear OROPHARYNX:no exudate, no erythema and lips, buccal mucosa, and tongue normal  NECK: supple, thyroid normal size, non-tender, without nodularity LYMPH:  no palpable lymphadenopathy in the cervical, axillary or inguinal LUNGS: clear to auscultation and percussion with normal breathing  effort HEART: regular rate & rhythm and no murmurs and no lower extremity edema ABDOMEN:abdomen soft, non-tender and normal bowel sounds Musculoskeletal:no cyanosis of digits and no clubbing  PSYCH: alert & oriented x 3 with fluent speech NEURO: no focal motor/sensory deficits  LABORATORY DATA:  I have reviewed the data as listed Lab Results  Component Value Date   WBC 1.4* 10/02/2013   HGB 7.2* 10/02/2013   HCT 21.8* 10/02/2013   MCV 85.2 10/02/2013   PLT 259 Large & giant platelets 10/02/2013   Lab Results  Component Value Date   NA 141 10/02/2013   K 3.7 10/02/2013   CL 102 08/27/2013   CO2 26 10/02/2013   ASSESSMENT:  Myelodysplastic syndrome  PLAN:  #1 myelodysplastic syndrome  The patient continued to be transfusion dependent over the past 2 months.  She had received 2 cycles of Vidaza  We'll proceed with bone marrow biopsy in 2 days time #2 severe anemia  She was transfused last week. This is likely due to recent treatment. The patient denies recent history of bleeding such as epistaxis, hematuria or hematochezia. She is symptomatic from the anemia.  Risks and benefits of blood transfusion were discussed with the patient.  The patient will have to come back on a weekly basis to get her blood checked.  I recommend transfusion to keep her hemoglobin above 8 g.  Due to her age, I recommend Lasix after one unit of blood to reduce risk of fluid overload #3 leukopenia  This is due to recent side effects of treatment. We will observe for now. I will prescribe ciprofloxacin to her pharmacy so that she have her prescription antibiotics for hang onto and if she developed neutropenic fever, the patient can take the antibiotics right away and call me  Orders Placed This Encounter  Procedures  . Care order/instruction    Transfuse Parameters    Standing Status: Future     Number of Occurrences:      Standing Expiration Date: 10/04/2013  . Discharge patient    Discharge  after blood transfusion is complete    Standing Status: Standing     Number of Occurrences: 1     Standing Expiration Date:     All questions were answered. The patient knows to call the clinic with any problems, questions or concerns.   [name], MD @T @ 4:41 PM

## 2013-10-03 ENCOUNTER — Telehealth: Payer: Self-pay | Admitting: Hematology and Oncology

## 2013-10-03 LAB — TYPE AND SCREEN
ABO/RH(D): A POS
Antibody Screen: NEGATIVE
Unit division: 0

## 2013-10-03 NOTE — Telephone Encounter (Signed)
Pt has been admitted for her blood transfusion per michelle wheat.

## 2013-10-03 NOTE — Discharge Summary (Signed)
The patient received blood transfusion today. She also received one dose of Lasix after 1 unit of blood. There were no reported complication from blood transfusion and the patient was discharged home.

## 2013-10-04 ENCOUNTER — Ambulatory Visit (HOSPITAL_COMMUNITY)
Admission: RE | Admit: 2013-10-04 | Discharge: 2013-10-04 | Disposition: A | Discharge status: Home / Self Care | Payer: Medicare Other | Source: Ambulatory Visit | Attending: Hematology and Oncology | Admitting: Hematology and Oncology

## 2013-10-04 ENCOUNTER — Encounter (HOSPITAL_COMMUNITY): Payer: Self-pay

## 2013-10-04 VITALS — BP 152/49 | HR 74 | Temp 98.5°F | Resp 22 | Ht 62.0 in | Wt 130.0 lb

## 2013-10-04 DIAGNOSIS — D469 Myelodysplastic syndrome, unspecified: Secondary | ICD-10-CM | POA: Insufficient documentation

## 2013-10-04 DIAGNOSIS — D72819 Decreased white blood cell count, unspecified: Secondary | ICD-10-CM | POA: Insufficient documentation

## 2013-10-04 DIAGNOSIS — D649 Anemia, unspecified: Secondary | ICD-10-CM | POA: Insufficient documentation

## 2013-10-04 DIAGNOSIS — D46C Myelodysplastic syndrome with isolated del(5q) chromosomal abnormality: Secondary | ICD-10-CM

## 2013-10-04 LAB — BONE MARROW EXAM

## 2013-10-04 LAB — CBC
HCT: 28.4 % — ABNORMAL LOW (ref 36.0–46.0)
Hemoglobin: 9.6 g/dL — ABNORMAL LOW (ref 12.0–15.0)
MCH: 29.4 pg (ref 26.0–34.0)
RBC: 3.26 MIL/uL — ABNORMAL LOW (ref 3.87–5.11)
RDW: 15.1 % (ref 11.5–15.5)
WBC: 1.4 10*3/uL — CL (ref 4.0–10.5)

## 2013-10-04 LAB — SAMPLE TO BLOOD BANK

## 2013-10-04 MED ORDER — MIDAZOLAM HCL 10 MG/2ML IJ SOLN: 10.0000 mg | Freq: Once | INTRAMUSCULAR | Status: DC

## 2013-10-04 MED ORDER — MORPHINE SULFATE 10 MG/ML IJ SOLN
INTRAMUSCULAR | Status: DC | PRN
  Administered 2013-10-04 (×2): 1 mg via INTRAVENOUS

## 2013-10-04 MED ORDER — MIDAZOLAM HCL 2 MG/2ML IJ SOLN
INTRAMUSCULAR | Status: DC | PRN
  Administered 2013-10-04 (×4): 1 mg via INTRAVENOUS

## 2013-10-04 MED ORDER — SODIUM CHLORIDE 0.9 % IV SOLN
Freq: Once | INTRAVENOUS | Status: AC
  Administered 2013-10-04: 08:00:00 via INTRAVENOUS

## 2013-10-04 MED ORDER — MIDAZOLAM HCL 10 MG/2ML IJ SOLN
10.0000 mg | Freq: Once | INTRAMUSCULAR | Status: DC
  Filled 2013-10-04: qty 2

## 2013-10-04 MED ORDER — MORPHINE SULFATE 10 MG/ML IJ SOLN
10.0000 mg | Freq: Once | INTRAMUSCULAR | Status: DC
  Filled 2013-10-04: qty 1

## 2013-10-04 NOTE — Sedation Documentation (Signed)
Medication dose calculated and verified for: VERSED 4mg  IV and MORPHINE 2mg  IV

## 2013-10-04 NOTE — ED Notes (Signed)
Patient is resting comfortably. 

## 2013-10-04 NOTE — ED Notes (Signed)
Patient denies pain and is resting comfortably.  

## 2013-10-04 NOTE — Procedures (Signed)
Brief examination was performed. ENT: adequate airway clearance Heart: regular rate and rhythm.No Murmurs Lungs: clear to auscultation, no wheezes, normal respiratory effort  Bone Marrow Biopsy and Aspiration Procedure Note   Informed consent was obtained and potential risks including bleeding, infection and pain were reviewed with the patient. I verified that the patient has been fasting since midnight.  The patient's name, date of birth, identification, consent and allergies were verified prior to the start of procedure and time out was performed.  A total of 4 mg of IV Versed and 2 mg of IV morphine were given.  The left posterior iliac crest was chosen as the site of biopsy.  The skin was prepped with Betadine solution.   5 cc of 2% lidocaine was used to provide local anaesthesia.   10 cc of bone marrow aspirate was obtained followed by 1 inch biopsy.   The procedure was tolerated well and there were no complications.  The patient was stable at the end of the procedure.  Specimens sent for flow cytometry, cytogenetics and additional studies.  

## 2013-10-04 NOTE — ED Notes (Addendum)
Procedure completed and bandaid to right post iliac Pt placed suspine for pressure to procedure site

## 2013-10-04 NOTE — ED Notes (Signed)
bandaid has small spot but no oozing

## 2013-10-04 NOTE — ED Notes (Signed)
Family updated as to patient's status.

## 2013-10-04 NOTE — ED Notes (Signed)
bandaid right post iliac area CDI 

## 2013-10-04 NOTE — ED Notes (Signed)
Ambulated in room and then to BR with minimal assist . Bandaid remains unchanged

## 2013-10-04 NOTE — Progress Notes (Signed)
CRITICAL VALUE ALERT  Critical value received: WBC 1.4 Date of notification: 10/04/13  Time of notification: 0755  Critical value read back:yes  Nurse who received alert:  Juliann Pares   and reported to Lamont Snowball RN MD notified (1st page):  Awaiting her arrival for Bone Marrow Biopsy to report lab results  Time of first page: currently  MD notified (2nd page):  Time of second page:  Responding MD:  Dr Bertis Ruddy  Time MD responded:awaiting her arrival

## 2013-10-08 ENCOUNTER — Other Ambulatory Visit: Payer: Self-pay | Admitting: Hematology and Oncology

## 2013-10-09 ENCOUNTER — Telehealth: Payer: Self-pay | Admitting: Hematology and Oncology

## 2013-10-09 ENCOUNTER — Ambulatory Visit: Payer: Medicare Other | Admitting: Hematology and Oncology

## 2013-10-09 ENCOUNTER — Other Ambulatory Visit: Payer: Medicare Other

## 2013-10-09 NOTE — Telephone Encounter (Signed)
per MD chenge appt to [id number].Marland KitchenMarland Kitchenper MD pt and daughtter aware

## 2013-10-11 ENCOUNTER — Telehealth: Payer: Self-pay | Admitting: *Deleted

## 2013-10-11 ENCOUNTER — Other Ambulatory Visit: Payer: Self-pay | Admitting: Hematology and Oncology

## 2013-10-11 ENCOUNTER — Ambulatory Visit (HOSPITAL_BASED_OUTPATIENT_CLINIC_OR_DEPARTMENT_OTHER): Payer: Medicare Other | Admitting: Hematology and Oncology

## 2013-10-11 ENCOUNTER — Other Ambulatory Visit (HOSPITAL_BASED_OUTPATIENT_CLINIC_OR_DEPARTMENT_OTHER): Payer: Medicare Other

## 2013-10-11 ENCOUNTER — Telehealth: Payer: Self-pay | Admitting: Hematology and Oncology

## 2013-10-11 ENCOUNTER — Encounter (INDEPENDENT_AMBULATORY_CARE_PROVIDER_SITE_OTHER): Payer: Self-pay

## 2013-10-11 ENCOUNTER — Encounter: Payer: Self-pay | Admitting: Hematology and Oncology

## 2013-10-11 VITALS — BP 141/41 | HR 72 | Temp 97.7°F | Resp 18 | Ht 62.0 in | Wt 134.5 lb

## 2013-10-11 DIAGNOSIS — D649 Anemia, unspecified: Secondary | ICD-10-CM

## 2013-10-11 DIAGNOSIS — D46C Myelodysplastic syndrome with isolated del(5q) chromosomal abnormality: Secondary | ICD-10-CM

## 2013-10-11 DIAGNOSIS — D72819 Decreased white blood cell count, unspecified: Secondary | ICD-10-CM

## 2013-10-11 LAB — COMPREHENSIVE METABOLIC PANEL (CC13)
ALBUMIN: 3.4 g/dL — AB (ref 3.5–5.0)
ALT: 18 U/L (ref 0–55)
AST: 12 U/L (ref 5–34)
Alkaline Phosphatase: 54 U/L (ref 40–150)
Anion Gap: 9 mEq/L (ref 3–11)
BILIRUBIN TOTAL: 0.64 mg/dL (ref 0.20–1.20)
BUN: 30.2 mg/dL — ABNORMAL HIGH (ref 7.0–26.0)
CO2: 25 meq/L (ref 22–29)
Calcium: 9.3 mg/dL (ref 8.4–10.4)
Chloride: 110 mEq/L — ABNORMAL HIGH (ref 98–109)
Creatinine: 0.8 mg/dL (ref 0.6–1.1)
Glucose: 102 mg/dl (ref 70–140)
POTASSIUM: 3.9 meq/L (ref 3.5–5.1)
Sodium: 144 mEq/L (ref 136–145)
Total Protein: 6.6 g/dL (ref 6.4–8.3)

## 2013-10-11 LAB — CBC & DIFF AND RETIC
BASO%: 4.1 % — ABNORMAL HIGH (ref 0.0–2.0)
BASOS ABS: 0.1 10*3/uL (ref 0.0–0.1)
EOS ABS: 0.7 10*3/uL — AB (ref 0.0–0.5)
EOS%: 27.3 % — ABNORMAL HIGH (ref 0.0–7.0)
HEMATOCRIT: 26.2 % — AB (ref 34.8–46.6)
HGB: 8.8 g/dL — ABNORMAL LOW (ref 11.6–15.9)
IMMATURE RETIC FRACT: 9.3 % (ref 1.60–10.00)
LYMPH%: 17.2 % (ref 14.0–49.7)
MCH: 29.1 pg (ref 25.1–34.0)
MCHC: 33.6 g/dL (ref 31.5–36.0)
MCV: 86.8 fL (ref 79.5–101.0)
MONO#: 0.1 10*3/uL (ref 0.1–0.9)
MONO%: 2.2 % (ref 0.0–14.0)
NEUT%: 49.2 % (ref 38.4–76.8)
NEUTROS ABS: 1.3 10*3/uL — AB (ref 1.5–6.5)
NRBC: 0 % (ref 0–0)
PLATELETS: 514 10*3/uL — AB (ref 145–400)
RBC: 3.02 10*6/uL — AB (ref 3.70–5.45)
RDW: 15.7 % — ABNORMAL HIGH (ref 11.2–14.5)
Retic %: 1.05 % (ref 0.70–2.10)
Retic Ct Abs: 31.71 10*3/uL — ABNORMAL LOW (ref 33.70–90.70)
WBC: 2.7 10*3/uL — ABNORMAL LOW (ref 3.9–10.3)
lymph#: 0.5 10*3/uL — ABNORMAL LOW (ref 0.9–3.3)

## 2013-10-11 LAB — HOLD TUBE, BLOOD BANK

## 2013-10-11 MED ORDER — PREDNISONE 10 MG PO TABS: 10.0000 mg | ORAL_TABLET | Freq: Every day | ORAL | Status: DC

## 2013-10-11 NOTE — Telephone Encounter (Signed)
Dau called to discuss referral to [facility] by Dr. Alvy Bimler.  She states pt's brothers would like referral to [facility] as it will be more convenient.  Explained that we can refer to [facility] for second opinion, but referring to a [facility] such as [place], [place] or [place] is preferred.   Dau verbalized understanding and agrees to referral to [facility].

## 2013-10-11 NOTE — Progress Notes (Signed)
[place] OFFICE PROGRESS NOTE  Patient Care Team: Mayra Neer, MD as PCP - General (Family Medicine) Heath Lark, MD as Consulting Physician (Hematology and Oncology)  DIAGNOSIS: Myelodysplastic syndrome/myeloproliferative disorder with severe anemia  SUMMARY OF ONCOLOGIC HISTORY: This is a very pleasant 80 year old lady who become transfusion dependent over the last 3 months. She has received 6 units of blood so far, the first in July, the second one in August and most recently today. She denies any bleeding such as epistaxis, hematuria, or hematochezia. 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 Erythropoietin stimulating agents were stopped when her erythropoietin level came back over 500 In October 2014, she was started on Revlimid however develop severe allergic reaction to Revlimid. That was discontinued. On 08/07/2013, we started her on Vidaza for 2 cycles. On 10/04/2013, repeat a bone marrow aspirate and biopsy  INTERVAL HISTORY: Rebekah Cross 80 y.o. female returns for further followup. She is transfusion dependent requiring blood transfusion almost on a weekly basis. She complained of profound fatigue. She remained on prednisone 5 mg once a day of for appetite. She denies any recent fever, chills, night sweats or abnormal weight loss The patient denies any recent signs or symptoms of bleeding such as spontaneous epistaxis, hematuria or hematochezia.  I have reviewed the past medical history, past surgical history, social history and family history with the patient and they are unchanged from previous note.  ALLERGIES:  is allergic to novocain; oxycodone; codeine; methimazole; and revlimid.  MEDICATIONS:  Current Outpatient Prescriptions  Medication Sig Dispense Refill  . acetaminophen (TYLENOL) 500 MG tablet Take 1,000 mg by mouth every 6 (six) hours as needed  for fever.      Marland Kitchen aspirin 81 MG tablet Take 81 mg by mouth every morning.       . cholecalciferol (VITAMIN D) 400 UNITS TABS Take 400 Units by mouth every morning.       . hydrOXYzine (ATARAX/VISTARIL) 25 MG tablet Take 1 tablet (25 mg total) by mouth every 4 (four) hours as needed for itching.  60 tablet  3  . LORazepam (ATIVAN) 0.5 MG tablet Take 0.5 mg by mouth at bedtime.       . metoprolol succinate (TOPROL-XL) 100 MG 24 hr tablet Take 50 mg by mouth daily.       . mometasone (ELOCON) 0.1 % cream Apply 1 application topically daily as needed (psoriasis).       . Multiple Vitamin (MULTIVITAMIN) tablet Take 1 tablet by mouth every morning.       . nisoldipine (SULAR) 25.5 MG 24 hr tablet Take 25.5 mg by mouth every morning.       . olmesartan-hydrochlorothiazide (BENICAR HCT) 40-25 MG per tablet Take 1 tablet by mouth every morning.       . ondansetron (ZOFRAN) 8 MG tablet Take 8 mg by mouth every 8 (eight) hours as needed for nausea or vomiting.       . potassium chloride SA (K-DUR,KLOR-CON) 20 MEQ tablet Take 20 mEq by mouth every morning.       . pravastatin (PRAVACHOL) 40 MG tablet Take 40 mg by mouth at bedtime.       . predniSONE (DELTASONE) 5 MG tablet Take 1 tablet (5 mg total) by mouth daily.  90 tablet  0  . prochlorperazine (COMPAZINE) 10 MG tablet TAKE 1 TABLET BY MOUTH EVERY 6 HOURS AS NEEDED FOR  NAUSEA OR VOMITING  30 tablet  2  . triamcinolone cream (KENALOG) 0.1 % Apply 1 application topically daily as needed (psoriasis).       . ciprofloxacin (CIPRO) 500 MG tablet Take 1 tablet (500 mg total) by mouth 2 (two) times daily.  14 tablet  0  . predniSONE (DELTASONE) 10 MG tablet Take 1 tablet (10 mg total) by mouth daily with breakfast.  90 tablet  3   No current facility-administered medications for this visit.    REVIEW OF SYSTEMS:   Constitutional: Denies fevers, chills or abnormal weight loss All other systems were reviewed with the patient and are negative.  PHYSICAL  EXAMINATION: ECOG PERFORMANCE STATUS: 1 - Symptomatic but completely ambulatory  Filed Vitals:   10/11/13 0840  BP: 141/41  Pulse: 72  Temp: 97.7 F (36.5 C)  Resp: 18   Filed Weights   10/11/13 0840  Weight: 134 lb 8 oz (61.009 kg)    GENERAL:alert, no distress and comfortable SKIN: skin color, texture, turgor are normal, no rashes or significant lesions Musculoskeletal:no cyanosis of digits and no clubbing  NEURO: alert & oriented x 3 with fluent speech, no focal motor/sensory deficits  LABORATORY DATA:  I have reviewed the data as listed    Component Value Date/Time   NA 144 10/11/2013 0825   NA 136 08/27/2013 1212   K 3.9 10/11/2013 0825   K 3.4* 08/27/2013 1212   CL 102 08/27/2013 1212   CO2 25 10/11/2013 0825   CO2 23 08/27/2013 1212   GLUCOSE 102 10/11/2013 0825   GLUCOSE 118* 08/27/2013 1212   BUN 30.2* 10/11/2013 0825   BUN 23 08/27/2013 1212   CREATININE 0.8 10/11/2013 0825   CREATININE 0.78 08/27/2013 1212   CALCIUM 9.3 10/11/2013 0825   CALCIUM 9.2 08/27/2013 1212   PROT 6.6 10/11/2013 0825   PROT 6.6 08/27/2013 1212   ALBUMIN 3.4* 10/11/2013 0825   ALBUMIN 3.8 08/27/2013 1212   AST 12 10/11/2013 0825   AST 17 08/27/2013 1212   ALT 18 10/11/2013 0825   ALT 25 08/27/2013 1212   ALKPHOS 54 10/11/2013 0825   ALKPHOS 56 08/27/2013 1212   BILITOT 0.64 10/11/2013 0825   BILITOT 1.9* 08/27/2013 1212   GFRNONAA 74* 08/27/2013 1212   GFRAA 85* 08/27/2013 1212    No results found for this basename: SPEP,  UPEP,   kappa and lambda light chains    Lab Results  Component Value Date   WBC 2.7* 10/11/2013   NEUTROABS 1.3* 10/11/2013   HGB 8.8* 10/11/2013   HCT 26.2* 10/11/2013   MCV 86.8 10/11/2013   PLT 514* 10/11/2013      Chemistry      Component Value Date/Time   NA 144 10/11/2013 0825   NA 136 08/27/2013 1212   K 3.9 10/11/2013 0825   K 3.4* 08/27/2013 1212   CL 102 08/27/2013 1212   CO2 25 10/11/2013 0825   CO2 23 08/27/2013 1212   BUN 30.2* 10/11/2013 0825   BUN 23 08/27/2013  1212   CREATININE 0.8 10/11/2013 0825   CREATININE 0.78 08/27/2013 1212      Component Value Date/Time   CALCIUM 9.3 10/11/2013 0825   CALCIUM 9.2 08/27/2013 1212   ALKPHOS 54 10/11/2013 0825   ALKPHOS 56 08/27/2013 1212   AST 12 10/11/2013 0825   AST 17 08/27/2013 1212   ALT 18 10/11/2013 0825   ALT 25 08/27/2013 1212   BILITOT 0.64 10/11/2013 0825   BILITOT 1.9* 08/27/2013  1212      I. reviewed her most recent bone marrow biopsy report with the patient and her daughter  ASSESSMENT & PLAN:  #1 myelodysplastic syndrome/myeloproliferative disorder The patient continued to be transfusion dependent over the past 2 months. She had received 2 cycles of Vidaza. I will discontinue her treatment. Her bone marrow aspirate and biopsy show no response to treatment I will review her case in the next hematology tumor board on 10/24/2013 I recommend second opinion at [facility] I will order an additional testing when she come in for blood work next week to rule out myeloproliferative disorder using specific test for JAK 2 mutation, BCR/ABL, PDGFRA and PDGFRB. I suspect there may be a component of myeloproliferative disorder and if any of those test came back positive, it may represent additional targets for treatment. In the meantime we'll continue supportive care until her visit in 2 weeks #2 severe anemia She was transfused last week. This is likely due to recent treatment. The patient denies recent history of bleeding such as epistaxis, hematuria or hematochezia. She is not symptomatic from the anemia today  I recommend transfusion to keep her hemoglobin above 8 g. I recommend increasing her prednisone to 10 mg in the meantime. Sometimes low dose prednisone may help with anemia temporally The patient will return to to get her blood checked on a weekly basis and transfuse if needed. #3 leukopenia This is due to recent side effects of treatment. We will observe for now.  Orders Placed This Encounter   Procedures  . [employer]ther [employer] Test    Standing Status: Future     Number of Occurrences:      Standing Expiration Date: 10/11/2014    Order Specific Question:  Test Name:    Answer:  PDGFRA and PDGFRB  . [id number]    Standing Status: Future     Number of Occurrences:      Standing Expiration Date: 10/11/2014  . bcr/abl    Standing Status: Future     Number of Occurrences:      Standing Expiration Date: 10/11/2014   All questions were answered. The patient knows to call the clinic with any problems, questions or concerns. No barriers to learning was detected.    Ward, [employer], MD 10/11/2013 12:16 PM

## 2013-10-11 NOTE — Telephone Encounter (Signed)
appts made per 1/7 POF AVS and CAL given shh °

## 2013-10-13 ENCOUNTER — Telehealth: Payer: Self-pay | Admitting: Hematology and Oncology

## 2013-10-13 LAB — TISSUE HYBRIDIZATION (BONE MARROW)-NCBH

## 2013-10-13 LAB — CHROMOSOME ANALYSIS, BONE MARROW

## 2013-10-13 NOTE — Telephone Encounter (Signed)
Faxed pt medical records to [facility] for a second opinion. After review of records they will call pt with appt.  Pt's dtr is aware

## 2013-10-16 ENCOUNTER — Other Ambulatory Visit: Payer: Medicare Other

## 2013-10-16 ENCOUNTER — Telehealth: Payer: Self-pay | Admitting: Hematology and Oncology

## 2013-10-16 NOTE — Telephone Encounter (Signed)
Pt appt @ [facility] with Dr. Janene Madeira is 10/27/12@12 :44. Medical records faxed. Pt is aware

## 2013-10-18 ENCOUNTER — Other Ambulatory Visit: Payer: Medicare Other

## 2013-10-18 ENCOUNTER — Ambulatory Visit (HOSPITAL_BASED_OUTPATIENT_CLINIC_OR_DEPARTMENT_OTHER): Payer: Medicare Other

## 2013-10-18 DIAGNOSIS — D46C Myelodysplastic syndrome with isolated del(5q) chromosomal abnormality: Secondary | ICD-10-CM

## 2013-10-18 DIAGNOSIS — D649 Anemia, unspecified: Secondary | ICD-10-CM

## 2013-10-18 LAB — CBC & DIFF AND RETIC
BASO%: 1.9 % (ref 0.0–2.0)
BASOS ABS: 0.1 10*3/uL (ref 0.0–0.1)
EOS%: 5.8 % (ref 0.0–7.0)
Eosinophils Absolute: 0.2 10*3/uL (ref 0.0–0.5)
HEMATOCRIT: 26.2 % — AB (ref 34.8–46.6)
HEMOGLOBIN: 8.7 g/dL — AB (ref 11.6–15.9)
IMMATURE RETIC FRACT: 7.4 % (ref 1.60–10.00)
LYMPH%: 10.9 % — ABNORMAL LOW (ref 14.0–49.7)
MCH: 28.9 pg (ref 25.1–34.0)
MCHC: 33.2 g/dL (ref 31.5–36.0)
MCV: 87 fL (ref 79.5–101.0)
MONO#: 0.1 10*3/uL (ref 0.1–0.9)
MONO%: 2.7 % (ref 0.0–14.0)
NEUT#: 3.3 10*3/uL (ref 1.5–6.5)
NEUT%: 78.7 % — AB (ref 38.4–76.8)
Platelets: 415 10*3/uL — ABNORMAL HIGH (ref 145–400)
RBC: 3.01 10*6/uL — ABNORMAL LOW (ref 3.70–5.45)
RDW: 15.8 % — ABNORMAL HIGH (ref 11.2–14.5)
Retic %: 1.03 % (ref 0.70–2.10)
Retic Ct Abs: 31 10*3/uL — ABNORMAL LOW (ref 33.70–90.70)
WBC: 4.1 10*3/uL (ref 3.9–10.3)
lymph#: 0.5 10*3/uL — ABNORMAL LOW (ref 0.9–3.3)
nRBC: 0 % (ref 0–0)

## 2013-10-18 LAB — COMPREHENSIVE METABOLIC PANEL (CC13)
ALBUMIN: 3.8 g/dL (ref 3.5–5.0)
ALT: 20 U/L (ref 0–55)
AST: 14 U/L (ref 5–34)
Alkaline Phosphatase: 59 U/L (ref 40–150)
Anion Gap: 11 mEq/L (ref 3–11)
BUN: 21.7 mg/dL (ref 7.0–26.0)
CALCIUM: 9.7 mg/dL (ref 8.4–10.4)
CHLORIDE: 108 meq/L (ref 98–109)
CO2: 27 mEq/L (ref 22–29)
Creatinine: 0.8 mg/dL (ref 0.6–1.1)
Glucose: 127 mg/dl (ref 70–140)
POTASSIUM: 3.8 meq/L (ref 3.5–5.1)
SODIUM: 145 meq/L (ref 136–145)
Total Bilirubin: 0.63 mg/dL (ref 0.20–1.20)
Total Protein: 7.1 g/dL (ref 6.4–8.3)

## 2013-10-18 LAB — TECHNOLOGIST REVIEW

## 2013-10-18 LAB — HOLD TUBE, BLOOD BANK

## 2013-10-23 ENCOUNTER — Other Ambulatory Visit: Payer: Self-pay | Admitting: Hematology and Oncology

## 2013-10-23 ENCOUNTER — Other Ambulatory Visit: Payer: Medicare Other

## 2013-10-25 ENCOUNTER — Encounter (INDEPENDENT_AMBULATORY_CARE_PROVIDER_SITE_OTHER): Payer: Self-pay

## 2013-10-25 ENCOUNTER — Ambulatory Visit (HOSPITAL_BASED_OUTPATIENT_CLINIC_OR_DEPARTMENT_OTHER): Payer: Medicare Other | Admitting: Hematology and Oncology

## 2013-10-25 ENCOUNTER — Other Ambulatory Visit: Payer: Medicare Other

## 2013-10-25 ENCOUNTER — Ambulatory Visit (HOSPITAL_COMMUNITY)
Admission: RE | Admit: 2013-10-25 | Discharge: 2013-10-25 | Disposition: A | Discharge status: Home / Self Care | Payer: Medicare Other | Source: Ambulatory Visit | Attending: Hematology and Oncology | Admitting: Hematology and Oncology

## 2013-10-25 ENCOUNTER — Other Ambulatory Visit (HOSPITAL_BASED_OUTPATIENT_CLINIC_OR_DEPARTMENT_OTHER): Payer: Medicare Other

## 2013-10-25 ENCOUNTER — Ambulatory Visit (HOSPITAL_BASED_OUTPATIENT_CLINIC_OR_DEPARTMENT_OTHER): Payer: Medicare Other

## 2013-10-25 VITALS — BP 151/40 | HR 71 | Temp 97.3°F | Resp 18 | Ht 62.0 in | Wt 136.2 lb

## 2013-10-25 VITALS — BP 151/65 | HR 69 | Temp 97.8°F | Resp 20

## 2013-10-25 DIAGNOSIS — D630 Anemia in neoplastic disease: Secondary | ICD-10-CM | POA: Insufficient documentation

## 2013-10-25 DIAGNOSIS — D46C Myelodysplastic syndrome with isolated del(5q) chromosomal abnormality: Secondary | ICD-10-CM | POA: Insufficient documentation

## 2013-10-25 DIAGNOSIS — D649 Anemia, unspecified: Secondary | ICD-10-CM

## 2013-10-25 LAB — CBC & DIFF AND RETIC
BASO%: 3 % — AB (ref 0.0–2.0)
Basophils Absolute: 0.1 10*3/uL (ref 0.0–0.1)
EOS%: 11.9 % — ABNORMAL HIGH (ref 0.0–7.0)
Eosinophils Absolute: 0.5 10*3/uL (ref 0.0–0.5)
HEMATOCRIT: 22.9 % — AB (ref 34.8–46.6)
HGB: 7.4 g/dL — ABNORMAL LOW (ref 11.6–15.9)
Immature Retic Fract: 13.8 % — ABNORMAL HIGH (ref 1.60–10.00)
LYMPH%: 23 % (ref 14.0–49.7)
MCH: 28.4 pg (ref 25.1–34.0)
MCHC: 32.3 g/dL (ref 31.5–36.0)
MCV: 87.7 fL (ref 79.5–101.0)
MONO#: 0.2 10*3/uL (ref 0.1–0.9)
MONO%: 4.6 % (ref 0.0–14.0)
NEUT#: 2.3 10*3/uL (ref 1.5–6.5)
NEUT%: 57.5 % (ref 38.4–76.8)
PLATELETS: 272 10*3/uL (ref 145–400)
RBC: 2.61 10*6/uL — ABNORMAL LOW (ref 3.70–5.45)
RDW: 16.4 % — ABNORMAL HIGH (ref 11.2–14.5)
Retic %: 1.39 % (ref 0.70–2.10)
Retic Ct Abs: 36.28 10*3/uL (ref 33.70–90.70)
WBC: 4 10*3/uL (ref 3.9–10.3)
lymph#: 0.9 10*3/uL (ref 0.9–3.3)
nRBC: 0 % (ref 0–0)

## 2013-10-25 LAB — COMPREHENSIVE METABOLIC PANEL (CC13)
ALT: 19 U/L (ref 0–55)
ANION GAP: 9 meq/L (ref 3–11)
AST: 11 U/L (ref 5–34)
Albumin: 3.7 g/dL (ref 3.5–5.0)
Alkaline Phosphatase: 44 U/L (ref 40–150)
BUN: 26.4 mg/dL — ABNORMAL HIGH (ref 7.0–26.0)
CHLORIDE: 108 meq/L (ref 98–109)
CO2: 28 mEq/L (ref 22–29)
CREATININE: 0.8 mg/dL (ref 0.6–1.1)
Calcium: 9.3 mg/dL (ref 8.4–10.4)
Glucose: 140 mg/dl (ref 70–140)
Potassium: 3.4 mEq/L — ABNORMAL LOW (ref 3.5–5.1)
Sodium: 145 mEq/L (ref 136–145)
TOTAL PROTEIN: 6.4 g/dL (ref 6.4–8.3)
Total Bilirubin: 0.97 mg/dL (ref 0.20–1.20)

## 2013-10-25 LAB — HOLD TUBE, BLOOD BANK

## 2013-10-25 LAB — PREPARE RBC (CROSSMATCH)

## 2013-10-25 MED ORDER — DIPHENHYDRAMINE HCL 25 MG PO CAPS
ORAL_CAPSULE | ORAL | Status: AC
  Filled 2013-10-25: qty 1

## 2013-10-25 MED ORDER — SODIUM CHLORIDE 0.9 % IJ SOLN
3.0000 mL | INTRAMUSCULAR | Status: AC | PRN
  Filled 2013-10-25: qty 10

## 2013-10-25 MED ORDER — SODIUM CHLORIDE 0.9 % IV SOLN
Freq: Once | INTRAVENOUS | Status: AC
  Administered 2013-10-25: 10:00:00 via INTRAVENOUS

## 2013-10-25 MED ORDER — ACETAMINOPHEN 325 MG PO TABS
650.0000 mg | ORAL_TABLET | Freq: Once | ORAL | Status: AC
  Administered 2013-10-25: 650 mg via ORAL

## 2013-10-25 MED ORDER — ACETAMINOPHEN 325 MG PO TABS
ORAL_TABLET | ORAL | Status: AC
  Filled 2013-10-25: qty 2

## 2013-10-25 MED ORDER — DIPHENHYDRAMINE HCL 25 MG PO CAPS
25.0000 mg | ORAL_CAPSULE | Freq: Once | ORAL | Status: AC
  Administered 2013-10-25: 25 mg via ORAL

## 2013-10-25 NOTE — Progress Notes (Signed)
[place] OFFICE PROGRESS NOTE  Patient Care Team: Mayra Neer, MD as PCP - General (Family Medicine) Heath Lark, MD as Consulting Physician (Hematology and Oncology)  DIAGNOSIS: Myelodysplastic syndrome with severe anemia  SUMMARY OF ONCOLOGIC HISTORY: This is a very pleasant 80 year old lady who become transfusion dependent over the last 3 months. She is transfusion dependent. She denies any bleeding such as epistaxis, hematuria, or hematochezia. 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 Erythropoietin stimulating agents were stopped when her erythropoietin level came back over 500 In October 2014, she was started on Revlimid however develop severe allergic reaction to Revlimid. That was discontinued. On 08/07/2013, we started her on Vidaza for 2 cycles. On 10/04/2013, repeat a bone marrow aspirate and biopsy. Additional testing revealed that her disease is JAK 2 positive  INTERVAL HISTORY: Rebekah Cross 80 y.o. female returns for Further. She complained of diffuse itching and severe fatigue The patient denies any recent signs or symptoms of bleeding such as spontaneous epistaxis, hematuria or hematochezia.   I have reviewed the past medical history, past surgical history, social history and family history with the patient and they are unchanged from previous note.  ALLERGIES:  is allergic to novocain; oxycodone; codeine; methimazole; and revlimid.  MEDICATIONS:  Current Outpatient Prescriptions  Medication Sig Dispense Refill  . acetaminophen (TYLENOL) 500 MG tablet Take 1,000 mg by mouth every 6 (six) hours as needed for fever.      Marland Kitchen aspirin 81 MG tablet Take 81 mg by mouth every morning.       . cholecalciferol (VITAMIN D) 400 UNITS TABS Take 400 Units by mouth every morning.       . ciprofloxacin (CIPRO) 500 MG tablet Take 1 tablet (500 mg total) by mouth 2 (two)  times daily.  14 tablet  0  . hydrOXYzine (ATARAX/VISTARIL) 25 MG tablet TAKE 1 TABLET BY MOUTH EVERY 4 HOURS AS NEEDED FOR ITCHING  60 tablet  1  . LORazepam (ATIVAN) 0.5 MG tablet Take 0.5 mg by mouth at bedtime.       . metoprolol succinate (TOPROL-XL) 100 MG 24 hr tablet Take 50 mg by mouth daily.       . mometasone (ELOCON) 0.1 % cream Apply 1 application topically daily as needed (psoriasis).       . Multiple Vitamin (MULTIVITAMIN) tablet Take 1 tablet by mouth every morning.       . nisoldipine (SULAR) 25.5 MG 24 hr tablet Take 25.5 mg by mouth every morning.       . olmesartan-hydrochlorothiazide (BENICAR HCT) 40-25 MG per tablet Take 1 tablet by mouth every morning.       . ondansetron (ZOFRAN) 8 MG tablet Take 8 mg by mouth every 8 (eight) hours as needed for nausea or vomiting.       . potassium chloride SA (K-DUR,KLOR-CON) 20 MEQ tablet Take 20 mEq by mouth every morning.       . pravastatin (PRAVACHOL) 40 MG tablet Take 40 mg by mouth at bedtime.       . predniSONE (DELTASONE) 10 MG tablet Take 1 tablet (10 mg total) by mouth daily with breakfast.  90 tablet  3  . prochlorperazine (COMPAZINE) 10 MG tablet TAKE 1 TABLET BY MOUTH EVERY 6 HOURS AS NEEDED FOR NAUSEA OR VOMITING  30 tablet  2  . triamcinolone cream (KENALOG) 0.1 % Apply 1 application topically daily  as needed (psoriasis).        No current facility-administered medications for this visit.   Facility-Administered Medications Ordered in Other Visits  Medication Dose Route Frequency Provider Last Rate Last Dose  . sodium chloride 0.9 % injection 3 mL  3 mL Intracatheter PRN Heath Lark, MD        REVIEW OF SYSTEMS:   Constitutional: Denies fevers, chills or abnormal weight loss. She has very poor appetite  Eyes: Denies blurriness of vision Ears, nose, mouth, throat, and face: Denies mucositis or sore throat Respiratory: Denies cough, dyspnea or wheezes Cardiovascular: Denies palpitation, chest discomfort or lower  extremity swelling Gastrointestinal:  Denies nausea, heartburn or change in bowel habits Skin: Denies abnormal skin rashes. She complain of severe itching  Lymphatics: Denies new lymphadenopathy or easy bruising Neurological:Denies numbness, tingling or new weaknesses Behavioral/Psych: Mood is stable, no new changes  All other systems were reviewed with the patient and are negative.  PHYSICAL EXAMINATION: ECOG PERFORMANCE STATUS: 1 - Symptomatic but completely ambulatory  Filed Vitals:   10/25/13 0831  BP: 151/40  Pulse: 71  Temp: 97.3 F (36.3 C)  Resp: 18   Filed Weights   10/25/13 0831  Weight: 136 lb 3.2 oz (61.78 kg)    GENERAL:alert, no distress and comfortable SKIN: skin color, texture, turgor are normal, no rashes or significant lesions EYES: normal, Conjunctiva are pink and non-injected, sclera clear OROPHARYNX:no exudate, no erythema and lips, buccal mucosa, and tongue normal  NECK: supple, thyroid normal size, non-tender, without nodularity LYMPH:  no palpable lymphadenopathy in the cervical, axillary or inguinal LUNGS: clear to auscultation and percussion with normal breathing effort HEART: regular rate & rhythm and no murmurs and no lower extremity edema ABDOMEN:abdomen soft, non-tender and normal bowel sounds Musculoskeletal:no cyanosis of digits and no clubbing  NEURO: alert & oriented x 3 with fluent speech, no focal motor/sensory deficits  LABORATORY DATA:  I have reviewed the data as listed    Component Value Date/Time   NA 145 10/25/2013 0809   NA 136 08/27/2013 1212   K 3.4* 10/25/2013 0809   K 3.4* 08/27/2013 1212   CL 102 08/27/2013 1212   CO2 28 10/25/2013 0809   CO2 23 08/27/2013 1212   GLUCOSE 140 10/25/2013 0809   GLUCOSE 118* 08/27/2013 1212   BUN 26.4* 10/25/2013 0809   BUN 23 08/27/2013 1212   CREATININE 0.8 10/25/2013 0809   CREATININE 0.78 08/27/2013 1212   CALCIUM 9.3 10/25/2013 0809   CALCIUM 9.2 08/27/2013 1212   PROT 6.4 10/25/2013  0809   PROT 6.6 08/27/2013 1212   ALBUMIN 3.7 10/25/2013 0809   ALBUMIN 3.8 08/27/2013 1212   AST 11 10/25/2013 0809   AST 17 08/27/2013 1212   ALT 19 10/25/2013 0809   ALT 25 08/27/2013 1212   ALKPHOS 44 10/25/2013 0809   ALKPHOS 56 08/27/2013 1212   BILITOT 0.97 10/25/2013 0809   BILITOT 1.9* 08/27/2013 1212   GFRNONAA 74* 08/27/2013 1212   GFRAA 85* 08/27/2013 1212    No results found for this basename: SPEP,  UPEP,   kappa and lambda light chains    Lab Results  Component Value Date   WBC 4.0 10/25/2013   NEUTROABS 2.3 10/25/2013   HGB 7.4* 10/25/2013   HCT 22.9* 10/25/2013   MCV 87.7 10/25/2013   PLT 272 10/25/2013      Chemistry      Component Value Date/Time   NA 145 10/25/2013 0809   NA 136 08/27/2013 1212  K 3.4* 10/25/2013 0809   K 3.4* 08/27/2013 1212   CL 102 08/27/2013 1212   CO2 28 10/25/2013 0809   CO2 23 08/27/2013 1212   BUN 26.4* 10/25/2013 0809   BUN 23 08/27/2013 1212   CREATININE 0.8 10/25/2013 0809   CREATININE 0.78 08/27/2013 1212      Component Value Date/Time   CALCIUM 9.3 10/25/2013 0809   CALCIUM 9.2 08/27/2013 1212   ALKPHOS 44 10/25/2013 0809   ALKPHOS 56 08/27/2013 1212   AST 11 10/25/2013 0809   AST 17 08/27/2013 1212   ALT 19 10/25/2013 0809   ALT 25 08/27/2013 1212   BILITOT 0.97 10/25/2013 0809   BILITOT 1.9* 08/27/2013 1212     ASSESSMENT & PLAN:  #1 myelodysplastic syndrome/myeloproliferative disorder The patient continued to be transfusion dependent over the past 2 months. She had received 2 cycles of Vidaza. I will discontinue her treatment. Her bone marrow aspirate and biopsy show no response to treatment To review her case yesterday at the hematology tumor board We discussed about the possible use of ruxolitinib as her disease is positive for JAK 2 mutation This may certainly help with her itching but is unclear whether it will help with the anemia since anemia is well-known side effects of treatment. She is profoundly anemic today  with symptoms and I will go ahead and arrange for blood transfusion  I recommend second opinion at [facility] Other possible alternatives including danazol or thalidomide  #2 severe anemia She was transfused last week. This is likely due to recent treatment. The patient denies recent history of bleeding such as epistaxis, hematuria or hematochezia. She is symptomatic from the anemia today  I recommend transfusion to keep her hemoglobin above 8 g. I recommend increasing her prednisone to 10 mg in the meantime. Sometimes low dose prednisone may help with anemia temporally  All questions were answered. The patient knows to call the clinic with any problems, questions or concerns. No barriers to learning was detected. I spent 25 minutes counseling the patient face to face. The total time spent in the appointment was 40 minutes and more than 50% was on counseling and review of test results     [facility], [place], MD 10/25/2013 7:14 PM

## 2013-10-25 NOTE — Patient Instructions (Signed)
Blood Transfusion  A blood transfusion replaces your blood or some of its parts. Blood is replaced when you have lost blood because of surgery, an accident, or for severe blood conditions like anemia. You can donate blood to be used on yourself if you have a planned surgery. If you lose blood during that surgery, your own blood can be given back to you. Any blood given to you is checked to make sure it matches your blood type. Your temperature, blood pressure, and heart rate (vital signs) will be checked often.  GET HELP RIGHT AWAY IF:   You feel sick to your stomach (nauseous) or throw up (vomit).  You have watery poop (diarrhea).  You have shortness of breath or trouble breathing.  You have blood in your pee (urine) or have dark colored pee.  You have chest pain or tightness.  Your eyes or skin turn yellow (jaundice).  You have a temperature by mouth above 102 F (38.9 C), not controlled by medicine.  You start to shake and have chills.  You develop a a red rash (hives) or feel itchy.  You develop lightheadedness or feel confused.  You develop back, joint, or muscle pain.  You do not feel hungry (lost appetite).  You feel tired, restless, or nervous.  You develop belly (abdominal) cramps. Document Released: 12/18/2008 Document Revised: 12/14/2011 Document Reviewed: 12/18/2008 ExitCare Patient Information 2014 [employer].  

## 2013-10-26 LAB — TYPE AND SCREEN
ABO/RH(D): A POS
Antibody Screen: NEGATIVE
UNIT DIVISION: 0
Unit division: 0

## 2013-10-27 ENCOUNTER — Telehealth: Payer: Self-pay | Admitting: *Deleted

## 2013-10-27 LAB — OTHER SOLSTAS TEST

## 2013-10-27 NOTE — Telephone Encounter (Signed)
Dr Janene Madeira left a voice mail stating you can call him at [phone] to discuss Rebekah Cross. He will be faxing his recommendations and notes to you.

## 2013-10-30 ENCOUNTER — Telehealth: Payer: Self-pay | Admitting: Hematology and Oncology

## 2013-10-30 ENCOUNTER — Other Ambulatory Visit: Payer: Self-pay | Admitting: Hematology and Oncology

## 2013-10-30 NOTE — Telephone Encounter (Signed)
s.w. pt and advised on 2.2.15 appt...pt ok and aware

## 2013-10-30 NOTE — Telephone Encounter (Signed)
I spoke with Dr. Janene Madeira from [facility] to discuss this case. He recommend that we resume 5 days for at least 4 more months of treatment before repeating a bone marrow aspirate and biopsy. I called the patient's daughter to reschedule an appointment to see me and restart chemotherapy next week

## 2013-11-01 ENCOUNTER — Other Ambulatory Visit: Payer: Medicare Other

## 2013-11-03 ENCOUNTER — Other Ambulatory Visit: Payer: Self-pay | Admitting: *Deleted

## 2013-11-03 ENCOUNTER — Telehealth: Payer: Self-pay | Admitting: Hematology and Oncology

## 2013-11-03 NOTE — Telephone Encounter (Signed)
added remaing tx for 2/3 thru 2/6 per 1/30 pof. s/w pt she is aware. confirmed 2/2 appt and pt will get new schedule when she comes in.

## 2013-11-06 ENCOUNTER — Ambulatory Visit (HOSPITAL_BASED_OUTPATIENT_CLINIC_OR_DEPARTMENT_OTHER): Payer: Medicare Other

## 2013-11-06 ENCOUNTER — Encounter (INDEPENDENT_AMBULATORY_CARE_PROVIDER_SITE_OTHER): Payer: Self-pay

## 2013-11-06 ENCOUNTER — Ambulatory Visit (HOSPITAL_BASED_OUTPATIENT_CLINIC_OR_DEPARTMENT_OTHER): Payer: Medicare Other | Admitting: Hematology and Oncology

## 2013-11-06 ENCOUNTER — Ambulatory Visit: Payer: Medicare Other

## 2013-11-06 ENCOUNTER — Telehealth: Payer: Self-pay | Admitting: Hematology and Oncology

## 2013-11-06 ENCOUNTER — Other Ambulatory Visit (HOSPITAL_BASED_OUTPATIENT_CLINIC_OR_DEPARTMENT_OTHER): Payer: Medicare Other

## 2013-11-06 VITALS — BP 134/42 | HR 74 | Temp 97.9°F | Resp 18 | Ht 62.0 in | Wt 136.8 lb

## 2013-11-06 DIAGNOSIS — D46C Myelodysplastic syndrome with isolated del(5q) chromosomal abnormality: Secondary | ICD-10-CM

## 2013-11-06 DIAGNOSIS — D649 Anemia, unspecified: Secondary | ICD-10-CM

## 2013-11-06 DIAGNOSIS — Z5111 Encounter for antineoplastic chemotherapy: Secondary | ICD-10-CM

## 2013-11-06 DIAGNOSIS — R209 Unspecified disturbances of skin sensation: Secondary | ICD-10-CM

## 2013-11-06 LAB — COMPREHENSIVE METABOLIC PANEL (CC13)
ALBUMIN: 4 g/dL (ref 3.5–5.0)
ALT: 17 U/L (ref 0–55)
ANION GAP: 11 meq/L (ref 3–11)
AST: 12 U/L (ref 5–34)
Alkaline Phosphatase: 43 U/L (ref 40–150)
BUN: 23.3 mg/dL (ref 7.0–26.0)
CALCIUM: 9.8 mg/dL (ref 8.4–10.4)
CHLORIDE: 106 meq/L (ref 98–109)
CO2: 27 mEq/L (ref 22–29)
Creatinine: 0.8 mg/dL (ref 0.6–1.1)
GLUCOSE: 147 mg/dL — AB (ref 70–140)
POTASSIUM: 3.5 meq/L (ref 3.5–5.1)
Sodium: 144 mEq/L (ref 136–145)
Total Bilirubin: 1.03 mg/dL (ref 0.20–1.20)
Total Protein: 6.5 g/dL (ref 6.4–8.3)

## 2013-11-06 LAB — CBC & DIFF AND RETIC
BASO%: 3 % — ABNORMAL HIGH (ref 0.0–2.0)
Basophils Absolute: 0.2 10*3/uL — ABNORMAL HIGH (ref 0.0–0.1)
EOS%: 11.6 % — AB (ref 0.0–7.0)
Eosinophils Absolute: 0.8 10*3/uL — ABNORMAL HIGH (ref 0.0–0.5)
HCT: 27.6 % — ABNORMAL LOW (ref 34.8–46.6)
HGB: 9.1 g/dL — ABNORMAL LOW (ref 11.6–15.9)
Immature Retic Fract: 10.7 % — ABNORMAL HIGH (ref 1.60–10.00)
LYMPH%: 8.9 % — ABNORMAL LOW (ref 14.0–49.7)
MCH: 28.5 pg (ref 25.1–34.0)
MCHC: 33 g/dL (ref 31.5–36.0)
MCV: 86.5 fL (ref 79.5–101.0)
MONO#: 0.2 10*3/uL (ref 0.1–0.9)
MONO%: 3.1 % (ref 0.0–14.0)
NEUT#: 5.2 10*3/uL (ref 1.5–6.5)
NEUT%: 73.4 % (ref 38.4–76.8)
Platelets: 337 10*3/uL (ref 145–400)
RBC: 3.19 10*6/uL — ABNORMAL LOW (ref 3.70–5.45)
RDW: 16 % — AB (ref 11.2–14.5)
RETIC %: 1.45 % (ref 0.70–2.10)
Retic Ct Abs: 46.26 10*3/uL (ref 33.70–90.70)
WBC: 7 10*3/uL (ref 3.9–10.3)
lymph#: 0.6 10*3/uL — ABNORMAL LOW (ref 0.9–3.3)
nRBC: 0 % (ref 0–0)

## 2013-11-06 LAB — HOLD TUBE, BLOOD BANK

## 2013-11-06 MED ORDER — ONDANSETRON HCL 8 MG PO TABS
ORAL_TABLET | ORAL | Status: AC
  Filled 2013-11-06: qty 1

## 2013-11-06 MED ORDER — AZACITIDINE CHEMO SQ INJECTION
75.0000 mg/m2 | Freq: Once | INTRAMUSCULAR | Status: AC
  Administered 2013-11-06: 125 mg via SUBCUTANEOUS
  Filled 2013-11-06: qty 5

## 2013-11-06 MED ORDER — ONDANSETRON HCL 8 MG PO TABS
8.0000 mg | ORAL_TABLET | Freq: Once | ORAL | Status: AC
  Administered 2013-11-06: 8 mg via ORAL

## 2013-11-06 NOTE — Patient Instructions (Signed)
[facility] Discharge Instructions for Patients Receiving Chemotherapy  Today you received the following chemotherapy agents :  Vidaza.  To help prevent nausea and vomiting after your treatment, we encourage you to take your nausea medication as instructed by your physician.   If you develop nausea and vomiting that is not controlled by your nausea medication, call the clinic.   BELOW ARE SYMPTOMS THAT SHOULD BE REPORTED IMMEDIATELY:  *FEVER GREATER THAN 100.5 F  *CHILLS WITH OR WITHOUT FEVER  NAUSEA AND VOMITING THAT IS NOT CONTROLLED WITH YOUR NAUSEA MEDICATION  *UNUSUAL SHORTNESS OF BREATH  *UNUSUAL BRUISING OR BLEEDING  TENDERNESS IN MOUTH AND THROAT WITH OR WITHOUT PRESENCE OF ULCERS  *URINARY PROBLEMS  *BOWEL PROBLEMS  UNUSUAL RASH Items with * indicate a potential emergency and should be followed up as soon as possible.  Feel free to call the clinic you have any questions or concerns. The clinic phone number is [phone].    

## 2013-11-06 NOTE — Progress Notes (Signed)
[place] OFFICE PROGRESS NOTE  Patient Care Team: Mayra Neer, MD as PCP - General (Family Medicine) Heath Lark, MD as Consulting Physician (Hematology and Oncology)  DIAGNOSIS: Myelodysplastic syndrome with severe anemia  SUMMARY OF ONCOLOGIC HISTORY: This is a very pleasant 80 year old lady who become transfusion dependent over the last 3 months. She is transfusion dependent. She denies any bleeding such as epistaxis, hematuria, or hematochezia. 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 Erythropoietin stimulating agents were stopped when her erythropoietin level came back over 500 In October 2014, she was started on Revlimid however develop severe allergic reaction to Revlimid. That was discontinued. On 08/07/2013, we started her on Vidaza for 2 cycles. On 10/04/2013, repeat a bone marrow aspirate and biopsy. Additional testing revealed that her disease is JAK 2 positive  INTERVAL HISTORY: Rebekah Cross 80 y.o. female returns for further followup. Per recommendation from [facility], we will be restarting her on Fridays 7 days injection instead of 5 days. She received blood transfusion recently with improvement of her anemia. Denies any chest pain, shortness of breath or dizziness. The skin itching is improving. The patient denies any recent signs or symptoms of bleeding such as spontaneous epistaxis, hematuria or hematochezia.   I have reviewed the past medical history, past surgical history, social history and family history with the patient and they are unchanged from previous note.  ALLERGIES:  is allergic to novocain; oxycodone; codeine; methimazole; and revlimid.  MEDICATIONS:  Current Outpatient Prescriptions  Medication Sig Dispense Refill  . acetaminophen (TYLENOL) 500 MG tablet Take 1,000 mg by mouth every 6 (six) hours as needed for fever.      Marland Kitchen  aspirin 81 MG tablet Take 81 mg by mouth every morning.       . cholecalciferol (VITAMIN D) 400 UNITS TABS Take 400 Units by mouth every morning.       . ciprofloxacin (CIPRO) 500 MG tablet Take 1 tablet (500 mg total) by mouth 2 (two) times daily.  14 tablet  0  . hydrOXYzine (ATARAX/VISTARIL) 25 MG tablet TAKE 1 TABLET BY MOUTH EVERY 4 HOURS AS NEEDED FOR ITCHING  60 tablet  1  . LORazepam (ATIVAN) 0.5 MG tablet Take 0.5 mg by mouth at bedtime.       . metoprolol succinate (TOPROL-XL) 100 MG 24 hr tablet Take 50 mg by mouth daily.       . mometasone (ELOCON) 0.1 % cream Apply 1 application topically daily as needed (psoriasis).       . Multiple Vitamin (MULTIVITAMIN) tablet Take 1 tablet by mouth every morning.       . nisoldipine (SULAR) 25.5 MG 24 hr tablet Take 25.5 mg by mouth every morning.       . olmesartan-hydrochlorothiazide (BENICAR HCT) 40-25 MG per tablet Take 1 tablet by mouth every morning.       . ondansetron (ZOFRAN) 8 MG tablet Take 8 mg by mouth every 8 (eight) hours as needed for nausea or vomiting.       . potassium chloride SA (K-DUR,KLOR-CON) 20 MEQ tablet Take 20 mEq by mouth every morning.       . pravastatin (PRAVACHOL) 40 MG tablet Take 40 mg by mouth at bedtime.       . predniSONE (DELTASONE) 10 MG tablet Take 1 tablet (10 mg total) by mouth daily with breakfast.  90 tablet  3  .  prochlorperazine (COMPAZINE) 10 MG tablet TAKE 1 TABLET BY MOUTH EVERY 6 HOURS AS NEEDED FOR NAUSEA OR VOMITING  30 tablet  2  . triamcinolone cream (KENALOG) 0.1 % Apply 1 application topically daily as needed (psoriasis).        No current facility-administered medications for this visit.   Facility-Administered Medications Ordered in Other Visits  Medication Dose Route Frequency Provider Last Rate Last Dose  . sodium chloride 0.9 % injection 3 mL  3 mL Intracatheter PRN Heath Lark, MD        REVIEW OF SYSTEMS:   Constitutional: Denies fevers, chills or abnormal weight loss Eyes:  Denies blurriness of vision Ears, nose, mouth, throat, and face: Denies mucositis or sore throat Respiratory: Denies cough, dyspnea or wheezes Cardiovascular: Denies palpitation, chest discomfort or lower extremity swelling Gastrointestinal:  Denies nausea, heartburn or change in bowel habits Skin: Denies abnormal skin rashes Lymphatics: Denies new lymphadenopathy or easy bruising Neurological:Denies numbness, tingling or new weaknesses Behavioral/Psych: Mood is stable, no new changes  All other systems were reviewed with the patient and are negative.  PHYSICAL EXAMINATION: ECOG PERFORMANCE STATUS: 1 - Symptomatic but completely ambulatory  Filed Vitals:   11/06/13 1002  BP: 134/42  Pulse: 74  Temp: 97.9 F (36.6 C)  Resp: 18   Filed Weights   11/06/13 1002  Weight: 136 lb 12.8 oz (62.052 kg)    GENERAL:alert, no distress and comfortable SKIN: skin color, texture, turgor are normal, no rashes or significant lesions EYES: normal, Conjunctiva are pink and non-injected, sclera clear OROPHARYNX:no exudate, no erythema and lips, buccal mucosa, and tongue normal  NECK: supple, thyroid normal size, non-tender, without nodularity LYMPH:  no palpable lymphadenopathy in the cervical, axillary or inguinal LUNGS: clear to auscultation and percussion with normal breathing effort HEART: regular rate & rhythm and no murmurs and no lower extremity edema ABDOMEN:abdomen soft, non-tender and normal bowel sounds Musculoskeletal:no cyanosis of digits and no clubbing  NEURO: alert & oriented x 3 with fluent speech, no focal motor/sensory deficits  LABORATORY DATA:  I have reviewed the data as listed    Component Value Date/Time   NA 145 10/25/2013 0809   NA 136 08/27/2013 1212   K 3.4* 10/25/2013 0809   K 3.4* 08/27/2013 1212   CL 102 08/27/2013 1212   CO2 28 10/25/2013 0809   CO2 23 08/27/2013 1212   GLUCOSE 140 10/25/2013 0809   GLUCOSE 118* 08/27/2013 1212   BUN 26.4* 10/25/2013 0809    BUN 23 08/27/2013 1212   CREATININE 0.8 10/25/2013 0809   CREATININE 0.78 08/27/2013 1212   CALCIUM 9.3 10/25/2013 0809   CALCIUM 9.2 08/27/2013 1212   PROT 6.4 10/25/2013 0809   PROT 6.6 08/27/2013 1212   ALBUMIN 3.7 10/25/2013 0809   ALBUMIN 3.8 08/27/2013 1212   AST 11 10/25/2013 0809   AST 17 08/27/2013 1212   ALT 19 10/25/2013 0809   ALT 25 08/27/2013 1212   ALKPHOS 44 10/25/2013 0809   ALKPHOS 56 08/27/2013 1212   BILITOT 0.97 10/25/2013 0809   BILITOT 1.9* 08/27/2013 1212   GFRNONAA 74* 08/27/2013 1212   GFRAA 85* 08/27/2013 1212    No results found for this basename: SPEP,  UPEP,   kappa and lambda light chains    Lab Results  Component Value Date   WBC 7.0 11/06/2013   NEUTROABS 5.2 11/06/2013   HGB 9.1* 11/06/2013   HCT 27.6* 11/06/2013   MCV 86.5 11/06/2013   PLT 337 11/06/2013  Chemistry      Component Value Date/Time   NA 145 10/25/2013 0809   NA 136 08/27/2013 1212   K 3.4* 10/25/2013 0809   K 3.4* 08/27/2013 1212   CL 102 08/27/2013 1212   CO2 28 10/25/2013 0809   CO2 23 08/27/2013 1212   BUN 26.4* 10/25/2013 0809   BUN 23 08/27/2013 1212   CREATININE 0.8 10/25/2013 0809   CREATININE 0.78 08/27/2013 1212      Component Value Date/Time   CALCIUM 9.3 10/25/2013 0809   CALCIUM 9.2 08/27/2013 1212   ALKPHOS 44 10/25/2013 0809   ALKPHOS 56 08/27/2013 1212   AST 11 10/25/2013 0809   AST 17 08/27/2013 1212   ALT 19 10/25/2013 0809   ALT 25 08/27/2013 1212   BILITOT 0.97 10/25/2013 0809   BILITOT 1.9* 08/27/2013 1212     ASSESSMENT & PLAN:  #1 myelodysplastic syndrome Per recommendation from [facility], we'll resume treatment with Vidaza however we will be giving her 7 days injection instead of 5 days injection. I will start to initiate prednisone taper to 5 mg daily and we'll plan to wean her off prednisone next month  #2 severe anemia The patient has received blood transfusion recently. This is likely due to recent treatment. The patient denies recent history  of bleeding such as epistaxis, hematuria or hematochezia. She is asymptomatic from the anemia. I will observe for now.  She does not require transfusion now. I will continue the chemotherapy at current dose without dosage adjustment.  If the anemia gets progressive worse in the future, I might have to delay her treatment or adjust the chemotherapy dose. My goal would be to keep a hemoglobin greater than 8 g and we will be transfusing 2 units at each time. I have made lab appointment for her to come by every week to check on this.  #3 severe skin itching This is due to her disease. She will continue to use over-the-counter medicine for this.  All questions were answered. The patient knows to call the clinic with any problems, questions or concerns. No barriers to learning was detected.    [place], [place], MD 11/06/2013 10:19 AM

## 2013-11-06 NOTE — Telephone Encounter (Signed)
gv adn printed appt sched and avs for pt for Feb and March sed added tx. °

## 2013-11-07 ENCOUNTER — Ambulatory Visit (HOSPITAL_BASED_OUTPATIENT_CLINIC_OR_DEPARTMENT_OTHER): Payer: Medicare Other

## 2013-11-07 VITALS — BP 109/68 | HR 66 | Temp 97.5°F | Resp 18

## 2013-11-07 DIAGNOSIS — Z5111 Encounter for antineoplastic chemotherapy: Secondary | ICD-10-CM

## 2013-11-07 DIAGNOSIS — D46C Myelodysplastic syndrome with isolated del(5q) chromosomal abnormality: Secondary | ICD-10-CM

## 2013-11-07 MED ORDER — ONDANSETRON HCL 8 MG PO TABS
8.0000 mg | ORAL_TABLET | Freq: Once | ORAL | Status: AC
  Administered 2013-11-07: 8 mg via ORAL

## 2013-11-07 MED ORDER — ONDANSETRON HCL 8 MG PO TABS
ORAL_TABLET | ORAL | Status: AC
  Filled 2013-11-07: qty 1

## 2013-11-07 MED ORDER — AZACITIDINE CHEMO SQ INJECTION
75.0000 mg/m2 | Freq: Once | INTRAMUSCULAR | Status: AC
  Administered 2013-11-07: 125 mg via SUBCUTANEOUS
  Filled 2013-11-07: qty 5

## 2013-11-07 NOTE — Patient Instructions (Signed)
[facility] Discharge Instructions for Patients Receiving Chemotherapy  Today you received the following chemotherapy agent: Vidaza   To help prevent nausea and vomiting after your treatment, we encourage you to take your nausea medication as prescribed.    If you develop nausea and vomiting that is not controlled by your nausea medication, call the clinic.   BELOW ARE SYMPTOMS THAT SHOULD BE REPORTED IMMEDIATELY:  *FEVER GREATER THAN 100.5 F  *CHILLS WITH OR WITHOUT FEVER  NAUSEA AND VOMITING THAT IS NOT CONTROLLED WITH YOUR NAUSEA MEDICATION  *UNUSUAL SHORTNESS OF BREATH  *UNUSUAL BRUISING OR BLEEDING  TENDERNESS IN MOUTH AND THROAT WITH OR WITHOUT PRESENCE OF ULCERS  *URINARY PROBLEMS  *BOWEL PROBLEMS  UNUSUAL RASH Items with * indicate a potential emergency and should be followed up as soon as possible.  Feel free to call the clinic you have any questions or concerns. The clinic phone number is [phone].    

## 2013-11-08 ENCOUNTER — Ambulatory Visit (HOSPITAL_BASED_OUTPATIENT_CLINIC_OR_DEPARTMENT_OTHER): Payer: Medicare Other

## 2013-11-08 ENCOUNTER — Telehealth: Payer: Self-pay | Admitting: *Deleted

## 2013-11-08 VITALS — BP 135/45 | HR 71 | Temp 98.1°F | Resp 20

## 2013-11-08 DIAGNOSIS — D46C Myelodysplastic syndrome with isolated del(5q) chromosomal abnormality: Secondary | ICD-10-CM

## 2013-11-08 DIAGNOSIS — Z5111 Encounter for antineoplastic chemotherapy: Secondary | ICD-10-CM

## 2013-11-08 MED ORDER — ONDANSETRON HCL 8 MG PO TABS
8.0000 mg | ORAL_TABLET | Freq: Once | ORAL | Status: AC
  Administered 2013-11-08: 8 mg via ORAL

## 2013-11-08 MED ORDER — AZACITIDINE CHEMO SQ INJECTION
75.0000 mg/m2 | Freq: Once | INTRAMUSCULAR | Status: AC
  Administered 2013-11-08: 125 mg via SUBCUTANEOUS
  Filled 2013-11-08: qty 5

## 2013-11-08 MED ORDER — PROCHLORPERAZINE MALEATE 10 MG PO TABS: ORAL_TABLET | ORAL | Status: DC

## 2013-11-08 MED ORDER — ONDANSETRON HCL 8 MG PO TABS
ORAL_TABLET | ORAL | Status: AC
  Filled 2013-11-08: qty 1

## 2013-11-08 NOTE — Telephone Encounter (Signed)
Daughter requested refill on compazine.  States pt nauseated and taking compazine twice daily.  She has not tried the zofran.  Refill sent on compazine.  Instructed dau to encourage pt to also try the zofran at least twice daily for nausea and then compazine prn for any additional nausea.  She verbalized understanding.

## 2013-11-08 NOTE — Patient Instructions (Signed)
[facility] Discharge Instructions for Patients Receiving Chemotherapy  Today you received the following chemotherapy agents:  Vidaza  To help prevent nausea and vomiting after your treatment, we encourage you to take your nausea medication as ordered per MD.   If you develop nausea and vomiting that is not controlled by your nausea medication, call the clinic.   BELOW ARE SYMPTOMS THAT SHOULD BE REPORTED IMMEDIATELY:  *FEVER GREATER THAN 100.5 F  *CHILLS WITH OR WITHOUT FEVER  NAUSEA AND VOMITING THAT IS NOT CONTROLLED WITH YOUR NAUSEA MEDICATION  *UNUSUAL SHORTNESS OF BREATH  *UNUSUAL BRUISING OR BLEEDING  TENDERNESS IN MOUTH AND THROAT WITH OR WITHOUT PRESENCE OF ULCERS  *URINARY PROBLEMS  *BOWEL PROBLEMS  UNUSUAL RASH Items with * indicate a potential emergency and should be followed up as soon as possible.  Feel free to call the clinic you have any questions or concerns. The clinic phone number is [phone].    

## 2013-11-09 ENCOUNTER — Ambulatory Visit (HOSPITAL_BASED_OUTPATIENT_CLINIC_OR_DEPARTMENT_OTHER): Payer: Medicare Other

## 2013-11-09 VITALS — BP 141/47 | HR 69 | Temp 97.7°F | Resp 18

## 2013-11-09 DIAGNOSIS — Z5111 Encounter for antineoplastic chemotherapy: Secondary | ICD-10-CM

## 2013-11-09 DIAGNOSIS — D46C Myelodysplastic syndrome with isolated del(5q) chromosomal abnormality: Secondary | ICD-10-CM

## 2013-11-09 MED ORDER — ONDANSETRON HCL 8 MG PO TABS
ORAL_TABLET | ORAL | Status: AC
  Filled 2013-11-09: qty 1

## 2013-11-09 MED ORDER — ONDANSETRON HCL 8 MG PO TABS
8.0000 mg | ORAL_TABLET | Freq: Once | ORAL | Status: AC
  Administered 2013-11-09: 8 mg via ORAL

## 2013-11-09 MED ORDER — AZACITIDINE CHEMO SQ INJECTION
75.0000 mg/m2 | Freq: Once | INTRAMUSCULAR | Status: AC
  Administered 2013-11-09: 125 mg via SUBCUTANEOUS
  Filled 2013-11-09: qty 5

## 2013-11-09 NOTE — Patient Instructions (Signed)
[facility] Discharge Instructions for Patients Receiving Chemotherapy  Today you received the following chemotherapy agents: Vidaza  To help prevent nausea and vomiting after your treatment, we encourage you to take your nausea medication as prescribed by your physician.   If you develop nausea and vomiting that is not controlled by your nausea medication, call the clinic.   BELOW ARE SYMPTOMS THAT SHOULD BE REPORTED IMMEDIATELY:  *FEVER GREATER THAN 100.5 F  *CHILLS WITH OR WITHOUT FEVER  NAUSEA AND VOMITING THAT IS NOT CONTROLLED WITH YOUR NAUSEA MEDICATION  *UNUSUAL SHORTNESS OF BREATH  *UNUSUAL BRUISING OR BLEEDING  TENDERNESS IN MOUTH AND THROAT WITH OR WITHOUT PRESENCE OF ULCERS  *URINARY PROBLEMS  *BOWEL PROBLEMS  UNUSUAL RASH Items with * indicate a potential emergency and should be followed up as soon as possible.  Feel free to call the clinic you have any questions or concerns. The clinic phone number is [phone].    

## 2013-11-10 ENCOUNTER — Other Ambulatory Visit: Payer: Self-pay | Admitting: *Deleted

## 2013-11-10 ENCOUNTER — Ambulatory Visit (HOSPITAL_BASED_OUTPATIENT_CLINIC_OR_DEPARTMENT_OTHER): Payer: Medicare Other

## 2013-11-10 ENCOUNTER — Encounter: Payer: Self-pay | Admitting: Hematology and Oncology

## 2013-11-10 VITALS — BP 144/40 | HR 71 | Temp 97.4°F

## 2013-11-10 DIAGNOSIS — D46C Myelodysplastic syndrome with isolated del(5q) chromosomal abnormality: Secondary | ICD-10-CM

## 2013-11-10 DIAGNOSIS — Z5111 Encounter for antineoplastic chemotherapy: Secondary | ICD-10-CM

## 2013-11-10 MED ORDER — AZACITIDINE CHEMO SQ INJECTION
75.0000 mg/m2 | Freq: Once | INTRAMUSCULAR | Status: AC
  Administered 2013-11-10: 125 mg via SUBCUTANEOUS
  Filled 2013-11-10: qty 5

## 2013-11-10 MED ORDER — ONDANSETRON HCL 8 MG PO TABS
ORAL_TABLET | ORAL | Status: AC
  Filled 2013-11-10: qty 1

## 2013-11-10 MED ORDER — ONDANSETRON HCL 8 MG PO TABS
8.0000 mg | ORAL_TABLET | Freq: Once | ORAL | Status: AC
  Administered 2013-11-10: 8 mg via ORAL

## 2013-11-10 NOTE — Patient Instructions (Signed)
[facility] Discharge [facility] for Patients Receiving Chemotherapy  Today you received the following chemotherapy agents Vidaza. To help prevent nausea and vomiting after your treatment, we encourage you to take your nausea medication as prescribed.    If you develop nausea and vomiting that is not controlled by your nausea medication, call the clinic.   BELOW ARE SYMPTOMS THAT SHOULD BE REPORTED IMMEDIATELY:  *FEVER GREATER THAN 100.5 F  *CHILLS WITH OR WITHOUT FEVER  NAUSEA AND VOMITING THAT IS NOT CONTROLLED WITH YOUR NAUSEA MEDICATION  *UNUSUAL SHORTNESS OF BREATH  *UNUSUAL BRUISING OR BLEEDING  TENDERNESS IN MOUTH AND THROAT WITH OR WITHOUT PRESENCE OF ULCERS  *URINARY PROBLEMS  *BOWEL PROBLEMS  UNUSUAL RASH Items with * indicate a potential emergency and should be followed up as soon as possible.  Feel free to call the clinic you have any questions or concerns. The clinic phone number is [phone].    

## 2013-11-13 ENCOUNTER — Other Ambulatory Visit: Payer: Self-pay | Admitting: Hematology and Oncology

## 2013-11-13 ENCOUNTER — Ambulatory Visit (HOSPITAL_COMMUNITY)
Admission: RE | Admit: 2013-11-13 | Discharge: 2013-11-13 | Disposition: A | Discharge status: Home / Self Care | Payer: Medicare Other | Source: Ambulatory Visit | Attending: Hematology and Oncology | Admitting: Hematology and Oncology

## 2013-11-13 ENCOUNTER — Other Ambulatory Visit (HOSPITAL_BASED_OUTPATIENT_CLINIC_OR_DEPARTMENT_OTHER): Payer: Medicare Other

## 2013-11-13 ENCOUNTER — Ambulatory Visit (HOSPITAL_BASED_OUTPATIENT_CLINIC_OR_DEPARTMENT_OTHER): Payer: Medicare Other

## 2013-11-13 VITALS — BP 138/40 | HR 72 | Temp 97.8°F | Resp 20

## 2013-11-13 DIAGNOSIS — D630 Anemia in neoplastic disease: Secondary | ICD-10-CM | POA: Insufficient documentation

## 2013-11-13 DIAGNOSIS — D46C Myelodysplastic syndrome with isolated del(5q) chromosomal abnormality: Secondary | ICD-10-CM

## 2013-11-13 DIAGNOSIS — Z5111 Encounter for antineoplastic chemotherapy: Secondary | ICD-10-CM

## 2013-11-13 DIAGNOSIS — D469 Myelodysplastic syndrome, unspecified: Secondary | ICD-10-CM

## 2013-11-13 LAB — CBC & DIFF AND RETIC
BASO%: 3.2 % — AB (ref 0.0–2.0)
Basophils Absolute: 0.2 10*3/uL — ABNORMAL HIGH (ref 0.0–0.1)
EOS ABS: 0.7 10*3/uL — AB (ref 0.0–0.5)
EOS%: 12.9 % — ABNORMAL HIGH (ref 0.0–7.0)
HCT: 23.5 % — ABNORMAL LOW (ref 34.8–46.6)
HGB: 7.6 g/dL — ABNORMAL LOW (ref 11.6–15.9)
IMMATURE RETIC FRACT: 12.4 % — AB (ref 1.60–10.00)
LYMPH#: 0.8 10*3/uL — AB (ref 0.9–3.3)
LYMPH%: 13.4 % — ABNORMAL LOW (ref 14.0–49.7)
MCH: 27.9 pg (ref 25.1–34.0)
MCHC: 32.3 g/dL (ref 31.5–36.0)
MCV: 86.4 fL (ref 79.5–101.0)
MONO#: 0.1 10*3/uL (ref 0.1–0.9)
MONO%: 1.2 % (ref 0.0–14.0)
NEUT%: 69.3 % (ref 38.4–76.8)
NEUTROS ABS: 3.9 10*3/uL (ref 1.5–6.5)
NRBC: 0 % (ref 0–0)
Platelets: 270 10*3/uL (ref 145–400)
RBC: 2.72 10*6/uL — AB (ref 3.70–5.45)
RDW: 16.5 % — AB (ref 11.2–14.5)
RETIC %: 1 % (ref 0.70–2.10)
RETIC CT ABS: 27.2 10*3/uL — AB (ref 33.70–90.70)
WBC: 5.7 10*3/uL (ref 3.9–10.3)

## 2013-11-13 LAB — COMPREHENSIVE METABOLIC PANEL (CC13)
ALK PHOS: 42 U/L (ref 40–150)
ALT: 17 U/L (ref 0–55)
ANION GAP: 9 meq/L (ref 3–11)
AST: 11 U/L (ref 5–34)
Albumin: 3.8 g/dL (ref 3.5–5.0)
BILIRUBIN TOTAL: 0.85 mg/dL (ref 0.20–1.20)
BUN: 20.4 mg/dL (ref 7.0–26.0)
CO2: 30 mEq/L — ABNORMAL HIGH (ref 22–29)
Calcium: 10.1 mg/dL (ref 8.4–10.4)
Chloride: 106 mEq/L (ref 98–109)
Creatinine: 0.8 mg/dL (ref 0.6–1.1)
Glucose: 176 mg/dl — ABNORMAL HIGH (ref 70–140)
Potassium: 3.5 mEq/L (ref 3.5–5.1)
SODIUM: 145 meq/L (ref 136–145)
TOTAL PROTEIN: 6.2 g/dL — AB (ref 6.4–8.3)

## 2013-11-13 LAB — PREPARE RBC (CROSSMATCH)

## 2013-11-13 LAB — HOLD TUBE, BLOOD BANK

## 2013-11-13 MED ORDER — SODIUM CHLORIDE 0.9 % IV SOLN
250.0000 mL | Freq: Once | INTRAVENOUS | Status: AC
  Administered 2013-11-13: 250 mL via INTRAVENOUS

## 2013-11-13 MED ORDER — ONDANSETRON HCL 8 MG PO TABS
ORAL_TABLET | ORAL | Status: AC
  Filled 2013-11-13: qty 1

## 2013-11-13 MED ORDER — ONDANSETRON HCL 8 MG PO TABS
8.0000 mg | ORAL_TABLET | Freq: Once | ORAL | Status: AC
  Administered 2013-11-13: 8 mg via ORAL

## 2013-11-13 MED ORDER — ACETAMINOPHEN 325 MG PO TABS
650.0000 mg | ORAL_TABLET | Freq: Once | ORAL | Status: AC
  Administered 2013-11-13: 650 mg via ORAL

## 2013-11-13 MED ORDER — AZACITIDINE CHEMO SQ INJECTION
75.0000 mg/m2 | Freq: Once | INTRAMUSCULAR | Status: AC
  Administered 2013-11-13: 125 mg via SUBCUTANEOUS
  Filled 2013-11-13: qty 5

## 2013-11-13 MED ORDER — ACETAMINOPHEN 325 MG PO TABS
ORAL_TABLET | ORAL | Status: AC
  Filled 2013-11-13: qty 2

## 2013-11-13 MED ORDER — DIPHENHYDRAMINE HCL 25 MG PO CAPS
25.0000 mg | ORAL_CAPSULE | Freq: Once | ORAL | Status: AC
  Administered 2013-11-13: 25 mg via ORAL

## 2013-11-13 MED ORDER — DIPHENHYDRAMINE HCL 25 MG PO CAPS
ORAL_CAPSULE | ORAL | Status: AC
  Filled 2013-11-13: qty 1

## 2013-11-13 NOTE — Patient Instructions (Signed)
[facility] Discharge Instructions for Patients Receiving Chemotherapy  Today you received the following chemotherapy agents: vidaza  To help prevent nausea and vomiting after your treatment, we encourage you to take your nausea medication.  Take it as often as prescribed.     If you develop nausea and vomiting that is not controlled by your nausea medication, call the clinic. If it is after clinic hours your family physician or the after hours number for the clinic or go to the Emergency Department.   BELOW ARE SYMPTOMS THAT SHOULD BE REPORTED IMMEDIATELY:  *FEVER GREATER THAN 100.5 F  *CHILLS WITH OR WITHOUT FEVER  NAUSEA AND VOMITING THAT IS NOT CONTROLLED WITH YOUR NAUSEA MEDICATION  *UNUSUAL SHORTNESS OF BREATH  *UNUSUAL BRUISING OR BLEEDING  TENDERNESS IN MOUTH AND THROAT WITH OR WITHOUT PRESENCE OF ULCERS  *URINARY PROBLEMS  *BOWEL PROBLEMS  UNUSUAL RASH Items with * indicate a potential emergency and should be followed up as soon as possible.  Feel free to call the clinic you have any questions or concerns. The clinic phone number is [phone].   I have been informed and understand all the instructions given to me. I know to contact the clinic, my physician, or go to the Emergency Department if any problems should occur. I do not have any questions at this time, but understand that I may call the clinic during office hours   should I have any questions or need assistance in obtaining follow up care.    __________________________________________  _____________  __________ Signature of Patient or Authorized Representative            Date                   Time    __________________________________________ Nurse's Signature   Blood Transfusion Information WHAT IS A BLOOD TRANSFUSION? A transfusion is the replacement of blood or some of its parts. Blood is made up of multiple cells which provide different functions.  Red blood cells carry  oxygen and are used for blood loss replacement.  White blood cells fight against infection.  Platelets control bleeding.  Plasma helps clot blood.  Other blood products are available for specialized needs, such as hemophilia or other clotting disorders. BEFORE THE TRANSFUSION  Who gives blood for transfusions?   You may be able to donate blood to be used at a later date on yourself (autologous donation).  Relatives can be asked to donate blood. This is generally not any safer than if you have received blood from a stranger. The same precautions are taken to ensure safety when a relative's blood is donated.  Healthy volunteers who are fully evaluated to make sure their blood is safe. This is blood bank blood. Transfusion therapy is the safest it has ever been in the practice of medicine. Before blood is taken from a donor, a complete history is taken to make sure that person has no history of diseases nor engages in risky social behavior (examples are intravenous drug use or sexual activity with multiple partners). The donor's travel history is screened to minimize risk of transmitting infections, such as malaria. The donated blood is tested for signs of infectious diseases, such as HIV and hepatitis. The blood is then tested to be sure it is compatible with you in order to minimize the chance of a transfusion reaction. If you or a relative donates blood, this is often done in anticipation of surgery and is not appropriate for emergency situations.  It takes many days to process the donated blood. RISKS AND COMPLICATIONS Although transfusion therapy is very safe and saves many lives, the main dangers of transfusion include:   Getting an infectious disease.  Developing a transfusion reaction. This is an allergic reaction to something in the blood you were given. Every precaution is taken to prevent this. The decision to have a blood transfusion has been considered carefully by your caregiver  before blood is given. Blood is not given unless the benefits outweigh the risks. AFTER THE TRANSFUSION  Right after receiving a blood transfusion, you will usually feel much better and more energetic. This is especially true if your red blood cells have gotten low (anemic). The transfusion raises the level of the red blood cells which carry oxygen, and this usually causes an energy increase.  The nurse administering the transfusion will monitor you carefully for complications. HOME CARE INSTRUCTIONS  No special instructions are needed after a transfusion. You may find your energy is better. Speak with your caregiver about any limitations on activity for underlying diseases you may have. SEEK MEDICAL CARE IF:   Your condition is not improving after your transfusion.  You develop redness or irritation at the intravenous (IV) site. SEEK IMMEDIATE MEDICAL CARE IF:  Any of the following symptoms occur over the next 12 hours:  Shaking chills.  You have a temperature by mouth above 102 F (38.9 C), not controlled by medicine.  Chest, back, or muscle pain.  People around you feel you are not acting correctly or are confused.  Shortness of breath or difficulty breathing.  Dizziness and fainting.  You get a rash or develop hives.  You have a decrease in urine output.  Your urine turns a dark color or changes to pink, red, or brown. Any of the following symptoms occur over the next 10 days:  You have a temperature by mouth above 102 F (38.9 C), not controlled by medicine.  Shortness of breath.  Weakness after normal activity.  The white part of the eye turns yellow (jaundice).  You have a decrease in the amount of urine or are urinating less often.  Your urine turns a dark color or changes to pink, red, or brown. Document Released: 09/18/2000 Document Revised: 12/14/2011 Document Reviewed: 05/07/2008 [facility] Patient Information 2014 [employer]

## 2013-11-13 NOTE — Progress Notes (Signed)
OK to treat with Hgb 7.6 per Dr. Alvy Bimler.  Pt to receive 1 unit blood today.

## 2013-11-14 ENCOUNTER — Ambulatory Visit (HOSPITAL_BASED_OUTPATIENT_CLINIC_OR_DEPARTMENT_OTHER): Payer: Medicare Other

## 2013-11-14 VITALS — BP 148/47 | HR 79 | Temp 97.7°F | Resp 20

## 2013-11-14 DIAGNOSIS — D46C Myelodysplastic syndrome with isolated del(5q) chromosomal abnormality: Secondary | ICD-10-CM

## 2013-11-14 DIAGNOSIS — Z5111 Encounter for antineoplastic chemotherapy: Secondary | ICD-10-CM

## 2013-11-14 LAB — TYPE AND SCREEN
ABO/RH(D): A POS
ANTIBODY SCREEN: NEGATIVE
Unit division: 0

## 2013-11-14 MED ORDER — ONDANSETRON HCL 8 MG PO TABS
8.0000 mg | ORAL_TABLET | Freq: Once | ORAL | Status: AC
  Administered 2013-11-14: 8 mg via ORAL

## 2013-11-14 MED ORDER — AZACITIDINE CHEMO SQ INJECTION
75.0000 mg/m2 | Freq: Once | INTRAMUSCULAR | Status: AC
  Administered 2013-11-14: 125 mg via SUBCUTANEOUS
  Filled 2013-11-14: qty 5

## 2013-11-14 MED ORDER — ONDANSETRON HCL 8 MG PO TABS
ORAL_TABLET | ORAL | Status: AC
  Filled 2013-11-14: qty 1

## 2013-11-14 NOTE — Patient Instructions (Signed)
[facility] Discharge Instructions for Patients Receiving Chemotherapy  Today you received the following chemotherapy agents vidaza.  To help prevent nausea and vomiting after your treatment, we encourage you to take your nausea medication as prescribed.   If you develop nausea and vomiting that is not controlled by your nausea medication, call the clinic.   BELOW ARE SYMPTOMS THAT SHOULD BE REPORTED IMMEDIATELY:  *FEVER GREATER THAN 100.5 F  *CHILLS WITH OR WITHOUT FEVER  NAUSEA AND VOMITING THAT IS NOT CONTROLLED WITH YOUR NAUSEA MEDICATION  *UNUSUAL SHORTNESS OF BREATH  *UNUSUAL BRUISING OR BLEEDING  TENDERNESS IN MOUTH AND THROAT WITH OR WITHOUT PRESENCE OF ULCERS  *URINARY PROBLEMS  *BOWEL PROBLEMS  UNUSUAL RASH Items with * indicate a potential emergency and should be followed up as soon as possible.  Feel free to call the clinic you have any questions or concerns. The clinic phone number is [phone].    

## 2013-11-20 ENCOUNTER — Other Ambulatory Visit (HOSPITAL_BASED_OUTPATIENT_CLINIC_OR_DEPARTMENT_OTHER): Payer: Medicare Other

## 2013-11-20 DIAGNOSIS — D649 Anemia, unspecified: Secondary | ICD-10-CM

## 2013-11-20 DIAGNOSIS — D46C Myelodysplastic syndrome with isolated del(5q) chromosomal abnormality: Secondary | ICD-10-CM

## 2013-11-20 LAB — COMPREHENSIVE METABOLIC PANEL (CC13)
ALBUMIN: 3.9 g/dL (ref 3.5–5.0)
ALK PHOS: 46 U/L (ref 40–150)
ALT: 20 U/L (ref 0–55)
AST: 12 U/L (ref 5–34)
Anion Gap: 10 mEq/L (ref 3–11)
BUN: 31.7 mg/dL — ABNORMAL HIGH (ref 7.0–26.0)
CO2: 27 mEq/L (ref 22–29)
CREATININE: 1 mg/dL (ref 0.6–1.1)
Calcium: 10.1 mg/dL (ref 8.4–10.4)
Chloride: 110 mEq/L — ABNORMAL HIGH (ref 98–109)
Glucose: 138 mg/dl (ref 70–140)
POTASSIUM: 3.3 meq/L — AB (ref 3.5–5.1)
Sodium: 146 mEq/L — ABNORMAL HIGH (ref 136–145)
Total Bilirubin: 0.92 mg/dL (ref 0.20–1.20)
Total Protein: 6.3 g/dL — ABNORMAL LOW (ref 6.4–8.3)

## 2013-11-20 LAB — CBC & DIFF AND RETIC
BASO%: 2.5 % — ABNORMAL HIGH (ref 0.0–2.0)
BASOS ABS: 0.2 10*3/uL — AB (ref 0.0–0.1)
EOS ABS: 0.8 10*3/uL — AB (ref 0.0–0.5)
EOS%: 12.5 % — ABNORMAL HIGH (ref 0.0–7.0)
HCT: 24.9 % — ABNORMAL LOW (ref 34.8–46.6)
HEMOGLOBIN: 8.1 g/dL — AB (ref 11.6–15.9)
Immature Retic Fract: 8.6 % (ref 1.60–10.00)
LYMPH#: 0.5 10*3/uL — AB (ref 0.9–3.3)
LYMPH%: 9 % — ABNORMAL LOW (ref 14.0–49.7)
MCH: 28.4 pg (ref 25.1–34.0)
MCHC: 32.5 g/dL (ref 31.5–36.0)
MCV: 87.4 fL (ref 79.5–101.0)
MONO#: 0 10*3/uL — ABNORMAL LOW (ref 0.1–0.9)
MONO%: 0.7 % (ref 0.0–14.0)
NEUT%: 75.3 % (ref 38.4–76.8)
NEUTROS ABS: 4.5 10*3/uL (ref 1.5–6.5)
NRBC: 0 % (ref 0–0)
Platelets: 191 10*3/uL (ref 145–400)
RBC: 2.85 10*6/uL — AB (ref 3.70–5.45)
RDW: 16.6 % — ABNORMAL HIGH (ref 11.2–14.5)
RETIC CT ABS: 27.08 10*3/uL — AB (ref 33.70–90.70)
Retic %: 0.95 % (ref 0.70–2.10)
WBC: 6 10*3/uL (ref 3.9–10.3)

## 2013-11-20 LAB — HOLD TUBE, BLOOD BANK

## 2013-11-27 ENCOUNTER — Other Ambulatory Visit (HOSPITAL_BASED_OUTPATIENT_CLINIC_OR_DEPARTMENT_OTHER): Payer: Medicare Other

## 2013-11-27 ENCOUNTER — Encounter (HOSPITAL_COMMUNITY): Payer: Self-pay | Admitting: Hematology

## 2013-11-27 ENCOUNTER — Non-Acute Institutional Stay (HOSPITAL_COMMUNITY)
Admission: AD | Admit: 2013-11-27 | Discharge: 2013-11-27 | Disposition: A | Discharge status: Home Health | Payer: Medicare Other | Source: Ambulatory Visit | Attending: Hematology and Oncology | Admitting: Hematology and Oncology

## 2013-11-27 ENCOUNTER — Other Ambulatory Visit: Payer: Self-pay | Admitting: Hematology and Oncology

## 2013-11-27 ENCOUNTER — Telehealth: Payer: Self-pay | Admitting: *Deleted

## 2013-11-27 DIAGNOSIS — D469 Myelodysplastic syndrome, unspecified: Secondary | ICD-10-CM | POA: Insufficient documentation

## 2013-11-27 DIAGNOSIS — D46C Myelodysplastic syndrome with isolated del(5q) chromosomal abnormality: Secondary | ICD-10-CM

## 2013-11-27 LAB — COMPREHENSIVE METABOLIC PANEL (CC13)
ALT: 14 U/L (ref 0–55)
ANION GAP: 12 meq/L — AB (ref 3–11)
AST: 10 U/L (ref 5–34)
Albumin: 3.7 g/dL (ref 3.5–5.0)
Alkaline Phosphatase: 45 U/L (ref 40–150)
BUN: 26.8 mg/dL — ABNORMAL HIGH (ref 7.0–26.0)
CALCIUM: 9.4 mg/dL (ref 8.4–10.4)
CO2: 24 mEq/L (ref 22–29)
CREATININE: 0.8 mg/dL (ref 0.6–1.1)
Chloride: 109 mEq/L (ref 98–109)
Glucose: 155 mg/dl — ABNORMAL HIGH (ref 70–140)
Potassium: 3.5 mEq/L (ref 3.5–5.1)
Sodium: 145 mEq/L (ref 136–145)
TOTAL PROTEIN: 6.3 g/dL — AB (ref 6.4–8.3)
Total Bilirubin: 1.24 mg/dL — ABNORMAL HIGH (ref 0.20–1.20)

## 2013-11-27 LAB — CBC & DIFF AND RETIC
BASO%: 7.3 % — AB (ref 0.0–2.0)
BASOS ABS: 0.2 10*3/uL — AB (ref 0.0–0.1)
EOS%: 23.6 % — ABNORMAL HIGH (ref 0.0–7.0)
Eosinophils Absolute: 0.5 10*3/uL (ref 0.0–0.5)
HEMATOCRIT: 20 % — AB (ref 34.8–46.6)
HEMOGLOBIN: 6.5 g/dL — AB (ref 11.6–15.9)
IMMATURE RETIC FRACT: 8.3 % (ref 1.60–10.00)
LYMPH#: 0.5 10*3/uL — AB (ref 0.9–3.3)
LYMPH%: 23.2 % (ref 14.0–49.7)
MCH: 28.6 pg (ref 25.1–34.0)
MCHC: 32.5 g/dL (ref 31.5–36.0)
MCV: 88.1 fL (ref 79.5–101.0)
MONO#: 0 10*3/uL — AB (ref 0.1–0.9)
MONO%: 0.5 % (ref 0.0–14.0)
NEUT%: 45.4 % (ref 38.4–76.8)
NEUTROS ABS: 1 10*3/uL — AB (ref 1.5–6.5)
RBC: 2.27 10*6/uL — ABNORMAL LOW (ref 3.70–5.45)
RDW: 17.7 % — ABNORMAL HIGH (ref 11.2–14.5)
RETIC CT ABS: 22.93 10*3/uL — AB (ref 33.70–90.70)
Retic %: 1.01 % (ref 0.70–2.10)
WBC: 2.2 10*3/uL — ABNORMAL LOW (ref 3.9–10.3)
nRBC: 0 % (ref 0–0)

## 2013-11-27 LAB — HOLD TUBE, BLOOD BANK

## 2013-11-27 LAB — PREPARE RBC (CROSSMATCH)

## 2013-11-27 LAB — TECHNOLOGIST REVIEW

## 2013-11-27 MED ORDER — ACETAMINOPHEN 325 MG PO TABS
650.0000 mg | ORAL_TABLET | Freq: Once | ORAL | Status: AC
  Administered 2013-11-27: 650 mg via ORAL
  Filled 2013-11-27: qty 2

## 2013-11-27 MED ORDER — FUROSEMIDE 10 MG/ML IJ SOLN
20.0000 mg | Freq: Once | INTRAMUSCULAR | Status: AC
  Administered 2013-11-27: 20 mg via INTRAVENOUS
  Filled 2013-11-27: qty 2

## 2013-11-27 MED ORDER — SODIUM CHLORIDE 0.9 % IJ SOLN: 3.0000 mL | INTRAMUSCULAR | Status: DC | PRN

## 2013-11-27 MED ORDER — HEPARIN SOD (PORK) LOCK FLUSH 100 UNIT/ML IV SOLN: 500.0000 [IU] | Freq: Every day | INTRAVENOUS | Status: DC | PRN

## 2013-11-27 MED ORDER — SODIUM CHLORIDE 0.9 % IJ SOLN: 10.0000 mL | INTRAMUSCULAR | Status: DC | PRN

## 2013-11-27 MED ORDER — DIPHENHYDRAMINE HCL 25 MG PO CAPS
25.0000 mg | ORAL_CAPSULE | Freq: Once | ORAL | Status: AC
  Administered 2013-11-27: 25 mg via ORAL
  Filled 2013-11-27: qty 1

## 2013-11-27 MED ORDER — SODIUM CHLORIDE 0.9 % IV SOLN: 250.0000 mL | Freq: Once | INTRAVENOUS | Status: DC

## 2013-11-27 MED ORDER — HEPARIN SOD (PORK) LOCK FLUSH 100 UNIT/ML IV SOLN: 250.0000 [IU] | INTRAVENOUS | Status: DC | PRN

## 2013-11-27 NOTE — H&P (Signed)
Patient is here for blood transfusion only 

## 2013-11-27 NOTE — Procedures (Signed)
[facility]  Procedure Note  Rebekah Cross MWU:[id number] DOB: March 12, 1925 DOA: 11/27/2013   Dr. Alvy Bimler  Associated Diagnosis: MDS  Procedure Note: IV started, patient received 2 units PRBCS   Condition During Procedure:tolerated well without any compliants   Condition at [place] stable at discharge, daughter at bedside.   Roberto Scales, RN  [facility]

## 2013-11-27 NOTE — Telephone Encounter (Signed)
Hgb 6.5 and Dr. Alvy Bimler ordered 2 units Blood transfused today.  No availability in our infusion room.  [facility] and made arrangements w/ Charlene to transfuse pt over there today.  Spoke w/ pt and daughter in lobby.  Instructed on proceeding to [facility] and gave them directions.  They verbalized understanding.  Notified Kennyth Lose in our lab of 2 units blood ordered and will be given at [facility] today.

## 2013-11-28 LAB — TYPE AND SCREEN
ABO/RH(D): A POS
ANTIBODY SCREEN: NEGATIVE
UNIT DIVISION: 0
UNIT DIVISION: 0

## 2013-12-01 ENCOUNTER — Telehealth: Payer: Self-pay | Admitting: *Deleted

## 2013-12-01 NOTE — Telephone Encounter (Signed)
[facility] left VM requesting Dx code for BCR/ABL.  Called back and left VM informing of Dx MDS w/ 5 q deletion.  Gave her our fax and phone number if she needs anything else.

## 2013-12-04 ENCOUNTER — Other Ambulatory Visit: Payer: Self-pay | Admitting: Hematology and Oncology

## 2013-12-04 ENCOUNTER — Other Ambulatory Visit (HOSPITAL_BASED_OUTPATIENT_CLINIC_OR_DEPARTMENT_OTHER): Payer: Medicare Other

## 2013-12-04 ENCOUNTER — Encounter: Payer: Self-pay | Admitting: *Deleted

## 2013-12-04 DIAGNOSIS — D46C Myelodysplastic syndrome with isolated del(5q) chromosomal abnormality: Secondary | ICD-10-CM

## 2013-12-04 LAB — CBC WITH DIFFERENTIAL/PLATELET
BASO%: 0 % (ref 0.0–2.0)
Basophils Absolute: 0 10*3/uL (ref 0.0–0.1)
EOS ABS: 0.9 10*3/uL — AB (ref 0.0–0.5)
EOS%: 56.9 % — ABNORMAL HIGH (ref 0.0–7.0)
HEMATOCRIT: 25 % — AB (ref 34.8–46.6)
HEMOGLOBIN: 8.1 g/dL — AB (ref 11.6–15.9)
LYMPH%: 35.4 % (ref 14.0–49.7)
MCH: 26.9 pg (ref 25.1–34.0)
MCHC: 32.6 g/dL (ref 31.5–36.0)
MCV: 82.6 fL (ref 79.5–101.0)
MONO#: 0.1 10*3/uL (ref 0.1–0.9)
MONO%: 6.5 % (ref 0.0–14.0)
NEUT#: 0 10*3/uL — CL (ref 1.5–6.5)
NEUT%: 1.2 % — ABNORMAL LOW (ref 38.4–76.8)
PLATELETS: 517 10*3/uL — AB (ref 145–400)
RBC: 3.02 10*6/uL — ABNORMAL LOW (ref 3.70–5.45)
RDW: 20.4 % — AB (ref 11.2–14.5)
WBC: 1.5 10*3/uL — ABNORMAL LOW (ref 3.9–10.3)
lymph#: 0.5 10*3/uL — ABNORMAL LOW (ref 0.9–3.3)

## 2013-12-04 LAB — TECHNOLOGIST REVIEW

## 2013-12-04 LAB — HOLD TUBE, BLOOD BANK

## 2013-12-04 NOTE — Progress Notes (Signed)
Dr. Alvy Bimler reviewed CBC and no need for transfusion today.  Informed pt and her daughter of results and no need for transfusion.  Keep appt as scheduled next week.  Pt states she feels fine today. Call if any changes in condition or concerns this week. They verbalized understanding.

## 2013-12-05 ENCOUNTER — Telehealth: Payer: Self-pay | Admitting: *Deleted

## 2013-12-05 NOTE — Telephone Encounter (Signed)
Ok per Dr. Alvy Bimler for pt to start the Cipro.  Instructed dau for pt to start the Cipro and once she does, she should complete the course.  She verbalized understanding.

## 2013-12-05 NOTE — Telephone Encounter (Signed)
Daughter reports pt has congestion and "constant" runny nose,  She has been around her great grandchildren who also have runny noses.  Denies fevers.  Dau asks if pt should start Cipro?  They have a Rx at home Dr. Alvy Bimler had instructed pt to get filled and have on hand.

## 2013-12-11 ENCOUNTER — Ambulatory Visit (HOSPITAL_BASED_OUTPATIENT_CLINIC_OR_DEPARTMENT_OTHER): Payer: Medicare Other | Admitting: Hematology and Oncology

## 2013-12-11 ENCOUNTER — Telehealth: Payer: Self-pay | Admitting: Hematology and Oncology

## 2013-12-11 ENCOUNTER — Non-Acute Institutional Stay (HOSPITAL_COMMUNITY)
Admission: AD | Admit: 2013-12-11 | Discharge: 2013-12-11 | Disposition: A | Discharge status: Home / Self Care | Payer: Medicare Other | Source: Ambulatory Visit | Attending: Hematology and Oncology | Admitting: Hematology and Oncology

## 2013-12-11 ENCOUNTER — Encounter: Payer: Self-pay | Admitting: Hematology and Oncology

## 2013-12-11 ENCOUNTER — Other Ambulatory Visit (HOSPITAL_BASED_OUTPATIENT_CLINIC_OR_DEPARTMENT_OTHER): Payer: Medicare Other

## 2013-12-11 ENCOUNTER — Ambulatory Visit (HOSPITAL_BASED_OUTPATIENT_CLINIC_OR_DEPARTMENT_OTHER): Payer: Medicare Other

## 2013-12-11 ENCOUNTER — Ambulatory Visit (HOSPITAL_COMMUNITY)
Admission: RE | Admit: 2013-12-11 | Discharge: 2013-12-11 | Disposition: A | Discharge status: Home / Self Care | Payer: Medicare Other | Source: Ambulatory Visit | Attending: Hematology and Oncology | Admitting: Hematology and Oncology

## 2013-12-11 ENCOUNTER — Ambulatory Visit: Payer: Medicare Other

## 2013-12-11 VITALS — BP 154/44 | HR 85 | Temp 97.9°F | Resp 18 | Ht 62.0 in | Wt 140.1 lb

## 2013-12-11 DIAGNOSIS — D649 Anemia, unspecified: Secondary | ICD-10-CM

## 2013-12-11 DIAGNOSIS — D46C Myelodysplastic syndrome with isolated del(5q) chromosomal abnormality: Secondary | ICD-10-CM

## 2013-12-11 DIAGNOSIS — Z5111 Encounter for antineoplastic chemotherapy: Secondary | ICD-10-CM

## 2013-12-11 DIAGNOSIS — L299 Pruritus, unspecified: Secondary | ICD-10-CM

## 2013-12-11 DIAGNOSIS — D709 Neutropenia, unspecified: Secondary | ICD-10-CM

## 2013-12-11 DIAGNOSIS — D630 Anemia in neoplastic disease: Secondary | ICD-10-CM

## 2013-12-11 DIAGNOSIS — D469 Myelodysplastic syndrome, unspecified: Secondary | ICD-10-CM

## 2013-12-11 LAB — COMPREHENSIVE METABOLIC PANEL (CC13)
ALT: 19 U/L (ref 0–55)
ANION GAP: 10 meq/L (ref 3–11)
AST: 12 U/L (ref 5–34)
Albumin: 3.7 g/dL (ref 3.5–5.0)
Alkaline Phosphatase: 70 U/L (ref 40–150)
BUN: 21.8 mg/dL (ref 7.0–26.0)
CO2: 26 meq/L (ref 22–29)
CREATININE: 0.8 mg/dL (ref 0.6–1.1)
Calcium: 9.2 mg/dL (ref 8.4–10.4)
Chloride: 110 mEq/L — ABNORMAL HIGH (ref 98–109)
Glucose: 151 mg/dl — ABNORMAL HIGH (ref 70–140)
Potassium: 3.2 mEq/L — ABNORMAL LOW (ref 3.5–5.1)
Sodium: 145 mEq/L (ref 136–145)
Total Bilirubin: 0.62 mg/dL (ref 0.20–1.20)
Total Protein: 6.6 g/dL (ref 6.4–8.3)

## 2013-12-11 LAB — CBC WITH DIFFERENTIAL/PLATELET
BASO%: 3 % — ABNORMAL HIGH (ref 0.0–2.0)
Basophils Absolute: 0.1 10e3/uL (ref 0.0–0.1)
EOS%: 17.9 % — ABNORMAL HIGH (ref 0.0–7.0)
Eosinophils Absolute: 0.3 10e3/uL (ref 0.0–0.5)
HCT: 21.9 % — ABNORMAL LOW (ref 34.8–46.6)
HGB: 7.1 g/dL — ABNORMAL LOW (ref 11.6–15.9)
LYMPH%: 22.6 % (ref 14.0–49.7)
MCH: 26.2 pg (ref 25.1–34.0)
MCHC: 32.4 g/dL (ref 31.5–36.0)
MCV: 80.8 fL (ref 79.5–101.0)
MONO#: 0.1 10e3/uL (ref 0.1–0.9)
MONO%: 5.4 % (ref 0.0–14.0)
NEUT#: 0.9 10e3/uL — ABNORMAL LOW (ref 1.5–6.5)
NEUT%: 51.1 % (ref 38.4–76.8)
Platelets: 333 10e3/uL (ref 145–400)
RBC: 2.71 10e6/uL — ABNORMAL LOW (ref 3.70–5.45)
RDW: 18.9 % — ABNORMAL HIGH (ref 11.2–14.5)
WBC: 1.7 10e3/uL — ABNORMAL LOW (ref 3.9–10.3)
lymph#: 0.4 10e3/uL — ABNORMAL LOW (ref 0.9–3.3)

## 2013-12-11 LAB — HOLD TUBE, BLOOD BANK

## 2013-12-11 LAB — TECHNOLOGIST REVIEW

## 2013-12-11 MED ORDER — SODIUM CHLORIDE 0.9 % IJ SOLN: 10.0000 mL | INTRAMUSCULAR | Status: DC | PRN

## 2013-12-11 MED ORDER — ONDANSETRON HCL 8 MG PO TABS
ORAL_TABLET | ORAL | Status: AC
  Filled 2013-12-11: qty 1

## 2013-12-11 MED ORDER — HEPARIN SOD (PORK) LOCK FLUSH 100 UNIT/ML IV SOLN: 500.0000 [IU] | Freq: Every day | INTRAVENOUS | Status: DC | PRN

## 2013-12-11 MED ORDER — AZACITIDINE CHEMO SQ INJECTION
75.0000 mg/m2 | Freq: Once | INTRAMUSCULAR | Status: AC
  Administered 2013-12-11: 125 mg via SUBCUTANEOUS
  Filled 2013-12-11: qty 5

## 2013-12-11 MED ORDER — SODIUM CHLORIDE 0.9 % IV SOLN
250.0000 mL | Freq: Once | INTRAVENOUS | Status: AC
  Administered 2013-12-11: 250 mL via INTRAVENOUS

## 2013-12-11 MED ORDER — ACETAMINOPHEN 325 MG PO TABS
650.0000 mg | ORAL_TABLET | Freq: Once | ORAL | Status: AC
  Administered 2013-12-11: 650 mg via ORAL
  Filled 2013-12-11: qty 2

## 2013-12-11 MED ORDER — SODIUM CHLORIDE 0.9 % IJ SOLN: 3.0000 mL | INTRAMUSCULAR | Status: DC | PRN

## 2013-12-11 MED ORDER — HEPARIN SOD (PORK) LOCK FLUSH 100 UNIT/ML IV SOLN: 250.0000 [IU] | INTRAVENOUS | Status: DC | PRN

## 2013-12-11 MED ORDER — CIPROFLOXACIN HCL 500 MG PO TABS: 500.0000 mg | ORAL_TABLET | Freq: Two times a day (BID) | ORAL | Status: DC

## 2013-12-11 MED ORDER — DIPHENHYDRAMINE HCL 25 MG PO CAPS
25.0000 mg | ORAL_CAPSULE | Freq: Once | ORAL | Status: AC
  Administered 2013-12-11: 25 mg via ORAL
  Filled 2013-12-11: qty 1

## 2013-12-11 MED ORDER — SODIUM CHLORIDE 0.9 % IV SOLN: 250.0000 mL | Freq: Once | INTRAVENOUS | Status: DC

## 2013-12-11 MED ORDER — ONDANSETRON HCL 8 MG PO TABS
8.0000 mg | ORAL_TABLET | Freq: Once | ORAL | Status: AC
  Administered 2013-12-11: 8 mg via ORAL

## 2013-12-11 MED ORDER — FUROSEMIDE 10 MG/ML IJ SOLN
20.0000 mg | Freq: Once | INTRAMUSCULAR | Status: AC
  Administered 2013-12-11: 20 mg via INTRAVENOUS
  Filled 2013-12-11: qty 2

## 2013-12-11 NOTE — H&P (Signed)
The patient is here for blood transfusion only. 

## 2013-12-11 NOTE — Procedures (Signed)
[facility]  Procedure Note  Rebekah Cross EBX:[id number] DOB: Nov 07, 1924 DOA: 12/11/2013   PCP: Dr. Alvy Bimler   Associated Diagnosis: Mds with 5q deletion, Anemia   Procedure Note:  2units PRBC's   Condition During Procedure: patient tolerated blood transfusion without any signs of reaction, denies any complaints   Condition at Discharge: stable at discharge   Roberto Scales, [facility]

## 2013-12-11 NOTE — Progress Notes (Signed)
Called and spoke with [place], dr. Alvy Bimler nurse to clarify the lasix order.  Per RN follow the order how it was written which is to give lasix prior to transfusion.

## 2013-12-11 NOTE — Telephone Encounter (Signed)
gv and printed appt sched and avs for pt for march and April....sed added tx. °

## 2013-12-11 NOTE — Patient Instructions (Signed)
[facility] Discharge Instructions for Patients Receiving Chemotherapy  Today you received the following chemotherapy agents Vidaza.   To help prevent nausea and vomiting after your treatment, we encourage you to take your nausea medication as prescribed.    If you develop nausea and vomiting that is not controlled by your nausea medication, call the clinic.   BELOW ARE SYMPTOMS THAT SHOULD BE REPORTED IMMEDIATELY:  *FEVER GREATER THAN 100.5 F  *CHILLS WITH OR WITHOUT FEVER  NAUSEA AND VOMITING THAT IS NOT CONTROLLED WITH YOUR NAUSEA MEDICATION  *UNUSUAL SHORTNESS OF BREATH  *UNUSUAL BRUISING OR BLEEDING  TENDERNESS IN MOUTH AND THROAT WITH OR WITHOUT PRESENCE OF ULCERS  *URINARY PROBLEMS  *BOWEL PROBLEMS  UNUSUAL RASH Items with * indicate a potential emergency and should be followed up as soon as possible.  Feel free to call the clinic should you have any questions or concerns. The clinic phone number is [phone].  It was my pleasure to take care of you today!  Tina Kylin Dubs, RN    

## 2013-12-11 NOTE — Progress Notes (Signed)
[facility] OFFICE PROGRESS NOTE  Patient Care Team: Mayra Neer, MD as PCP - General (Family Medicine) Heath Lark, MD as Consulting Physician (Hematology and Oncology)  DIAGNOSIS: Myelodysplastic syndrome, ongoing treatment  SUMMARY OF ONCOLOGIC HISTORY: This is a very pleasant 80 year old lady who become transfusion dependent over the last 3 months. She is transfusion dependent. She denies any bleeding such as epistaxis, hematuria, or hematochezia. 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 Erythropoietin stimulating agents were stopped when her erythropoietin level came back over 500 In October 2014, she was started on Revlimid however develop severe allergic reaction to Revlimid. That was discontinued. On 08/07/2013, we started her on Vidaza for 2 cycles. On 10/04/2013, repeat a bone marrow aspirate and biopsy showed persistent disease. Additional testing revealed that her disease is JAK 2 positive On 11/06/2013, Vidaza is resumed after her second opinion from [place]: Rebekah Cross 80 y.o. female returns for further followup. She continued to have fatigue. Her skin itching is persistent. Today she complained of fatigue but denies chest pain, shortness of breath or dizziness. Recently, she developed mild respiratory tract infection. She started taking ciprofloxacin and has improvement of her symptoms. Denies any recent nausea or diarrhea.  I have reviewed the past medical history, past surgical history, social history and family history with the patient and they are unchanged from previous note.  ALLERGIES:  is allergic to novocain; oxycodone; codeine; methimazole; and revlimid.  MEDICATIONS:  Current Outpatient Prescriptions  Medication Sig Dispense Refill  . acetaminophen (TYLENOL) 500 MG tablet Take 1,000 mg by mouth every 6 (six) hours as  needed for fever.      Marland Kitchen aspirin 81 MG tablet Take 81 mg by mouth every morning.       . cholecalciferol (VITAMIN D) 400 UNITS TABS Take 400 Units by mouth every morning.       . ciprofloxacin (CIPRO) 500 MG tablet Take 1 tablet (500 mg total) by mouth 2 (two) times daily.  14 tablet  0  . hydrOXYzine (ATARAX/VISTARIL) 25 MG tablet TAKE 1 TABLET BY MOUTH EVERY 4 HOURS AS NEEDED FOR ITCHING  60 tablet  3  . LORazepam (ATIVAN) 0.5 MG tablet Take 0.5 mg by mouth at bedtime.       . metoprolol succinate (TOPROL-XL) 100 MG 24 hr tablet Take 50 mg by mouth daily.       . mometasone (ELOCON) 0.1 % cream Apply 1 application topically daily as needed (psoriasis).       . Multiple Vitamin (MULTIVITAMIN) tablet Take 1 tablet by mouth every morning.       . nisoldipine (SULAR) 25.5 MG 24 hr tablet Take 25.5 mg by mouth every morning.       . olmesartan-hydrochlorothiazide (BENICAR HCT) 40-25 MG per tablet Take 1 tablet by mouth every morning.       . ondansetron (ZOFRAN) 8 MG tablet Take 8 mg by mouth every 8 (eight) hours as needed for nausea or vomiting.       . potassium chloride SA (K-DUR,KLOR-CON) 20 MEQ tablet Take 20 mEq by mouth every morning.       . pravastatin (PRAVACHOL) 40 MG tablet Take 40 mg by mouth at bedtime.       . predniSONE (DELTASONE) 10 MG tablet Take 5 mg by mouth daily with breakfast.      . prochlorperazine (COMPAZINE) 10 MG  tablet TAKE 1 TABLET BY MOUTH EVERY 6 HOURS AS NEEDED FOR NAUSEA OR VOMITING  30 tablet  2  . triamcinolone cream (KENALOG) 0.1 % Apply 1 application topically daily as needed (psoriasis).        No current facility-administered medications for this visit.   Facility-Administered Medications Ordered in Other Visits  Medication Dose Route Frequency Provider Last Rate Last Dose  . azaCITIDine (VIDAZA) chemo injection 125 mg  75 mg/m2 (Treatment Plan Actual) Subcutaneous Once Heath Lark, MD      . ondansetron (ZOFRAN) tablet 8 mg  8 mg Oral Once Heath Lark, MD       . sodium chloride 0.9 % injection 3 mL  3 mL Intracatheter PRN Heath Lark, MD        REVIEW OF SYSTEMS:   Constitutional: Denies fevers, chills or abnormal weight loss Eyes: Denies blurriness of vision Ears, nose, mouth, throat, and face: Denies mucositis or sore throat Respiratory: Denies cough, dyspnea or wheezes Cardiovascular: Denies palpitation, chest discomfort or lower extremity swelling Gastrointestinal:  Denies nausea, heartburn or change in bowel habits Lymphatics: Denies new lymphadenopathy or easy bruising Neurological:Denies numbness, tingling or new weaknesses Behavioral/Psych: Mood is stable, no new changes  All other systems were reviewed with the patient and are negative.  PHYSICAL EXAMINATION: ECOG PERFORMANCE STATUS: 1 - Symptomatic but completely ambulatory  Filed Vitals:   12/11/13 0858  BP: 154/44  Pulse: 85  Temp: 97.9 F (36.6 C)  Resp: 18   Filed Weights   12/11/13 0858  Weight: 140 lb 1.6 oz (63.549 kg)    GENERAL:alert, no distress and comfortable SKIN: Mild papular rash throughout the skin, unchanged EYES: normal, Conjunctiva are pale and non-injected, sclera clear OROPHARYNX:no exudate, no erythema and lips, buccal mucosa, and tongue normal  NECK: supple, thyroid normal size, non-tender, without nodularity LYMPH:  no palpable lymphadenopathy in the cervical, axillary or inguinal LUNGS: clear to auscultation and percussion with normal breathing effort HEART: regular rate & rhythm and no murmurs and no lower extremity edema ABDOMEN:abdomen soft, non-tender and normal bowel sounds Musculoskeletal:no cyanosis of digits and no clubbing  NEURO: alert & oriented x 3 with fluent speech, no focal motor/sensory deficits  LABORATORY DATA:  I have reviewed the data as listed    Component Value Date/Time   NA 145 11/27/2013 0942   NA 136 08/27/2013 1212   K 3.5 11/27/2013 0942   K 3.4* 08/27/2013 1212   CL 102 08/27/2013 1212   CO2 24 11/27/2013  0942   CO2 23 08/27/2013 1212   GLUCOSE 155* 11/27/2013 0942   GLUCOSE 118* 08/27/2013 1212   BUN 26.8* 11/27/2013 0942   BUN 23 08/27/2013 1212   CREATININE 0.8 11/27/2013 0942   CREATININE 0.78 08/27/2013 1212   CALCIUM 9.4 11/27/2013 0942   CALCIUM 9.2 08/27/2013 1212   PROT 6.3* 11/27/2013 0942   PROT 6.6 08/27/2013 1212   ALBUMIN 3.7 11/27/2013 0942   ALBUMIN 3.8 08/27/2013 1212   AST 10 11/27/2013 0942   AST 17 08/27/2013 1212   ALT 14 11/27/2013 0942   ALT 25 08/27/2013 1212   ALKPHOS 45 11/27/2013 0942   ALKPHOS 56 08/27/2013 1212   BILITOT 1.24* 11/27/2013 0942   BILITOT 1.9* 08/27/2013 1212   GFRNONAA 74* 08/27/2013 1212   GFRAA 85* 08/27/2013 1212    No results found for this basename: SPEP,  UPEP,   kappa and lambda light chains    Lab Results  Component Value Date   WBC  1.7* 12/11/2013   NEUTROABS 0.9* 12/11/2013   HGB 7.1* 12/11/2013   HCT 21.9* 12/11/2013   MCV 80.8 12/11/2013   PLT 333 12/11/2013      Chemistry      Component Value Date/Time   NA 145 11/27/2013 0942   NA 136 08/27/2013 1212   K 3.5 11/27/2013 0942   K 3.4* 08/27/2013 1212   CL 102 08/27/2013 1212   CO2 24 11/27/2013 0942   CO2 23 08/27/2013 1212   BUN 26.8* 11/27/2013 0942   BUN 23 08/27/2013 1212   CREATININE 0.8 11/27/2013 0942   CREATININE 0.78 08/27/2013 1212      Component Value Date/Time   CALCIUM 9.4 11/27/2013 0942   CALCIUM 9.2 08/27/2013 1212   ALKPHOS 45 11/27/2013 0942   ALKPHOS 56 08/27/2013 1212   AST 10 11/27/2013 0942   AST 17 08/27/2013 1212   ALT 14 11/27/2013 0942   ALT 25 08/27/2013 1212   BILITOT 1.24* 11/27/2013 0942   BILITOT 1.9* 08/27/2013 1212     ASSESSMENT & PLAN:  #1 myelodysplastic syndrome Per recommendation from [facility], we'll resume treatment with Vidaza however we will be giving her 7 days injection instead of 5 days injection. I will start to initiate prednisone taper to 5 mg to every other day and we'll plan to wean her off prednisone next month Even  though she has severe pancytopenia, I will recommend proceeding with chemotherapy without delay or dose adjustment. My plan would be to repeat bone marrow aspirate and biopsy after her next cycle of treatment. #2 severe anemia We discussed some of the risks, benefits, and alternatives of blood transfusions. The patient is symptomatic from anemia and the hemoglobin level is critically low.  Some of the side-effects to be expected including risks of transfusion reactions, chills, infection, syndrome of volume overload and risk of hospitalization from various reasons and the patient is willing to proceed  My goal would be to keep a hemoglobin greater than 8 g and we will be transfusing 2 units at each time. I have made lab appointment for her to come by every week to check on this. #3 severe skin itching This is due to her disease. She will continue to use over-the-counter medicine for this. #4 severe neutropenia This is likely due to recent treatment. The patient denies recent history of fevers, cough, chills, diarrhea or dysuria. She is asymptomatic from the leukopenia. I will observe for now.  I will continue the chemotherapy at current dose without dosage adjustment.  If the leukopenia gets progressive worse in the future, I might have to delay her treatment or adjust the chemotherapy dose. #5 recent upper GI tract infection Overall she is improving. I recommend she continue the ciprofloxacin prescription until she has finished.   Orders Placed This Encounter  Procedures  . Comprehensive metabolic panel    Standing Status: Future     Number of Occurrences:      Standing Expiration Date: 12/11/2014   All questions were answered. The patient knows to call the clinic with any problems, questions or concerns. No barriers to learning was detected. I spent 40 minutes counseling the patient face to face. The total time spent in the appointment was 60 minutes and more than 50% was on counseling and  review of test results     [facility], [place], MD 12/11/2013 9:58 AM

## 2013-12-12 ENCOUNTER — Ambulatory Visit (HOSPITAL_BASED_OUTPATIENT_CLINIC_OR_DEPARTMENT_OTHER): Payer: Medicare Other

## 2013-12-12 VITALS — BP 143/89 | HR 75 | Temp 98.3°F | Resp 18

## 2013-12-12 DIAGNOSIS — Z5111 Encounter for antineoplastic chemotherapy: Secondary | ICD-10-CM

## 2013-12-12 DIAGNOSIS — D46C Myelodysplastic syndrome with isolated del(5q) chromosomal abnormality: Secondary | ICD-10-CM

## 2013-12-12 LAB — TYPE AND SCREEN
ABO/RH(D): A POS
Antibody Screen: NEGATIVE
UNIT DIVISION: 0
Unit division: 0

## 2013-12-12 LAB — PREPARE RBC (CROSSMATCH)

## 2013-12-12 MED ORDER — ONDANSETRON HCL 8 MG PO TABS
ORAL_TABLET | ORAL | Status: AC
  Filled 2013-12-12: qty 1

## 2013-12-12 MED ORDER — ONDANSETRON HCL 8 MG PO TABS
8.0000 mg | ORAL_TABLET | Freq: Once | ORAL | Status: AC
  Administered 2013-12-12: 8 mg via ORAL

## 2013-12-12 MED ORDER — AZACITIDINE CHEMO SQ INJECTION
75.0000 mg/m2 | Freq: Once | INTRAMUSCULAR | Status: AC
Start: 2013-12-12 — End: 2013-12-12
  Administered 2013-12-12: 125 mg via SUBCUTANEOUS
  Filled 2013-12-12: qty 5

## 2013-12-12 NOTE — Patient Instructions (Signed)
[facility] Discharge Instructions for Patients Receiving Chemotherapy  Today you received the following chemotherapy agents: vidaza  To help prevent nausea and vomiting after your treatment, we encourage you to take your nausea medication.  Take it as often as prescribed.     If you develop nausea and vomiting that is not controlled by your nausea medication, call the clinic. If it is after clinic hours your family physician or the after hours number for the clinic or go to the Emergency Department.   BELOW ARE SYMPTOMS THAT SHOULD BE REPORTED IMMEDIATELY:  *FEVER GREATER THAN 100.5 F  *CHILLS WITH OR WITHOUT FEVER  NAUSEA AND VOMITING THAT IS NOT CONTROLLED WITH YOUR NAUSEA MEDICATION  *UNUSUAL SHORTNESS OF BREATH  *UNUSUAL BRUISING OR BLEEDING  TENDERNESS IN MOUTH AND THROAT WITH OR WITHOUT PRESENCE OF ULCERS  *URINARY PROBLEMS  *BOWEL PROBLEMS  UNUSUAL RASH Items with * indicate a potential emergency and should be followed up as soon as possible.  Feel free to call the clinic you have any questions or concerns. The clinic phone number is [phone].   I have been informed and understand all the instructions given to me. I know to contact the clinic, my physician, or go to the Emergency Department if any problems should occur. I do not have any questions at this time, but understand that I may call the clinic during office hours   should I have any questions or need assistance in obtaining follow up care.    __________________________________________  _____________  __________ Signature of Patient or Authorized Representative            Date                   Time    __________________________________________ Nurse's Signature    

## 2013-12-13 ENCOUNTER — Ambulatory Visit (HOSPITAL_BASED_OUTPATIENT_CLINIC_OR_DEPARTMENT_OTHER): Payer: Medicare Other

## 2013-12-13 VITALS — BP 140/58 | HR 70 | Temp 97.0°F | Resp 20

## 2013-12-13 DIAGNOSIS — Z5111 Encounter for antineoplastic chemotherapy: Secondary | ICD-10-CM

## 2013-12-13 DIAGNOSIS — D46C Myelodysplastic syndrome with isolated del(5q) chromosomal abnormality: Secondary | ICD-10-CM

## 2013-12-13 MED ORDER — AZACITIDINE CHEMO SQ INJECTION
75.0000 mg/m2 | Freq: Once | INTRAMUSCULAR | Status: AC
  Administered 2013-12-13: 125 mg via SUBCUTANEOUS
  Filled 2013-12-13: qty 5

## 2013-12-13 MED ORDER — ONDANSETRON HCL 8 MG PO TABS
ORAL_TABLET | ORAL | Status: AC
  Filled 2013-12-13: qty 1

## 2013-12-13 MED ORDER — ONDANSETRON HCL 8 MG PO TABS
8.0000 mg | ORAL_TABLET | Freq: Once | ORAL | Status: AC
  Administered 2013-12-13: 8 mg via ORAL

## 2013-12-13 NOTE — Patient Instructions (Signed)
[facility] Discharge Instructions for Patients Receiving Chemotherapy  Today you received the following chemotherapy agent: Vidaza   To help prevent nausea and vomiting after your treatment, we encourage you to take your nausea medication as prescribed.    If you develop nausea and vomiting that is not controlled by your nausea medication, call the clinic.   BELOW ARE SYMPTOMS THAT SHOULD BE REPORTED IMMEDIATELY:  *FEVER GREATER THAN 100.5 F  *CHILLS WITH OR WITHOUT FEVER  NAUSEA AND VOMITING THAT IS NOT CONTROLLED WITH YOUR NAUSEA MEDICATION  *UNUSUAL SHORTNESS OF BREATH  *UNUSUAL BRUISING OR BLEEDING  TENDERNESS IN MOUTH AND THROAT WITH OR WITHOUT PRESENCE OF ULCERS  *URINARY PROBLEMS  *BOWEL PROBLEMS  UNUSUAL RASH Items with * indicate a potential emergency and should be followed up as soon as possible.  Feel free to call the clinic you have any questions or concerns. The clinic phone number is [phone].    

## 2013-12-14 ENCOUNTER — Telehealth: Payer: Self-pay | Admitting: *Deleted

## 2013-12-14 ENCOUNTER — Ambulatory Visit (HOSPITAL_BASED_OUTPATIENT_CLINIC_OR_DEPARTMENT_OTHER): Payer: Medicare Other

## 2013-12-14 VITALS — BP 412/53 | HR 72 | Temp 97.1°F | Resp 20

## 2013-12-14 DIAGNOSIS — D46C Myelodysplastic syndrome with isolated del(5q) chromosomal abnormality: Secondary | ICD-10-CM

## 2013-12-14 DIAGNOSIS — Z5111 Encounter for antineoplastic chemotherapy: Secondary | ICD-10-CM

## 2013-12-14 MED ORDER — AZACITIDINE CHEMO SQ INJECTION
75.0000 mg/m2 | Freq: Once | INTRAMUSCULAR | Status: AC
  Administered 2013-12-14: 125 mg via SUBCUTANEOUS
  Filled 2013-12-14: qty 5

## 2013-12-14 NOTE — Telephone Encounter (Signed)
Informed Nikki at Dr. Ledell Peoples office of Dr. Calton Dach reply that Soriatane not recommended and to use topical only if possible.  She will give message to Dr. Allyson Sabal.

## 2013-12-14 NOTE — Telephone Encounter (Signed)
Dr. Allyson Sabal wants to know if its ok for pt to start on Soriatane 25 mg daily for psoriasis?

## 2013-12-14 NOTE — Telephone Encounter (Signed)
No, not recommended Topical stuff only if possible

## 2013-12-15 ENCOUNTER — Ambulatory Visit (HOSPITAL_BASED_OUTPATIENT_CLINIC_OR_DEPARTMENT_OTHER): Payer: Medicare Other

## 2013-12-15 VITALS — BP 135/46 | HR 71 | Temp 97.9°F | Resp 18

## 2013-12-15 DIAGNOSIS — D46C Myelodysplastic syndrome with isolated del(5q) chromosomal abnormality: Secondary | ICD-10-CM

## 2013-12-15 DIAGNOSIS — Z5111 Encounter for antineoplastic chemotherapy: Secondary | ICD-10-CM

## 2013-12-15 MED ORDER — ONDANSETRON HCL 8 MG PO TABS
8.0000 mg | ORAL_TABLET | Freq: Once | ORAL | Status: AC
  Administered 2013-12-15: 8 mg via ORAL

## 2013-12-15 MED ORDER — ONDANSETRON HCL 8 MG PO TABS
ORAL_TABLET | ORAL | Status: AC
  Filled 2013-12-15: qty 1

## 2013-12-15 MED ORDER — AZACITIDINE CHEMO SQ INJECTION
75.0000 mg/m2 | Freq: Once | INTRAMUSCULAR | Status: AC
  Administered 2013-12-15: 125 mg via SUBCUTANEOUS
  Filled 2013-12-15: qty 5

## 2013-12-15 NOTE — Patient Instructions (Signed)
[facility] Discharge Instructions for Patients Receiving Chemotherapy  Today you received the following chemotherapy agents Vidaza. To help prevent nausea and vomiting after your treatment, we encourage you to take your nausea medication as prescribed.    If you develop nausea and vomiting that is not controlled by your nausea medication, call the clinic.   BELOW ARE SYMPTOMS THAT SHOULD BE REPORTED IMMEDIATELY:  *FEVER GREATER THAN 100.5 F  *CHILLS WITH OR WITHOUT FEVER  NAUSEA AND VOMITING THAT IS NOT CONTROLLED WITH YOUR NAUSEA MEDICATION  *UNUSUAL SHORTNESS OF BREATH  *UNUSUAL BRUISING OR BLEEDING  TENDERNESS IN MOUTH AND THROAT WITH OR WITHOUT PRESENCE OF ULCERS  *URINARY PROBLEMS  *BOWEL PROBLEMS  UNUSUAL RASH Items with * indicate a potential emergency and should be followed up as soon as possible.  Feel free to call the clinic you have any questions or concerns. The clinic phone number is [phone].    

## 2013-12-18 ENCOUNTER — Other Ambulatory Visit (HOSPITAL_BASED_OUTPATIENT_CLINIC_OR_DEPARTMENT_OTHER): Payer: Medicare Other

## 2013-12-18 ENCOUNTER — Ambulatory Visit (HOSPITAL_BASED_OUTPATIENT_CLINIC_OR_DEPARTMENT_OTHER): Payer: Medicare Other

## 2013-12-18 VITALS — BP 156/76 | HR 75 | Temp 97.6°F | Resp 18

## 2013-12-18 DIAGNOSIS — D46C Myelodysplastic syndrome with isolated del(5q) chromosomal abnormality: Secondary | ICD-10-CM

## 2013-12-18 DIAGNOSIS — Z5111 Encounter for antineoplastic chemotherapy: Secondary | ICD-10-CM

## 2013-12-18 LAB — CBC WITH DIFFERENTIAL/PLATELET
BASO%: 1.7 % (ref 0.0–2.0)
Basophils Absolute: 0 10*3/uL (ref 0.0–0.1)
EOS ABS: 0.1 10*3/uL (ref 0.0–0.5)
EOS%: 6.9 % (ref 0.0–7.0)
HCT: 26.5 % — ABNORMAL LOW (ref 34.8–46.6)
HGB: 8.8 g/dL — ABNORMAL LOW (ref 11.6–15.9)
LYMPH%: 22.3 % (ref 14.0–49.7)
MCH: 27 pg (ref 25.1–34.0)
MCHC: 33.2 g/dL (ref 31.5–36.0)
MCV: 81.3 fL (ref 79.5–101.0)
MONO#: 0.1 10*3/uL (ref 0.1–0.9)
MONO%: 3.4 % (ref 0.0–14.0)
NEUT#: 1.2 10*3/uL — ABNORMAL LOW (ref 1.5–6.5)
NEUT%: 65.7 % (ref 38.4–76.8)
PLATELETS: 100 10*3/uL — AB (ref 145–400)
RBC: 3.26 10*6/uL — AB (ref 3.70–5.45)
RDW: 16.8 % — ABNORMAL HIGH (ref 11.2–14.5)
WBC: 1.8 10*3/uL — ABNORMAL LOW (ref 3.9–10.3)
lymph#: 0.4 10*3/uL — ABNORMAL LOW (ref 0.9–3.3)
nRBC: 0 % (ref 0–0)

## 2013-12-18 LAB — TECHNOLOGIST REVIEW

## 2013-12-18 LAB — HOLD TUBE, BLOOD BANK

## 2013-12-18 MED ORDER — ONDANSETRON HCL 8 MG PO TABS
ORAL_TABLET | ORAL | Status: AC
  Filled 2013-12-18: qty 1

## 2013-12-18 MED ORDER — ONDANSETRON HCL 8 MG PO TABS
8.0000 mg | ORAL_TABLET | Freq: Once | ORAL | Status: AC
  Administered 2013-12-18: 8 mg via ORAL

## 2013-12-18 MED ORDER — AZACITIDINE CHEMO SQ INJECTION
75.0000 mg/m2 | Freq: Once | INTRAMUSCULAR | Status: AC
  Administered 2013-12-18: 125 mg via SUBCUTANEOUS
  Filled 2013-12-18: qty 5

## 2013-12-18 NOTE — Progress Notes (Signed)
Dr. Alvy Bimler reviewed lab results today.  Proceed with chemo as per md.

## 2013-12-18 NOTE — Patient Instructions (Signed)
[facility] Discharge Instructions for Patients Receiving Chemotherapy  Today you received the following chemotherapy agents :  Vidaza.  To help prevent nausea and vomiting after your treatment, we encourage you to take your nausea medication as instructed by your physician.   If you develop nausea and vomiting that is not controlled by your nausea medication, call the clinic.   BELOW ARE SYMPTOMS THAT SHOULD BE REPORTED IMMEDIATELY:  *FEVER GREATER THAN 100.5 F  *CHILLS WITH OR WITHOUT FEVER  NAUSEA AND VOMITING THAT IS NOT CONTROLLED WITH YOUR NAUSEA MEDICATION  *UNUSUAL SHORTNESS OF BREATH  *UNUSUAL BRUISING OR BLEEDING  TENDERNESS IN MOUTH AND THROAT WITH OR WITHOUT PRESENCE OF ULCERS  *URINARY PROBLEMS  *BOWEL PROBLEMS  UNUSUAL RASH Items with * indicate a potential emergency and should be followed up as soon as possible.  Feel free to call the clinic you have any questions or concerns. The clinic phone number is [phone].    

## 2013-12-19 ENCOUNTER — Ambulatory Visit (HOSPITAL_BASED_OUTPATIENT_CLINIC_OR_DEPARTMENT_OTHER): Payer: Medicare Other

## 2013-12-19 VITALS — BP 157/68 | HR 88 | Temp 97.4°F | Resp 19

## 2013-12-19 DIAGNOSIS — D46C Myelodysplastic syndrome with isolated del(5q) chromosomal abnormality: Secondary | ICD-10-CM

## 2013-12-19 DIAGNOSIS — Z5111 Encounter for antineoplastic chemotherapy: Secondary | ICD-10-CM

## 2013-12-19 MED ORDER — AZACITIDINE CHEMO SQ INJECTION
75.0000 mg/m2 | Freq: Once | INTRAMUSCULAR | Status: AC
  Administered 2013-12-19: 125 mg via SUBCUTANEOUS
  Filled 2013-12-19: qty 5

## 2013-12-19 MED ORDER — ONDANSETRON HCL 8 MG PO TABS
8.0000 mg | ORAL_TABLET | Freq: Once | ORAL | Status: AC
  Administered 2013-12-19: 8 mg via ORAL

## 2013-12-19 MED ORDER — ONDANSETRON HCL 8 MG PO TABS
ORAL_TABLET | ORAL | Status: AC
  Filled 2013-12-19: qty 1

## 2013-12-19 NOTE — Patient Instructions (Signed)
[facility] Discharge Instructions for Patients Receiving Chemotherapy  Today you received the following chemotherapy agents: Vidaza  To help prevent nausea and vomiting after your treatment, we encourage you to take your nausea medication as prescribed by your physician.   If you develop nausea and vomiting that is not controlled by your nausea medication, call the clinic.   BELOW ARE SYMPTOMS THAT SHOULD BE REPORTED IMMEDIATELY:  *FEVER GREATER THAN 100.5 F  *CHILLS WITH OR WITHOUT FEVER  NAUSEA AND VOMITING THAT IS NOT CONTROLLED WITH YOUR NAUSEA MEDICATION  *UNUSUAL SHORTNESS OF BREATH  *UNUSUAL BRUISING OR BLEEDING  TENDERNESS IN MOUTH AND THROAT WITH OR WITHOUT PRESENCE OF ULCERS  *URINARY PROBLEMS  *BOWEL PROBLEMS  UNUSUAL RASH Items with * indicate a potential emergency and should be followed up as soon as possible.  Feel free to call the clinic you have any questions or concerns. The clinic phone number is [phone].    

## 2013-12-19 NOTE — Progress Notes (Signed)
Okay to tx per Dr. Alvy Bimler with 12/18/13 labs.

## 2013-12-25 ENCOUNTER — Other Ambulatory Visit: Payer: Self-pay | Admitting: *Deleted

## 2013-12-25 ENCOUNTER — Other Ambulatory Visit: Payer: Self-pay | Admitting: Hematology and Oncology

## 2013-12-25 ENCOUNTER — Non-Acute Institutional Stay (HOSPITAL_COMMUNITY)
Admission: AD | Admit: 2013-12-25 | Discharge: 2013-12-25 | Disposition: A | Discharge status: Home / Self Care | Payer: Medicare Other | Source: Ambulatory Visit | Attending: Hematology and Oncology | Admitting: Hematology and Oncology

## 2013-12-25 ENCOUNTER — Other Ambulatory Visit (HOSPITAL_BASED_OUTPATIENT_CLINIC_OR_DEPARTMENT_OTHER): Payer: Medicare Other

## 2013-12-25 DIAGNOSIS — D649 Anemia, unspecified: Secondary | ICD-10-CM

## 2013-12-25 DIAGNOSIS — D709 Neutropenia, unspecified: Secondary | ICD-10-CM

## 2013-12-25 DIAGNOSIS — D469 Myelodysplastic syndrome, unspecified: Secondary | ICD-10-CM

## 2013-12-25 DIAGNOSIS — D46C Myelodysplastic syndrome with isolated del(5q) chromosomal abnormality: Secondary | ICD-10-CM

## 2013-12-25 LAB — CBC WITH DIFFERENTIAL/PLATELET
BASO%: 1.9 % (ref 0.0–2.0)
BASOS ABS: 0.1 10*3/uL (ref 0.0–0.1)
EOS ABS: 0.4 10*3/uL (ref 0.0–0.5)
EOS%: 15.6 % — ABNORMAL HIGH (ref 0.0–7.0)
HEMATOCRIT: 20.4 % — AB (ref 34.8–46.6)
HEMOGLOBIN: 6.9 g/dL — AB (ref 11.6–15.9)
LYMPH%: 19.6 % (ref 14.0–49.7)
MCH: 27.5 pg (ref 25.1–34.0)
MCHC: 33.8 g/dL (ref 31.5–36.0)
MCV: 81.3 fL (ref 79.5–101.0)
MONO#: 0 10*3/uL — ABNORMAL LOW (ref 0.1–0.9)
MONO%: 0.7 % (ref 0.0–14.0)
NEUT%: 62.2 % (ref 38.4–76.8)
NEUTROS ABS: 1.7 10*3/uL (ref 1.5–6.5)
PLATELETS: 78 10*3/uL — AB (ref 145–400)
RBC: 2.51 10*6/uL — ABNORMAL LOW (ref 3.70–5.45)
RDW: 16.4 % — ABNORMAL HIGH (ref 11.2–14.5)
WBC: 2.7 10*3/uL — ABNORMAL LOW (ref 3.9–10.3)
lymph#: 0.5 10*3/uL — ABNORMAL LOW (ref 0.9–3.3)

## 2013-12-25 LAB — PREPARE RBC (CROSSMATCH)

## 2013-12-25 LAB — HOLD TUBE, BLOOD BANK

## 2013-12-25 MED ORDER — ACETAMINOPHEN 325 MG PO TABS
650.0000 mg | ORAL_TABLET | Freq: Once | ORAL | Status: AC
  Administered 2013-12-25: 650 mg via ORAL
  Filled 2013-12-25: qty 2

## 2013-12-25 MED ORDER — DIPHENHYDRAMINE HCL 25 MG PO CAPS
25.0000 mg | ORAL_CAPSULE | Freq: Once | ORAL | Status: AC
  Administered 2013-12-25: 25 mg via ORAL
  Filled 2013-12-25: qty 1

## 2013-12-25 MED ORDER — SODIUM CHLORIDE 0.9 % IV SOLN
250.0000 mL | Freq: Once | INTRAVENOUS | Status: AC
  Administered 2013-12-25: 12:00:00 via INTRAVENOUS

## 2013-12-25 MED ORDER — HEPARIN SOD (PORK) LOCK FLUSH 100 UNIT/ML IV SOLN: 250.0000 [IU] | INTRAVENOUS | Status: DC | PRN

## 2013-12-25 MED ORDER — HEPARIN SOD (PORK) LOCK FLUSH 100 UNIT/ML IV SOLN: 500.0000 [IU] | Freq: Every day | INTRAVENOUS | Status: DC | PRN

## 2013-12-25 MED ORDER — SODIUM CHLORIDE 0.9 % IJ SOLN: 3.0000 mL | INTRAMUSCULAR | Status: DC | PRN

## 2013-12-25 MED ORDER — FUROSEMIDE 10 MG/ML IJ SOLN
20.0000 mg | Freq: Once | INTRAMUSCULAR | Status: AC
  Administered 2013-12-25: 20 mg via INTRAVENOUS
  Filled 2013-12-25: qty 2

## 2013-12-25 MED ORDER — SODIUM CHLORIDE 0.9 % IJ SOLN: 10.0000 mL | INTRAMUSCULAR | Status: DC | PRN

## 2013-12-25 NOTE — Discharge Instructions (Signed)
Blood Transfusion Information WHAT IS A BLOOD TRANSFUSION? A transfusion is the replacement of blood or some of its parts. Blood is made up of multiple cells which provide different functions.  Red blood cells carry oxygen and are used for blood loss replacement.  White blood cells fight against infection.  Platelets control bleeding.  Plasma helps clot blood.  Other blood products are available for specialized needs, such as hemophilia or other clotting disorders. BEFORE THE TRANSFUSION  Who gives blood for transfusions?   You may be able to donate blood to be used at a later date on yourself (autologous donation).  Relatives can be asked to donate blood. This is generally not any safer than if you have received blood from a stranger. The same precautions are taken to ensure safety when a relative's blood is donated.  Healthy volunteers who are fully evaluated to make sure their blood is safe. This is blood bank blood. Transfusion therapy is the safest it has ever been in the practice of medicine. Before blood is taken from a donor, a complete history is taken to make sure that person has no history of diseases nor engages in risky social behavior (examples are intravenous drug use or sexual activity with multiple partners). The donor's travel history is screened to minimize risk of transmitting infections, such as malaria. The donated blood is tested for signs of infectious diseases, such as HIV and hepatitis. The blood is then tested to be sure it is compatible with you in order to minimize the chance of a transfusion reaction. If you or a relative donates blood, this is often done in anticipation of surgery and is not appropriate for emergency situations. It takes many days to process the donated blood. RISKS AND COMPLICATIONS Although transfusion therapy is very safe and saves many lives, the main dangers of transfusion include:   Getting an infectious disease.  Developing a  transfusion reaction. This is an allergic reaction to something in the blood you were given. Every precaution is taken to prevent this. The decision to have a blood transfusion has been considered carefully by your caregiver before blood is given. Blood is not given unless the benefits outweigh the risks. AFTER THE TRANSFUSION  Right after receiving a blood transfusion, you will usually feel much better and more energetic. This is especially true if your red blood cells have gotten low (anemic). The transfusion raises the level of the red blood cells which carry oxygen, and this usually causes an energy increase.  The nurse administering the transfusion will monitor you carefully for complications. HOME CARE INSTRUCTIONS  No special instructions are needed after a transfusion. You may find your energy is better. Speak with your caregiver about any limitations on activity for underlying diseases you may have. SEEK MEDICAL CARE IF:   Your condition is not improving after your transfusion.  You develop redness or irritation at the intravenous (IV) site. SEEK IMMEDIATE MEDICAL CARE IF:  Any of the following symptoms occur over the next 12 hours:  Shaking chills.  You have a temperature by mouth above 102 F (38.9 C), not controlled by medicine.  Chest, back, or muscle pain.  People around you feel you are not acting correctly or are confused.  Shortness of breath or difficulty breathing.  Dizziness and fainting.  You get a rash or develop hives.  You have a decrease in urine output.  Your urine turns a dark color or changes to pink, red, or brown. Any of the following   symptoms occur over the next 10 days:  You have a temperature by mouth above 102 F (38.9 C), not controlled by medicine.  Shortness of breath.  Weakness after normal activity.  The white part of the eye turns yellow (jaundice).  You have a decrease in the amount of urine or are urinating less often.  Your  urine turns a dark color or changes to pink, red, or brown. Document Released: 09/18/2000 Document Revised: 12/14/2011 Document Reviewed: 05/07/2008 Exit[employer] Patient Information 2014 [employer].  

## 2013-12-25 NOTE — Procedures (Signed)
[facility]  Procedure Note  SUMIKO CEASAR YIR:[id number] DOB: [birth year] DOA: 12/25/2013   PCP: Dr. Alvy Bimler  Associated Diagnosis: MDS, anemia  Procedure Note: patient recvd 2units PRBC's   Condition During Procedure: tolerated transfusions without any complications   Condition at [place] at discharge, daughter at bedside.   Roberto Scales, RN  [facility]

## 2013-12-25 NOTE — H&P (Signed)
The patient is here to receive blood transfusion only

## 2013-12-26 LAB — TYPE AND SCREEN
ABO/RH(D): A POS
Antibody Screen: NEGATIVE
UNIT DIVISION: 0
Unit division: 0

## 2013-12-28 ENCOUNTER — Telehealth: Payer: Self-pay | Admitting: *Deleted

## 2013-12-28 NOTE — Telephone Encounter (Signed)
OK 

## 2013-12-28 NOTE — Telephone Encounter (Signed)
Dau asks if ok for pt to get a hair perm while on Vidaza?

## 2013-12-28 NOTE — Telephone Encounter (Signed)
Informed dau it is ok per Dr. Alvy Bimler for pt to get a perm.  She verbalized understanding.

## 2014-01-01 ENCOUNTER — Ambulatory Visit (HOSPITAL_BASED_OUTPATIENT_CLINIC_OR_DEPARTMENT_OTHER): Payer: Medicare Other

## 2014-01-01 ENCOUNTER — Other Ambulatory Visit: Payer: Self-pay | Admitting: *Deleted

## 2014-01-01 ENCOUNTER — Other Ambulatory Visit: Payer: Self-pay | Admitting: Hematology and Oncology

## 2014-01-01 ENCOUNTER — Other Ambulatory Visit (HOSPITAL_BASED_OUTPATIENT_CLINIC_OR_DEPARTMENT_OTHER): Payer: Medicare Other

## 2014-01-01 VITALS — BP 134/53 | HR 66 | Temp 98.2°F | Resp 18

## 2014-01-01 DIAGNOSIS — D469 Myelodysplastic syndrome, unspecified: Secondary | ICD-10-CM

## 2014-01-01 DIAGNOSIS — D630 Anemia in neoplastic disease: Secondary | ICD-10-CM

## 2014-01-01 DIAGNOSIS — D649 Anemia, unspecified: Secondary | ICD-10-CM

## 2014-01-01 DIAGNOSIS — D46C Myelodysplastic syndrome with isolated del(5q) chromosomal abnormality: Secondary | ICD-10-CM

## 2014-01-01 LAB — CBC WITH DIFFERENTIAL/PLATELET
BASO%: 7.1 % — AB (ref 0.0–2.0)
Basophils Absolute: 0.1 10*3/uL (ref 0.0–0.1)
EOS%: 39.3 % — AB (ref 0.0–7.0)
Eosinophils Absolute: 0.4 10*3/uL (ref 0.0–0.5)
HCT: 23.1 % — ABNORMAL LOW (ref 34.8–46.6)
HGB: 7.7 g/dL — ABNORMAL LOW (ref 11.6–15.9)
LYMPH#: 0.4 10*3/uL — AB (ref 0.9–3.3)
LYMPH%: 32.1 % (ref 14.0–49.7)
MCH: 26.9 pg (ref 25.1–34.0)
MCHC: 33.3 g/dL (ref 31.5–36.0)
MCV: 80.8 fL (ref 79.5–101.0)
MONO#: 0 10*3/uL — AB (ref 0.1–0.9)
MONO%: 0.9 % (ref 0.0–14.0)
NEUT#: 0.2 10*3/uL — CL (ref 1.5–6.5)
NEUT%: 20.6 % — AB (ref 38.4–76.8)
Platelets: 95 10*3/uL — ABNORMAL LOW (ref 145–400)
RBC: 2.86 10*6/uL — AB (ref 3.70–5.45)
RDW: 15.4 % — ABNORMAL HIGH (ref 11.2–14.5)
WBC: 1.1 10*3/uL — AB (ref 3.9–10.3)
nRBC: 0 % (ref 0–0)

## 2014-01-01 LAB — PREPARE RBC (CROSSMATCH)

## 2014-01-01 LAB — HOLD TUBE, BLOOD BANK

## 2014-01-01 MED ORDER — DIPHENHYDRAMINE HCL 25 MG PO CAPS
ORAL_CAPSULE | ORAL | Status: AC
  Filled 2014-01-01: qty 1

## 2014-01-01 MED ORDER — DIPHENHYDRAMINE HCL 25 MG PO CAPS
25.0000 mg | ORAL_CAPSULE | Freq: Once | ORAL | Status: AC
  Administered 2014-01-01: 25 mg via ORAL

## 2014-01-01 MED ORDER — SODIUM CHLORIDE 0.9 % IV SOLN
250.0000 mL | Freq: Once | INTRAVENOUS | Status: AC
Start: 2014-01-01 — End: 2014-01-01
  Administered 2014-01-01: 250 mL via INTRAVENOUS

## 2014-01-01 MED ORDER — FUROSEMIDE 10 MG/ML IJ SOLN
20.0000 mg | Freq: Once | INTRAMUSCULAR | Status: AC
  Administered 2014-01-01: 20 mg via INTRAVENOUS

## 2014-01-01 MED ORDER — ACETAMINOPHEN 325 MG PO TABS
650.0000 mg | ORAL_TABLET | Freq: Once | ORAL | Status: AC
  Administered 2014-01-01: 650 mg via ORAL

## 2014-01-01 MED ORDER — ACETAMINOPHEN 325 MG PO TABS
ORAL_TABLET | ORAL | Status: AC
  Filled 2014-01-01: qty 2

## 2014-01-01 NOTE — Patient Instructions (Signed)
Blood Transfusion  A blood transfusion replaces your blood or some of its parts. Blood is replaced when you have lost blood because of surgery, an accident, or for severe blood conditions like anemia. You can donate blood to be used on yourself if you have a planned surgery. If you lose blood during that surgery, your own blood can be given back to you. Any blood given to you is checked to make sure it matches your blood type. Your temperature, blood pressure, and heart rate (vital signs) will be checked often.  GET HELP RIGHT AWAY IF:   You feel sick to your stomach (nauseous) or throw up (vomit).  You have watery poop (diarrhea).  You have shortness of breath or trouble breathing.  You have blood in your pee (urine) or have dark colored pee.  You have chest pain or tightness.  Your eyes or skin turn yellow (jaundice).  You have a temperature by mouth above 102 F (38.9 C), not controlled by medicine.  You start to shake and have chills.  You develop a a red rash (hives) or feel itchy.  You develop lightheadedness or feel confused.  You develop back, joint, or muscle pain.  You do not feel hungry (lost appetite).  You feel tired, restless, or nervous.  You develop belly (abdominal) cramps. Document Released: 12/18/2008 Document Revised: 12/14/2011 Document Reviewed: 12/18/2008 ExitCare Patient Information 2014 [employer].  

## 2014-01-02 LAB — TYPE AND SCREEN
ABO/RH(D): A POS
Antibody Screen: NEGATIVE
Unit division: 0
Unit division: 0

## 2014-01-08 ENCOUNTER — Other Ambulatory Visit (HOSPITAL_BASED_OUTPATIENT_CLINIC_OR_DEPARTMENT_OTHER): Payer: Medicare Other

## 2014-01-08 DIAGNOSIS — D649 Anemia, unspecified: Secondary | ICD-10-CM

## 2014-01-08 DIAGNOSIS — D46C Myelodysplastic syndrome with isolated del(5q) chromosomal abnormality: Secondary | ICD-10-CM

## 2014-01-08 LAB — CBC WITH DIFFERENTIAL/PLATELET
BASO%: 4.8 % — AB (ref 0.0–2.0)
Basophils Absolute: 0.1 10*3/uL (ref 0.0–0.1)
EOS ABS: 0.7 10*3/uL — AB (ref 0.0–0.5)
EOS%: 48.6 % — ABNORMAL HIGH (ref 0.0–7.0)
HCT: 25.6 % — ABNORMAL LOW (ref 34.8–46.6)
HGB: 8.7 g/dL — ABNORMAL LOW (ref 11.6–15.9)
LYMPH#: 0.4 10*3/uL — AB (ref 0.9–3.3)
LYMPH%: 28.8 % (ref 14.0–49.7)
MCH: 28.2 pg (ref 25.1–34.0)
MCHC: 34 g/dL (ref 31.5–36.0)
MCV: 83.1 fL (ref 79.5–101.0)
MONO#: 0 10*3/uL — AB (ref 0.1–0.9)
MONO%: 1.4 % (ref 0.0–14.0)
NEUT%: 16.4 % — ABNORMAL LOW (ref 38.4–76.8)
NEUTROS ABS: 0.2 10*3/uL — AB (ref 1.5–6.5)
NRBC: 0 % (ref 0–0)
Platelets: 202 10*3/uL (ref 145–400)
RBC: 3.08 10*6/uL — ABNORMAL LOW (ref 3.70–5.45)
RDW: 15.2 % — AB (ref 11.2–14.5)
WBC: 1.5 10*3/uL — AB (ref 3.9–10.3)

## 2014-01-08 LAB — HOLD TUBE, BLOOD BANK

## 2014-01-15 ENCOUNTER — Telehealth: Payer: Self-pay | Admitting: *Deleted

## 2014-01-15 ENCOUNTER — Ambulatory Visit (HOSPITAL_BASED_OUTPATIENT_CLINIC_OR_DEPARTMENT_OTHER): Payer: Medicare Other

## 2014-01-15 ENCOUNTER — Ambulatory Visit (HOSPITAL_BASED_OUTPATIENT_CLINIC_OR_DEPARTMENT_OTHER): Payer: Medicare Other | Admitting: Hematology and Oncology

## 2014-01-15 ENCOUNTER — Other Ambulatory Visit: Payer: Self-pay | Admitting: Hematology and Oncology

## 2014-01-15 ENCOUNTER — Encounter: Payer: Self-pay | Admitting: Hematology and Oncology

## 2014-01-15 ENCOUNTER — Telehealth: Payer: Self-pay | Admitting: Hematology and Oncology

## 2014-01-15 ENCOUNTER — Ambulatory Visit (HOSPITAL_COMMUNITY)
Admission: RE | Admit: 2014-01-15 | Discharge: 2014-01-15 | Disposition: A | Discharge status: Home / Self Care | Payer: Medicare Other | Source: Ambulatory Visit | Attending: Hematology and Oncology | Admitting: Hematology and Oncology

## 2014-01-15 ENCOUNTER — Other Ambulatory Visit (HOSPITAL_BASED_OUTPATIENT_CLINIC_OR_DEPARTMENT_OTHER): Payer: Medicare Other

## 2014-01-15 VITALS — BP 147/46 | HR 67 | Temp 97.4°F | Resp 18

## 2014-01-15 VITALS — BP 132/33 | HR 73 | Temp 97.5°F | Resp 18 | Ht 62.0 in | Wt 140.5 lb

## 2014-01-15 DIAGNOSIS — D46C Myelodysplastic syndrome with isolated del(5q) chromosomal abnormality: Secondary | ICD-10-CM

## 2014-01-15 DIAGNOSIS — D649 Anemia, unspecified: Secondary | ICD-10-CM

## 2014-01-15 DIAGNOSIS — L408 Other psoriasis: Secondary | ICD-10-CM

## 2014-01-15 DIAGNOSIS — D702 Other drug-induced agranulocytosis: Secondary | ICD-10-CM

## 2014-01-15 DIAGNOSIS — D469 Myelodysplastic syndrome, unspecified: Secondary | ICD-10-CM

## 2014-01-15 DIAGNOSIS — Z5111 Encounter for antineoplastic chemotherapy: Secondary | ICD-10-CM

## 2014-01-15 DIAGNOSIS — D630 Anemia in neoplastic disease: Secondary | ICD-10-CM | POA: Insufficient documentation

## 2014-01-15 LAB — COMPREHENSIVE METABOLIC PANEL (CC13)
ALK PHOS: 105 U/L (ref 40–150)
ALT: 13 U/L (ref 0–55)
ANION GAP: 9 meq/L (ref 3–11)
AST: 12 U/L (ref 5–34)
Albumin: 3.7 g/dL (ref 3.5–5.0)
BUN: 25.3 mg/dL (ref 7.0–26.0)
CO2: 26 meq/L (ref 22–29)
Calcium: 9.4 mg/dL (ref 8.4–10.4)
Chloride: 110 mEq/L — ABNORMAL HIGH (ref 98–109)
Creatinine: 0.8 mg/dL (ref 0.6–1.1)
Glucose: 147 mg/dl — ABNORMAL HIGH (ref 70–140)
POTASSIUM: 3.6 meq/L (ref 3.5–5.1)
SODIUM: 145 meq/L (ref 136–145)
TOTAL PROTEIN: 6.4 g/dL (ref 6.4–8.3)
Total Bilirubin: 0.72 mg/dL (ref 0.20–1.20)

## 2014-01-15 LAB — CBC WITH DIFFERENTIAL/PLATELET
BASO%: 2.1 % — ABNORMAL HIGH (ref 0.0–2.0)
Basophils Absolute: 0 10*3/uL (ref 0.0–0.1)
EOS ABS: 0.3 10*3/uL (ref 0.0–0.5)
EOS%: 17.9 % — ABNORMAL HIGH (ref 0.0–7.0)
HCT: 22.8 % — ABNORMAL LOW (ref 34.8–46.6)
HGB: 7.7 g/dL — ABNORMAL LOW (ref 11.6–15.9)
LYMPH#: 0.4 10*3/uL — AB (ref 0.9–3.3)
LYMPH%: 26.4 % (ref 14.0–49.7)
MCH: 28.1 pg (ref 25.1–34.0)
MCHC: 33.8 g/dL (ref 31.5–36.0)
MCV: 83.2 fL (ref 79.5–101.0)
MONO#: 0 10*3/uL — ABNORMAL LOW (ref 0.1–0.9)
MONO%: 2.1 % (ref 0.0–14.0)
NEUT%: 51.5 % (ref 38.4–76.8)
NEUTROS ABS: 0.7 10*3/uL — AB (ref 1.5–6.5)
Platelets: 219 10*3/uL (ref 145–400)
RBC: 2.74 10*6/uL — ABNORMAL LOW (ref 3.70–5.45)
RDW: 15 % — AB (ref 11.2–14.5)
WBC: 1.4 10*3/uL — ABNORMAL LOW (ref 3.9–10.3)

## 2014-01-15 LAB — PREPARE RBC (CROSSMATCH)

## 2014-01-15 LAB — HOLD TUBE, BLOOD BANK

## 2014-01-15 MED ORDER — ONDANSETRON HCL 8 MG PO TABS
8.0000 mg | ORAL_TABLET | Freq: Once | ORAL | Status: AC
  Administered 2014-01-15: 8 mg via ORAL

## 2014-01-15 MED ORDER — FUROSEMIDE 10 MG/ML IJ SOLN
20.0000 mg | Freq: Once | INTRAMUSCULAR | Status: AC
  Administered 2014-01-15: 20 mg via INTRAVENOUS

## 2014-01-15 MED ORDER — ACETAMINOPHEN 325 MG PO TABS
ORAL_TABLET | ORAL | Status: AC
  Filled 2014-01-15: qty 2

## 2014-01-15 MED ORDER — DIPHENHYDRAMINE HCL 25 MG PO CAPS
ORAL_CAPSULE | ORAL | Status: AC
  Filled 2014-01-15: qty 1

## 2014-01-15 MED ORDER — LIDOCAINE-PRILOCAINE 2.5-2.5 % EX CREA: 1.0000 "application " | TOPICAL_CREAM | CUTANEOUS | Status: DC | PRN

## 2014-01-15 MED ORDER — ACETAMINOPHEN 325 MG PO TABS
650.0000 mg | ORAL_TABLET | Freq: Once | ORAL | Status: AC
  Administered 2014-01-15: 650 mg via ORAL

## 2014-01-15 MED ORDER — AZACITIDINE CHEMO SQ INJECTION
75.0000 mg/m2 | Freq: Once | INTRAMUSCULAR | Status: AC
  Administered 2014-01-15: 125 mg via SUBCUTANEOUS
  Filled 2014-01-15: qty 5

## 2014-01-15 MED ORDER — ONDANSETRON HCL 8 MG PO TABS
ORAL_TABLET | ORAL | Status: AC
  Filled 2014-01-15: qty 1

## 2014-01-15 MED ORDER — SODIUM CHLORIDE 0.9 % IV SOLN
250.0000 mL | Freq: Once | INTRAVENOUS | Status: AC
  Administered 2014-01-15: 250 mL via INTRAVENOUS

## 2014-01-15 MED ORDER — DIPHENHYDRAMINE HCL 25 MG PO CAPS
25.0000 mg | ORAL_CAPSULE | Freq: Once | ORAL | Status: AC
  Administered 2014-01-15: 25 mg via ORAL

## 2014-01-15 NOTE — Telephone Encounter (Signed)
Per staff message and POF I have scheduled appts.  JMW  

## 2014-01-15 NOTE — Telephone Encounter (Signed)
BMBx scheduled in Short Stay for 5/11 at 8 am.  Notified Carter in Flow cytometry.  Gave pt written instructions for Bx w/ date/ time.  Arrive at 7 am to Short Stay at [facility],  [place] after midnight and need driver home.  She verbalized understanding.

## 2014-01-15 NOTE — Patient Instructions (Signed)
[facility] Discharge Instructions for Patients Receiving Chemotherapy  Today you received the following chemotherapy agents: Vidaza  To help prevent nausea and vomiting after your treatment, we encourage you to take your nausea medication as prescribed.    If you develop nausea and vomiting that is not controlled by your nausea medication, call the clinic.   BELOW ARE SYMPTOMS THAT SHOULD BE REPORTED IMMEDIATELY:  *FEVER GREATER THAN 100.5 F  *CHILLS WITH OR WITHOUT FEVER  NAUSEA AND VOMITING THAT IS NOT CONTROLLED WITH YOUR NAUSEA MEDICATION  *UNUSUAL SHORTNESS OF BREATH  *UNUSUAL BRUISING OR BLEEDING  TENDERNESS IN MOUTH AND THROAT WITH OR WITHOUT PRESENCE OF ULCERS  *URINARY PROBLEMS  *BOWEL PROBLEMS  UNUSUAL RASH Items with * indicate a potential emergency and should be followed up as soon as possible.  Feel free to call the clinic you have any questions or concerns. The clinic phone number is [phone].  Blood Transfusion  A blood transfusion replaces your blood or some of its parts. Blood is replaced when you have lost blood because of surgery, an accident, or for severe blood conditions like anemia. You can donate blood to be used on yourself if you have a planned surgery. If you lose blood during that surgery, your own blood can be given back to you. Any blood given to you is checked to make sure it matches your blood type. Your temperature, blood pressure, and heart rate (vital signs) will be checked often.  GET HELP RIGHT AWAY IF:   You feel sick to your stomach (nauseous) or throw up (vomit).  You have watery poop (diarrhea).  You have shortness of breath or trouble breathing.  You have blood in your pee (urine) or have dark colored pee.  You have chest pain or tightness.  Your eyes or skin turn yellow (jaundice).  You have a temperature by mouth above 102 F (38.9 C), not controlled by medicine.  You start to shake and have  chills.  You develop a a red rash (hives) or feel itchy.  You develop lightheadedness or feel confused.  You develop back, joint, or muscle pain.  You do not feel hungry (lost appetite).  You feel tired, restless, or nervous.  You develop belly (abdominal) cramps. Document Released: 12/18/2008 Document Revised: 12/14/2011 Document Reviewed: 12/18/2008 [facility] Patient Information 2014 [place], [place].

## 2014-01-15 NOTE — Telephone Encounter (Signed)
gave pt appt for lab,md and chemo for APril, email [employer] regarding chemo on may 2015

## 2014-01-15 NOTE — Progress Notes (Signed)
Treat with Vidaza despite counts, OK to treat without CMET per [place], RN/ Dr. Alvy Bimler.

## 2014-01-15 NOTE — Progress Notes (Signed)
[facility] OFFICE PROGRESS NOTE  Patient Care Team: Mayra Neer, MD as PCP - General (Family Medicine) Heath Lark, MD as Consulting Physician (Hematology and Oncology)  DIAGNOSIS: Myelodysplastic syndrome, ongoing treatment  SUMMARY OF ONCOLOGIC HISTORY: This is a very pleasant 80 year old lady who become transfusion dependent over the last 3 months. She is transfusion dependent. She denies any bleeding such as epistaxis, hematuria, or hematochezia. 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 Erythropoietin stimulating agents were stopped when her erythropoietin level came back over 500 In October 2014, she was started on Revlimid however develop severe allergic reaction to Revlimid. That was discontinued. On 08/07/2013, we started her on Vidaza for 2 cycles. On 10/04/2013, repeat a bone marrow aspirate and biopsy showed persistent disease. Additional testing revealed that her disease is JAK 2 positive On 11/06/2013, Vidaza is resumed after her second opinion from [facility]: Rebekah Cross 80 y.o. female returns for cycle 3 of treatment. She has recent fatigue. Her psoriasis is getting worse since we started initiating taper of prednisone. She denies any recent infection.  I have reviewed the past medical history, past surgical history, social history and family history with the patient and they are unchanged from previous note.  ALLERGIES:  is allergic to novocain; oxycodone; codeine; methimazole; and revlimid.  MEDICATIONS:  Current Outpatient Prescriptions  Medication Sig Dispense Refill  . acetaminophen (TYLENOL) 500 MG tablet Take 1,000 mg by mouth every 6 (six) hours as needed for fever.      Marland Kitchen aspirin 81 MG tablet Take 81 mg by mouth every morning.       . cholecalciferol (VITAMIN D) 400 UNITS TABS Take 400 Units by mouth every morning.       .  clobetasol cream (TEMOVATE) 0.05 % Apply 6.14 application topically 2 (two) times daily.      . hydrOXYzine (ATARAX/VISTARIL) 25 MG tablet TAKE 1 TABLET BY MOUTH EVERY 4 HOURS AS NEEDED FOR ITCHING  60 tablet  3  . LORazepam (ATIVAN) 0.5 MG tablet Take 0.5 mg by mouth at bedtime.       . metoprolol succinate (TOPROL-XL) 100 MG 24 hr tablet Take 50 mg by mouth daily.       . Multiple Vitamin (MULTIVITAMIN) tablet Take 1 tablet by mouth every morning.       . nisoldipine (SULAR) 25.5 MG 24 hr tablet Take 25.5 mg by mouth every morning.       . olmesartan-hydrochlorothiazide (BENICAR HCT) 40-25 MG per tablet Take 1 tablet by mouth every morning.       . ondansetron (ZOFRAN) 8 MG tablet Take 8 mg by mouth every 8 (eight) hours as needed for nausea or vomiting.       . potassium chloride SA (K-DUR,KLOR-CON) 20 MEQ tablet Take 20 mEq by mouth every morning.       . pravastatin (PRAVACHOL) 40 MG tablet Take 40 mg by mouth at bedtime.       . predniSONE (DELTASONE) 10 MG tablet Take 5 mg by mouth daily with breakfast.      . prochlorperazine (COMPAZINE) 10 MG tablet TAKE 1 TABLET BY MOUTH EVERY 6 HOURS AS NEEDED FOR NAUSEA OR VOMITING  30 tablet  2  . ciprofloxacin (CIPRO) 500 MG tablet Take 1 tablet (500 mg total) by mouth 2 (two) times daily.  14 tablet  0  . lidocaine-prilocaine (EMLA)  cream Apply 1 application topically as needed.  30 g  0   No current facility-administered medications for this visit.   Facility-Administered Medications Ordered in Other Visits  Medication Dose Route Frequency Provider Last Rate Last Dose  . sodium chloride 0.9 % injection 3 mL  3 mL Intracatheter PRN Heath Lark, MD        REVIEW OF SYSTEMS:   Constitutional: Denies fevers, chills or abnormal weight loss Eyes: Denies blurriness of vision Ears, nose, mouth, throat, and face: Denies mucositis or sore throat Respiratory: Denies cough, dyspnea or wheezes Cardiovascular: Denies palpitation, chest discomfort or lower  extremity swelling Gastrointestinal:  Denies nausea, heartburn or change in bowel habits Lymphatics: Denies new lymphadenopathy or easy bruising Neurological:Denies numbness, tingling or new weaknesses Behavioral/Psych: Mood is stable, no new changes  All other systems were reviewed with the patient and are negative.  PHYSICAL EXAMINATION: ECOG PERFORMANCE STATUS: 1 - Symptomatic but completely ambulatory  Filed Vitals:   01/15/14 0855  BP: 132/33  Pulse: 73  Temp: 97.5 F (36.4 C)  Resp: 18   Filed Weights   01/15/14 0855  Weight: 140 lb 8 oz (63.73 kg)    GENERAL:alert, no distress and comfortable SKIN: Significant breakout of psoriasis is noted throughout the body. EYES: normal, Conjunctiva are pale and non-injected, sclera clear OROPHARYNX:no exudate, no erythema and lips, buccal mucosa, and tongue normal  NECK: supple, thyroid normal size, non-tender, without nodularity LYMPH:  no palpable lymphadenopathy in the cervical, axillary or inguinal LUNGS: clear to auscultation and percussion with normal breathing effort HEART: regular rate & rhythm and no murmurs and no lower extremity edema ABDOMEN:abdomen soft, non-tender and normal bowel sounds Musculoskeletal:no cyanosis of digits and no clubbing  NEURO: alert & oriented x 3 with fluent speech, no focal motor/sensory deficits  LABORATORY DATA:  I have reviewed the data as listed    Component Value Date/Time   NA 145 12/11/2013 0840   NA 136 08/27/2013 1212   K 3.2* 12/11/2013 0840   K 3.4* 08/27/2013 1212   CL 102 08/27/2013 1212   CO2 26 12/11/2013 0840   CO2 23 08/27/2013 1212   GLUCOSE 151* 12/11/2013 0840   GLUCOSE 118* 08/27/2013 1212   BUN 21.8 12/11/2013 0840   BUN 23 08/27/2013 1212   CREATININE 0.8 12/11/2013 0840   CREATININE 0.78 08/27/2013 1212   CALCIUM 9.2 12/11/2013 0840   CALCIUM 9.2 08/27/2013 1212   PROT 6.6 12/11/2013 0840   PROT 6.6 08/27/2013 1212   ALBUMIN 3.7 12/11/2013 0840   ALBUMIN 3.8 08/27/2013  1212   AST 12 12/11/2013 0840   AST 17 08/27/2013 1212   ALT 19 12/11/2013 0840   ALT 25 08/27/2013 1212   ALKPHOS 70 12/11/2013 0840   ALKPHOS 56 08/27/2013 1212   BILITOT 0.62 12/11/2013 0840   BILITOT 1.9* 08/27/2013 1212   GFRNONAA 74* 08/27/2013 1212   GFRAA 85* 08/27/2013 1212    No results found for this basename: SPEP,  UPEP,   kappa and lambda light chains    Lab Results  Component Value Date   WBC 1.4* 01/15/2014   NEUTROABS 0.7* 01/15/2014   HGB 7.7* 01/15/2014   HCT 22.8* 01/15/2014   MCV 83.2 01/15/2014   PLT 219 01/15/2014      Chemistry      Component Value Date/Time   NA 145 12/11/2013 0840   NA 136 08/27/2013 1212   K 3.2* 12/11/2013 0840   K 3.4* 08/27/2013 1212   CL  102 08/27/2013 1212   CO2 26 12/11/2013 0840   CO2 23 08/27/2013 1212   BUN 21.8 12/11/2013 0840   BUN 23 08/27/2013 1212   CREATININE 0.8 12/11/2013 0840   CREATININE 0.78 08/27/2013 1212      Component Value Date/Time   CALCIUM 9.2 12/11/2013 0840   CALCIUM 9.2 08/27/2013 1212   ALKPHOS 70 12/11/2013 0840   ALKPHOS 56 08/27/2013 1212   AST 12 12/11/2013 0840   AST 17 08/27/2013 1212   ALT 19 12/11/2013 0840   ALT 25 08/27/2013 1212   BILITOT 0.62 12/11/2013 0840   BILITOT 1.9* 08/27/2013 1212     ASSESSMENT & PLAN:  #1 myelodysplastic syndrome I will proceed with treatment today I will order a bone marrow aspirate and biopsy to be performed next month to assess response to treatment #2 severe anemia We discussed some of the risks, benefits, and alternatives of blood transfusions. The patient is symptomatic from anemia and the hemoglobin level is critically low.  Some of the side-effects to be expected including risks of transfusion reactions, chills, infection, syndrome of volume overload and risk of hospitalization from various reasons and the patient is willing to proceed and went ahead to sign consent today. We will continue weekly blood work monitoring. #3 psoriasis This is worse since we started  initiating taper of prednisone. I recommend she contact a dermatologist for further treatment recommendation #4 severe neutropenia This is related to her treatment. Would continue treatment without dosage adjustment #5 poor venous access I will order port placements for her in the near future.  Orders Placed This Encounter  Procedures  . IR Fluoro Guide CV Line Right    Port placement for chemo    Standing Status: Future     Number of Occurrences:      Standing Expiration Date: 03/18/2015    Order Specific Question:  Reason for exam:    Answer:  need port for chemo    Order Specific Question:  Preferred Imaging Location?    Answer:  [facility]  . Comprehensive metabolic panel    Standing Status: Standing     Number of Occurrences: 4     Standing Expiration Date: 01/16/2015   All questions were answered. The patient knows to call the clinic with any problems, questions or concerns. No barriers to learning was detected.

## 2014-01-16 ENCOUNTER — Ambulatory Visit (HOSPITAL_BASED_OUTPATIENT_CLINIC_OR_DEPARTMENT_OTHER): Payer: Medicare Other

## 2014-01-16 VITALS — BP 139/51 | HR 82 | Temp 98.3°F

## 2014-01-16 DIAGNOSIS — D46C Myelodysplastic syndrome with isolated del(5q) chromosomal abnormality: Secondary | ICD-10-CM

## 2014-01-16 DIAGNOSIS — Z5111 Encounter for antineoplastic chemotherapy: Secondary | ICD-10-CM

## 2014-01-16 LAB — TYPE AND SCREEN
ABO/RH(D): A POS
ANTIBODY SCREEN: NEGATIVE
UNIT DIVISION: 0
Unit division: 0

## 2014-01-16 MED ORDER — ONDANSETRON HCL 8 MG PO TABS
8.0000 mg | ORAL_TABLET | Freq: Once | ORAL | Status: AC
  Administered 2014-01-16: 8 mg via ORAL

## 2014-01-16 MED ORDER — ONDANSETRON HCL 8 MG PO TABS
ORAL_TABLET | ORAL | Status: AC
  Filled 2014-01-16: qty 1

## 2014-01-16 MED ORDER — AZACITIDINE CHEMO SQ INJECTION
75.0000 mg/m2 | Freq: Once | INTRAMUSCULAR | Status: AC
  Administered 2014-01-16: 125 mg via SUBCUTANEOUS
  Filled 2014-01-16: qty 5

## 2014-01-16 NOTE — Patient Instructions (Signed)
[facility] Discharge Instructions for Patients Receiving Chemotherapy  Today you received the following chemotherapy agents vidaza   To help prevent nausea and vomiting after your treatment, we encourage you to take your nausea medication as directed. If you develop nausea and vomiting that is not controlled by your nausea medication, call the clinic.   BELOW ARE SYMPTOMS THAT SHOULD BE REPORTED IMMEDIATELY:  *FEVER GREATER THAN 100.5 F  *CHILLS WITH OR WITHOUT FEVER  NAUSEA AND VOMITING THAT IS NOT CONTROLLED WITH YOUR NAUSEA MEDICATION  *UNUSUAL SHORTNESS OF BREATH  *UNUSUAL BRUISING OR BLEEDING  TENDERNESS IN MOUTH AND THROAT WITH OR WITHOUT PRESENCE OF ULCERS  *URINARY PROBLEMS  *BOWEL PROBLEMS  UNUSUAL RASH Items with * indicate a potential emergency and should be followed up as soon as possible.  Feel free to call the clinic you have any questions or concerns. The clinic phone number is [phone].  

## 2014-01-17 ENCOUNTER — Ambulatory Visit (HOSPITAL_BASED_OUTPATIENT_CLINIC_OR_DEPARTMENT_OTHER): Payer: Medicare Other

## 2014-01-17 VITALS — BP 151/45 | HR 75 | Temp 97.5°F | Resp 20

## 2014-01-17 DIAGNOSIS — Z5111 Encounter for antineoplastic chemotherapy: Secondary | ICD-10-CM

## 2014-01-17 DIAGNOSIS — D46C Myelodysplastic syndrome with isolated del(5q) chromosomal abnormality: Secondary | ICD-10-CM

## 2014-01-17 MED ORDER — AZACITIDINE CHEMO SQ INJECTION
75.0000 mg/m2 | Freq: Once | INTRAMUSCULAR | Status: AC
  Administered 2014-01-17: 125 mg via SUBCUTANEOUS
  Filled 2014-01-17: qty 5

## 2014-01-17 MED ORDER — ONDANSETRON HCL 8 MG PO TABS
8.0000 mg | ORAL_TABLET | Freq: Once | ORAL | Status: AC
  Administered 2014-01-17: 8 mg via ORAL

## 2014-01-17 MED ORDER — ONDANSETRON HCL 8 MG PO TABS
ORAL_TABLET | ORAL | Status: AC
Start: 2014-01-17 — End: 2014-01-17
  Filled 2014-01-17: qty 1

## 2014-01-17 NOTE — Patient Instructions (Signed)
[facility] Discharge Instructions for Patients Receiving Chemotherapy  Today you received the following chemotherapy agents VIDAZA SQ  To help prevent nausea and vomiting after your treatment, we encourage you to take your nausea medication IF NEEDED   If you develop nausea and vomiting that is not controlled by your nausea medication, call the clinic.   BELOW ARE SYMPTOMS THAT SHOULD BE REPORTED IMMEDIATELY:  *FEVER GREATER THAN 100.5 F  *CHILLS WITH OR WITHOUT FEVER  NAUSEA AND VOMITING THAT IS NOT CONTROLLED WITH YOUR NAUSEA MEDICATION  *UNUSUAL SHORTNESS OF BREATH  *UNUSUAL BRUISING OR BLEEDING  TENDERNESS IN MOUTH AND THROAT WITH OR WITHOUT PRESENCE OF ULCERS  *URINARY PROBLEMS  *BOWEL PROBLEMS  UNUSUAL RASH Items with * indicate a potential emergency and should be followed up as soon as possible.  Feel free to call the clinic you have any questions or concerns. The clinic phone number is [phone].

## 2014-01-18 ENCOUNTER — Ambulatory Visit (HOSPITAL_BASED_OUTPATIENT_CLINIC_OR_DEPARTMENT_OTHER): Payer: Medicare Other

## 2014-01-18 VITALS — BP 147/46 | HR 75 | Temp 97.8°F | Resp 18

## 2014-01-18 DIAGNOSIS — Z5111 Encounter for antineoplastic chemotherapy: Secondary | ICD-10-CM

## 2014-01-18 DIAGNOSIS — D46C Myelodysplastic syndrome with isolated del(5q) chromosomal abnormality: Secondary | ICD-10-CM

## 2014-01-18 MED ORDER — AZACITIDINE CHEMO SQ INJECTION
75.0000 mg/m2 | Freq: Once | INTRAMUSCULAR | Status: AC
  Administered 2014-01-18: 125 mg via SUBCUTANEOUS
  Filled 2014-01-18: qty 5

## 2014-01-18 MED ORDER — ONDANSETRON HCL 8 MG PO TABS
8.0000 mg | ORAL_TABLET | Freq: Once | ORAL | Status: AC
  Administered 2014-01-18: 8 mg via ORAL

## 2014-01-18 MED ORDER — ONDANSETRON HCL 8 MG PO TABS
ORAL_TABLET | ORAL | Status: AC
  Filled 2014-01-18: qty 1

## 2014-01-18 NOTE — Patient Instructions (Signed)
[facility] Discharge Instructions for Patients Receiving Chemotherapy  Today you received the following chemotherapy agents: Vidaza  To help prevent nausea and vomiting after your treatment, we encourage you to take your nausea medication: Compazine 10 mg every 6 hrs as needed.    If you develop nausea and vomiting that is not controlled by your nausea medication, call the clinic.   BELOW ARE SYMPTOMS THAT SHOULD BE REPORTED IMMEDIATELY:  *FEVER GREATER THAN 100.5 F  *CHILLS WITH OR WITHOUT FEVER  NAUSEA AND VOMITING THAT IS NOT CONTROLLED WITH YOUR NAUSEA MEDICATION  *UNUSUAL SHORTNESS OF BREATH  *UNUSUAL BRUISING OR BLEEDING  TENDERNESS IN MOUTH AND THROAT WITH OR WITHOUT PRESENCE OF ULCERS  *URINARY PROBLEMS  *BOWEL PROBLEMS  UNUSUAL RASH Items with * indicate a potential emergency and should be followed up as soon as possible.  Feel free to call the clinic you have any questions or concerns. The clinic phone number is [phone].

## 2014-01-19 ENCOUNTER — Ambulatory Visit (HOSPITAL_BASED_OUTPATIENT_CLINIC_OR_DEPARTMENT_OTHER): Payer: Medicare Other

## 2014-01-19 ENCOUNTER — Telehealth: Payer: Self-pay | Admitting: Hematology and Oncology

## 2014-01-19 VITALS — BP 143/63 | HR 75 | Temp 97.5°F | Resp 18

## 2014-01-19 DIAGNOSIS — D46C Myelodysplastic syndrome with isolated del(5q) chromosomal abnormality: Secondary | ICD-10-CM

## 2014-01-19 MED ORDER — ONDANSETRON HCL 8 MG PO TABS
8.0000 mg | ORAL_TABLET | Freq: Once | ORAL | Status: AC
  Administered 2014-01-19: 8 mg via ORAL

## 2014-01-19 MED ORDER — AZACITIDINE CHEMO SQ INJECTION
75.0000 mg/m2 | Freq: Once | INTRAMUSCULAR | Status: AC
  Administered 2014-01-19: 125 mg via SUBCUTANEOUS
  Filled 2014-01-19: qty 5

## 2014-01-19 MED ORDER — ONDANSETRON HCL 8 MG PO TABS
ORAL_TABLET | ORAL | Status: AC
  Filled 2014-01-19: qty 1

## 2014-01-19 NOTE — Telephone Encounter (Signed)
Called pt no answer , mailed letter to pt

## 2014-01-22 ENCOUNTER — Other Ambulatory Visit: Payer: Self-pay | Admitting: Hematology and Oncology

## 2014-01-22 ENCOUNTER — Encounter (HOSPITAL_COMMUNITY): Payer: Self-pay | Admitting: Pharmacy Technician

## 2014-01-22 ENCOUNTER — Ambulatory Visit (HOSPITAL_BASED_OUTPATIENT_CLINIC_OR_DEPARTMENT_OTHER): Payer: Medicare Other

## 2014-01-22 ENCOUNTER — Ambulatory Visit: Payer: Medicare Other

## 2014-01-22 ENCOUNTER — Other Ambulatory Visit: Payer: Self-pay | Admitting: Radiology

## 2014-01-22 VITALS — BP 131/60 | HR 79 | Temp 98.3°F | Resp 18

## 2014-01-22 DIAGNOSIS — D46C Myelodysplastic syndrome with isolated del(5q) chromosomal abnormality: Secondary | ICD-10-CM

## 2014-01-22 DIAGNOSIS — Z5111 Encounter for antineoplastic chemotherapy: Secondary | ICD-10-CM

## 2014-01-22 LAB — COMPREHENSIVE METABOLIC PANEL (CC13)
ALT: 12 U/L (ref 0–55)
ANION GAP: 8 meq/L (ref 3–11)
AST: 12 U/L (ref 5–34)
Albumin: 3.7 g/dL (ref 3.5–5.0)
Alkaline Phosphatase: 125 U/L (ref 40–150)
BILIRUBIN TOTAL: 0.92 mg/dL (ref 0.20–1.20)
BUN: 25.4 mg/dL (ref 7.0–26.0)
CHLORIDE: 108 meq/L (ref 98–109)
CO2: 28 meq/L (ref 22–29)
CREATININE: 0.9 mg/dL (ref 0.6–1.1)
Calcium: 10 mg/dL (ref 8.4–10.4)
Glucose: 185 mg/dl — ABNORMAL HIGH (ref 70–140)
Potassium: 3.5 mEq/L (ref 3.5–5.1)
SODIUM: 143 meq/L (ref 136–145)
TOTAL PROTEIN: 6.8 g/dL (ref 6.4–8.3)

## 2014-01-22 LAB — CBC WITH DIFFERENTIAL/PLATELET
BASO%: 2.7 % — ABNORMAL HIGH (ref 0.0–2.0)
Basophils Absolute: 0.1 10*3/uL (ref 0.0–0.1)
EOS%: 9.7 % — ABNORMAL HIGH (ref 0.0–7.0)
Eosinophils Absolute: 0.2 10*3/uL (ref 0.0–0.5)
HCT: 27.3 % — ABNORMAL LOW (ref 34.8–46.6)
HGB: 9.6 g/dL — ABNORMAL LOW (ref 11.6–15.9)
LYMPH#: 0.4 10*3/uL — AB (ref 0.9–3.3)
LYMPH%: 23.7 % (ref 14.0–49.7)
MCH: 29.2 pg (ref 25.1–34.0)
MCHC: 35.2 g/dL (ref 31.5–36.0)
MCV: 83 fL (ref 79.5–101.0)
MONO#: 0 10*3/uL — AB (ref 0.1–0.9)
MONO%: 1.1 % (ref 0.0–14.0)
NEUT#: 1.2 10*3/uL — ABNORMAL LOW (ref 1.5–6.5)
NEUT%: 62.8 % (ref 38.4–76.8)
Platelets: 104 10*3/uL — ABNORMAL LOW (ref 145–400)
RBC: 3.29 10*6/uL — AB (ref 3.70–5.45)
RDW: 14.4 % (ref 11.2–14.5)
WBC: 1.9 10*3/uL — ABNORMAL LOW (ref 3.9–10.3)

## 2014-01-22 LAB — HOLD TUBE, BLOOD BANK

## 2014-01-22 MED ORDER — ONDANSETRON HCL 8 MG PO TABS
ORAL_TABLET | ORAL | Status: AC
  Filled 2014-01-22: qty 1

## 2014-01-22 MED ORDER — ONDANSETRON HCL 8 MG PO TABS
8.0000 mg | ORAL_TABLET | Freq: Once | ORAL | Status: AC
  Administered 2014-01-22: 8 mg via ORAL

## 2014-01-22 MED ORDER — AZACITIDINE CHEMO SQ INJECTION
75.0000 mg/m2 | Freq: Once | INTRAMUSCULAR | Status: AC
  Administered 2014-01-22: 125 mg via SUBCUTANEOUS
  Filled 2014-01-22: qty 5

## 2014-01-22 NOTE — Progress Notes (Signed)
Pt has not had labs drawn prior to arriving to infusion room. Per Dr. Alvy Bimler, draw CBC, Cmet, Type and screen. OK to treat without results but wait for results prior to discharge.   Dr. Alvy Bimler aware of lab results today. Pt does not require transfusion today. Cmet results pending at this time. OK to discharge home. Pt notified of lab results and verbalizes understanding.

## 2014-01-22 NOTE — Patient Instructions (Signed)
[facility] Discharge Instructions for Patients Receiving Chemotherapy  Today you received the following chemotherapy agent: Vidaza   To help prevent nausea and vomiting after your treatment, we encourage you to take your nausea medication as prescribed.    If you develop nausea and vomiting that is not controlled by your nausea medication, call the clinic.   BELOW ARE SYMPTOMS THAT SHOULD BE REPORTED IMMEDIATELY:  *FEVER GREATER THAN 100.5 F  *CHILLS WITH OR WITHOUT FEVER  NAUSEA AND VOMITING THAT IS NOT CONTROLLED WITH YOUR NAUSEA MEDICATION  *UNUSUAL SHORTNESS OF BREATH  *UNUSUAL BRUISING OR BLEEDING  TENDERNESS IN MOUTH AND THROAT WITH OR WITHOUT PRESENCE OF ULCERS  *URINARY PROBLEMS  *BOWEL PROBLEMS  UNUSUAL RASH Items with * indicate a potential emergency and should be followed up as soon as possible.  Feel free to call the clinic you have any questions or concerns. The clinic phone number is [phone].    

## 2014-01-23 ENCOUNTER — Telehealth: Payer: Self-pay | Admitting: *Deleted

## 2014-01-23 ENCOUNTER — Ambulatory Visit (HOSPITAL_BASED_OUTPATIENT_CLINIC_OR_DEPARTMENT_OTHER): Payer: Medicare Other

## 2014-01-23 VITALS — BP 131/53 | HR 85 | Temp 98.0°F | Resp 18

## 2014-01-23 DIAGNOSIS — D46C Myelodysplastic syndrome with isolated del(5q) chromosomal abnormality: Secondary | ICD-10-CM

## 2014-01-23 DIAGNOSIS — Z5111 Encounter for antineoplastic chemotherapy: Secondary | ICD-10-CM

## 2014-01-23 MED ORDER — FUROSEMIDE 20 MG PO TABS: 20.0000 mg | ORAL_TABLET | Freq: Every day | ORAL | Status: DC

## 2014-01-23 MED ORDER — ONDANSETRON HCL 8 MG PO TABS
ORAL_TABLET | ORAL | Status: AC
  Filled 2014-01-23: qty 1

## 2014-01-23 MED ORDER — AZACITIDINE CHEMO SQ INJECTION
75.0000 mg/m2 | Freq: Once | INTRAMUSCULAR | Status: AC
  Administered 2014-01-23: 125 mg via SUBCUTANEOUS
  Filled 2014-01-23: qty 5

## 2014-01-23 MED ORDER — ONDANSETRON HCL 8 MG PO TABS
8.0000 mg | ORAL_TABLET | Freq: Once | ORAL | Status: AC
  Administered 2014-01-23: 8 mg via ORAL

## 2014-01-23 NOTE — Telephone Encounter (Signed)
Left VM for dau to return nurse's call.

## 2014-01-23 NOTE — Telephone Encounter (Signed)
Pt reported to Chemo RN she is still having swelling in her feet, which has not improved since stopping prednisone.  Chemo RN reports pedal edema 1+ this morning.  Pt states it gets worse at the end of the day.  Pt asks if anything else to do for the swelling?  She also wants to make sure still ok to get [facility] placed tomorrow as scheduled.

## 2014-01-23 NOTE — Patient Instructions (Signed)
[facility] Discharge Instructions for Patients Receiving Chemotherapy  Today you received the following chemotherapy agents vidaza   To help prevent nausea and vomiting after your treatment, we encourage you to take your nausea medication as directed. If you develop nausea and vomiting that is not controlled by your nausea medication, call the clinic.   BELOW ARE SYMPTOMS THAT SHOULD BE REPORTED IMMEDIATELY:  *FEVER GREATER THAN 100.5 F  *CHILLS WITH OR WITHOUT FEVER  NAUSEA AND VOMITING THAT IS NOT CONTROLLED WITH YOUR NAUSEA MEDICATION  *UNUSUAL SHORTNESS OF BREATH  *UNUSUAL BRUISING OR BLEEDING  TENDERNESS IN MOUTH AND THROAT WITH OR WITHOUT PRESENCE OF ULCERS  *URINARY PROBLEMS  *BOWEL PROBLEMS  UNUSUAL RASH Items with * indicate a potential emergency and should be followed up as soon as possible.  Feel free to call the clinic you have any questions or concerns. The clinic phone number is [phone].  

## 2014-01-23 NOTE — Telephone Encounter (Signed)
Instructed dau on new Rx for lasix sent to pharmacy take one pill daily for 5 days. She verbalized understanding.

## 2014-01-23 NOTE — Telephone Encounter (Signed)
Let's get her to take lasix 20 mg daily X 5 days OK to call in 30 tabs

## 2014-01-24 ENCOUNTER — Encounter (HOSPITAL_COMMUNITY): Payer: Self-pay

## 2014-01-24 ENCOUNTER — Ambulatory Visit (HOSPITAL_COMMUNITY)
Admission: RE | Admit: 2014-01-24 | Discharge: 2014-01-24 | Disposition: A | Discharge status: Home / Self Care | Payer: Medicare Other | Source: Ambulatory Visit | Attending: Hematology and Oncology | Admitting: Hematology and Oncology

## 2014-01-24 ENCOUNTER — Other Ambulatory Visit: Payer: Self-pay | Admitting: Hematology and Oncology

## 2014-01-24 DIAGNOSIS — E7800 Pure hypercholesterolemia, unspecified: Secondary | ICD-10-CM | POA: Insufficient documentation

## 2014-01-24 DIAGNOSIS — D46C Myelodysplastic syndrome with isolated del(5q) chromosomal abnormality: Secondary | ICD-10-CM

## 2014-01-24 DIAGNOSIS — D649 Anemia, unspecified: Secondary | ICD-10-CM | POA: Insufficient documentation

## 2014-01-24 DIAGNOSIS — D469 Myelodysplastic syndrome, unspecified: Secondary | ICD-10-CM | POA: Insufficient documentation

## 2014-01-24 DIAGNOSIS — E0500 Thyrotoxicosis with diffuse goiter without thyrotoxic crisis or storm: Secondary | ICD-10-CM | POA: Insufficient documentation

## 2014-01-24 DIAGNOSIS — I10 Essential (primary) hypertension: Secondary | ICD-10-CM | POA: Insufficient documentation

## 2014-01-24 LAB — CBC WITH DIFFERENTIAL/PLATELET
Basophils Absolute: 0.1 10*3/uL (ref 0.0–0.1)
Basophils Relative: 2 % — ABNORMAL HIGH (ref 0–1)
EOS ABS: 0.3 10*3/uL (ref 0.0–0.7)
EOS PCT: 11 % — AB (ref 0–5)
HCT: 26.5 % — ABNORMAL LOW (ref 36.0–46.0)
Hemoglobin: 9.3 g/dL — ABNORMAL LOW (ref 12.0–15.0)
LYMPHS ABS: 0.6 10*3/uL — AB (ref 0.7–4.0)
Lymphocytes Relative: 27 % (ref 12–46)
MCH: 29.1 pg (ref 26.0–34.0)
MCHC: 35.1 g/dL (ref 30.0–36.0)
MCV: 82.8 fL (ref 78.0–100.0)
Monocytes Absolute: 0 10*3/uL — ABNORMAL LOW (ref 0.1–1.0)
Monocytes Relative: 0 % — ABNORMAL LOW (ref 3–12)
Neutro Abs: 1.4 10*3/uL — ABNORMAL LOW (ref 1.7–7.7)
Neutrophils Relative %: 60 % (ref 43–77)
PLATELETS: 84 10*3/uL — AB (ref 150–400)
RBC: 3.2 MIL/uL — ABNORMAL LOW (ref 3.87–5.11)
RDW: 14.1 % (ref 11.5–15.5)
WBC: 2.3 10*3/uL — ABNORMAL LOW (ref 4.0–10.5)

## 2014-01-24 LAB — PROTIME-INR
INR: 1.14 (ref 0.00–1.49)
Prothrombin Time: 14.4 seconds (ref 11.6–15.2)

## 2014-01-24 LAB — APTT: aPTT: 31 seconds (ref 24–37)

## 2014-01-24 MED ORDER — FENTANYL CITRATE 0.05 MG/ML IJ SOLN
INTRAMUSCULAR | Status: AC | PRN
  Administered 2014-01-24: 100 ug via INTRAVENOUS

## 2014-01-24 MED ORDER — LIDOCAINE HCL 1 % IJ SOLN
INTRAMUSCULAR | Status: AC
  Filled 2014-01-24: qty 20

## 2014-01-24 MED ORDER — FENTANYL CITRATE 0.05 MG/ML IJ SOLN
INTRAMUSCULAR | Status: AC
  Filled 2014-01-24: qty 4

## 2014-01-24 MED ORDER — MIDAZOLAM HCL 2 MG/2ML IJ SOLN
INTRAMUSCULAR | Status: AC | PRN
  Administered 2014-01-24: 1 mg via INTRAVENOUS

## 2014-01-24 MED ORDER — MIDAZOLAM HCL 2 MG/2ML IJ SOLN
INTRAMUSCULAR | Status: AC
  Filled 2014-01-24: qty 4

## 2014-01-24 MED ORDER — SODIUM CHLORIDE 0.9 % IV SOLN
INTRAVENOUS | Status: DC
  Administered 2014-01-24: 13:00:00 via INTRAVENOUS

## 2014-01-24 MED ORDER — CEFAZOLIN SODIUM-DEXTROSE 2-3 GM-% IV SOLR
2.0000 g | Freq: Once | INTRAVENOUS | Status: AC
  Administered 2014-01-24: 2 g via INTRAVENOUS
  Filled 2014-01-24: qty 50

## 2014-01-24 MED ORDER — HEPARIN SOD (PORK) LOCK FLUSH 100 UNIT/ML IV SOLN
INTRAVENOUS | Status: AC
  Filled 2014-01-24: qty 5

## 2014-01-24 MED ORDER — HEPARIN SOD (PORK) LOCK FLUSH 100 UNIT/ML IV SOLN
500.0000 [IU] | Freq: Once | INTRAVENOUS | Status: AC
  Administered 2014-01-24: 500 [IU] via INTRAVENOUS

## 2014-01-24 NOTE — H&P (Signed)
Rebekah Cross is an 80 y.o. female.   Chief Complaint: "I'm here to get a port put in" HPI: Patient with history of anemia, poor venous access and myelodysplastic syndrome presents today for port a cath placement for chemotherapy/prn transfusions.  Past Medical History  Diagnosis Date  . Anemia, unspecified   . Hypertension   . Thyroid disease   . Hypercholesteremia   . MDS (myelodysplastic syndrome) with 5q deletion 07/11/2013    Past Surgical History  Procedure Laterality Date  . Abdominal hysterectomy    . Back surgery    . Arthroscopy right knee    . Arthroscopy left knee      Family History  Problem Relation Age of Onset  . Colon cancer Daughter   . Heart disease Maternal Grandmother   . Heart disease Paternal Grandfather    Social History:  reports that she has never smoked. She has never used smokeless tobacco. She reports that she does not drink alcohol or use illicit drugs.  Allergies:  Allergies  Allergen Reactions  . Novocain [Procaine] Swelling and Rash  . Oxycodone Nausea And Vomiting and Other (See Comments)    "jerking"  . Codeine Nausea And Vomiting and Rash    unknown  . Methimazole [Thiamazole] Rash  . Revlimid [Lenalidomide] Rash   Current outpatient prescriptions:acetaminophen (TYLENOL) 500 MG tablet, Take 1,000 mg by mouth every 6 (six) hours as needed (Pain). , Disp: , Rfl: ;  aspirin EC 81 MG tablet, Take 81 mg by mouth every morning., Disp: , Rfl: ;  cholecalciferol (VITAMIN D) 1000 UNITS tablet, Take 1,000 Units by mouth daily., Disp: , Rfl: ;  clobetasol cream (TEMOVATE) 0.05 %, Apply 7.51 application topically 2 (two) times daily., Disp: , Rfl:  furosemide (LASIX) 20 MG tablet, Take 1 tablet (20 mg total) by mouth daily., Disp: 30 tablet, Rfl: 0;  hydrOXYzine (ATARAX/VISTARIL) 25 MG tablet, Take 25 mg by mouth every 4 (four) hours as needed for itching., Disp: , Rfl: ;  LORazepam (ATIVAN) 0.5 MG tablet, Take 0.5 mg by mouth at bedtime. , Disp: , Rfl:  ;  metoprolol succinate (TOPROL-XL) 100 MG 24 hr tablet, Take 100 mg by mouth every morning. , Disp: , Rfl:  Multiple Vitamin (MULTIVITAMIN) tablet, Take 1 tablet by mouth every morning. , Disp: , Rfl: ;  nisoldipine (SULAR) 25.5 MG 24 hr tablet, Take 25.5 mg by mouth every morning. , Disp: , Rfl: ;  olmesartan-hydrochlorothiazide (BENICAR HCT) 40-25 MG per tablet, Take 1 tablet by mouth every morning. , Disp: , Rfl: ;  ondansetron (ZOFRAN) 8 MG tablet, Take 8 mg by mouth every 8 (eight) hours as needed for nausea or vomiting. , Disp: , Rfl:  potassium chloride SA (K-DUR,KLOR-CON) 20 MEQ tablet, Take 20 mEq by mouth every morning. , Disp: , Rfl: ;  pravastatin (PRAVACHOL) 40 MG tablet, Take 40 mg by mouth at bedtime. , Disp: , Rfl: ;  cholecalciferol (VITAMIN D) 400 UNITS TABS, Take 400 Units by mouth every morning. , Disp: , Rfl: ;  lidocaine-prilocaine (EMLA) cream, Apply 1 application topically as needed., Disp: 30 g, Rfl: 0 Current facility-administered medications:0.9 %  sodium chloride infusion, , Intravenous, Continuous, Ascencion Dike, PA-C, Last Rate: 50 mL/hr at 01/24/14 1305;  ceFAZolin (ANCEF) IVPB 2 g/50 mL premix, 2 g, Intravenous, Once, Ascencion Dike, PA-C Facility-Administered Medications Ordered in Other Encounters: sodium chloride 0.9 % injection 3 mL, 3 mL, Intracatheter, PRN, Heath Lark, MD   Results for orders placed during the hospital  encounter of 01/24/14 (from the past 48 hour(s))  APTT     Status: None   Collection Time    01/24/14  1:01 PM      Result Value Ref Range   aPTT 31  24 - 37 seconds  CBC WITH DIFFERENTIAL     Status: Abnormal   Collection Time    01/24/14  1:01 PM      Result Value Ref Range   WBC 2.3 (*) 4.0 - 10.5 K/uL   RBC 3.20 (*) 3.87 - 5.11 MIL/uL   Hemoglobin 9.3 (*) 12.0 - 15.0 g/dL   HCT 26.5 (*) 36.0 - 46.0 %   MCV 82.8  78.0 - 100.0 fL   MCH 29.1  26.0 - 34.0 pg   MCHC 35.1  30.0 - 36.0 g/dL   RDW 14.1  11.5 - 15.5 %   Platelets 84 (*) 150 -  400 K/uL   Comment: REPEATED TO VERIFY     SPECIMEN CHECKED FOR CLOTS     PLATELET COUNT CONFIRMED BY SMEAR     LARGE PLATELETS PRESENT   Neutrophils Relative % 60  43 - 77 %   Neutro Abs 1.4 (*) 1.7 - 7.7 K/uL   Lymphocytes Relative 27  12 - 46 %   Lymphs Abs 0.6 (*) 0.7 - 4.0 K/uL   Monocytes Relative 0 (*) 3 - 12 %   Monocytes Absolute 0.0 (*) 0.1 - 1.0 K/uL   Eosinophils Relative 11 (*) 0 - 5 %   Eosinophils Absolute 0.3  0.0 - 0.7 K/uL   Basophils Relative 2 (*) 0 - 1 %   Basophils Absolute 0.1  0.0 - 0.1 K/uL  PROTIME-INR     Status: None   Collection Time    01/24/14  1:01 PM      Result Value Ref Range   Prothrombin Time 14.4  11.6 - 15.2 seconds   INR 1.14  0.00 - 1.49   No results found.  Review of Systems  Constitutional: Negative for fever and chills.  Respiratory: Negative for cough and shortness of breath.   Cardiovascular: Negative for chest pain.  Gastrointestinal: Negative for nausea, vomiting and abdominal pain.  Musculoskeletal: Negative for back pain.  Skin:       Itchy psoriatic rash  Neurological: Negative for headaches.  Endo/Heme/Allergies: Does not bruise/bleed easily.    Blood pressure 145/55, pulse 77, temperature 97.8 F (36.6 C), temperature source Oral, resp. rate 16, SpO2 97.00%. Physical Exam  Constitutional: She is oriented to person, place, and time. She appears well-developed and well-nourished.  Cardiovascular: Normal rate and regular rhythm.   Respiratory: Effort normal.  Few bibasilar crackles  GI: Soft. Bowel sounds are normal. There is no tenderness.  Musculoskeletal: Normal range of motion. She exhibits no edema.  Neurological: She is alert and oriented to person, place, and time.  Skin:  Diffuse psoriatic rash noted     Assessment/Plan Patient with history of anemia, poor venous access and myelodysplastic syndrome presents today for port a cath placement for chemotherapy/prn transfusions. Details/risks of procedure d/w pt  with her understanding and consent.   Crissie Sickles Allred 01/24/2014, 2:27 PM

## 2014-01-24 NOTE — Procedures (Signed)
Placement of portacath.  Tip at superior cavoatrial junction and ready to use.  No immediate complication.

## 2014-01-24 NOTE — Discharge Instructions (Signed)

## 2014-01-29 ENCOUNTER — Non-Acute Institutional Stay (HOSPITAL_COMMUNITY)
Admission: AD | Admit: 2014-01-29 | Discharge: 2014-01-29 | Disposition: A | Discharge status: Home / Self Care | Payer: Medicare Other | Source: Ambulatory Visit | Attending: Hematology and Oncology | Admitting: Hematology and Oncology

## 2014-01-29 ENCOUNTER — Other Ambulatory Visit: Payer: Self-pay | Admitting: *Deleted

## 2014-01-29 ENCOUNTER — Other Ambulatory Visit (HOSPITAL_BASED_OUTPATIENT_CLINIC_OR_DEPARTMENT_OTHER): Payer: Medicare Other

## 2014-01-29 ENCOUNTER — Other Ambulatory Visit: Payer: Self-pay | Admitting: Hematology and Oncology

## 2014-01-29 DIAGNOSIS — D46C Myelodysplastic syndrome with isolated del(5q) chromosomal abnormality: Secondary | ICD-10-CM | POA: Insufficient documentation

## 2014-01-29 DIAGNOSIS — D649 Anemia, unspecified: Secondary | ICD-10-CM

## 2014-01-29 DIAGNOSIS — D630 Anemia in neoplastic disease: Secondary | ICD-10-CM

## 2014-01-29 LAB — COMPREHENSIVE METABOLIC PANEL (CC13)
ALT: 13 U/L (ref 0–55)
ANION GAP: 8 meq/L (ref 3–11)
AST: 12 U/L (ref 5–34)
Albumin: 3.5 g/dL (ref 3.5–5.0)
Alkaline Phosphatase: 104 U/L (ref 40–150)
BUN: 33.6 mg/dL — ABNORMAL HIGH (ref 7.0–26.0)
CALCIUM: 9.3 mg/dL (ref 8.4–10.4)
CHLORIDE: 108 meq/L (ref 98–109)
CO2: 27 meq/L (ref 22–29)
Creatinine: 0.9 mg/dL (ref 0.6–1.1)
Glucose: 155 mg/dl — ABNORMAL HIGH (ref 70–140)
POTASSIUM: 3.4 meq/L — AB (ref 3.5–5.1)
SODIUM: 143 meq/L (ref 136–145)
TOTAL PROTEIN: 6.4 g/dL (ref 6.4–8.3)
Total Bilirubin: 1.01 mg/dL (ref 0.20–1.20)

## 2014-01-29 LAB — CBC WITH DIFFERENTIAL/PLATELET
BASO%: 2 % (ref 0.0–2.0)
BASOS ABS: 0.1 10*3/uL (ref 0.0–0.1)
EOS ABS: 0.4 10*3/uL (ref 0.0–0.5)
EOS%: 16.4 % — ABNORMAL HIGH (ref 0.0–7.0)
HCT: 20.2 % — ABNORMAL LOW (ref 34.8–46.6)
HEMOGLOBIN: 7 g/dL — AB (ref 11.6–15.9)
LYMPH#: 0.5 10*3/uL — AB (ref 0.9–3.3)
LYMPH%: 20.9 % (ref 14.0–49.7)
MCH: 28.6 pg (ref 25.1–34.0)
MCHC: 34.7 g/dL (ref 31.5–36.0)
MCV: 82.4 fL (ref 79.5–101.0)
MONO#: 0 10*3/uL — AB (ref 0.1–0.9)
MONO%: 0.4 % (ref 0.0–14.0)
NEUT%: 60.3 % (ref 38.4–76.8)
NEUTROS ABS: 1.5 10*3/uL (ref 1.5–6.5)
NRBC: 0 % (ref 0–0)
Platelets: 78 10*3/uL — ABNORMAL LOW (ref 145–400)
RBC: 2.45 10*6/uL — AB (ref 3.70–5.45)
RDW: 14.2 % (ref 11.2–14.5)
WBC: 2.4 10*3/uL — AB (ref 3.9–10.3)

## 2014-01-29 LAB — HOLD TUBE, BLOOD BANK

## 2014-01-29 MED ORDER — SODIUM CHLORIDE 0.9 % IJ SOLN: 3.0000 mL | INTRAMUSCULAR | Status: DC | PRN

## 2014-01-29 MED ORDER — DIPHENHYDRAMINE HCL 25 MG PO CAPS
25.0000 mg | ORAL_CAPSULE | Freq: Once | ORAL | Status: AC
  Administered 2014-01-29: 25 mg via ORAL
  Filled 2014-01-29: qty 1

## 2014-01-29 MED ORDER — HEPARIN SOD (PORK) LOCK FLUSH 100 UNIT/ML IV SOLN
500.0000 [IU] | Freq: Every day | INTRAVENOUS | Status: AC | PRN
  Administered 2014-01-29: 500 [IU]
  Filled 2014-01-29: qty 5

## 2014-01-29 MED ORDER — ACETAMINOPHEN 325 MG PO TABS
650.0000 mg | ORAL_TABLET | Freq: Once | ORAL | Status: AC
  Administered 2014-01-29: 650 mg via ORAL
  Filled 2014-01-29: qty 2

## 2014-01-29 MED ORDER — SODIUM CHLORIDE 0.9 % IJ SOLN
10.0000 mL | INTRAMUSCULAR | Status: AC | PRN
  Administered 2014-01-29: 10 mL

## 2014-01-29 MED ORDER — SODIUM CHLORIDE 0.9 % IV SOLN
250.0000 mL | Freq: Once | INTRAVENOUS | Status: AC
  Administered 2014-01-29: 250 mL via INTRAVENOUS

## 2014-01-29 MED ORDER — HEPARIN SOD (PORK) LOCK FLUSH 100 UNIT/ML IV SOLN: 250.0000 [IU] | INTRAVENOUS | Status: DC | PRN

## 2014-01-29 MED ORDER — LIDOCAINE-PRILOCAINE 2.5-2.5 % EX CREA
TOPICAL_CREAM | CUTANEOUS | Status: AC
  Filled 2014-01-29: qty 5

## 2014-01-29 NOTE — Procedures (Signed)
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

## 2014-01-29 NOTE — H&P (Signed)
She is here for blood transfusion only 

## 2014-01-29 NOTE — Telephone Encounter (Signed)
Pt has new PAC and requests rx for EMLA cream.  Order was already sent to [place] by Dr. Alvy Bimler.  Instructions on use given to pt and daughter.

## 2014-01-30 LAB — TYPE AND SCREEN
ABO/RH(D): A POS
Antibody Screen: NEGATIVE
Unit division: 0
Unit division: 0

## 2014-02-02 ENCOUNTER — Telehealth: Payer: Self-pay | Admitting: *Deleted

## 2014-02-02 DIAGNOSIS — D46C Myelodysplastic syndrome with isolated del(5q) chromosomal abnormality: Secondary | ICD-10-CM

## 2014-02-02 NOTE — Telephone Encounter (Signed)
Note opened in error.

## 2014-02-02 NOTE — Telephone Encounter (Signed)
BMBx r/s from 5/11 to 5/07 per Dr. Alvy Bimler.  Notified Butch Penny in [employer].  Notified pt's daughter, Terrence Dupont of date change.  She verbalized understanding.

## 2014-02-05 ENCOUNTER — Other Ambulatory Visit: Payer: Self-pay | Admitting: Hematology and Oncology

## 2014-02-05 ENCOUNTER — Ambulatory Visit (HOSPITAL_BASED_OUTPATIENT_CLINIC_OR_DEPARTMENT_OTHER): Payer: Medicare Other

## 2014-02-05 ENCOUNTER — Non-Acute Institutional Stay (HOSPITAL_COMMUNITY)
Admission: AD | Admit: 2014-02-05 | Payer: Medicare Other | Source: Home / Self Care | Admitting: Hematology and Oncology

## 2014-02-05 ENCOUNTER — Ambulatory Visit (HOSPITAL_COMMUNITY)
Admission: RE | Admit: 2014-02-05 | Discharge: 2014-02-05 | Disposition: A | Discharge status: Home / Self Care | Payer: Medicare Other | Source: Ambulatory Visit | Attending: Hematology and Oncology | Admitting: Hematology and Oncology

## 2014-02-05 ENCOUNTER — Non-Acute Institutional Stay (HOSPITAL_COMMUNITY)
Admission: AD | Admit: 2014-02-05 | Discharge: 2014-02-05 | Disposition: A | Discharge status: Home / Self Care | Payer: Medicare Other | Attending: Hematology and Oncology | Admitting: Hematology and Oncology

## 2014-02-05 VITALS — BP 133/49 | HR 77 | Temp 96.9°F | Resp 15

## 2014-02-05 DIAGNOSIS — D469 Myelodysplastic syndrome, unspecified: Secondary | ICD-10-CM

## 2014-02-05 DIAGNOSIS — D649 Anemia, unspecified: Secondary | ICD-10-CM

## 2014-02-05 DIAGNOSIS — D46C Myelodysplastic syndrome with isolated del(5q) chromosomal abnormality: Secondary | ICD-10-CM

## 2014-02-05 DIAGNOSIS — Z95828 Presence of other vascular implants and grafts: Secondary | ICD-10-CM

## 2014-02-05 LAB — CBC WITH DIFFERENTIAL/PLATELET
BASO%: 5.4 % — AB (ref 0.0–2.0)
Basophils Absolute: 0.1 10*3/uL (ref 0.0–0.1)
EOS%: 33.7 % — ABNORMAL HIGH (ref 0.0–7.0)
Eosinophils Absolute: 0.3 10*3/uL (ref 0.0–0.5)
HCT: 21.2 % — ABNORMAL LOW (ref 34.8–46.6)
HGB: 7.2 g/dL — ABNORMAL LOW (ref 11.6–15.9)
LYMPH%: 40.2 % (ref 14.0–49.7)
MCH: 28 pg (ref 25.1–34.0)
MCHC: 34 g/dL (ref 31.5–36.0)
MCV: 82.5 fL (ref 79.5–101.0)
MONO#: 0 10*3/uL — ABNORMAL LOW (ref 0.1–0.9)
MONO%: 1.1 % (ref 0.0–14.0)
NEUT#: 0.2 10*3/uL — CL (ref 1.5–6.5)
NEUT%: 19.6 % — ABNORMAL LOW (ref 38.4–76.8)
Platelets: 114 10*3/uL — ABNORMAL LOW (ref 145–400)
RBC: 2.57 10*6/uL — ABNORMAL LOW (ref 3.70–5.45)
RDW: 13.9 % (ref 11.2–14.5)
WBC: 0.9 10*3/uL — CL (ref 3.9–10.3)
lymph#: 0.4 10*3/uL — ABNORMAL LOW (ref 0.9–3.3)
nRBC: 0 % (ref 0–0)

## 2014-02-05 LAB — COMPREHENSIVE METABOLIC PANEL (CC13)
ALK PHOS: 78 U/L (ref 40–150)
ALT: 10 U/L (ref 0–55)
ANION GAP: 8 meq/L (ref 3–11)
AST: 10 U/L (ref 5–34)
Albumin: 3.4 g/dL — ABNORMAL LOW (ref 3.5–5.0)
BILIRUBIN TOTAL: 0.9 mg/dL (ref 0.20–1.20)
BUN: 26.7 mg/dL — ABNORMAL HIGH (ref 7.0–26.0)
CO2: 25 mEq/L (ref 22–29)
CREATININE: 0.8 mg/dL (ref 0.6–1.1)
Calcium: 9.3 mg/dL (ref 8.4–10.4)
Chloride: 110 mEq/L — ABNORMAL HIGH (ref 98–109)
GLUCOSE: 148 mg/dL — AB (ref 70–140)
Potassium: 3.9 mEq/L (ref 3.5–5.1)
SODIUM: 144 meq/L (ref 136–145)
TOTAL PROTEIN: 6.3 g/dL — AB (ref 6.4–8.3)

## 2014-02-05 LAB — HOLD TUBE, BLOOD BANK

## 2014-02-05 LAB — PREPARE RBC (CROSSMATCH)

## 2014-02-05 MED ORDER — SODIUM CHLORIDE 0.9 % IV SOLN
250.0000 mL | Freq: Once | INTRAVENOUS | Status: AC
Start: 2014-02-05 — End: 2014-02-05
  Administered 2014-02-05: 250 mL via INTRAVENOUS

## 2014-02-05 MED ORDER — HEPARIN SOD (PORK) LOCK FLUSH 100 UNIT/ML IV SOLN
500.0000 [IU] | Freq: Every day | INTRAVENOUS | Status: AC | PRN
  Administered 2014-02-05: 500 [IU]
  Filled 2014-02-05: qty 5

## 2014-02-05 MED ORDER — FUROSEMIDE 10 MG/ML IJ SOLN
20.0000 mg | Freq: Once | INTRAMUSCULAR | Status: AC
  Administered 2014-02-05: 20 mg via INTRAVENOUS
  Filled 2014-02-05: qty 2

## 2014-02-05 MED ORDER — SODIUM CHLORIDE 0.9 % IJ SOLN
10.0000 mL | INTRAMUSCULAR | Status: DC | PRN
  Administered 2014-02-05: 10 mL via INTRAVENOUS
  Filled 2014-02-05: qty 10

## 2014-02-05 MED ORDER — DIPHENHYDRAMINE HCL 25 MG PO CAPS
25.0000 mg | ORAL_CAPSULE | Freq: Once | ORAL | Status: AC
  Administered 2014-02-05: 25 mg via ORAL
  Filled 2014-02-05: qty 1

## 2014-02-05 MED ORDER — HEPARIN SOD (PORK) LOCK FLUSH 100 UNIT/ML IV SOLN
500.0000 [IU] | Freq: Once | INTRAVENOUS | Status: DC
  Filled 2014-02-05: qty 5

## 2014-02-05 MED ORDER — HEPARIN SOD (PORK) LOCK FLUSH 100 UNIT/ML IV SOLN: 250.0000 [IU] | INTRAVENOUS | Status: DC | PRN

## 2014-02-05 MED ORDER — SODIUM CHLORIDE 0.9 % IJ SOLN
10.0000 mL | INTRAMUSCULAR | Status: AC | PRN
  Administered 2014-02-05: 10 mL

## 2014-02-05 MED ORDER — SODIUM CHLORIDE 0.9 % IJ SOLN: 3.0000 mL | INTRAMUSCULAR | Status: DC | PRN

## 2014-02-05 MED ORDER — ACETAMINOPHEN 325 MG PO TABS
650.0000 mg | ORAL_TABLET | Freq: Once | ORAL | Status: AC
  Administered 2014-02-05: 650 mg via ORAL
  Filled 2014-02-05: qty 2

## 2014-02-05 NOTE — Patient Instructions (Signed)

## 2014-02-05 NOTE — H&P (Signed)
The patient is here for transfusion only  

## 2014-02-05 NOTE — Discharge Instructions (Signed)
Myelodysplastic Syndrome Myelodysplastic syndrome (MDS) is a group of blood diseases that affects new blood cells. This syndrome starts in the bone marrow where new blood cells are made. New blood cells are called immature cells or stem cells. With MDS, some immature cells do not grow into adult blood cells. Immature blood cells cannot live for very long in the body and they die. Over time, immature blood cells crowd out normal adult blood cells. This causes a low blood count. The bone marrow produces red blood cells which carry oxygen, white blood cells which fight infection, and platelets which help stop bleeding. If you have too few red blood cells, your blood may not have enough oxygen, and you will feel tired. If you do not have enough white blood cells, you might get infections often. If youhave too few platelets, you could bruise or bleed easily. CAUSES Sometimes, the cause is unknown. Known causes include:  Being exposed to certain drugs or chemicals.   Radiation treatment. This treatment is often given to fight cancer. Some things make it more likely that MDS will develop. These are called risk factors. They include:   Being white (Caucasian).   Being female.   Being older than 60 years.   Having been treated with cancer drugs or radiation.   Having been exposed to tobacco smoke, pesticides, lead, mercury, or the benzene chemical.  SYMPTOMS As MDS develops, the body reaches a point where it does not have enough adult blood cells. That is when symptoms usually start. Symptoms often develop slowly over time. The type of symptoms people have depends on which blood cells are affected. Symptoms may include:   Being tired all the time.   Feeling out of breath.   Having pale skin.   Getting infections often.   Having a fever often.   Bruising easily.   Bleeding that lasts longer than normal.   Having tiny spots of bleeding under the skin (petechiae). DIAGNOSIS A  physical exam is performed. Blood tests that may be performed include:  A complete blood count. This test counts the number of red, white, and platelet blood cells.   Peripheral blood smear. This test checks the number and type of blood cells. It also checks their size and shape.   Bone marrow testing. A small piece of bone marrow is examined under a microscope to look for MDS.  TREATMENT There is no cure for MDS. However, treatment is directed at relieving symptoms. Treatment can also slow down the making of immature blood cells. The type of treatment that is best for you depends on the type of MDS you have. Common treatments include:   Blood transfusions.  Medicines that:  Slow down the number of immature blood cells that are made.   Help immature cells develop into adult cells.   Make more blood cells.   Treat infections (antibiotics).  Bone marrow stem cell transplant. Your stem cells would be replaced with healthy stem cells from a donor.  HOME CARE INSTRUCTIONS  Only take medicine as prescribed by your caregiver. Follow the directions carefully. Do not start a new medicine unless your caregiver says it is okay.   Ask your caregiver if it is okay to take aspirin. Aspirin can make bleeding more likely.   Avoid dangerous activities to prevent bleeding. Ask your caregiver what activities are safe for you.   Check with your caregiver before you have any minor surgery or dental procedure.   Wash all fruits and vegetables before  eating them. Make sure meat and eggs are well-cooked. Do not eat raw fish and shellfish.   Wash your hands often. You can also use hand sanitizer. Stay away from people who are sick.   Keep all your follow-up appointments. SEEK MEDICAL CARE IF:  You feel more tired than normal.   You think you might have an infection.   You have more bruising than usual.   You have trouble catching your breath.  SEEK IMMEDIATE MEDICAL CARE IF:     You have an infection that is getting worse.   You have bleeding that you cannot stop.   You have trouble breathing.   You have chest pain.   You have a fever. Document Released: 03/22/2012 Document Reviewed: 03/22/2012 [facility] Patient Information 2014 [place], [place].

## 2014-02-05 NOTE — Procedures (Signed)
[facility]  Procedure Note  Rebekah Cross WSF:[id number] DOB: 08-Jan-1925 DOA: 02/05/2014   PCP: Natale Lay MD   Associated Diagnosis: myelodysplastic syndrome   Procedure Note: Premeds given. Patient received 2 U PRBC via Right Chest PAC.    Condition During Procedure: Patient tolerated well. VSS. No complaints.    Condition at Discharge: Patient tolerated well. VSS. No complaints. Patient in no apparent distress. Patient reports she has f/u information to follow up with her MD. Instructed patient if she experiences any changes in condition, to seek medical attention. Patient and patient's daughter acknowledge. Patient left day hospital with belongings ambulatory, escorted by daughter.    Wendie Simmer, RN  [facility]

## 2014-02-06 ENCOUNTER — Encounter (HOSPITAL_COMMUNITY): Payer: Self-pay | Admitting: Pharmacy Technician

## 2014-02-06 LAB — TYPE AND SCREEN
ABO/RH(D): A POS
Antibody Screen: NEGATIVE
UNIT DIVISION: 0
Unit division: 0

## 2014-02-08 ENCOUNTER — Other Ambulatory Visit: Payer: Self-pay | Admitting: Hematology and Oncology

## 2014-02-08 ENCOUNTER — Other Ambulatory Visit (HOSPITAL_COMMUNITY): Payer: Self-pay | Admitting: Hematology and Oncology

## 2014-02-08 ENCOUNTER — Ambulatory Visit (HOSPITAL_COMMUNITY)
Admission: RE | Admit: 2014-02-08 | Discharge: 2014-02-08 | Disposition: A | Discharge status: Home / Self Care | Payer: Medicare Other | Source: Ambulatory Visit | Attending: Hematology and Oncology | Admitting: Hematology and Oncology

## 2014-02-08 ENCOUNTER — Encounter (HOSPITAL_COMMUNITY): Payer: Self-pay

## 2014-02-08 VITALS — BP 136/52 | HR 66 | Temp 97.5°F | Resp 20 | Ht 60.0 in | Wt 135.0 lb

## 2014-02-08 DIAGNOSIS — D469 Myelodysplastic syndrome, unspecified: Secondary | ICD-10-CM

## 2014-02-08 DIAGNOSIS — D61818 Other pancytopenia: Secondary | ICD-10-CM | POA: Insufficient documentation

## 2014-02-08 DIAGNOSIS — D46C Myelodysplastic syndrome with isolated del(5q) chromosomal abnormality: Secondary | ICD-10-CM

## 2014-02-08 LAB — CBC WITH DIFFERENTIAL/PLATELET
BAND NEUTROPHILS: 0 % (ref 0–10)
Basophils Absolute: 0.1 10*3/uL (ref 0.0–0.1)
Basophils Relative: 12 % — ABNORMAL HIGH (ref 0–1)
Blasts: 0 %
EOS ABS: 0.3 10*3/uL (ref 0.0–0.7)
EOS PCT: 34 % — AB (ref 0–5)
HEMATOCRIT: 27 % — AB (ref 36.0–46.0)
HEMOGLOBIN: 9.2 g/dL — AB (ref 12.0–15.0)
Lymphocytes Relative: 47 % — ABNORMAL HIGH (ref 12–46)
Lymphs Abs: 0.5 10*3/uL — ABNORMAL LOW (ref 0.7–4.0)
MCH: 28 pg (ref 26.0–34.0)
MCHC: 34.1 g/dL (ref 30.0–36.0)
MCV: 82.1 fL (ref 78.0–100.0)
MONOS PCT: 0 % — AB (ref 3–12)
Metamyelocytes Relative: 0 %
Monocytes Absolute: 0 10*3/uL — ABNORMAL LOW (ref 0.1–1.0)
Myelocytes: 0 %
NRBC: 0 /100{WBCs}
Neutro Abs: 0.1 10*3/uL — ABNORMAL LOW (ref 1.7–7.7)
Neutrophils Relative %: 7 % — ABNORMAL LOW (ref 43–77)
Platelets: 147 10*3/uL — ABNORMAL LOW (ref 150–400)
Promyelocytes Absolute: 0 %
RBC: 3.29 MIL/uL — AB (ref 3.87–5.11)
RDW: 13.6 % (ref 11.5–15.5)
WBC: 1 10*3/uL — AB (ref 4.0–10.5)

## 2014-02-08 LAB — BONE MARROW EXAM

## 2014-02-08 MED ORDER — PREDNISONE 10 MG PO TABS: 20.0000 mg | ORAL_TABLET | Freq: Every day | ORAL | Status: DC

## 2014-02-08 MED ORDER — SODIUM CHLORIDE 0.9 % IV SOLN
Freq: Once | INTRAVENOUS | Status: AC
  Administered 2014-02-08: 07:00:00 via INTRAVENOUS

## 2014-02-08 MED ORDER — MORPHINE SULFATE 10 MG/ML IJ SOLN
INTRAMUSCULAR | Status: AC
  Filled 2014-02-08: qty 1

## 2014-02-08 MED ORDER — METHYLPREDNISOLONE SODIUM SUCC 40 MG IJ SOLR
40.0000 mg | Freq: Once | INTRAMUSCULAR | Status: AC
  Administered 2014-02-08: 40 mg via INTRAVENOUS
  Filled 2014-02-08: qty 1

## 2014-02-08 MED ORDER — MIDAZOLAM HCL 2 MG/2ML IJ SOLN
INTRAMUSCULAR | Status: AC | PRN
  Administered 2014-02-08: 4 mg via INTRAVENOUS
  Administered 2014-02-08: 1 mg via INTRAVENOUS

## 2014-02-08 MED ORDER — MIDAZOLAM HCL 10 MG/2ML IJ SOLN
INTRAMUSCULAR | Status: AC
Start: 2014-02-08 — End: 2014-02-08
  Filled 2014-02-08: qty 2

## 2014-02-08 MED ORDER — MORPHINE SULFATE 10 MG/ML IJ SOLN: 10.0000 mg | Freq: Once | INTRAMUSCULAR | Status: DC

## 2014-02-08 MED ORDER — MIDAZOLAM HCL 10 MG/2ML IJ SOLN: 10.0000 mg | Freq: Once | INTRAMUSCULAR | Status: DC

## 2014-02-08 MED ORDER — MORPHINE SULFATE 10 MG/ML IJ SOLN
INTRAMUSCULAR | Status: AC | PRN
  Administered 2014-02-08: 1 mg via INTRAVENOUS
  Administered 2014-02-08: 2 mg via INTRAVENOUS

## 2014-02-08 MED ORDER — HEPARIN SOD (PORK) LOCK FLUSH 100 UNIT/ML IV SOLN
500.0000 [IU] | INTRAVENOUS | Status: AC | PRN
  Administered 2014-02-08: 500 [IU]
  Filled 2014-02-08: qty 5

## 2014-02-08 NOTE — Sedation Documentation (Signed)
Patient is resting comfortably. 

## 2014-02-08 NOTE — Sedation Documentation (Signed)
Patient denies pain and is resting comfortably.  

## 2014-02-08 NOTE — Procedures (Signed)
Brief examination was performed. ENT: adequate airway clearance Heart: regular rate and rhythm.No Murmurs Lungs: clear to auscultation, no wheezes, normal respiratory effort  American Society of Anesthesiologists ASA scale 2  Mallampati Score of 3  Bone Marrow Biopsy and Aspiration Procedure Note   Informed consent was obtained and potential risks including bleeding, infection and pain were reviewed with the patient. I verified that the patient has been fasting since midnight.  The patient's name, date of birth, identification, consent and allergies were verified prior to the start of procedure and time out was performed.  A total of 5 mg of IV Versed and 3 mg of IV morphine were given.  The right posterior iliac crest was chosen as the site of biopsy.  The skin was prepped with Betadine solution.   8 cc of 1% lidocaine was used to provide local anaesthesia.   10 cc of bone marrow aspirate was obtained followed by 1 inch biopsy.   The procedure was tolerated well and there were no complications.  The patient was stable at the end of the procedure.  Specimens sent for flow cytometry, cytogenetics and additional studies.

## 2014-02-08 NOTE — Discharge Instructions (Signed)
Conscious Sedation, Adult, Care After Refer to this sheet in the next few weeks. These instructions provide you with information on caring for yourself after your procedure. Your health care provider may also give you more specific instructions. Your treatment has been planned according to current medical practices, but problems sometimes occur. Call your health care provider if you have any problems or questions after your procedure. WHAT TO EXPECT AFTER THE PROCEDURE  After your procedure:  You may feel sleepy, clumsy, and have poor balance for several hours.  Vomiting may occur if you eat too soon after the procedure. HOME CARE INSTRUCTIONS  Do not participate in any activities where you could become injured for at least 24 hours. Do not:  Drive.  Swim.  Ride a bicycle.  Operate heavy machinery.  Cook.  Use power tools.  Climb ladders.  Work from a high place.  Do not make important decisions or sign legal documents until you are improved.  If you vomit, drink water, juice, or soup when you can drink without vomiting. Make sure you have little or no nausea before eating solid foods.  Only take over-the-counter or prescription medicines for pain, discomfort, or fever as directed by your health care provider.  Make sure you and your family fully understand everything about the medicines given to you, including what side effects may occur.  You should not drink alcohol, take sleeping pills, or take medicines that cause drowsiness for at least 24 hours.  If you smoke, do not smoke without supervision.  If you are feeling better, you may resume normal activities 24 hours after you were sedated.  Keep all appointments with your health care provider. SEEK MEDICAL CARE IF:  Your skin is pale or bluish in color.  You continue to feel nauseous or vomit.  Your pain is getting worse and is not helped by medicine.  You have bleeding or swelling.  You are still sleepy or  feeling clumsy after 24 hours. SEEK IMMEDIATE MEDICAL CARE IF:  You develop a rash.  You have difficulty breathing.  You develop any type of allergic problem.  You have a fever. MAKE SURE YOU:  Understand these instructions.  Will watch your condition.  Will get help right away if you are not doing well or get worse. Document Released: 07/12/2013 Document Reviewed: 04/28/2013 [facility] Patient Information 2014 [place], [place]. Bone Biopsy, Open, Care After Refer to this sheet in the next few weeks. These instructions provide you with information on caring for yourself after your procedure. Your caregiver may also give you more specific instructions. Your treatment has been planned according to current medical practices, but problems sometimes occur. Call your caregiver if you have any problems or questions after your procedure. HOME CARE INSTRUCTIONS   Keep your surgery site clean and dry.  You may shower and bathe normally unless instructed otherwise by your surgeon.  Pat the wound dry and re-bandage it after cleansing or care for as instructed.  Protect the wound until your caregiver advises otherwise. Finding out the results of your test Ask when your test results will be ready. Make sure you get your test results. SEEK MEDICAL CARE IF:   You have redness, swelling, or increasing pain in the wound.  You have pus coming from the wound.  You have drainage from a wound lasting longer than 1 day.  You notice a bad smell coming from the wound or dressing.  Your wound edges break apart after sutures or staples have been  removed.  You develop persistent nausea or vomiting. SEEK IMMEDIATE MEDICAL CARE IF:   You have a fever.  You develop a rash.  You have difficulty breathing.  You develop any reaction or side effects to medicine given. Document Released: 04/10/2005 Document Revised: 07/12/2013 Document Reviewed: 02/27/2009 [facility] Patient Information 2014  [place], [place].

## 2014-02-08 NOTE — Sedation Documentation (Signed)
Family updated as to patient's status.

## 2014-02-08 NOTE — Progress Notes (Signed)
CRITICAL VALUE ALERT  Critical value received:  WBC 1.0  Date of notification:  02/08/14  Time of notification:  0829  Critical value read back:yes  Nurse who received alert:  Mardelle Matte, RN  MD notified (1st page):  Dr. Heath Lark   Time of first page:  0831  MD notified (2nd page):  Time of second page:  Responding MD:  Dr. Heath Lark  Time MD responded:  [phone]

## 2014-02-12 ENCOUNTER — Inpatient Hospital Stay (HOSPITAL_COMMUNITY)
Admission: RE | Admit: 2014-02-12 | Discharge: 2014-02-12 | Disposition: A | Discharge status: Home / Self Care | Payer: Medicare Other | Source: Ambulatory Visit

## 2014-02-12 ENCOUNTER — Ambulatory Visit (HOSPITAL_BASED_OUTPATIENT_CLINIC_OR_DEPARTMENT_OTHER): Payer: Medicare Other

## 2014-02-12 DIAGNOSIS — D46C Myelodysplastic syndrome with isolated del(5q) chromosomal abnormality: Secondary | ICD-10-CM

## 2014-02-12 LAB — CBC WITH DIFFERENTIAL/PLATELET
BASO%: 6.9 % — ABNORMAL HIGH (ref 0.0–2.0)
Basophils Absolute: 0.1 10*3/uL (ref 0.0–0.1)
EOS ABS: 0.4 10*3/uL (ref 0.0–0.5)
EOS%: 40.6 % — ABNORMAL HIGH (ref 0.0–7.0)
HCT: 24.8 % — ABNORMAL LOW (ref 34.8–46.6)
HEMOGLOBIN: 8.3 g/dL — AB (ref 11.6–15.9)
LYMPH#: 0.3 10*3/uL — AB (ref 0.9–3.3)
LYMPH%: 27.7 % (ref 14.0–49.7)
MCH: 27.5 pg (ref 25.1–34.0)
MCHC: 33.5 g/dL (ref 31.5–36.0)
MCV: 82.1 fL (ref 79.5–101.0)
MONO#: 0 10*3/uL — ABNORMAL LOW (ref 0.1–0.9)
MONO%: 0 % (ref 0.0–14.0)
NEUT%: 24.8 % — ABNORMAL LOW (ref 38.4–76.8)
NEUTROS ABS: 0.3 10*3/uL — AB (ref 1.5–6.5)
Platelets: 207 10*3/uL (ref 145–400)
RBC: 3.02 10*6/uL — ABNORMAL LOW (ref 3.70–5.45)
RDW: 13.6 % (ref 11.2–14.5)
WBC: 1 10*3/uL — ABNORMAL LOW (ref 3.9–10.3)
nRBC: 0 % (ref 0–0)

## 2014-02-12 LAB — COMPREHENSIVE METABOLIC PANEL (CC13)
ALT: 17 U/L (ref 0–55)
ANION GAP: 10 meq/L (ref 3–11)
AST: 12 U/L (ref 5–34)
Albumin: 3.6 g/dL (ref 3.5–5.0)
Alkaline Phosphatase: 74 U/L (ref 40–150)
BILIRUBIN TOTAL: 0.67 mg/dL (ref 0.20–1.20)
BUN: 24.4 mg/dL (ref 7.0–26.0)
CALCIUM: 9.7 mg/dL (ref 8.4–10.4)
CO2: 26 mEq/L (ref 22–29)
CREATININE: 0.8 mg/dL (ref 0.6–1.1)
Chloride: 107 mEq/L (ref 98–109)
Glucose: 195 mg/dl — ABNORMAL HIGH (ref 70–140)
Potassium: 3.8 mEq/L (ref 3.5–5.1)
Sodium: 143 mEq/L (ref 136–145)
Total Protein: 6.5 g/dL (ref 6.4–8.3)

## 2014-02-12 LAB — HOLD TUBE, BLOOD BANK

## 2014-02-12 MED ORDER — HEPARIN SOD (PORK) LOCK FLUSH 100 UNIT/ML IV SOLN
500.0000 [IU] | Freq: Once | INTRAVENOUS | Status: AC
  Administered 2014-02-12: 500 [IU] via INTRAVENOUS
  Filled 2014-02-12: qty 5

## 2014-02-12 MED ORDER — SODIUM CHLORIDE 0.9 % IJ SOLN
10.0000 mL | INTRAMUSCULAR | Status: DC | PRN
  Administered 2014-02-12: 10 mL via INTRAVENOUS
  Filled 2014-02-12: qty 10

## 2014-02-12 NOTE — Patient Instructions (Signed)

## 2014-02-16 LAB — CHROMOSOME ANALYSIS, BONE MARROW

## 2014-02-16 LAB — TISSUE HYBRIDIZATION (BONE MARROW)-NCBH

## 2014-02-19 ENCOUNTER — Other Ambulatory Visit: Payer: Self-pay | Admitting: *Deleted

## 2014-02-19 ENCOUNTER — Other Ambulatory Visit (HOSPITAL_BASED_OUTPATIENT_CLINIC_OR_DEPARTMENT_OTHER): Payer: Medicare Other

## 2014-02-19 ENCOUNTER — Ambulatory Visit (HOSPITAL_BASED_OUTPATIENT_CLINIC_OR_DEPARTMENT_OTHER): Payer: Medicare Other

## 2014-02-19 ENCOUNTER — Other Ambulatory Visit: Payer: Self-pay | Admitting: Hematology and Oncology

## 2014-02-19 ENCOUNTER — Telehealth: Payer: Self-pay | Admitting: Hematology and Oncology

## 2014-02-19 ENCOUNTER — Ambulatory Visit (HOSPITAL_BASED_OUTPATIENT_CLINIC_OR_DEPARTMENT_OTHER): Payer: Medicare Other | Admitting: Hematology and Oncology

## 2014-02-19 ENCOUNTER — Encounter: Payer: Self-pay | Admitting: Hematology and Oncology

## 2014-02-19 VITALS — BP 149/55 | HR 65 | Temp 97.9°F | Resp 24

## 2014-02-19 VITALS — BP 133/40 | HR 78 | Temp 97.4°F | Resp 20 | Ht 60.0 in | Wt 139.1 lb

## 2014-02-19 DIAGNOSIS — D469 Myelodysplastic syndrome, unspecified: Secondary | ICD-10-CM

## 2014-02-19 DIAGNOSIS — Z5111 Encounter for antineoplastic chemotherapy: Secondary | ICD-10-CM

## 2014-02-19 DIAGNOSIS — D46C Myelodysplastic syndrome with isolated del(5q) chromosomal abnormality: Secondary | ICD-10-CM

## 2014-02-19 DIAGNOSIS — D649 Anemia, unspecified: Secondary | ICD-10-CM

## 2014-02-19 DIAGNOSIS — D630 Anemia in neoplastic disease: Secondary | ICD-10-CM

## 2014-02-19 DIAGNOSIS — L408 Other psoriasis: Secondary | ICD-10-CM

## 2014-02-19 DIAGNOSIS — D702 Other drug-induced agranulocytosis: Secondary | ICD-10-CM

## 2014-02-19 LAB — COMPREHENSIVE METABOLIC PANEL (CC13)
ALT: 16 U/L (ref 0–55)
ANION GAP: 12 meq/L — AB (ref 3–11)
AST: 11 U/L (ref 5–34)
Albumin: 3.5 g/dL (ref 3.5–5.0)
Alkaline Phosphatase: 70 U/L (ref 40–150)
BUN: 30.6 mg/dL — ABNORMAL HIGH (ref 7.0–26.0)
CALCIUM: 9.5 mg/dL (ref 8.4–10.4)
CHLORIDE: 109 meq/L (ref 98–109)
CO2: 24 meq/L (ref 22–29)
Creatinine: 0.8 mg/dL (ref 0.6–1.1)
GLUCOSE: 164 mg/dL — AB (ref 70–140)
Potassium: 3.4 mEq/L — ABNORMAL LOW (ref 3.5–5.1)
Sodium: 145 mEq/L (ref 136–145)
Total Bilirubin: 0.75 mg/dL (ref 0.20–1.20)
Total Protein: 6.3 g/dL — ABNORMAL LOW (ref 6.4–8.3)

## 2014-02-19 LAB — CBC WITH DIFFERENTIAL/PLATELET
BASO%: 1.9 % (ref 0.0–2.0)
BASOS ABS: 0 10*3/uL (ref 0.0–0.1)
EOS ABS: 0.1 10*3/uL (ref 0.0–0.5)
EOS%: 5.7 % (ref 0.0–7.0)
HEMATOCRIT: 22.4 % — AB (ref 34.8–46.6)
HEMOGLOBIN: 7.5 g/dL — AB (ref 11.6–15.9)
LYMPH#: 0.5 10*3/uL — AB (ref 0.9–3.3)
LYMPH%: 22.1 % (ref 14.0–49.7)
MCH: 27.7 pg (ref 25.1–34.0)
MCHC: 33.7 g/dL (ref 31.5–36.0)
MCV: 82.4 fL (ref 79.5–101.0)
MONO#: 0 10*3/uL — ABNORMAL LOW (ref 0.1–0.9)
MONO%: 1.7 % (ref 0.0–14.0)
NEUT#: 1.5 10*3/uL (ref 1.5–6.5)
NEUT%: 68.6 % (ref 38.4–76.8)
Platelets: 207 10*3/uL (ref 145–400)
RBC: 2.72 10*6/uL — ABNORMAL LOW (ref 3.70–5.45)
RDW: 13.7 % (ref 11.2–14.5)
WBC: 2.1 10*3/uL — ABNORMAL LOW (ref 3.9–10.3)

## 2014-02-19 LAB — HOLD TUBE, BLOOD BANK

## 2014-02-19 LAB — PREPARE RBC (CROSSMATCH)

## 2014-02-19 MED ORDER — HEPARIN SOD (PORK) LOCK FLUSH 100 UNIT/ML IV SOLN
500.0000 [IU] | Freq: Once | INTRAVENOUS | Status: AC | PRN
  Administered 2014-02-19: 500 [IU]
  Filled 2014-02-19: qty 5

## 2014-02-19 MED ORDER — DIPHENHYDRAMINE HCL 25 MG PO CAPS
25.0000 mg | ORAL_CAPSULE | Freq: Once | ORAL | Status: AC
  Administered 2014-02-19: 25 mg via ORAL

## 2014-02-19 MED ORDER — AZACITIDINE CHEMO INJECTION 100 MG
75.0000 mg/m2 | Freq: Every day | INTRAMUSCULAR | Status: DC
  Administered 2014-02-19: 120 mg via INTRAVENOUS
  Filled 2014-02-19: qty 24

## 2014-02-19 MED ORDER — SODIUM CHLORIDE 0.9 % IV SOLN
Freq: Once | INTRAVENOUS | Status: AC
  Administered 2014-02-19: 09:00:00 via INTRAVENOUS

## 2014-02-19 MED ORDER — DIPHENHYDRAMINE HCL 25 MG PO CAPS
ORAL_CAPSULE | ORAL | Status: AC
  Filled 2014-02-19: qty 1

## 2014-02-19 MED ORDER — SODIUM CHLORIDE 0.9 % IJ SOLN
10.0000 mL | INTRAMUSCULAR | Status: DC | PRN
  Administered 2014-02-19: 10 mL
  Filled 2014-02-19: qty 10

## 2014-02-19 MED ORDER — ACETAMINOPHEN 325 MG PO TABS
ORAL_TABLET | ORAL | Status: AC
  Filled 2014-02-19: qty 2

## 2014-02-19 MED ORDER — ONDANSETRON 8 MG/50ML IVPB (CHCC)
8.0000 mg | Freq: Once | INTRAVENOUS | Status: AC
  Administered 2014-02-19: 8 mg via INTRAVENOUS

## 2014-02-19 MED ORDER — ACETAMINOPHEN 325 MG PO TABS
650.0000 mg | ORAL_TABLET | Freq: Once | ORAL | Status: AC
  Administered 2014-02-19: 650 mg via ORAL

## 2014-02-19 MED ORDER — ONDANSETRON 8 MG/NS 50 ML IVPB
INTRAVENOUS | Status: AC
  Filled 2014-02-19: qty 8

## 2014-02-19 MED ORDER — SODIUM CHLORIDE 0.9 % IV SOLN
250.0000 mL | Freq: Once | INTRAVENOUS | Status: AC
  Administered 2014-02-19: 250 mL via INTRAVENOUS

## 2014-02-19 MED ORDER — FUROSEMIDE 10 MG/ML IJ SOLN
20.0000 mg | Freq: Once | INTRAMUSCULAR | Status: AC
  Administered 2014-02-19: 20 mg via INTRAVENOUS

## 2014-02-19 NOTE — Telephone Encounter (Signed)
Dau states [facility] never received Rx for EMLA cream.  Called Rx into [facility]. S/w pharmacist and daughter notified.

## 2014-02-19 NOTE — Patient Instructions (Addendum)
[facility] Discharge Instructions for Patients Receiving Chemotherapy  Today you received the following chemotherapy agents:  Vidaza  To help prevent nausea and vomiting after your treatment, we encourage you to take your nausea medication as ordered per MD.   If you develop nausea and vomiting that is not controlled by your nausea medication, call the clinic.   BELOW ARE SYMPTOMS THAT SHOULD BE REPORTED IMMEDIATELY:  *FEVER GREATER THAN 100.5 F  *CHILLS WITH OR WITHOUT FEVER  NAUSEA AND VOMITING THAT IS NOT CONTROLLED WITH YOUR NAUSEA MEDICATION  *UNUSUAL SHORTNESS OF BREATH  *UNUSUAL BRUISING OR BLEEDING  TENDERNESS IN MOUTH AND THROAT WITH OR WITHOUT PRESENCE OF ULCERS  *URINARY PROBLEMS  *BOWEL PROBLEMS  UNUSUAL RASH Items with * indicate a potential emergency and should be followed up as soon as possible.  Feel free to call the clinic should you have any questions or concerns. The clinic phone number is [phone].  It has been a pleasure to serve you today!

## 2014-02-19 NOTE — Progress Notes (Signed)
[facility] OFFICE PROGRESS NOTE  Patient Care Team: Mayra Neer, MD as PCP - General (Family Medicine) Heath Lark, MD as Consulting Physician (Hematology and Oncology)  DIAGNOSIS: Myelodysplastic syndrome with deletion 5Q  SUMMARY OF ONCOLOGIC HISTORY: This is a very pleasant 80 year old lady who become transfusion dependent over the last 3 months. She is transfusion dependent. She denies any bleeding such as epistaxis, hematuria, or hematochezia. 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 Erythropoietin stimulating agents were stopped when her erythropoietin level came back over 500 In October 2014, she was started on Revlimid however develop severe allergic reaction to Revlimid. That was discontinued. On 08/07/2013, we started her on Vidaza for 2 cycles. On 10/04/2013, repeat a bone marrow aspirate and biopsy showed persistent disease. Additional testing revealed that her disease is JAK 2 positive On 11/06/2013, Vidaza is resumed after her second opinion from [facility] On 02/08/2014, repeat bone marrow aspirate and biopsy showed no evidence of increased blasts INTERVAL HISTORY: LELIANA Cross 80 y.o. female returns for further followup. She has significant break out of her psoriasis rash last week and was placed on prednisone. This is improving. Denies any recent infection. She has intermittent leg swelling but is improved with prednisone. The patient denies any recent signs or symptoms of bleeding such as spontaneous epistaxis, hematuria or hematochezia.   I have reviewed the past medical history, past surgical history, social history and family history with the patient and they are unchanged from previous note.  ALLERGIES:  is allergic to novocain; oxycodone; codeine; methimazole; and revlimid.  MEDICATIONS:  Current Outpatient Prescriptions  Medication Sig  Dispense Refill  . acetaminophen (TYLENOL) 500 MG tablet Take 1,000 mg by mouth every 6 (six) hours as needed (Pain).       Marland Kitchen aspirin EC 81 MG tablet Take 81 mg by mouth every morning.      . cholecalciferol (VITAMIN D) 1000 UNITS tablet Take 1,000 Units by mouth daily.      . clobetasol cream (TEMOVATE) 0.05 % Apply 6.62 application topically 2 (two) times daily.      . hydrOXYzine (ATARAX/VISTARIL) 25 MG tablet Take 25 mg by mouth every 4 (four) hours as needed for itching.      . lidocaine-prilocaine (EMLA) cream Apply 1 application topically as needed.  30 g  0  . LORazepam (ATIVAN) 0.5 MG tablet Take 0.5 mg by mouth at bedtime.       . metoprolol succinate (TOPROL-XL) 100 MG 24 hr tablet Take 100 mg by mouth every morning.       . Multiple Vitamin (MULTIVITAMIN) tablet Take 1 tablet by mouth every morning.       . nisoldipine (SULAR) 25.5 MG 24 hr tablet Take 25.5 mg by mouth every morning.       . olmesartan-hydrochlorothiazide (BENICAR HCT) 40-25 MG per tablet Take 1 tablet by mouth every morning.       . ondansetron (ZOFRAN) 8 MG tablet Take 8 mg by mouth every 8 (eight) hours as needed for nausea or vomiting.       . potassium chloride SA (K-DUR,KLOR-CON) 20 MEQ tablet Take 20 mEq by mouth every morning.       . pravastatin (PRAVACHOL) 40 MG tablet Take 40 mg by mouth at bedtime.       . predniSONE (DELTASONE) 10 MG tablet Take 2 tablets (20 mg total) by mouth daily  with breakfast.  60 tablet  3   No current facility-administered medications for this visit.   Facility-Administered Medications Ordered in Other Visits  Medication Dose Route Frequency Provider Last Rate Last Dose  . 0.9 %  sodium chloride infusion  250 mL Intravenous Once Heath Lark, MD      . azaCITIDine (VIDAZA) 120 mg in lactated ringers 36 mL chemo infusion  75 mg/m2 (Treatment Plan Actual) Intravenous Daily Heath Lark, MD   120 mg at 02/19/14 1009  . furosemide (LASIX) injection 20 mg  20 mg Intravenous Once Heath Lark, MD      . heparin lock flush 100 unit/mL  500 Units Intravenous Once Heath Lark, MD      . heparin lock flush 100 unit/mL  500 Units Intracatheter Once PRN Heath Lark, MD      . sodium chloride 0.9 % injection 10 mL  10 mL Intravenous PRN Heath Lark, MD   10 mL at 02/05/14 1008  . sodium chloride 0.9 % injection 10 mL  10 mL Intravenous PRN Heath Lark, MD   10 mL at 02/12/14 1024  . sodium chloride 0.9 % injection 10 mL  10 mL Intracatheter PRN Heath Lark, MD      . sodium chloride 0.9 % injection 3 mL  3 mL Intracatheter PRN Heath Lark, MD        REVIEW OF SYSTEMS:   Constitutional: Denies fevers, chills or abnormal weight loss Eyes: Denies blurriness of vision Ears, nose, mouth, throat, and face: Denies mucositis or sore throat Respiratory: Denies cough, dyspnea or wheezes Cardiovascular: Denies palpitation, chest discomfort or lower extremity swelling Gastrointestinal:  Denies nausea, heartburn or change in bowel habits Lymphatics: Denies new lymphadenopathy or easy bruising Neurological:Denies numbness, tingling or new weaknesses Behavioral/Psych: Mood is stable, no new changes  All other systems were reviewed with the patient and are negative.  PHYSICAL EXAMINATION: ECOG PERFORMANCE STATUS: 1 - Symptomatic but completely ambulatory  Filed Vitals:   02/19/14 0840  BP: 133/40  Pulse: 78  Temp: 97.4 F (36.3 C)  Resp: 20   Filed Weights   02/19/14 0840  Weight: 139 lb 1.6 oz (63.095 kg)    GENERAL:alert, no distress and comfortable SKIN: She has significant rash throughout her body consistent with psoriasis. EYES: normal, Conjunctiva are pink and non-injected, sclera clear OROPHARYNX:no exudate, no erythema and lips, buccal mucosa, and tongue normal  NECK: supple, thyroid normal size, non-tender, without nodularity LYMPH:  no palpable lymphadenopathy in the cervical, axillary or inguinal LUNGS: clear to auscultation and percussion with normal breathing  effort HEART: regular rate & rhythm and no murmurs and no lower extremity edema ABDOMEN:abdomen soft, non-tender and normal bowel sounds Musculoskeletal:no cyanosis of digits and no clubbing  NEURO: alert & oriented x 3 with fluent speech, no focal motor/sensory deficits  LABORATORY DATA:  I have reviewed the data as listed    Component Value Date/Time   NA 145 02/19/2014 0831   NA 136 08/27/2013 1212   K 3.4* 02/19/2014 0831   K 3.4* 08/27/2013 1212   CL 102 08/27/2013 1212   CO2 24 02/19/2014 0831   CO2 23 08/27/2013 1212   GLUCOSE 164* 02/19/2014 0831   GLUCOSE 118* 08/27/2013 1212   BUN 30.6* 02/19/2014 0831   BUN 23 08/27/2013 1212   CREATININE 0.8 02/19/2014 0831   CREATININE 0.78 08/27/2013 1212   CALCIUM 9.5 02/19/2014 0831   CALCIUM 9.2 08/27/2013 1212   PROT 6.3* 02/19/2014 0831   PROT  6.6 08/27/2013 1212   ALBUMIN 3.5 02/19/2014 0831   ALBUMIN 3.8 08/27/2013 1212   AST 11 02/19/2014 0831   AST 17 08/27/2013 1212   ALT 16 02/19/2014 0831   ALT 25 08/27/2013 1212   ALKPHOS 70 02/19/2014 0831   ALKPHOS 56 08/27/2013 1212   BILITOT 0.75 02/19/2014 0831   BILITOT 1.9* 08/27/2013 1212   GFRNONAA 74* 08/27/2013 1212   GFRAA 85* 08/27/2013 1212    No results found for this basename: SPEP,  UPEP,   kappa and lambda light chains    Lab Results  Component Value Date   WBC 2.1* 02/19/2014   NEUTROABS 1.5 02/19/2014   HGB 7.5* 02/19/2014   HCT 22.4* 02/19/2014   MCV 82.4 02/19/2014   PLT 207 02/19/2014      Chemistry      Component Value Date/Time   NA 145 02/19/2014 0831   NA 136 08/27/2013 1212   K 3.4* 02/19/2014 0831   K 3.4* 08/27/2013 1212   CL 102 08/27/2013 1212   CO2 24 02/19/2014 0831   CO2 23 08/27/2013 1212   BUN 30.6* 02/19/2014 0831   BUN 23 08/27/2013 1212   CREATININE 0.8 02/19/2014 0831   CREATININE 0.78 08/27/2013 1212      Component Value Date/Time   CALCIUM 9.5 02/19/2014 0831   CALCIUM 9.2 08/27/2013 1212   ALKPHOS 70 02/19/2014 0831   ALKPHOS 56  08/27/2013 1212   AST 11 02/19/2014 0831   AST 17 08/27/2013 1212   ALT 16 02/19/2014 0831   ALT 25 08/27/2013 1212   BILITOT 0.75 02/19/2014 0831   BILITOT 1.9* 08/27/2013 1212     ASSESSMENT & PLAN:  #1 myelodysplastic syndrome I will proceed with treatment today. Recent bone marrow biopsy did not reveal increased blasts I will review her bone marrow aspirate and biopsy tomorrow at the hematology tumor board. Due to significant rash, I will change her Vidaza to be given intravenously. #2 severe anemia We discussed some of the risks, benefits, and alternatives of blood transfusions. The patient is symptomatic from anemia and the hemoglobin level is critically low.  Some of the side-effects to be expected including risks of transfusion reactions, chills, infection, syndrome of volume overload and risk of hospitalization from various reasons and the patient is willing to proceed. We will continue weekly blood work monitoring. The patient will receive blood transfusions, 2 units to keep hemoglobin above 8 g. She will receive premedication with Tylenol and Benadryl before each transfusion. #3 psoriasis I recommend prednisone taper to 20 mg daily for one week and then 10 mg next week. #4 severe neutropenia This is related to her treatment. Would continue treatment without dosage adjustment   Orders Placed This Encounter  Procedures  . CBC with Differential    Standing Status: Standing     Number of Occurrences: 22     Standing Expiration Date: 02/20/2015  . Comprehensive metabolic panel    Standing Status: Future     Number of Occurrences:      Standing Expiration Date: 02/19/2015  . Hold Tube, Blood Bank    Standing Status: Standing     Number of Occurrences: 33     Standing Expiration Date: 02/20/2015   All questions were answered. The patient knows to call the clinic with any problems, questions or concerns. No barriers to learning was detected.    Heath Lark, MD 02/19/2014 1:08  PM

## 2014-02-19 NOTE — Telephone Encounter (Signed)
gv adn printed appt sched and avs for pt for May and June....sed added tx. °

## 2014-02-19 NOTE — Progress Notes (Signed)
OK to treat with IV Vidaza today with CBC results from today per Dr. Alvy Bimler.

## 2014-02-20 ENCOUNTER — Other Ambulatory Visit: Payer: Self-pay | Admitting: Hematology and Oncology

## 2014-02-20 ENCOUNTER — Ambulatory Visit (HOSPITAL_BASED_OUTPATIENT_CLINIC_OR_DEPARTMENT_OTHER): Payer: Medicare Other

## 2014-02-20 VITALS — BP 132/46 | HR 70 | Temp 98.1°F | Resp 18

## 2014-02-20 DIAGNOSIS — D46C Myelodysplastic syndrome with isolated del(5q) chromosomal abnormality: Secondary | ICD-10-CM

## 2014-02-20 DIAGNOSIS — Z5111 Encounter for antineoplastic chemotherapy: Secondary | ICD-10-CM

## 2014-02-20 LAB — TYPE AND SCREEN
ABO/RH(D): A POS
Antibody Screen: NEGATIVE
UNIT DIVISION: 0
Unit division: 0

## 2014-02-20 MED ORDER — LACTATED RINGERS IV SOLN
75.0000 mg/m2 | Freq: Every day | INTRAVENOUS | Status: DC
  Administered 2014-02-20: 120 mg via INTRAVENOUS
  Filled 2014-02-20: qty 24

## 2014-02-20 MED ORDER — HEPARIN SOD (PORK) LOCK FLUSH 100 UNIT/ML IV SOLN
500.0000 [IU] | Freq: Once | INTRAVENOUS | Status: AC | PRN
  Administered 2014-02-20: 500 [IU]
  Filled 2014-02-20: qty 5

## 2014-02-20 MED ORDER — SODIUM CHLORIDE 0.9 % IV SOLN
Freq: Once | INTRAVENOUS | Status: AC
  Administered 2014-02-20: 10:00:00 via INTRAVENOUS

## 2014-02-20 MED ORDER — ONDANSETRON 8 MG/50ML IVPB (CHCC)
8.0000 mg | Freq: Once | INTRAVENOUS | Status: AC
  Administered 2014-02-20: 8 mg via INTRAVENOUS

## 2014-02-20 MED ORDER — ONDANSETRON 8 MG/NS 50 ML IVPB
INTRAVENOUS | Status: AC
  Filled 2014-02-20: qty 8

## 2014-02-20 MED ORDER — SODIUM CHLORIDE 0.9 % IJ SOLN
10.0000 mL | INTRAMUSCULAR | Status: DC | PRN
  Administered 2014-02-20: 10 mL
  Filled 2014-02-20: qty 10

## 2014-02-20 NOTE — Patient Instructions (Signed)
[facility] Discharge Instructions for Patients Receiving Chemotherapy  Today you received the following chemotherapy agents Vidaza  To help prevent nausea and vomiting after your treatment, we encourage you to take your nausea medication    If you develop nausea and vomiting that is not controlled by your nausea medication, call the clinic.   BELOW ARE SYMPTOMS THAT SHOULD BE REPORTED IMMEDIATELY:  *FEVER GREATER THAN 100.5 F  *CHILLS WITH OR WITHOUT FEVER  NAUSEA AND VOMITING THAT IS NOT CONTROLLED WITH YOUR NAUSEA MEDICATION  *UNUSUAL SHORTNESS OF BREATH  *UNUSUAL BRUISING OR BLEEDING  TENDERNESS IN MOUTH AND THROAT WITH OR WITHOUT PRESENCE OF ULCERS  *URINARY PROBLEMS  *BOWEL PROBLEMS  UNUSUAL RASH Items with * indicate a potential emergency and should be followed up as soon as possible.  Feel free to call the clinic you have any questions or concerns. The clinic phone number is [phone].    

## 2014-02-21 ENCOUNTER — Ambulatory Visit (HOSPITAL_BASED_OUTPATIENT_CLINIC_OR_DEPARTMENT_OTHER): Payer: Medicare Other

## 2014-02-21 VITALS — BP 133/62 | HR 68 | Temp 97.6°F | Resp 18

## 2014-02-21 DIAGNOSIS — Z5111 Encounter for antineoplastic chemotherapy: Secondary | ICD-10-CM

## 2014-02-21 DIAGNOSIS — D46C Myelodysplastic syndrome with isolated del(5q) chromosomal abnormality: Secondary | ICD-10-CM

## 2014-02-21 MED ORDER — ONDANSETRON 8 MG/50ML IVPB (CHCC)
8.0000 mg | Freq: Once | INTRAVENOUS | Status: AC
  Administered 2014-02-21: 8 mg via INTRAVENOUS

## 2014-02-21 MED ORDER — ONDANSETRON 8 MG/NS 50 ML IVPB
INTRAVENOUS | Status: AC
  Filled 2014-02-21: qty 8

## 2014-02-21 MED ORDER — SODIUM CHLORIDE 0.9 % IV SOLN
Freq: Once | INTRAVENOUS | Status: AC
  Administered 2014-02-21: 10:00:00 via INTRAVENOUS

## 2014-02-21 MED ORDER — SODIUM CHLORIDE 0.9 % IJ SOLN
10.0000 mL | INTRAMUSCULAR | Status: DC | PRN
  Administered 2014-02-21: 10 mL
  Filled 2014-02-21: qty 10

## 2014-02-21 MED ORDER — HEPARIN SOD (PORK) LOCK FLUSH 100 UNIT/ML IV SOLN
500.0000 [IU] | Freq: Once | INTRAVENOUS | Status: AC | PRN
  Administered 2014-02-21: 500 [IU]
  Filled 2014-02-21: qty 5

## 2014-02-21 MED ORDER — LACTATED RINGERS IV SOLN
75.0000 mg/m2 | Freq: Every day | INTRAVENOUS | Status: DC
  Administered 2014-02-21: 120 mg via INTRAVENOUS
  Filled 2014-02-21: qty 24

## 2014-02-21 NOTE — Patient Instructions (Signed)
[facility] Discharge Instructions for Patients Receiving Chemotherapy  Today you received the following chemotherapy agents Vidaza. To help prevent nausea and vomiting after your treatment, we encourage you to take your nausea medication as prescribed.    If you develop nausea and vomiting that is not controlled by your nausea medication, call the clinic.   BELOW ARE SYMPTOMS THAT SHOULD BE REPORTED IMMEDIATELY:  *FEVER GREATER THAN 100.5 F  *CHILLS WITH OR WITHOUT FEVER  NAUSEA AND VOMITING THAT IS NOT CONTROLLED WITH YOUR NAUSEA MEDICATION  *UNUSUAL SHORTNESS OF BREATH  *UNUSUAL BRUISING OR BLEEDING  TENDERNESS IN MOUTH AND THROAT WITH OR WITHOUT PRESENCE OF ULCERS  *URINARY PROBLEMS  *BOWEL PROBLEMS  UNUSUAL RASH Items with * indicate a potential emergency and should be followed up as soon as possible.  Feel free to call the clinic you have any questions or concerns. The clinic phone number is [phone].    

## 2014-02-22 ENCOUNTER — Ambulatory Visit (HOSPITAL_BASED_OUTPATIENT_CLINIC_OR_DEPARTMENT_OTHER): Payer: Medicare Other

## 2014-02-22 VITALS — BP 165/50 | HR 72 | Temp 98.0°F | Resp 18

## 2014-02-22 DIAGNOSIS — D46C Myelodysplastic syndrome with isolated del(5q) chromosomal abnormality: Secondary | ICD-10-CM

## 2014-02-22 MED ORDER — AZACITIDINE CHEMO INJECTION 100 MG
75.0000 mg/m2 | Freq: Every day | INTRAMUSCULAR | Status: DC
  Administered 2014-02-22: 120 mg via INTRAVENOUS
  Filled 2014-02-22: qty 24

## 2014-02-22 MED ORDER — ONDANSETRON 8 MG/NS 50 ML IVPB
INTRAVENOUS | Status: AC
  Filled 2014-02-22: qty 8

## 2014-02-22 MED ORDER — SODIUM CHLORIDE 0.9 % IV SOLN
Freq: Once | INTRAVENOUS | Status: AC
  Administered 2014-02-22: 10:00:00 via INTRAVENOUS

## 2014-02-22 MED ORDER — ONDANSETRON 8 MG/50ML IVPB (CHCC)
8.0000 mg | Freq: Once | INTRAVENOUS | Status: AC
Start: 2014-02-22 — End: 2014-02-22
  Administered 2014-02-22: 8 mg via INTRAVENOUS

## 2014-02-22 MED ORDER — HEPARIN SOD (PORK) LOCK FLUSH 100 UNIT/ML IV SOLN
500.0000 [IU] | Freq: Once | INTRAVENOUS | Status: AC | PRN
  Administered 2014-02-22: 500 [IU]
  Filled 2014-02-22: qty 5

## 2014-02-22 MED ORDER — SODIUM CHLORIDE 0.9 % IJ SOLN
10.0000 mL | INTRAMUSCULAR | Status: DC | PRN
  Administered 2014-02-22: 10 mL
  Filled 2014-02-22: qty 10

## 2014-02-22 NOTE — Patient Instructions (Signed)
[facility] Discharge Instructions for Patients Receiving Chemotherapy  Today you received the following chemotherapy agents Vidaza  To help prevent nausea and vomiting after your treatment, we encourage you to take your nausea medication    If you develop nausea and vomiting that is not controlled by your nausea medication, call the clinic.   BELOW ARE SYMPTOMS THAT SHOULD BE REPORTED IMMEDIATELY:  *FEVER GREATER THAN 100.5 F  *CHILLS WITH OR WITHOUT FEVER  NAUSEA AND VOMITING THAT IS NOT CONTROLLED WITH YOUR NAUSEA MEDICATION  *UNUSUAL SHORTNESS OF BREATH  *UNUSUAL BRUISING OR BLEEDING  TENDERNESS IN MOUTH AND THROAT WITH OR WITHOUT PRESENCE OF ULCERS  *URINARY PROBLEMS  *BOWEL PROBLEMS  UNUSUAL RASH Items with * indicate a potential emergency and should be followed up as soon as possible.  Feel free to call the clinic you have any questions or concerns. The clinic phone number is [phone].    

## 2014-02-23 ENCOUNTER — Ambulatory Visit (HOSPITAL_BASED_OUTPATIENT_CLINIC_OR_DEPARTMENT_OTHER): Payer: Medicare Other

## 2014-02-23 VITALS — BP 136/66 | HR 69 | Temp 98.3°F | Resp 18

## 2014-02-23 DIAGNOSIS — D46C Myelodysplastic syndrome with isolated del(5q) chromosomal abnormality: Secondary | ICD-10-CM

## 2014-02-23 DIAGNOSIS — Z5111 Encounter for antineoplastic chemotherapy: Secondary | ICD-10-CM

## 2014-02-23 MED ORDER — SODIUM CHLORIDE 0.9 % IV SOLN
Freq: Once | INTRAVENOUS | Status: AC
  Administered 2014-02-23: 10:00:00 via INTRAVENOUS

## 2014-02-23 MED ORDER — HEPARIN SOD (PORK) LOCK FLUSH 100 UNIT/ML IV SOLN
500.0000 [IU] | Freq: Once | INTRAVENOUS | Status: AC | PRN
  Administered 2014-02-23: 500 [IU]
  Filled 2014-02-23: qty 5

## 2014-02-23 MED ORDER — PROMETHAZINE HCL 25 MG PO TABS
25.0000 mg | ORAL_TABLET | Freq: Once | ORAL | Status: AC
  Administered 2014-02-23: 25 mg via ORAL

## 2014-02-23 MED ORDER — LACTATED RINGERS IV SOLN
75.0000 mg/m2 | Freq: Every day | INTRAVENOUS | Status: DC
  Administered 2014-02-23: 120 mg via INTRAVENOUS
  Filled 2014-02-23: qty 24

## 2014-02-23 MED ORDER — ONDANSETRON 8 MG/50ML IVPB (CHCC)
8.0000 mg | Freq: Once | INTRAVENOUS | Status: AC
  Administered 2014-02-23: 8 mg via INTRAVENOUS

## 2014-02-23 MED ORDER — ONDANSETRON 8 MG/NS 50 ML IVPB
INTRAVENOUS | Status: AC
  Filled 2014-02-23: qty 8

## 2014-02-23 MED ORDER — SODIUM CHLORIDE 0.9 % IJ SOLN
10.0000 mL | INTRAMUSCULAR | Status: DC | PRN
  Administered 2014-02-23: 10 mL
  Filled 2014-02-23: qty 10

## 2014-02-23 MED ORDER — PROMETHAZINE HCL 25 MG PO TABS
ORAL_TABLET | ORAL | Status: AC
  Filled 2014-02-23: qty 1

## 2014-02-23 NOTE — Patient Instructions (Addendum)
[facility] Discharge Instructions for Patients Receiving Chemotherapy  Today you received the following chemotherapy agents Vidaza.  To help prevent nausea and vomiting after your treatment, we encourage you to take your nausea medication.   If you develop nausea and vomiting that is not controlled by your nausea medication, call the clinic.   BELOW ARE SYMPTOMS THAT SHOULD BE REPORTED IMMEDIATELY:  *FEVER GREATER THAN 100.5 F  *CHILLS WITH OR WITHOUT FEVER  NAUSEA AND VOMITING THAT IS NOT CONTROLLED WITH YOUR NAUSEA MEDICATION  *UNUSUAL SHORTNESS OF BREATH  *UNUSUAL BRUISING OR BLEEDING  TENDERNESS IN MOUTH AND THROAT WITH OR WITHOUT PRESENCE OF ULCERS  *URINARY PROBLEMS  *BOWEL PROBLEMS  UNUSUAL RASH Items with * indicate a potential emergency and should be followed up as soon as possible.  Feel free to call the clinic you have any questions or concerns. The clinic phone number is [phone].    TAKE TUMS AS DIRECTED.

## 2014-02-27 ENCOUNTER — Encounter (HOSPITAL_COMMUNITY): Payer: Self-pay

## 2014-02-27 ENCOUNTER — Other Ambulatory Visit (HOSPITAL_BASED_OUTPATIENT_CLINIC_OR_DEPARTMENT_OTHER): Payer: Medicare Other

## 2014-02-27 DIAGNOSIS — D46C Myelodysplastic syndrome with isolated del(5q) chromosomal abnormality: Secondary | ICD-10-CM

## 2014-02-27 LAB — CBC WITH DIFFERENTIAL/PLATELET
BASO%: 1.7 % (ref 0.0–2.0)
BASOS ABS: 0 10*3/uL (ref 0.0–0.1)
EOS ABS: 0.2 10*3/uL (ref 0.0–0.5)
EOS%: 6.1 % (ref 0.0–7.0)
HEMATOCRIT: 26.5 % — AB (ref 34.8–46.6)
HEMOGLOBIN: 8.7 g/dL — AB (ref 11.6–15.9)
LYMPH#: 0.5 10*3/uL — AB (ref 0.9–3.3)
LYMPH%: 17.1 % (ref 14.0–49.7)
MCH: 27.8 pg (ref 25.1–34.0)
MCHC: 33 g/dL (ref 31.5–36.0)
MCV: 84.2 fL (ref 79.5–101.0)
MONO#: 0 10*3/uL — ABNORMAL LOW (ref 0.1–0.9)
MONO%: 0.7 % (ref 0.0–14.0)
NEUT#: 2 10*3/uL (ref 1.5–6.5)
NEUT%: 74.4 % (ref 38.4–76.8)
Platelets: 90 10*3/uL — ABNORMAL LOW (ref 145–400)
RBC: 3.15 10*6/uL — ABNORMAL LOW (ref 3.70–5.45)
RDW: 13.3 % (ref 11.2–14.5)
WBC: 2.7 10*3/uL — AB (ref 3.9–10.3)

## 2014-02-27 LAB — HOLD TUBE, BLOOD BANK

## 2014-03-05 ENCOUNTER — Other Ambulatory Visit: Payer: Self-pay | Admitting: *Deleted

## 2014-03-05 ENCOUNTER — Other Ambulatory Visit (HOSPITAL_BASED_OUTPATIENT_CLINIC_OR_DEPARTMENT_OTHER): Payer: Medicare Other

## 2014-03-05 ENCOUNTER — Ambulatory Visit (HOSPITAL_COMMUNITY)
Admission: RE | Admit: 2014-03-05 | Discharge: 2014-03-05 | Disposition: A | Discharge status: Home / Self Care | Payer: Medicare Other | Source: Ambulatory Visit | Attending: Hematology and Oncology | Admitting: Hematology and Oncology

## 2014-03-05 ENCOUNTER — Ambulatory Visit (HOSPITAL_BASED_OUTPATIENT_CLINIC_OR_DEPARTMENT_OTHER): Payer: Medicare Other

## 2014-03-05 VITALS — BP 148/44 | HR 71 | Temp 98.4°F | Resp 18

## 2014-03-05 DIAGNOSIS — D649 Anemia, unspecified: Secondary | ICD-10-CM

## 2014-03-05 DIAGNOSIS — D46C Myelodysplastic syndrome with isolated del(5q) chromosomal abnormality: Secondary | ICD-10-CM | POA: Insufficient documentation

## 2014-03-05 DIAGNOSIS — D469 Myelodysplastic syndrome, unspecified: Secondary | ICD-10-CM

## 2014-03-05 LAB — CBC WITH DIFFERENTIAL/PLATELET
BASO%: 1 % (ref 0.0–2.0)
Basophils Absolute: 0 10*3/uL (ref 0.0–0.1)
EOS%: 12 % — AB (ref 0.0–7.0)
Eosinophils Absolute: 0.3 10*3/uL (ref 0.0–0.5)
HEMATOCRIT: 19.8 % — AB (ref 34.8–46.6)
HGB: 6.6 g/dL — CL (ref 11.6–15.9)
LYMPH#: 0.4 10*3/uL — AB (ref 0.9–3.3)
LYMPH%: 15.7 % (ref 14.0–49.7)
MCH: 27.9 pg (ref 25.1–34.0)
MCHC: 33.1 g/dL (ref 31.5–36.0)
MCV: 84.1 fL (ref 79.5–101.0)
MONO#: 0 10*3/uL — ABNORMAL LOW (ref 0.1–0.9)
MONO%: 0.4 % (ref 0.0–14.0)
NEUT#: 2 10*3/uL (ref 1.5–6.5)
NEUT%: 70.9 % (ref 38.4–76.8)
Platelets: 95 10*3/uL — ABNORMAL LOW (ref 145–400)
RBC: 2.35 10*6/uL — ABNORMAL LOW (ref 3.70–5.45)
RDW: 13.2 % (ref 11.2–14.5)
WBC: 2.9 10*3/uL — ABNORMAL LOW (ref 3.9–10.3)

## 2014-03-05 LAB — PREPARE RBC (CROSSMATCH)

## 2014-03-05 LAB — HOLD TUBE, BLOOD BANK

## 2014-03-05 MED ORDER — SODIUM CHLORIDE 0.9 % IV SOLN
250.0000 mL | Freq: Once | INTRAVENOUS | Status: AC
  Administered 2014-03-05: 250 mL via INTRAVENOUS

## 2014-03-05 MED ORDER — ACETAMINOPHEN 325 MG PO TABS
650.0000 mg | ORAL_TABLET | Freq: Once | ORAL | Status: AC
  Administered 2014-03-05: 650 mg via ORAL

## 2014-03-05 MED ORDER — SODIUM CHLORIDE 0.9 % IJ SOLN
10.0000 mL | INTRAMUSCULAR | Status: AC | PRN
  Administered 2014-03-05: 10 mL
  Filled 2014-03-05: qty 10

## 2014-03-05 MED ORDER — HEPARIN SOD (PORK) LOCK FLUSH 100 UNIT/ML IV SOLN
500.0000 [IU] | Freq: Every day | INTRAVENOUS | Status: AC | PRN
  Administered 2014-03-05: 500 [IU]
  Filled 2014-03-05: qty 5

## 2014-03-05 MED ORDER — ACETAMINOPHEN 325 MG PO TABS
ORAL_TABLET | ORAL | Status: AC
  Filled 2014-03-05: qty 2

## 2014-03-05 MED ORDER — DIPHENHYDRAMINE HCL 25 MG PO CAPS
ORAL_CAPSULE | ORAL | Status: AC
  Filled 2014-03-05: qty 1

## 2014-03-05 MED ORDER — FUROSEMIDE 10 MG/ML IJ SOLN
20.0000 mg | Freq: Once | INTRAMUSCULAR | Status: AC
  Administered 2014-03-05: 20 mg via INTRAVENOUS

## 2014-03-05 MED ORDER — DIPHENHYDRAMINE HCL 25 MG PO CAPS
25.0000 mg | ORAL_CAPSULE | Freq: Once | ORAL | Status: AC
  Administered 2014-03-05: 25 mg via ORAL

## 2014-03-05 NOTE — Patient Instructions (Signed)
Blood Transfusion Information WHAT IS A BLOOD TRANSFUSION? A transfusion is the replacement of blood or some of its parts. Blood is made up of multiple cells which provide different functions.  Red blood cells carry oxygen and are used for blood loss replacement.  White blood cells fight against infection.  Platelets control bleeding.  Plasma helps clot blood.  Other blood products are available for specialized needs, such as hemophilia or other clotting disorders. BEFORE THE TRANSFUSION  Who gives blood for transfusions?   You may be able to donate blood to be used at a later date on yourself (autologous donation).  Relatives can be asked to donate blood. This is generally not any safer than if you have received blood from a stranger. The same precautions are taken to ensure safety when a relative's blood is donated.  Healthy volunteers who are fully evaluated to make sure their blood is safe. This is blood bank blood. Transfusion therapy is the safest it has ever been in the practice of medicine. Before blood is taken from a donor, a complete history is taken to make sure that person has no history of diseases nor engages in risky social behavior (examples are intravenous drug use or sexual activity with multiple partners). The donor's travel history is screened to minimize risk of transmitting infections, such as malaria. The donated blood is tested for signs of infectious diseases, such as HIV and hepatitis. The blood is then tested to be sure it is compatible with you in order to minimize the chance of a transfusion reaction. If you or a relative donates blood, this is often done in anticipation of surgery and is not appropriate for emergency situations. It takes many days to process the donated blood. RISKS AND COMPLICATIONS Although transfusion therapy is very safe and saves many lives, the main dangers of transfusion include:   Getting an infectious disease.  Developing a  transfusion reaction. This is an allergic reaction to something in the blood you were given. Every precaution is taken to prevent this. The decision to have a blood transfusion has been considered carefully by your caregiver before blood is given. Blood is not given unless the benefits outweigh the risks. AFTER THE TRANSFUSION  Right after receiving a blood transfusion, you will usually feel much better and more energetic. This is especially true if your red blood cells have gotten low (anemic). The transfusion raises the level of the red blood cells which carry oxygen, and this usually causes an energy increase.  The nurse administering the transfusion will monitor you carefully for complications. HOME CARE INSTRUCTIONS  No special instructions are needed after a transfusion. You may find your energy is better. Speak with your caregiver about any limitations on activity for underlying diseases you may have. SEEK MEDICAL CARE IF:   Your condition is not improving after your transfusion.  You develop redness or irritation at the intravenous (IV) site. SEEK IMMEDIATE MEDICAL CARE IF:  Any of the following symptoms occur over the next 12 hours:  Shaking chills.  You have a temperature by mouth above 102 F (38.9 C), not controlled by medicine.  Chest, back, or muscle pain.  People around you feel you are not acting correctly or are confused.  Shortness of breath or difficulty breathing.  Dizziness and fainting.  You get a rash or develop hives.  You have a decrease in urine output.  Your urine turns a dark color or changes to pink, red, or brown. Any of the following   symptoms occur over the next 10 days:  You have a temperature by mouth above 102 F (38.9 C), not controlled by medicine.  Shortness of breath.  Weakness after normal activity.  The white part of the eye turns yellow (jaundice).  You have a decrease in the amount of urine or are urinating less often.  Your  urine turns a dark color or changes to pink, red, or brown. Document Released: 09/18/2000 Document Revised: 12/14/2011 Document Reviewed: 05/07/2008 [employer] Patient Information 2014 [employer].  

## 2014-03-06 LAB — TYPE AND SCREEN
ABO/RH(D): A POS
Antibody Screen: NEGATIVE
UNIT DIVISION: 0
Unit division: 0

## 2014-03-08 ENCOUNTER — Encounter: Payer: Self-pay | Admitting: Hematology and Oncology

## 2014-03-12 ENCOUNTER — Other Ambulatory Visit: Payer: Self-pay | Admitting: *Deleted

## 2014-03-12 ENCOUNTER — Other Ambulatory Visit (HOSPITAL_BASED_OUTPATIENT_CLINIC_OR_DEPARTMENT_OTHER): Payer: Medicare Other

## 2014-03-12 ENCOUNTER — Ambulatory Visit (HOSPITAL_BASED_OUTPATIENT_CLINIC_OR_DEPARTMENT_OTHER): Payer: Medicare Other

## 2014-03-12 VITALS — BP 156/71 | HR 70 | Temp 98.0°F | Resp 16

## 2014-03-12 DIAGNOSIS — D46C Myelodysplastic syndrome with isolated del(5q) chromosomal abnormality: Secondary | ICD-10-CM

## 2014-03-12 DIAGNOSIS — D649 Anemia, unspecified: Secondary | ICD-10-CM

## 2014-03-12 LAB — CBC WITH DIFFERENTIAL/PLATELET
HCT: 22.7 % — ABNORMAL LOW (ref 34.8–46.6)
HGB: 7.5 g/dL — ABNORMAL LOW (ref 11.6–15.9)
MCH: 27.5 pg (ref 25.1–34.0)
MCHC: 33 g/dL (ref 31.5–36.0)
MCV: 83.2 fL (ref 79.5–101.0)
Platelets: 128 10*3/uL — ABNORMAL LOW (ref 145–400)
RBC: 2.73 10*6/uL — AB (ref 3.70–5.45)
RDW: 13.1 % (ref 11.2–14.5)
WBC: 0.8 10*3/uL — AB (ref 3.9–10.3)

## 2014-03-12 LAB — MANUAL DIFFERENTIAL
ALC: 0.1 10*3/uL — AB (ref 0.9–3.3)
ANC (CHCC manual diff): 0.2 10*3/uL — CL (ref 1.5–6.5)
BAND NEUTROPHILS: 8 % (ref 0–10)
Basophil: 17 % — ABNORMAL HIGH (ref 0–2)
Blasts: 0 % (ref 0–0)
EOS: 44 % — ABNORMAL HIGH (ref 0–7)
LYMPH: 13 % — AB (ref 14–49)
MONO: 1 % (ref 0–14)
Metamyelocytes: 0 % (ref 0–0)
Myelocytes: 0 % (ref 0–0)
NRBC: 0 % (ref 0–0)
Other Cell: 0 % (ref 0–0)
PLT EST: DECREASED
PROMYELO: 0 % (ref 0–0)
SEG: 17 % — ABNORMAL LOW (ref 38–77)
VARIANT LYMPH: 0 % (ref 0–0)

## 2014-03-12 LAB — PREPARE RBC (CROSSMATCH)

## 2014-03-12 LAB — HOLD TUBE, BLOOD BANK

## 2014-03-12 MED ORDER — HEPARIN SOD (PORK) LOCK FLUSH 100 UNIT/ML IV SOLN
500.0000 [IU] | Freq: Every day | INTRAVENOUS | Status: AC | PRN
  Administered 2014-03-12: 500 [IU]
  Filled 2014-03-12: qty 5

## 2014-03-12 MED ORDER — DIPHENHYDRAMINE HCL 25 MG PO CAPS
25.0000 mg | ORAL_CAPSULE | Freq: Once | ORAL | Status: AC
  Administered 2014-03-12: 25 mg via ORAL

## 2014-03-12 MED ORDER — ACETAMINOPHEN 325 MG PO TABS
ORAL_TABLET | ORAL | Status: AC
  Filled 2014-03-12: qty 2

## 2014-03-12 MED ORDER — SODIUM CHLORIDE 0.9 % IJ SOLN
10.0000 mL | INTRAMUSCULAR | Status: AC | PRN
  Administered 2014-03-12: 10 mL
  Filled 2014-03-12: qty 10

## 2014-03-12 MED ORDER — SODIUM CHLORIDE 0.9 % IV SOLN
250.0000 mL | Freq: Once | INTRAVENOUS | Status: AC
  Administered 2014-03-12: 250 mL via INTRAVENOUS

## 2014-03-12 MED ORDER — ACETAMINOPHEN 325 MG PO TABS
650.0000 mg | ORAL_TABLET | Freq: Once | ORAL | Status: AC
  Administered 2014-03-12: 650 mg via ORAL

## 2014-03-12 MED ORDER — FUROSEMIDE 10 MG/ML IJ SOLN
20.0000 mg | Freq: Once | INTRAMUSCULAR | Status: AC
  Administered 2014-03-12: 20 mg via INTRAVENOUS

## 2014-03-12 MED ORDER — DIPHENHYDRAMINE HCL 25 MG PO CAPS
ORAL_CAPSULE | ORAL | Status: AC
  Filled 2014-03-12: qty 1

## 2014-03-12 NOTE — Patient Instructions (Signed)
Blood Transfusion  A blood transfusion replaces your blood or some of its parts. Blood is replaced when you have lost blood because of surgery, an accident, or for severe blood conditions like anemia. You can donate blood to be used on yourself if you have a planned surgery. If you lose blood during that surgery, your own blood can be given back to you. Any blood given to you is checked to make sure it matches your blood type. Your temperature, blood pressure, and heart rate (vital signs) will be checked often.  GET HELP RIGHT AWAY IF:   You feel sick to your stomach (nauseous) or throw up (vomit).  You have watery poop (diarrhea).  You have shortness of breath or trouble breathing.  You have blood in your pee (urine) or have dark colored pee.  You have chest pain or tightness.  Your eyes or skin turn yellow (jaundice).  You have a temperature by mouth above 102 F (38.9 C), not controlled by medicine.  You start to shake and have chills.  You develop a a red rash (hives) or feel itchy.  You develop lightheadedness or feel confused.  You develop back, joint, or muscle pain.  You do not feel hungry (lost appetite).  You feel tired, restless, or nervous.  You develop belly (abdominal) cramps. Document Released: 12/18/2008 Document Revised: 12/14/2011 Document Reviewed: 12/18/2008 ExitCare Patient Information 2014 [employer].  

## 2014-03-13 LAB — TYPE AND SCREEN
ABO/RH(D): A POS
ANTIBODY SCREEN: NEGATIVE
UNIT DIVISION: 0
Unit division: 0

## 2014-03-19 ENCOUNTER — Ambulatory Visit (HOSPITAL_BASED_OUTPATIENT_CLINIC_OR_DEPARTMENT_OTHER): Payer: Medicare Other | Admitting: Hematology and Oncology

## 2014-03-19 ENCOUNTER — Other Ambulatory Visit (HOSPITAL_BASED_OUTPATIENT_CLINIC_OR_DEPARTMENT_OTHER): Payer: Medicare Other

## 2014-03-19 ENCOUNTER — Encounter: Payer: Self-pay | Admitting: Hematology and Oncology

## 2014-03-19 ENCOUNTER — Ambulatory Visit (HOSPITAL_BASED_OUTPATIENT_CLINIC_OR_DEPARTMENT_OTHER): Payer: Medicare Other

## 2014-03-19 ENCOUNTER — Telehealth: Payer: Self-pay | Admitting: Hematology and Oncology

## 2014-03-19 VITALS — BP 144/39 | HR 85 | Temp 97.7°F | Resp 18 | Ht 60.0 in | Wt 137.7 lb

## 2014-03-19 DIAGNOSIS — R21 Rash and other nonspecific skin eruption: Secondary | ICD-10-CM

## 2014-03-19 DIAGNOSIS — L408 Other psoriasis: Secondary | ICD-10-CM

## 2014-03-19 DIAGNOSIS — D649 Anemia, unspecified: Secondary | ICD-10-CM

## 2014-03-19 DIAGNOSIS — L409 Psoriasis, unspecified: Secondary | ICD-10-CM | POA: Insufficient documentation

## 2014-03-19 DIAGNOSIS — D701 Agranulocytosis secondary to cancer chemotherapy: Secondary | ICD-10-CM | POA: Insufficient documentation

## 2014-03-19 DIAGNOSIS — Z5111 Encounter for antineoplastic chemotherapy: Secondary | ICD-10-CM

## 2014-03-19 DIAGNOSIS — D46C Myelodysplastic syndrome with isolated del(5q) chromosomal abnormality: Secondary | ICD-10-CM

## 2014-03-19 DIAGNOSIS — R5381 Other malaise: Secondary | ICD-10-CM

## 2014-03-19 DIAGNOSIS — T451X5A Adverse effect of antineoplastic and immunosuppressive drugs, initial encounter: Secondary | ICD-10-CM

## 2014-03-19 DIAGNOSIS — D72819 Decreased white blood cell count, unspecified: Secondary | ICD-10-CM

## 2014-03-19 DIAGNOSIS — R5383 Other fatigue: Secondary | ICD-10-CM

## 2014-03-19 HISTORY — DX: Psoriasis, unspecified: L40.9

## 2014-03-19 LAB — CBC WITH DIFFERENTIAL/PLATELET
BASO%: 3.3 % — ABNORMAL HIGH (ref 0.0–2.0)
BASOS ABS: 0.1 10*3/uL (ref 0.0–0.1)
EOS%: 22.8 % — AB (ref 0.0–7.0)
Eosinophils Absolute: 0.4 10*3/uL (ref 0.0–0.5)
HCT: 25.3 % — ABNORMAL LOW (ref 34.8–46.6)
HEMOGLOBIN: 8.5 g/dL — AB (ref 11.6–15.9)
LYMPH%: 18.5 % (ref 14.0–49.7)
MCH: 28 pg (ref 25.1–34.0)
MCHC: 33.6 g/dL (ref 31.5–36.0)
MCV: 83.2 fL (ref 79.5–101.0)
MONO#: 0 10*3/uL — ABNORMAL LOW (ref 0.1–0.9)
MONO%: 1.6 % (ref 0.0–14.0)
NEUT#: 1 10*3/uL — ABNORMAL LOW (ref 1.5–6.5)
NEUT%: 53.8 % (ref 38.4–76.8)
Platelets: 198 10*3/uL (ref 145–400)
RBC: 3.04 10*6/uL — ABNORMAL LOW (ref 3.70–5.45)
RDW: 13.1 % (ref 11.2–14.5)
WBC: 1.8 10*3/uL — ABNORMAL LOW (ref 3.9–10.3)
lymph#: 0.3 10*3/uL — ABNORMAL LOW (ref 0.9–3.3)
nRBC: 0 % (ref 0–0)

## 2014-03-19 LAB — COMPREHENSIVE METABOLIC PANEL (CC13)
ALT: 19 U/L (ref 0–55)
ANION GAP: 10 meq/L (ref 3–11)
AST: 10 U/L (ref 5–34)
Albumin: 3.3 g/dL — ABNORMAL LOW (ref 3.5–5.0)
Alkaline Phosphatase: 54 U/L (ref 40–150)
BILIRUBIN TOTAL: 0.7 mg/dL (ref 0.20–1.20)
BUN: 24.8 mg/dL (ref 7.0–26.0)
CO2: 26 mEq/L (ref 22–29)
CREATININE: 0.9 mg/dL (ref 0.6–1.1)
Calcium: 9.3 mg/dL (ref 8.4–10.4)
Chloride: 106 mEq/L (ref 98–109)
GLUCOSE: 251 mg/dL — AB (ref 70–140)
Potassium: 3.6 mEq/L (ref 3.5–5.1)
Sodium: 142 mEq/L (ref 136–145)
Total Protein: 6.2 g/dL — ABNORMAL LOW (ref 6.4–8.3)

## 2014-03-19 LAB — TECHNOLOGIST REVIEW

## 2014-03-19 LAB — HOLD TUBE, BLOOD BANK

## 2014-03-19 MED ORDER — ONDANSETRON 8 MG/NS 50 ML IVPB
INTRAVENOUS | Status: AC
  Filled 2014-03-19: qty 8

## 2014-03-19 MED ORDER — AZACITIDINE CHEMO INJECTION 100 MG
75.0000 mg/m2 | Freq: Every day | INTRAMUSCULAR | Status: DC
  Administered 2014-03-19: 120 mg via INTRAVENOUS
  Filled 2014-03-19: qty 24

## 2014-03-19 MED ORDER — PREDNISONE 10 MG PO TABS: 15.0000 mg | ORAL_TABLET | Freq: Every day | ORAL | Status: DC

## 2014-03-19 MED ORDER — HEPARIN SOD (PORK) LOCK FLUSH 100 UNIT/ML IV SOLN
500.0000 [IU] | Freq: Once | INTRAVENOUS | Status: AC | PRN
  Administered 2014-03-19: 500 [IU]
  Filled 2014-03-19: qty 5

## 2014-03-19 MED ORDER — ONDANSETRON 8 MG/50ML IVPB (CHCC)
8.0000 mg | Freq: Once | INTRAVENOUS | Status: AC
  Administered 2014-03-19: 8 mg via INTRAVENOUS

## 2014-03-19 MED ORDER — SODIUM CHLORIDE 0.9 % IV SOLN
Freq: Once | INTRAVENOUS | Status: AC
  Administered 2014-03-19: 10:00:00 via INTRAVENOUS

## 2014-03-19 MED ORDER — SODIUM CHLORIDE 0.9 % IJ SOLN
10.0000 mL | INTRAMUSCULAR | Status: DC | PRN
  Administered 2014-03-19: 10 mL
  Filled 2014-03-19: qty 10

## 2014-03-19 NOTE — Progress Notes (Signed)
[facility] OFFICE PROGRESS NOTE  Patient Care Team: Kimberlee Shaw, MD as PCP - General (Family Medicine) Ni Gorsuch, MD as Consulting Physician (Hematology and Oncology)  SUMMARY OF ONCOLOGIC HISTORY: DIAGNOSIS: Myelodysplastic syndrome with deletion 5Q  SUMMARY OF ONCOLOGIC HISTORY: This is a very pleasant 80-year-old lady who become transfusion dependent over the last 3 months. She is transfusion dependent. She denies any bleeding such as epistaxis, hematuria, or hematochezia. 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 Erythropoietin stimulating agents were stopped when her erythropoietin level came back over 500 In October 2014, she was started on Revlimid however develop severe allergic reaction to Revlimid. That was discontinued. On 08/07/2013, we started her on Vidaza for 2 cycles. On 10/04/2013, repeat a bone marrow aspirate and biopsy showed persistent disease. Additional testing revealed that her disease is JAK 2 positive On 11/06/2013, Vidaza is resumed after her second opinion from [facility] On 02/08/2014, repeat bone marrow aspirate and biopsy showed no evidence of increased blasts  INTERVAL HISTORY: Please see below for problem oriented charting. She complains of fatigue. She still has persistent skin rash that is better while on prednisone.  REVIEW OF SYSTEMS:   Constitutional: Denies fevers, chills or abnormal weight loss Eyes: Denies blurriness of vision Ears, nose, mouth, throat, and face: Denies mucositis or sore throat Respiratory: Denies cough, dyspnea or wheezes Cardiovascular: Denies palpitation, chest discomfort or lower extremity swelling Gastrointestinal:  Denies nausea, heartburn or change in bowel habits Lymphatics: Denies new lymphadenopathy or easy bruising Neurological:Denies numbness, tingling or new weaknesses Behavioral/Psych: Mood is  stable, no new changes  All other systems were reviewed with the patient and are negative.  I have reviewed the past medical history, past surgical history, social history and family history with the patient and they are unchanged from previous note.  ALLERGIES:  is allergic to novocain; oxycodone; codeine; methimazole; and revlimid.  MEDICATIONS:  Current Outpatient Prescriptions  Medication Sig Dispense Refill  . acetaminophen (TYLENOL) 500 MG tablet Take 1,000 mg by mouth every 6 (six) hours as needed (Pain).       . aspirin EC 81 MG tablet Take 81 mg by mouth every morning.      . cholecalciferol (VITAMIN D) 1000 UNITS tablet Take 1,000 Units by mouth daily.      . clobetasol cream (TEMOVATE) 0.05 % Apply 0.05 application topically 2 (two) times daily.      . hydrOXYzine (ATARAX/VISTARIL) 25 MG tablet Take 25 mg by mouth every 4 (four) hours as needed for itching.      . hydrOXYzine (ATARAX/VISTARIL) 25 MG tablet TAKE ONE TABLET BY MOUTH EVERY 4 HOURS AS NEEDED FOR ITCHING  60 tablet  4  . lidocaine-prilocaine (EMLA) cream Apply 1 application topically as needed.  30 g  0  . LORazepam (ATIVAN) 0.5 MG tablet Take 0.5 mg by mouth at bedtime.       . metoprolol succinate (TOPROL-XL) 100 MG 24 hr tablet Take 100 mg by mouth every morning.       . Multiple Vitamin (MULTIVITAMIN) tablet Take 1 tablet by mouth every morning.       . nisoldipine (SULAR) 25.5 MG 24 hr tablet Take 25.5 mg by mouth every morning.       . olmesartan-hydrochlorothiazide (BENICAR HCT) 40-25 MG per tablet Take 1 tablet by mouth every morning.       . ondansetron (ZOFRAN) 8   MG tablet Take 8 mg by mouth every 8 (eight) hours as needed for nausea or vomiting.       . potassium chloride SA (K-DUR,KLOR-CON) 20 MEQ tablet Take 20 mEq by mouth every morning.       . pravastatin (PRAVACHOL) 40 MG tablet Take 40 mg by mouth at bedtime.       . predniSONE (DELTASONE) 10 MG tablet Take 10 mg by mouth daily with breakfast.      .  predniSONE (DELTASONE) 10 MG tablet Take 1.5 tablets (15 mg total) by mouth daily with breakfast.  60 tablet  1   No current facility-administered medications for this visit.   Facility-Administered Medications Ordered in Other Visits  Medication Dose Route Frequency Provider Last Rate Last Dose  . sodium chloride 0.9 % injection 3 mL  3 mL Intracatheter PRN Ni Gorsuch, MD        PHYSICAL EXAMINATION: ECOG PERFORMANCE STATUS: 1 - Symptomatic but completely ambulatory  Filed Vitals:   03/19/14 0857  BP: 144/39  Pulse: 85  Temp: 97.7 F (36.5 C)  Resp: 18   Filed Weights   03/19/14 0857  Weight: 137 lb 11.2 oz (62.46 kg)    GENERAL:alert, no distress and comfortable SKIN: Significant psoriatic plaque throughout.  EYES: normal, Conjunctiva are pink and non-injected, sclera clear OROPHARYNX:no exudate, no erythema and lips, buccal mucosa, and tongue normal  NECK: supple, thyroid normal size, non-tender, without nodularity LYMPH:  no palpable lymphadenopathy in the cervical, axillary or inguinal LUNGS: clear to auscultation and percussion with normal breathing effort HEART: regular rate & rhythm and no murmurs and no lower extremity edema ABDOMEN:abdomen soft, non-tender and normal bowel sounds Musculoskeletal:no cyanosis of digits and no clubbing  NEURO: alert & oriented x 3 with fluent speech, no focal motor/sensory deficits  LABORATORY DATA:  I have reviewed the data as listed    Component Value Date/Time   NA 142 03/19/2014 0832   NA 136 08/27/2013 1212   K 3.6 03/19/2014 0832   K 3.4* 08/27/2013 1212   CL 102 08/27/2013 1212   CO2 26 03/19/2014 0832   CO2 23 08/27/2013 1212   GLUCOSE 251* 03/19/2014 0832   GLUCOSE 118* 08/27/2013 1212   BUN 24.8 03/19/2014 0832   BUN 23 08/27/2013 1212   CREATININE 0.9 03/19/2014 0832   CREATININE 0.78 08/27/2013 1212   CALCIUM 9.3 03/19/2014 0832   CALCIUM 9.2 08/27/2013 1212   PROT 6.2* 03/19/2014 0832   PROT 6.6 08/27/2013 1212    ALBUMIN 3.3* 03/19/2014 0832   ALBUMIN 3.8 08/27/2013 1212   AST 10 03/19/2014 0832   AST 17 08/27/2013 1212   ALT 19 03/19/2014 0832   ALT 25 08/27/2013 1212   ALKPHOS 54 03/19/2014 0832   ALKPHOS 56 08/27/2013 1212   BILITOT 0.70 03/19/2014 0832   BILITOT 1.9* 08/27/2013 1212   GFRNONAA 74* 08/27/2013 1212   GFRAA 85* 08/27/2013 1212    No results found for this basename: SPEP,  UPEP,   kappa and lambda light chains    Lab Results  Component Value Date   WBC 1.8* 03/19/2014   NEUTROABS 1.0* 03/19/2014   HGB 8.5* 03/19/2014   HCT 25.3* 03/19/2014   MCV 83.2 03/19/2014   PLT 198 03/19/2014      Chemistry      Component Value Date/Time   NA 142 03/19/2014 0832   NA 136 08/27/2013 1212   K 3.6 03/19/2014 0832   K 3.4* 08/27/2013 1212     CL 102 08/27/2013 1212   CO2 26 03/19/2014 0832   CO2 23 08/27/2013 1212   BUN 24.8 03/19/2014 0832   BUN 23 08/27/2013 1212   CREATININE 0.9 03/19/2014 0832   CREATININE 0.78 08/27/2013 1212      Component Value Date/Time   CALCIUM 9.3 03/19/2014 0832   CALCIUM 9.2 08/27/2013 1212   ALKPHOS 54 03/19/2014 0832   ALKPHOS 56 08/27/2013 1212   AST 10 03/19/2014 0832   AST 17 08/27/2013 1212   ALT 19 03/19/2014 0832   ALT 25 08/27/2013 1212   BILITOT 0.70 03/19/2014 0832   BILITOT 1.9* 08/27/2013 1212      ASSESSMENT & PLAN:  MDS (myelodysplastic syndrome) with 5q deletion Recent bone marrow aspirate and biopsy confirmed stable disease. I recommend we continue treatment without adjustment. I plan to repeat another bone marrow biopsy at the end of the year.  Anemia This is likely due to recent treatment. The patient denies recent history of bleeding such as epistaxis, hematuria or hematochezia. She is asymptomatic from the anemia. I will observe for now.  She does not require transfusion now. I will continue the chemotherapy at current dose without dosage adjustment.  If the anemia gets progressive worse in the future, I might have to delay her  treatment or adjust the chemotherapy dose. We agreed to establish transfusion threshold to keep hemoglobin of 8 g attempt to only give her one unit of blood each time to reduce risk of iron overload. The patient will receive premedication before each transfusion.   Psoriasis The patient did not tolerate tapered dose to 10 mg in the past. I recommend reduce her prednisone to 15 mg daily.  Leukopenia due to antineoplastic chemotherapy This is likely due to recent treatment. The patient denies recent history of fevers, cough, chills, diarrhea or dysuria. She is asymptomatic from the leukopenia. I will observe for now.  I will continue the chemotherapy at current dose without dosage adjustment.  If the leukopenia gets progressive worse in the future, I might have to delay her treatment or adjust the chemotherapy dose.      Orders Placed This Encounter  Procedures  . Comprehensive metabolic panel    Standing Status: Future     Number of Occurrences:      Standing Expiration Date: 03/19/2015   All questions were answered. The patient knows to call the clinic with any problems, questions or concerns. No barriers to learning was detected.   GORSUCH, NI, MD 03/19/2014 9:06 PM    

## 2014-03-19 NOTE — Progress Notes (Signed)
Ok to treat with low ANC today per Dr. Alvy Bimler.

## 2014-03-19 NOTE — Patient Instructions (Signed)
[facility] Discharge Instructions for Patients Receiving Chemotherapy  Today you received the following chemotherapy agents Vidaza. To help prevent nausea and vomiting after your treatment, we encourage you to take your nausea medication as prescribed.    If you develop nausea and vomiting that is not controlled by your nausea medication, call the clinic.   BELOW ARE SYMPTOMS THAT SHOULD BE REPORTED IMMEDIATELY:  *FEVER GREATER THAN 100.5 F  *CHILLS WITH OR WITHOUT FEVER  NAUSEA AND VOMITING THAT IS NOT CONTROLLED WITH YOUR NAUSEA MEDICATION  *UNUSUAL SHORTNESS OF BREATH  *UNUSUAL BRUISING OR BLEEDING  TENDERNESS IN MOUTH AND THROAT WITH OR WITHOUT PRESENCE OF ULCERS  *URINARY PROBLEMS  *BOWEL PROBLEMS  UNUSUAL RASH Items with * indicate a potential emergency and should be followed up as soon as possible.  Feel free to call the clinic you have any questions or concerns. The clinic phone number is [phone].    

## 2014-03-19 NOTE — Assessment & Plan Note (Signed)
The patient did not tolerate tapered dose to 10 mg in the past. I recommend reduce her prednisone to 15 mg daily.

## 2014-03-19 NOTE — Assessment & Plan Note (Signed)
This is likely due to recent treatment. The patient denies recent history of fevers, cough, chills, diarrhea or dysuria. She is asymptomatic from the leukopenia. I will observe for now.  I will continue the chemotherapy at current dose without dosage adjustment.  If the leukopenia gets progressive worse in the future, I might have to delay her treatment or adjust the chemotherapy dose.   

## 2014-03-19 NOTE — Telephone Encounter (Signed)
gv and pritned appt sched and avs for tp fro June and July....sed added tx.

## 2014-03-19 NOTE — Assessment & Plan Note (Signed)
This is likely due to recent treatment. The patient denies recent history of bleeding such as epistaxis, hematuria or hematochezia. She is asymptomatic from the anemia. I will observe for now.  She does not require transfusion now. I will continue the chemotherapy at current dose without dosage adjustment.  If the anemia gets progressive worse in the future, I might have to delay her treatment or adjust the chemotherapy dose. We agreed to establish transfusion threshold to keep hemoglobin of 8 g attempt to only give her one unit of blood each time to reduce risk of iron overload. The patient will receive premedication before each transfusion.

## 2014-03-19 NOTE — Assessment & Plan Note (Signed)
Recent bone marrow aspirate and biopsy confirmed stable disease. I recommend we continue treatment without adjustment. I plan to repeat another bone marrow biopsy at the end of the year. 

## 2014-03-20 ENCOUNTER — Ambulatory Visit (HOSPITAL_BASED_OUTPATIENT_CLINIC_OR_DEPARTMENT_OTHER): Payer: Medicare Other

## 2014-03-20 VITALS — BP 141/68 | HR 82 | Temp 97.8°F

## 2014-03-20 DIAGNOSIS — Z5111 Encounter for antineoplastic chemotherapy: Secondary | ICD-10-CM

## 2014-03-20 DIAGNOSIS — D46C Myelodysplastic syndrome with isolated del(5q) chromosomal abnormality: Secondary | ICD-10-CM

## 2014-03-20 MED ORDER — HEPARIN SOD (PORK) LOCK FLUSH 100 UNIT/ML IV SOLN
500.0000 [IU] | Freq: Once | INTRAVENOUS | Status: AC | PRN
  Administered 2014-03-20: 500 [IU]
  Filled 2014-03-20: qty 5

## 2014-03-20 MED ORDER — ONDANSETRON 8 MG/NS 50 ML IVPB
INTRAVENOUS | Status: AC
  Filled 2014-03-20: qty 8

## 2014-03-20 MED ORDER — SODIUM CHLORIDE 0.9 % IJ SOLN
10.0000 mL | INTRAMUSCULAR | Status: DC | PRN
  Administered 2014-03-20: 10 mL
  Filled 2014-03-20: qty 10

## 2014-03-20 MED ORDER — SODIUM CHLORIDE 0.9 % IV SOLN
Freq: Once | INTRAVENOUS | Status: AC
  Administered 2014-03-20: 09:00:00 via INTRAVENOUS

## 2014-03-20 MED ORDER — ONDANSETRON 8 MG/50ML IVPB (CHCC)
8.0000 mg | Freq: Once | INTRAVENOUS | Status: AC
  Administered 2014-03-20: 8 mg via INTRAVENOUS

## 2014-03-20 MED ORDER — LACTATED RINGERS IV SOLN
75.0000 mg/m2 | Freq: Every day | INTRAVENOUS | Status: DC
  Administered 2014-03-20: 120 mg via INTRAVENOUS
  Filled 2014-03-20: qty 24

## 2014-03-20 NOTE — Patient Instructions (Signed)
[facility] Discharge Instructions for Patients Receiving Chemotherapy  Today you received the following chemotherapy agents vidaza   To help prevent nausea and vomiting after your treatment, we encourage you to take your nausea medication as directed. If you develop nausea and vomiting that is not controlled by your nausea medication, call the clinic.   BELOW ARE SYMPTOMS THAT SHOULD BE REPORTED IMMEDIATELY:  *FEVER GREATER THAN 100.5 F  *CHILLS WITH OR WITHOUT FEVER  NAUSEA AND VOMITING THAT IS NOT CONTROLLED WITH YOUR NAUSEA MEDICATION  *UNUSUAL SHORTNESS OF BREATH  *UNUSUAL BRUISING OR BLEEDING  TENDERNESS IN MOUTH AND THROAT WITH OR WITHOUT PRESENCE OF ULCERS  *URINARY PROBLEMS  *BOWEL PROBLEMS  UNUSUAL RASH Items with * indicate a potential emergency and should be followed up as soon as possible.  Feel free to call the clinic you have any questions or concerns. The clinic phone number is [phone].  

## 2014-03-21 ENCOUNTER — Ambulatory Visit (HOSPITAL_BASED_OUTPATIENT_CLINIC_OR_DEPARTMENT_OTHER): Payer: Medicare Other

## 2014-03-21 VITALS — BP 143/71 | HR 81 | Temp 98.3°F | Resp 18

## 2014-03-21 DIAGNOSIS — D46C Myelodysplastic syndrome with isolated del(5q) chromosomal abnormality: Secondary | ICD-10-CM

## 2014-03-21 MED ORDER — SODIUM CHLORIDE 0.9 % IV SOLN
Freq: Once | INTRAVENOUS | Status: AC
  Administered 2014-03-21: 10:00:00 via INTRAVENOUS

## 2014-03-21 MED ORDER — HEPARIN SOD (PORK) LOCK FLUSH 100 UNIT/ML IV SOLN
500.0000 [IU] | Freq: Once | INTRAVENOUS | Status: AC | PRN
  Administered 2014-03-21: 500 [IU]
  Filled 2014-03-21: qty 5

## 2014-03-21 MED ORDER — LACTATED RINGERS IV SOLN
75.0000 mg/m2 | Freq: Every day | INTRAVENOUS | Status: DC
  Administered 2014-03-21: 120 mg via INTRAVENOUS
  Filled 2014-03-21: qty 24

## 2014-03-21 MED ORDER — SODIUM CHLORIDE 0.9 % IJ SOLN
10.0000 mL | INTRAMUSCULAR | Status: DC | PRN
  Administered 2014-03-21: 10 mL
  Filled 2014-03-21: qty 10

## 2014-03-21 MED ORDER — ONDANSETRON 8 MG/50ML IVPB (CHCC)
8.0000 mg | Freq: Once | INTRAVENOUS | Status: AC
  Administered 2014-03-21: 8 mg via INTRAVENOUS

## 2014-03-21 MED ORDER — ONDANSETRON 8 MG/NS 50 ML IVPB
INTRAVENOUS | Status: AC
  Filled 2014-03-21: qty 8

## 2014-03-21 MED ORDER — SODIUM CHLORIDE 0.9 % IV SOLN
500.0000 mL | Freq: Once | INTRAVENOUS | Status: AC
Start: 2014-03-21 — End: 2014-03-21
  Administered 2014-03-21: 500 mL via INTRAVENOUS

## 2014-03-21 NOTE — Patient Instructions (Signed)
[facility] Discharge Instructions for Patients Receiving Chemotherapy  Today you received the following chemotherapy agent: Vidaza   To help prevent nausea and vomiting after your treatment, we encourage you to take your nausea medication as prescribed.    If you develop nausea and vomiting that is not controlled by your nausea medication, call the clinic.   BELOW ARE SYMPTOMS THAT SHOULD BE REPORTED IMMEDIATELY:  *FEVER GREATER THAN 100.5 F  *CHILLS WITH OR WITHOUT FEVER  NAUSEA AND VOMITING THAT IS NOT CONTROLLED WITH YOUR NAUSEA MEDICATION  *UNUSUAL SHORTNESS OF BREATH  *UNUSUAL BRUISING OR BLEEDING  TENDERNESS IN MOUTH AND THROAT WITH OR WITHOUT PRESENCE OF ULCERS  *URINARY PROBLEMS  *BOWEL PROBLEMS  UNUSUAL RASH Items with * indicate a potential emergency and should be followed up as soon as possible.  Feel free to call the clinic you have any questions or concerns. The clinic phone number is [phone].    

## 2014-03-21 NOTE — Progress Notes (Signed)
BP 128/38 upon arrival to infusion room. Pt reports taking her BP med this AM at home and "my head feels funny, tired or something." she ate breakfast at home and given food and drink in infusion room. Verbal order obtained from Dr. Alvy Bimler to give 500 cc NS bolus over 30 minutes to bring diastolic BP to 50. See MAR or order details. This RN to monitor.

## 2014-03-22 ENCOUNTER — Ambulatory Visit (HOSPITAL_BASED_OUTPATIENT_CLINIC_OR_DEPARTMENT_OTHER): Payer: Medicare Other

## 2014-03-22 VITALS — BP 132/56 | HR 84 | Temp 99.4°F | Resp 18

## 2014-03-22 DIAGNOSIS — Z5111 Encounter for antineoplastic chemotherapy: Secondary | ICD-10-CM

## 2014-03-22 DIAGNOSIS — D46C Myelodysplastic syndrome with isolated del(5q) chromosomal abnormality: Secondary | ICD-10-CM

## 2014-03-22 MED ORDER — ONDANSETRON 8 MG/50ML IVPB (CHCC)
8.0000 mg | Freq: Once | INTRAVENOUS | Status: AC
  Administered 2014-03-22: 8 mg via INTRAVENOUS

## 2014-03-22 MED ORDER — HEPARIN SOD (PORK) LOCK FLUSH 100 UNIT/ML IV SOLN
500.0000 [IU] | Freq: Once | INTRAVENOUS | Status: AC | PRN
  Administered 2014-03-22: 500 [IU]
  Filled 2014-03-22: qty 5

## 2014-03-22 MED ORDER — ONDANSETRON 8 MG/NS 50 ML IVPB
INTRAVENOUS | Status: AC
  Filled 2014-03-22: qty 8

## 2014-03-22 MED ORDER — SODIUM CHLORIDE 0.9 % IJ SOLN
10.0000 mL | INTRAMUSCULAR | Status: DC | PRN
  Administered 2014-03-22: 10 mL
  Filled 2014-03-22: qty 10

## 2014-03-22 MED ORDER — LACTATED RINGERS IV SOLN
75.0000 mg/m2 | Freq: Every day | INTRAVENOUS | Status: DC
  Administered 2014-03-22: 120 mg via INTRAVENOUS
  Filled 2014-03-22: qty 24

## 2014-03-22 MED ORDER — SODIUM CHLORIDE 0.9 % IV SOLN
Freq: Once | INTRAVENOUS | Status: AC
  Administered 2014-03-22: 09:00:00 via INTRAVENOUS

## 2014-03-22 NOTE — Patient Instructions (Signed)
[facility] Discharge Instructions for Patients Receiving Chemotherapy  Today you received the following chemotherapy agents Vidaza. To help prevent nausea and vomiting after your treatment, we encourage you to take your nausea medication as prescribed.    If you develop nausea and vomiting that is not controlled by your nausea medication, call the clinic.   BELOW ARE SYMPTOMS THAT SHOULD BE REPORTED IMMEDIATELY:  *FEVER GREATER THAN 100.5 F  *CHILLS WITH OR WITHOUT FEVER  NAUSEA AND VOMITING THAT IS NOT CONTROLLED WITH YOUR NAUSEA MEDICATION  *UNUSUAL SHORTNESS OF BREATH  *UNUSUAL BRUISING OR BLEEDING  TENDERNESS IN MOUTH AND THROAT WITH OR WITHOUT PRESENCE OF ULCERS  *URINARY PROBLEMS  *BOWEL PROBLEMS  UNUSUAL RASH Items with * indicate a potential emergency and should be followed up as soon as possible.  Feel free to call the clinic you have any questions or concerns. The clinic phone number is [phone].    

## 2014-03-23 ENCOUNTER — Ambulatory Visit (HOSPITAL_BASED_OUTPATIENT_CLINIC_OR_DEPARTMENT_OTHER): Payer: Medicare Other

## 2014-03-23 VITALS — BP 140/45 | HR 90 | Temp 97.8°F | Resp 17

## 2014-03-23 DIAGNOSIS — D46C Myelodysplastic syndrome with isolated del(5q) chromosomal abnormality: Secondary | ICD-10-CM

## 2014-03-23 DIAGNOSIS — Z5111 Encounter for antineoplastic chemotherapy: Secondary | ICD-10-CM

## 2014-03-23 MED ORDER — SODIUM CHLORIDE 0.9 % IJ SOLN
10.0000 mL | INTRAMUSCULAR | Status: DC | PRN
  Administered 2014-03-23: 10 mL
  Filled 2014-03-23: qty 10

## 2014-03-23 MED ORDER — ONDANSETRON 8 MG/50ML IVPB (CHCC)
8.0000 mg | Freq: Once | INTRAVENOUS | Status: AC
  Administered 2014-03-23: 8 mg via INTRAVENOUS

## 2014-03-23 MED ORDER — HEPARIN SOD (PORK) LOCK FLUSH 100 UNIT/ML IV SOLN
500.0000 [IU] | Freq: Once | INTRAVENOUS | Status: AC | PRN
  Administered 2014-03-23: 500 [IU]
  Filled 2014-03-23: qty 5

## 2014-03-23 MED ORDER — ONDANSETRON 8 MG/NS 50 ML IVPB
INTRAVENOUS | Status: AC
  Filled 2014-03-23: qty 8

## 2014-03-23 MED ORDER — LACTATED RINGERS IV SOLN
75.0000 mg/m2 | Freq: Every day | INTRAVENOUS | Status: DC
  Administered 2014-03-23: 120 mg via INTRAVENOUS
  Filled 2014-03-23: qty 24

## 2014-03-23 MED ORDER — SODIUM CHLORIDE 0.9 % IV SOLN
Freq: Once | INTRAVENOUS | Status: AC
  Administered 2014-03-23: 09:00:00 via INTRAVENOUS

## 2014-03-23 NOTE — Patient Instructions (Signed)
[facility] Discharge Instructions for Patients Receiving Chemotherapy  Today you received the following chemotherapy agents: Vidaza  To help prevent nausea and vomiting after your treatment, we encourage you to take your nausea medication: Zofran 8 mg every 8 hrs as needed.    If you develop nausea and vomiting that is not controlled by your nausea medication, call the clinic.   BELOW ARE SYMPTOMS THAT SHOULD BE REPORTED IMMEDIATELY:  *FEVER GREATER THAN 100.5 F  *CHILLS WITH OR WITHOUT FEVER  NAUSEA AND VOMITING THAT IS NOT CONTROLLED WITH YOUR NAUSEA MEDICATION  *UNUSUAL SHORTNESS OF BREATH  *UNUSUAL BRUISING OR BLEEDING  TENDERNESS IN MOUTH AND THROAT WITH OR WITHOUT PRESENCE OF ULCERS  *URINARY PROBLEMS  *BOWEL PROBLEMS  UNUSUAL RASH Items with * indicate a potential emergency and should be followed up as soon as possible.  Feel free to call the clinic you have any questions or concerns. The clinic phone number is [phone].

## 2014-03-26 ENCOUNTER — Ambulatory Visit (HOSPITAL_BASED_OUTPATIENT_CLINIC_OR_DEPARTMENT_OTHER): Payer: Medicare Other

## 2014-03-26 ENCOUNTER — Other Ambulatory Visit: Payer: Self-pay | Admitting: *Deleted

## 2014-03-26 ENCOUNTER — Other Ambulatory Visit (HOSPITAL_BASED_OUTPATIENT_CLINIC_OR_DEPARTMENT_OTHER): Payer: Medicare Other

## 2014-03-26 VITALS — BP 131/66 | HR 82 | Temp 97.6°F | Resp 18

## 2014-03-26 DIAGNOSIS — D46C Myelodysplastic syndrome with isolated del(5q) chromosomal abnormality: Secondary | ICD-10-CM

## 2014-03-26 DIAGNOSIS — D649 Anemia, unspecified: Secondary | ICD-10-CM

## 2014-03-26 LAB — CBC WITH DIFFERENTIAL/PLATELET
BASO%: 0.6 % (ref 0.0–2.0)
Basophils Absolute: 0 10*3/uL (ref 0.0–0.1)
EOS%: 6.9 % (ref 0.0–7.0)
Eosinophils Absolute: 0.2 10*3/uL (ref 0.0–0.5)
HCT: 20.8 % — ABNORMAL LOW (ref 34.8–46.6)
HGB: 7.1 g/dL — ABNORMAL LOW (ref 11.6–15.9)
LYMPH%: 6.3 % — AB (ref 14.0–49.7)
MCH: 28.4 pg (ref 25.1–34.0)
MCHC: 33.9 g/dL (ref 31.5–36.0)
MCV: 83.9 fL (ref 79.5–101.0)
MONO#: 0 10*3/uL — ABNORMAL LOW (ref 0.1–0.9)
MONO%: 0.6 % (ref 0.0–14.0)
NEUT#: 2.6 10*3/uL (ref 1.5–6.5)
NEUT%: 85.6 % — AB (ref 38.4–76.8)
PLATELETS: 220 10*3/uL (ref 145–400)
RBC: 2.48 10*6/uL — ABNORMAL LOW (ref 3.70–5.45)
RDW: 12.7 % (ref 11.2–14.5)
WBC: 3.1 10*3/uL — ABNORMAL LOW (ref 3.9–10.3)
lymph#: 0.2 10*3/uL — ABNORMAL LOW (ref 0.9–3.3)

## 2014-03-26 LAB — HOLD TUBE, BLOOD BANK

## 2014-03-26 LAB — PREPARE RBC (CROSSMATCH)

## 2014-03-26 MED ORDER — SODIUM CHLORIDE 0.9 % IJ SOLN
10.0000 mL | INTRAMUSCULAR | Status: AC | PRN
  Administered 2014-03-26: 10 mL
  Filled 2014-03-26: qty 10

## 2014-03-26 MED ORDER — SODIUM CHLORIDE 0.9 % IV SOLN
250.0000 mL | Freq: Once | INTRAVENOUS | Status: AC
  Administered 2014-03-26: 250 mL via INTRAVENOUS

## 2014-03-26 MED ORDER — DIPHENHYDRAMINE HCL 25 MG PO CAPS
25.0000 mg | ORAL_CAPSULE | Freq: Once | ORAL | Status: AC
  Administered 2014-03-26: 25 mg via ORAL

## 2014-03-26 MED ORDER — FUROSEMIDE 10 MG/ML IJ SOLN
20.0000 mg | Freq: Once | INTRAMUSCULAR | Status: AC
  Administered 2014-03-26: 20 mg via INTRAVENOUS

## 2014-03-26 MED ORDER — ACETAMINOPHEN 325 MG PO TABS
ORAL_TABLET | ORAL | Status: AC
  Filled 2014-03-26: qty 2

## 2014-03-26 MED ORDER — DIPHENHYDRAMINE HCL 25 MG PO CAPS
ORAL_CAPSULE | ORAL | Status: AC
  Filled 2014-03-26: qty 2

## 2014-03-26 MED ORDER — ACETAMINOPHEN 325 MG PO TABS
650.0000 mg | ORAL_TABLET | Freq: Once | ORAL | Status: AC
  Administered 2014-03-26: 650 mg via ORAL

## 2014-03-26 MED ORDER — HEPARIN SOD (PORK) LOCK FLUSH 100 UNIT/ML IV SOLN
500.0000 [IU] | Freq: Every day | INTRAVENOUS | Status: AC | PRN
  Administered 2014-03-26: 500 [IU]
  Filled 2014-03-26: qty 5

## 2014-03-26 MED ORDER — HEPARIN SOD (PORK) LOCK FLUSH 100 UNIT/ML IV SOLN
250.0000 [IU] | INTRAVENOUS | Status: DC | PRN
  Filled 2014-03-26: qty 5

## 2014-03-26 MED ORDER — ACETAMINOPHEN 325 MG PO TABS
ORAL_TABLET | ORAL | Status: AC
Start: 2014-03-26 — End: 2014-03-26
  Filled 2014-03-26: qty 1

## 2014-03-26 NOTE — H&P (Signed)
She is here for blood only

## 2014-03-26 NOTE — Patient Instructions (Signed)

## 2014-03-27 LAB — TYPE AND SCREEN
ABO/RH(D): A POS
Antibody Screen: NEGATIVE
Unit division: 0

## 2014-04-02 ENCOUNTER — Telehealth: Payer: Self-pay | Admitting: *Deleted

## 2014-04-02 ENCOUNTER — Other Ambulatory Visit: Payer: Self-pay | Admitting: *Deleted

## 2014-04-02 ENCOUNTER — Other Ambulatory Visit (HOSPITAL_BASED_OUTPATIENT_CLINIC_OR_DEPARTMENT_OTHER): Payer: Medicare Other

## 2014-04-02 ENCOUNTER — Non-Acute Institutional Stay (HOSPITAL_COMMUNITY)
Admission: AD | Admit: 2014-04-02 | Discharge: 2014-04-02 | Disposition: A | Discharge status: Home / Self Care | Payer: Medicare Other | Source: Ambulatory Visit | Attending: Hematology and Oncology | Admitting: Hematology and Oncology

## 2014-04-02 DIAGNOSIS — IMO0002 Reserved for concepts with insufficient information to code with codable children: Secondary | ICD-10-CM | POA: Insufficient documentation

## 2014-04-02 DIAGNOSIS — I10 Essential (primary) hypertension: Secondary | ICD-10-CM | POA: Diagnosis not present

## 2014-04-02 DIAGNOSIS — Z79899 Other long term (current) drug therapy: Secondary | ICD-10-CM | POA: Insufficient documentation

## 2014-04-02 DIAGNOSIS — D649 Anemia, unspecified: Secondary | ICD-10-CM | POA: Insufficient documentation

## 2014-04-02 DIAGNOSIS — D46C Myelodysplastic syndrome with isolated del(5q) chromosomal abnormality: Secondary | ICD-10-CM | POA: Insufficient documentation

## 2014-04-02 DIAGNOSIS — L408 Other psoriasis: Secondary | ICD-10-CM | POA: Insufficient documentation

## 2014-04-02 LAB — CBC WITH DIFFERENTIAL/PLATELET
BASO%: 0.7 % (ref 0.0–2.0)
Basophils Absolute: 0 10*3/uL (ref 0.0–0.1)
EOS%: 9.2 % — ABNORMAL HIGH (ref 0.0–7.0)
Eosinophils Absolute: 0.3 10*3/uL (ref 0.0–0.5)
HEMATOCRIT: 20.8 % — AB (ref 34.8–46.6)
HGB: 7 g/dL — ABNORMAL LOW (ref 11.6–15.9)
LYMPH#: 0.3 10*3/uL — AB (ref 0.9–3.3)
LYMPH%: 9 % — AB (ref 14.0–49.7)
MCH: 28.8 pg (ref 25.1–34.0)
MCHC: 33.6 g/dL (ref 31.5–36.0)
MCV: 85.7 fL (ref 79.5–101.0)
MONO#: 0 10*3/uL — AB (ref 0.1–0.9)
MONO%: 0.5 % (ref 0.0–14.0)
NEUT#: 2.7 10*3/uL (ref 1.5–6.5)
NEUT%: 80.6 % — ABNORMAL HIGH (ref 38.4–76.8)
Platelets: 167 10*3/uL (ref 145–400)
RBC: 2.42 10*6/uL — ABNORMAL LOW (ref 3.70–5.45)
RDW: 14 % (ref 11.2–14.5)
WBC: 3.3 10*3/uL — ABNORMAL LOW (ref 3.9–10.3)

## 2014-04-02 LAB — HOLD TUBE, BLOOD BANK

## 2014-04-02 MED ORDER — SODIUM CHLORIDE 0.9 % IJ SOLN: 3.0000 mL | INTRAMUSCULAR | Status: DC | PRN

## 2014-04-02 MED ORDER — SODIUM CHLORIDE 0.9 % IJ SOLN
10.0000 mL | INTRAMUSCULAR | Status: AC | PRN
  Administered 2014-04-02: 10 mL

## 2014-04-02 MED ORDER — FUROSEMIDE 20 MG PO TABS: 20.0000 mg | ORAL_TABLET | Freq: Once | ORAL | Status: DC

## 2014-04-02 MED ORDER — FUROSEMIDE 10 MG/ML IJ SOLN: 20.0000 mg | Freq: Once | INTRAMUSCULAR | Status: DC

## 2014-04-02 MED ORDER — ACETAMINOPHEN 325 MG PO TABS
650.0000 mg | ORAL_TABLET | Freq: Once | ORAL | Status: AC
  Administered 2014-04-02: 650 mg via ORAL
  Filled 2014-04-02: qty 2

## 2014-04-02 MED ORDER — HEPARIN SOD (PORK) LOCK FLUSH 100 UNIT/ML IV SOLN: 250.0000 [IU] | INTRAVENOUS | Status: DC | PRN

## 2014-04-02 MED ORDER — HEPARIN SOD (PORK) LOCK FLUSH 100 UNIT/ML IV SOLN
500.0000 [IU] | Freq: Every day | INTRAVENOUS | Status: AC | PRN
  Administered 2014-04-02: 500 [IU]
  Filled 2014-04-02: qty 5

## 2014-04-02 MED ORDER — DIPHENHYDRAMINE HCL 25 MG PO CAPS
25.0000 mg | ORAL_CAPSULE | Freq: Once | ORAL | Status: AC
  Administered 2014-04-02: 25 mg via ORAL
  Filled 2014-04-02: qty 1

## 2014-04-02 MED ORDER — SODIUM CHLORIDE 0.9 % IJ SOLN
10.0000 mL | INTRAMUSCULAR | Status: DC | PRN
  Filled 2014-04-02: qty 10

## 2014-04-02 MED ORDER — SODIUM CHLORIDE 0.9 % IV SOLN
250.0000 mL | Freq: Once | INTRAVENOUS | Status: AC
  Administered 2014-04-02: 250 mL via INTRAVENOUS

## 2014-04-02 MED ORDER — FUROSEMIDE 10 MG/ML IJ SOLN
20.0000 mg | Freq: Once | INTRAMUSCULAR | Status: AC
  Administered 2014-04-02: 20 mg via INTRAVENOUS
  Filled 2014-04-02: qty 2

## 2014-04-02 NOTE — Procedures (Signed)
[facility]  Procedure Note  Rebekah Cross AVW:[id number] DOB: [birth year] DOA: 04/02/2014   PCP: Mayra Neer, MD   Associated Diagnosis: Myelodysplastic syndrome  Procedure Note: Transfuse 2 units PRBCs   Condition During Procedure: Pt tolerated well   Condition at Discharge: Alert and oriented; ambulatory; no complications noted   Nigel Sloop, [facility]

## 2014-04-02 NOTE — Telephone Encounter (Signed)
Pt informed of order for 2 units of PRBCs today at [facility] at 11 am.  She verbalized understanding.

## 2014-04-02 NOTE — H&P (Signed)
Patient is here for blood transfusion only 

## 2014-04-02 NOTE — Progress Notes (Signed)
Notified Dr. Calton Dach office about pt diastolic blood pressure of 36; orders received to continue with blood transfusion; will continue to monitor

## 2014-04-03 LAB — TYPE AND SCREEN
ABO/RH(D): A POS
ANTIBODY SCREEN: NEGATIVE
UNIT DIVISION: 0
Unit division: 0

## 2014-04-09 ENCOUNTER — Other Ambulatory Visit (HOSPITAL_BASED_OUTPATIENT_CLINIC_OR_DEPARTMENT_OTHER): Payer: Medicare Other

## 2014-04-09 ENCOUNTER — Telehealth: Payer: Self-pay | Admitting: *Deleted

## 2014-04-09 DIAGNOSIS — D46C Myelodysplastic syndrome with isolated del(5q) chromosomal abnormality: Secondary | ICD-10-CM

## 2014-04-09 LAB — CBC WITH DIFFERENTIAL/PLATELET
BASO%: 6.4 % — ABNORMAL HIGH (ref 0.0–2.0)
BASOS ABS: 0.1 10*3/uL (ref 0.0–0.1)
EOS ABS: 0.4 10*3/uL (ref 0.0–0.5)
EOS%: 33 % — ABNORMAL HIGH (ref 0.0–7.0)
HCT: 24.1 % — ABNORMAL LOW (ref 34.8–46.6)
HEMOGLOBIN: 8.2 g/dL — AB (ref 11.6–15.9)
LYMPH%: 22.9 % (ref 14.0–49.7)
MCH: 28.7 pg (ref 25.1–34.0)
MCHC: 34 g/dL (ref 31.5–36.0)
MCV: 84.3 fL (ref 79.5–101.0)
MONO#: 0 10*3/uL — AB (ref 0.1–0.9)
MONO%: 1.8 % (ref 0.0–14.0)
NEUT%: 35.9 % — ABNORMAL LOW (ref 38.4–76.8)
NEUTROS ABS: 0.4 10*3/uL — AB (ref 1.5–6.5)
Platelets: 144 10*3/uL — ABNORMAL LOW (ref 145–400)
RBC: 2.86 10*6/uL — AB (ref 3.70–5.45)
RDW: 14.1 % (ref 11.2–14.5)
WBC: 1.1 10*3/uL — AB (ref 3.9–10.3)
lymph#: 0.3 10*3/uL — ABNORMAL LOW (ref 0.9–3.3)

## 2014-04-09 LAB — TECHNOLOGIST REVIEW

## 2014-04-09 LAB — HOLD TUBE, BLOOD BANK

## 2014-04-09 NOTE — Telephone Encounter (Signed)
Informed pt and daughter in lobby no need for transfusion today.  Hgb 8.2.  Pt/dau verbalized understanding. Pt states feels fatigued but no worse than usual.  She denies any sob/ chest pains or dizziness.  Instructed to keep appt as scheduled in one week.  Call sooner if any problems, changes in condition.  They verbalized understanding.

## 2014-04-11 ENCOUNTER — Other Ambulatory Visit: Payer: Self-pay | Admitting: Hematology and Oncology

## 2014-04-16 ENCOUNTER — Ambulatory Visit (HOSPITAL_COMMUNITY)
Admission: RE | Admit: 2014-04-16 | Discharge: 2014-04-16 | Disposition: A | Discharge status: Home / Self Care | Payer: Medicare Other | Source: Ambulatory Visit | Attending: Hematology and Oncology | Admitting: Hematology and Oncology

## 2014-04-16 ENCOUNTER — Encounter: Payer: Self-pay | Admitting: Hematology and Oncology

## 2014-04-16 ENCOUNTER — Encounter: Payer: Self-pay | Admitting: *Deleted

## 2014-04-16 ENCOUNTER — Telehealth: Payer: Self-pay | Admitting: Hematology and Oncology

## 2014-04-16 ENCOUNTER — Ambulatory Visit (HOSPITAL_BASED_OUTPATIENT_CLINIC_OR_DEPARTMENT_OTHER): Payer: Medicare Other | Admitting: Hematology and Oncology

## 2014-04-16 ENCOUNTER — Ambulatory Visit (HOSPITAL_BASED_OUTPATIENT_CLINIC_OR_DEPARTMENT_OTHER): Payer: Medicare Other

## 2014-04-16 ENCOUNTER — Other Ambulatory Visit: Payer: Self-pay | Admitting: Hematology and Oncology

## 2014-04-16 ENCOUNTER — Other Ambulatory Visit (HOSPITAL_BASED_OUTPATIENT_CLINIC_OR_DEPARTMENT_OTHER): Payer: Medicare Other

## 2014-04-16 VITALS — BP 152/36 | HR 91 | Temp 97.8°F | Resp 18 | Ht 60.0 in | Wt 131.8 lb

## 2014-04-16 VITALS — BP 131/43 | HR 77 | Temp 98.5°F | Resp 16

## 2014-04-16 DIAGNOSIS — D630 Anemia in neoplastic disease: Secondary | ICD-10-CM | POA: Diagnosis not present

## 2014-04-16 DIAGNOSIS — D701 Agranulocytosis secondary to cancer chemotherapy: Secondary | ICD-10-CM

## 2014-04-16 DIAGNOSIS — D469 Myelodysplastic syndrome, unspecified: Secondary | ICD-10-CM

## 2014-04-16 DIAGNOSIS — D46C Myelodysplastic syndrome with isolated del(5q) chromosomal abnormality: Secondary | ICD-10-CM

## 2014-04-16 DIAGNOSIS — E119 Type 2 diabetes mellitus without complications: Secondary | ICD-10-CM

## 2014-04-16 DIAGNOSIS — L409 Psoriasis, unspecified: Secondary | ICD-10-CM

## 2014-04-16 DIAGNOSIS — R112 Nausea with vomiting, unspecified: Secondary | ICD-10-CM

## 2014-04-16 DIAGNOSIS — L408 Other psoriasis: Secondary | ICD-10-CM

## 2014-04-16 DIAGNOSIS — T451X5A Adverse effect of antineoplastic and immunosuppressive drugs, initial encounter: Secondary | ICD-10-CM

## 2014-04-16 DIAGNOSIS — D72819 Decreased white blood cell count, unspecified: Secondary | ICD-10-CM

## 2014-04-16 HISTORY — DX: Type 2 diabetes mellitus without complications: E11.9

## 2014-04-16 LAB — CBC WITH DIFFERENTIAL/PLATELET
BASO%: 0.1 % (ref 0.0–2.0)
Basophils Absolute: 0 10*3/uL (ref 0.0–0.1)
EOS%: 14 % — AB (ref 0.0–7.0)
Eosinophils Absolute: 0.5 10*3/uL (ref 0.0–0.5)
HEMATOCRIT: 19.8 % — AB (ref 34.8–46.6)
HEMOGLOBIN: 6.6 g/dL — AB (ref 11.6–15.9)
LYMPH#: 0.3 10*3/uL — AB (ref 0.9–3.3)
LYMPH%: 7.5 % — ABNORMAL LOW (ref 14.0–49.7)
MCH: 28.4 pg (ref 25.1–34.0)
MCHC: 33.2 g/dL (ref 31.5–36.0)
MCV: 85.5 fL (ref 79.5–101.0)
MONO#: 0.1 10*3/uL (ref 0.1–0.9)
MONO%: 1.5 % (ref 0.0–14.0)
NEUT%: 76.9 % — AB (ref 38.4–76.8)
NEUTROS ABS: 3 10*3/uL (ref 1.5–6.5)
Platelets: 218 10*3/uL (ref 145–400)
RBC: 2.32 10*6/uL — ABNORMAL LOW (ref 3.70–5.45)
RDW: 14.3 % (ref 11.2–14.5)
WBC: 3.8 10*3/uL — AB (ref 3.9–10.3)

## 2014-04-16 LAB — COMPREHENSIVE METABOLIC PANEL (CC13)
ALBUMIN: 2.9 g/dL — AB (ref 3.5–5.0)
ALT: 24 U/L (ref 0–55)
AST: 9 U/L (ref 5–34)
Alkaline Phosphatase: 56 U/L (ref 40–150)
Anion Gap: 12 mEq/L — ABNORMAL HIGH (ref 3–11)
BUN: 31.9 mg/dL — ABNORMAL HIGH (ref 7.0–26.0)
CALCIUM: 9.7 mg/dL (ref 8.4–10.4)
CHLORIDE: 100 meq/L (ref 98–109)
CO2: 25 meq/L (ref 22–29)
Creatinine: 1.1 mg/dL (ref 0.6–1.1)
GLUCOSE: 530 mg/dL — AB (ref 70–140)
POTASSIUM: 4 meq/L (ref 3.5–5.1)
SODIUM: 137 meq/L (ref 136–145)
TOTAL PROTEIN: 6.5 g/dL (ref 6.4–8.3)
Total Bilirubin: 0.75 mg/dL (ref 0.20–1.20)

## 2014-04-16 LAB — PREPARE RBC (CROSSMATCH)

## 2014-04-16 LAB — WHOLE BLOOD GLUCOSE
GLUCOSE: 531 mg/dL — AB (ref 70–100)
HRS PC: 0.25 h

## 2014-04-16 LAB — HOLD TUBE, BLOOD BANK

## 2014-04-16 MED ORDER — ACETAMINOPHEN 325 MG PO TABS
650.0000 mg | ORAL_TABLET | Freq: Once | ORAL | Status: AC
  Administered 2014-04-16: 650 mg via ORAL

## 2014-04-16 MED ORDER — METFORMIN HCL 500 MG PO TABS: 500.0000 mg | ORAL_TABLET | Freq: Two times a day (BID) | ORAL | Status: DC

## 2014-04-16 MED ORDER — ACETAMINOPHEN 325 MG PO TABS
ORAL_TABLET | ORAL | Status: AC
  Filled 2014-04-16: qty 2

## 2014-04-16 MED ORDER — HEPARIN SOD (PORK) LOCK FLUSH 100 UNIT/ML IV SOLN
500.0000 [IU] | Freq: Every day | INTRAVENOUS | Status: AC | PRN
  Administered 2014-04-16: 500 [IU]
  Filled 2014-04-16: qty 5

## 2014-04-16 MED ORDER — DIPHENHYDRAMINE HCL 25 MG PO CAPS
25.0000 mg | ORAL_CAPSULE | Freq: Once | ORAL | Status: AC
  Administered 2014-04-16: 25 mg via ORAL

## 2014-04-16 MED ORDER — FUROSEMIDE 10 MG/ML IJ SOLN
20.0000 mg | Freq: Once | INTRAMUSCULAR | Status: AC
  Administered 2014-04-16: 20 mg via INTRAVENOUS

## 2014-04-16 MED ORDER — SODIUM CHLORIDE 0.9 % IV SOLN
250.0000 mL | Freq: Once | INTRAVENOUS | Status: AC
  Administered 2014-04-16: 250 mL via INTRAVENOUS

## 2014-04-16 MED ORDER — DIPHENHYDRAMINE HCL 25 MG PO CAPS
ORAL_CAPSULE | ORAL | Status: AC
  Filled 2014-04-16: qty 1

## 2014-04-16 MED ORDER — SODIUM CHLORIDE 0.9 % IJ SOLN
10.0000 mL | INTRAMUSCULAR | Status: AC | PRN
  Administered 2014-04-16: 10 mL
  Filled 2014-04-16: qty 10

## 2014-04-16 NOTE — Assessment & Plan Note (Signed)
We discussed some of the risks, benefits, and alternatives of blood transfusions. The patient is symptomatic from anemia and the hemoglobin level is critically low.  Some of the side-effects to be expected including risks of transfusion reactions, chills, infection, syndrome of volume overload and risk of hospitalization from various reasons and the patient is willing to proceed and went ahead to sign consent today.  

## 2014-04-16 NOTE — Assessment & Plan Note (Signed)
I suspect this could be related to severe hyperglycemia. Hopefully, the platelet transfusion and anti-emetics will help until we can get her blood sugar well-controlled.

## 2014-04-16 NOTE — Assessment & Plan Note (Signed)
This is likely due to recent treatment. The patient denies recent history of fevers, cough, chills, diarrhea or dysuria. She is asymptomatic from the leukopenia. I will observe for now.  I will continue the chemotherapy at current dose without dosage adjustment.  If the leukopenia gets progressive worse in the future, I might have to delay her treatment or adjust the chemotherapy dose.   

## 2014-04-16 NOTE — Assessment & Plan Note (Signed)
The patient did not tolerate tapered dose to 10 mg in the past. I recommend she continues prednisone at 15 mg daily.

## 2014-04-16 NOTE — Patient Instructions (Addendum)

## 2014-04-16 NOTE — Telephone Encounter (Signed)
gv dtr appt schedule for july

## 2014-04-16 NOTE — Progress Notes (Signed)
[place] OFFICE PROGRESS NOTE  Patient Care Team: Mayra Neer, MD as PCP - General (Family Medicine) Heath Lark, MD as Consulting Physician (Hematology and Oncology)  SUMMARY OF ONCOLOGIC HISTORY: This is a very pleasant 80 year old lady who become transfusion dependent since 2014. She denies any bleeding such as epistaxis, hematuria, or hematochezia. 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 Erythropoietin stimulating agents were stopped when her erythropoietin level came back over 500 In October 2014, she was started on Revlimid however develop severe allergic reaction to Revlimid. That was discontinued. On 08/07/2013, we started her on Vidaza for 2 cycles. On 10/04/2013, repeat a bone marrow aspirate and biopsy showed persistent disease. Additional testing revealed that her disease is JAK 2 positive On 11/06/2013, Vidaza is resumed after her second opinion from [facility] On 02/08/2014, repeat bone marrow aspirate and biopsy showed no evidence of increased blasts On 04/16/2014, Vidaza is discontinued due to increased transfusion requirement and progressive weakness INTERVAL HISTORY: Please see below for problem oriented charting. Over the weekend, she had uncontrolled nausea and vomiting. She felt weak and dizzy. She also started to have regular headaches. Her psoriasis is better controlled with 15 mg of prednisone. REVIEW OF SYSTEMS:   Constitutional: Denies fevers, chills or abnormal weight loss Eyes: Denies blurriness of vision Ears, nose, mouth, throat, and face: Denies mucositis or sore throat Respiratory: Denies cough, dyspnea or wheezes Cardiovascular: Denies palpitation, chest discomfort or lower extremity swelling Gastrointestinal:  Denies nausea, heartburn or change in bowel habits Skin: Denies abnormal skin rashes Lymphatics: Denies new lymphadenopathy  or easy bruising Neurological:Denies numbness, tingling or new weaknesses Behavioral/Psych: Mood is stable, no new changes  All other systems were reviewed with the patient and are negative.  I have reviewed the past medical history, past surgical history, social history and family history with the patient and they are unchanged from previous note.  ALLERGIES:  is allergic to novocain; oxycodone; codeine; methimazole; and revlimid.  MEDICATIONS:  Current Outpatient Prescriptions  Medication Sig Dispense Refill  . acetaminophen (TYLENOL) 500 MG tablet Take 1,000 mg by mouth every 6 (six) hours as needed (Pain).       Marland Kitchen aspirin EC 81 MG tablet Take 81 mg by mouth every morning.      . cholecalciferol (VITAMIN D) 1000 UNITS tablet Take 1,000 Units by mouth daily.      . clobetasol cream (TEMOVATE) 0.05 % Apply 1.61 application topically 2 (two) times daily.      . hydrOXYzine (ATARAX/VISTARIL) 25 MG tablet TAKE ONE TABLET BY MOUTH EVERY 4 HOURS AS NEEDED FOR ITCHING  60 tablet  4  . lidocaine-prilocaine (EMLA) cream Apply 1 application topically as needed.  30 g  0  . LORazepam (ATIVAN) 0.5 MG tablet Take 0.5 mg by mouth at bedtime.       . metoprolol succinate (TOPROL-XL) 100 MG 24 hr tablet Take 100 mg by mouth every morning.       . Multiple Vitamin (MULTIVITAMIN) tablet Take 1 tablet by mouth every morning.       . nisoldipine (SULAR) 25.5 MG 24 hr tablet Take 25.5 mg by mouth every morning.       . olmesartan-hydrochlorothiazide (BENICAR HCT) 40-25 MG per tablet Take 1 tablet by mouth every morning.       . ondansetron (ZOFRAN) 8 MG tablet Take 8 mg by mouth every 8 (eight) hours  as needed for nausea or vomiting.       . potassium chloride SA (K-DUR,KLOR-CON) 20 MEQ tablet Take 20 mEq by mouth every morning.       . pravastatin (PRAVACHOL) 40 MG tablet Take 40 mg by mouth at bedtime.       . predniSONE (DELTASONE) 10 MG tablet Take 1.5 tablets (15 mg total) by mouth daily with breakfast.   60 tablet  1  . prochlorperazine (COMPAZINE) 10 MG tablet TAKE 1 TABLET BY MOUTH EVERY 6 HOURS AS NEEDED FOR NAUSEA/VOMITING  90 tablet  9  . metFORMIN (GLUCOPHAGE) 500 MG tablet Take 1 tablet (500 mg total) by mouth 2 (two) times daily with a meal.  60 tablet  1   No current facility-administered medications for this visit.   Facility-Administered Medications Ordered in Other Visits  Medication Dose Route Frequency Provider Last Rate Last Dose  . sodium chloride 0.9 % injection 3 mL  3 mL Intracatheter PRN Heath Lark, MD        PHYSICAL EXAMINATION: ECOG PERFORMANCE STATUS: 2 - Symptomatic, <50% confined to bed  Filed Vitals:   04/16/14 0909  BP: 152/36  Pulse: 91  Temp: 97.8 F (36.6 C)  Resp: 18   Filed Weights   04/16/14 0909  Weight: 131 lb 12.8 oz (59.784 kg)    GENERAL:alert, no distress and comfortable. She looks pale SKIN: She has persistent skin rash from psoriasis but much improved EYES: normal, Conjunctiva are pink and non-injected, sclera clear OROPHARYNX:no exudate, no erythema and lips, buccal mucosa, and tongue normal  NECK: supple, thyroid normal size, non-tender, without nodularity LYMPH:  no palpable lymphadenopathy in the cervical, axillary or inguinal LUNGS: clear to auscultation and percussion with normal breathing effort HEART: regular rate & rhythm and no murmurs and no lower extremity edema ABDOMEN:abdomen soft, non-tender and normal bowel sounds Musculoskeletal:no cyanosis of digits and no clubbing  NEURO: alert & oriented x 3 with fluent speech, no focal motor/sensory deficits  LABORATORY DATA:  I have reviewed the data as listed    Component Value Date/Time   NA 137 04/16/2014 0852   NA 136 08/27/2013 1212   K 4.0 04/16/2014 0852   K 3.4* 08/27/2013 1212   CL 102 08/27/2013 1212   CO2 25 04/16/2014 0852   CO2 23 08/27/2013 1212   GLUCOSE 530* 04/16/2014 0852   GLUCOSE 118* 08/27/2013 1212   BUN 31.9* 04/16/2014 0852   BUN 23 08/27/2013 1212    CREATININE 1.1 04/16/2014 0852   CREATININE 0.78 08/27/2013 1212   CALCIUM 9.7 04/16/2014 0852   CALCIUM 9.2 08/27/2013 1212   PROT 6.5 04/16/2014 0852   PROT 6.6 08/27/2013 1212   ALBUMIN 2.9* 04/16/2014 0852   ALBUMIN 3.8 08/27/2013 1212   AST 9 04/16/2014 0852   AST 17 08/27/2013 1212   ALT 24 04/16/2014 0852   ALT 25 08/27/2013 1212   ALKPHOS 56 04/16/2014 0852   ALKPHOS 56 08/27/2013 1212   BILITOT 0.75 04/16/2014 0852   BILITOT 1.9* 08/27/2013 1212   GFRNONAA 74* 08/27/2013 1212   GFRAA 85* 08/27/2013 1212    No results found for this basename: SPEP,  UPEP,   kappa and lambda light chains    Lab Results  Component Value Date   WBC 3.8* 04/16/2014   NEUTROABS 3.0 04/16/2014   HGB 6.6* 04/16/2014   HCT 19.8* 04/16/2014   MCV 85.5 04/16/2014   PLT 218 04/16/2014      Chemistry  Component Value Date/Time   NA 137 04/16/2014 0852   NA 136 08/27/2013 1212   K 4.0 04/16/2014 0852   K 3.4* 08/27/2013 1212   CL 102 08/27/2013 1212   CO2 25 04/16/2014 0852   CO2 23 08/27/2013 1212   BUN 31.9* 04/16/2014 0852   BUN 23 08/27/2013 1212   CREATININE 1.1 04/16/2014 0852   CREATININE 0.78 08/27/2013 1212   GLU 531* 04/16/2014 1548      Component Value Date/Time   CALCIUM 9.7 04/16/2014 0852   CALCIUM 9.2 08/27/2013 1212   ALKPHOS 56 04/16/2014 0852   ALKPHOS 56 08/27/2013 1212   AST 9 04/16/2014 0852   AST 17 08/27/2013 1212   ALT 24 04/16/2014 0852   ALT 25 08/27/2013 1212   BILITOT 0.75 04/16/2014 0852   BILITOT 1.9* 08/27/2013 1212      ASSESSMENT & PLAN:  MDS (myelodysplastic syndrome) with 5q deletion Recent bone marrow aspirate and biopsy confirmed stable disease. However, she tolerated treatment poorly. I recommend holding off treatment for now and provide transfusion support. I will consult with [facility] again to see if other treatment options are available for her. In the meantime, I recommend she continues taking prednisone 50 mg daily.   Anemia in  neoplastic disease We discussed some of the risks, benefits, and alternatives of blood transfusions. The patient is symptomatic from anemia and the hemoglobin level is critically low.  Some of the side-effects to be expected including risks of transfusion reactions, chills, infection, syndrome of volume overload and risk of hospitalization from various reasons and the patient is willing to proceed and went ahead to sign consent today.   Leukopenia due to antineoplastic chemotherapy This is likely due to recent treatment. The patient denies recent history of fevers, cough, chills, diarrhea or dysuria. She is asymptomatic from the leukopenia. I will observe for now.  I will continue the chemotherapy at current dose without dosage adjustment.  If the leukopenia gets progressive worse in the future, I might have to delay her treatment or adjust the chemotherapy dose.      Type II or unspecified type diabetes mellitus without mention of complication, not stated as uncontrolled This could contribute to a weakness. I will start her on metformin 500 mg daily by mouth and I recommended she contacts her primary care provider for diabetes management. I suspect this is related to long-term use of prednisone.  Psoriasis The patient did not tolerate tapered dose to 10 mg in the past. I recommend she continues prednisone at 15 mg daily.    Nausea and vomiting I suspect this could be related to severe hyperglycemia. Hopefully, the platelet transfusion and anti-emetics will help until we can get her blood sugar well-controlled.    No orders of the defined types were placed in this encounter.   All questions were answered. The patient knows to call the clinic with any problems, questions or concerns. No barriers to learning was detected. I spent 30 minutes counseling the patient face to face. The total time spent in the appointment was 40 minutes and more than 50% was on counseling and review of test  results     [facility], Loyd Marhefka, MD 04/16/2014 8:46 PM

## 2014-04-16 NOTE — Progress Notes (Signed)
Blood sugar greater than 500 today.  Dr. Alvy Bimler prescribed Metformin and instructs for pt to start by taking once daily.  Make appt w/ PCP to manage elevated blood sugars.  Instructions given to pt and daughter. They verbalized understanding.  CMET results routed to Dr. Brigitte Pulse.

## 2014-04-16 NOTE — Assessment & Plan Note (Signed)
Recent bone marrow aspirate and biopsy confirmed stable disease. However, she tolerated treatment poorly. I recommend holding off treatment for now and provide transfusion support. I will consult with [facility] again to see if other treatment options are available for her. In the meantime, I recommend she continues taking prednisone 50 mg daily.

## 2014-04-16 NOTE — Assessment & Plan Note (Signed)
This could contribute to a weakness. I will start her on metformin 500 mg daily by mouth and I recommended she contacts her primary care provider for diabetes management. I suspect this is related to long-term use of prednisone.

## 2014-04-17 ENCOUNTER — Ambulatory Visit: Payer: Medicare Other

## 2014-04-17 LAB — TYPE AND SCREEN
ABO/RH(D): A POS
Antibody Screen: NEGATIVE
Unit division: 0
Unit division: 0

## 2014-04-18 ENCOUNTER — Ambulatory Visit: Payer: Medicare Other

## 2014-04-19 ENCOUNTER — Encounter (HOSPITAL_COMMUNITY): Payer: Self-pay | Admitting: Emergency Medicine

## 2014-04-19 ENCOUNTER — Ambulatory Visit: Payer: Medicare Other

## 2014-04-19 ENCOUNTER — Emergency Department (HOSPITAL_COMMUNITY)
Admission: EM | Admit: 2014-04-19 | Discharge: 2014-04-20 | Disposition: A | Discharge status: Home / Self Care | Payer: Medicare Other | Attending: Emergency Medicine | Admitting: Emergency Medicine

## 2014-04-19 ENCOUNTER — Emergency Department (HOSPITAL_COMMUNITY): Payer: Medicare Other

## 2014-04-19 DIAGNOSIS — Z872 Personal history of diseases of the skin and subcutaneous tissue: Secondary | ICD-10-CM | POA: Insufficient documentation

## 2014-04-19 DIAGNOSIS — E119 Type 2 diabetes mellitus without complications: Secondary | ICD-10-CM | POA: Insufficient documentation

## 2014-04-19 DIAGNOSIS — R519 Headache, unspecified: Secondary | ICD-10-CM

## 2014-04-19 DIAGNOSIS — Z794 Long term (current) use of insulin: Secondary | ICD-10-CM | POA: Diagnosis not present

## 2014-04-19 DIAGNOSIS — R739 Hyperglycemia, unspecified: Secondary | ICD-10-CM

## 2014-04-19 DIAGNOSIS — IMO0002 Reserved for concepts with insufficient information to code with codable children: Secondary | ICD-10-CM | POA: Diagnosis not present

## 2014-04-19 DIAGNOSIS — I10 Essential (primary) hypertension: Secondary | ICD-10-CM | POA: Insufficient documentation

## 2014-04-19 DIAGNOSIS — T380X5A Adverse effect of glucocorticoids and synthetic analogues, initial encounter: Secondary | ICD-10-CM | POA: Insufficient documentation

## 2014-04-19 DIAGNOSIS — E7800 Pure hypercholesterolemia, unspecified: Secondary | ICD-10-CM | POA: Insufficient documentation

## 2014-04-19 DIAGNOSIS — Z862 Personal history of diseases of the blood and blood-forming organs and certain disorders involving the immune mechanism: Secondary | ICD-10-CM | POA: Diagnosis not present

## 2014-04-19 DIAGNOSIS — R51 Headache: Secondary | ICD-10-CM | POA: Insufficient documentation

## 2014-04-19 DIAGNOSIS — Z79899 Other long term (current) drug therapy: Secondary | ICD-10-CM | POA: Diagnosis not present

## 2014-04-19 LAB — CBC WITH DIFFERENTIAL/PLATELET
BASOS ABS: 0 10*3/uL (ref 0.0–0.1)
Basophils Relative: 1 % (ref 0–1)
EOS PCT: 6 % — AB (ref 0–5)
Eosinophils Absolute: 0.3 10*3/uL (ref 0.0–0.7)
HCT: 27 % — ABNORMAL LOW (ref 36.0–46.0)
Hemoglobin: 9.2 g/dL — ABNORMAL LOW (ref 12.0–15.0)
LYMPHS PCT: 7 % — AB (ref 12–46)
Lymphs Abs: 0.3 10*3/uL — ABNORMAL LOW (ref 0.7–4.0)
MCH: 27.9 pg (ref 26.0–34.0)
MCHC: 34.1 g/dL (ref 30.0–36.0)
MCV: 81.8 fL (ref 78.0–100.0)
MONO ABS: 0.2 10*3/uL (ref 0.1–1.0)
Monocytes Relative: 4 % (ref 3–12)
NEUTROS PCT: 82 % — AB (ref 43–77)
Neutro Abs: 3.6 10*3/uL (ref 1.7–7.7)
PLATELETS: 198 10*3/uL (ref 150–400)
RBC: 3.3 MIL/uL — AB (ref 3.87–5.11)
RDW: 14.8 % (ref 11.5–15.5)
WBC: 4.4 10*3/uL (ref 4.0–10.5)

## 2014-04-19 LAB — COMPREHENSIVE METABOLIC PANEL
ALT: 20 U/L (ref 0–35)
ANION GAP: 17 — AB (ref 5–15)
AST: 12 U/L (ref 0–37)
Albumin: 3 g/dL — ABNORMAL LOW (ref 3.5–5.2)
Alkaline Phosphatase: 60 U/L (ref 39–117)
BILIRUBIN TOTAL: 0.6 mg/dL (ref 0.3–1.2)
BUN: 42 mg/dL — AB (ref 6–23)
CHLORIDE: 92 meq/L — AB (ref 96–112)
CO2: 25 meq/L (ref 19–32)
CREATININE: 0.8 mg/dL (ref 0.50–1.10)
Calcium: 9.8 mg/dL (ref 8.4–10.5)
GFR calc Af Amer: 75 mL/min — ABNORMAL LOW (ref >90)
GFR, EST NON AFRICAN AMERICAN: 65 mL/min — AB (ref >90)
Glucose, Bld: 398 mg/dL — ABNORMAL HIGH (ref 70–99)
Potassium: 4.7 mEq/L (ref 3.7–5.3)
Sodium: 134 mEq/L — ABNORMAL LOW (ref 137–147)
Total Protein: 6.6 g/dL (ref 6.0–8.3)

## 2014-04-19 LAB — CBG MONITORING, ED: GLUCOSE-CAPILLARY: 403 mg/dL — AB (ref 70–99)

## 2014-04-19 MED ORDER — SODIUM CHLORIDE 0.9 % IV BOLUS (SEPSIS)
500.0000 mL | Freq: Once | INTRAVENOUS | Status: AC
  Administered 2014-04-19: 500 mL via INTRAVENOUS

## 2014-04-19 MED ORDER — INSULIN ASPART 100 UNIT/ML ~~LOC~~ SOLN
8.0000 [IU] | Freq: Once | SUBCUTANEOUS | Status: AC
  Administered 2014-04-19: 8 [IU] via INTRAVENOUS
  Filled 2014-04-19: qty 1

## 2014-04-19 NOTE — ED Provider Notes (Signed)
CSN: [id number]     Arrival date & time 04/19/14  1927 History   First MD Initiated Contact with Patient 04/19/14 2050     Chief Complaint  Patient presents with  . Hyperglycemia     (Consider location/radiation/quality/duration/timing/severity/associated sxs/prior Treatment) HPI A [age]-year-old female who comes in today complaining of hyperglycemia. She has a history of a myelodysplastic syndrome for which she has been on steroids for the past several months which has caused her blood sugars to be elevated. Today her blood her was registering as too high to read. They spoke with her primary care physician who told them to come into the emergency department for further management. She's had some headache over the past several days. She has not had any head trauma, fever, or neck pain. She has not had a lateralized deficits. She denies nausea vomiting, chest pain, abdominal pain, urinary tract infection symptoms, urinary frequency, or other injury. She has had some generalized weakness. Past Medical History  Diagnosis Date  . Anemia, unspecified   . Hypertension   . Thyroid disease   . Hypercholesteremia   . MDS (myelodysplastic syndrome) with 5q deletion 07/11/2013  . Psoriasis 03/19/2014  . Type II or unspecified type diabetes mellitus without mention of complication, not stated as uncontrolled 04/16/2014   Past Surgical History  Procedure Laterality Date  . Abdominal hysterectomy    . Back surgery    . Arthroscopy right knee    . Arthroscopy left knee     Family History  Problem Relation Age of Onset  . Colon cancer Daughter   . Heart disease Maternal Grandmother   . Heart disease Paternal Grandfather    History  Substance Use Topics  . Smoking status: Never Smoker   . Smokeless tobacco: Never Used  . Alcohol Use: No   OB History   Grav Para Term Preterm Abortions TAB SAB Ect Mult Living                 Review of Systems  All other systems reviewed and are  negative.     Allergies  Novocain; Oxycodone; Codeine; Methimazole; and Revlimid  Home Medications   Prior to Admission medications   Medication Sig Start Date End Date Taking? Authorizing Provider  acetaminophen (TYLENOL) 500 MG tablet Take 1,000 mg by mouth every 6 (six) hours as needed (Pain).    Yes Historical Provider, MD  acitretin (SORIATANE) 25 MG capsule Take 25 mg by mouth See admin instructions. Take two times a week   Yes Historical Provider, MD  desoximetasone (TOPICORT) 0.25 % cream Apply 1 application topically 2 (two) times daily as needed. For rash   Yes Historical Provider, MD  insulin aspart (NOVOLOG) 100 UNIT/ML injection Inject 15 Units into the skin 3 (three) times daily before meals.   Yes Historical Provider, MD  insulin detemir (LEVEMIR) 100 UNIT/ML injection Inject 5 Units into the skin daily.   Yes Historical Provider, MD  LORazepam (ATIVAN) 0.5 MG tablet Take 0.5 mg by mouth at bedtime.    Yes Historical Provider, MD  metFORMIN (GLUCOPHAGE) 500 MG tablet Take 1 tablet (500 mg total) by mouth 2 (two) times daily with a meal. 04/16/14  Yes Heath Lark, MD  metoprolol succinate (TOPROL-XL) 100 MG 24 hr tablet Take 50 mg by mouth every morning.    Yes Historical Provider, MD  mometasone (ELOCON) 0.1 % cream Apply 1 application topically daily as needed. Rash   Yes Historical Provider, MD  mometasone (NASONEX) 50 MCG/ACT  nasal spray Place 2 sprays into the nose 2 (two) times daily.   Yes Historical Provider, MD  Multiple Vitamin (MULTIVITAMIN) tablet Take 1 tablet by mouth every morning.    Yes Historical Provider, MD  olmesartan-hydrochlorothiazide (BENICAR HCT) 40-25 MG per tablet Take 1 tablet by mouth daily.   Yes Historical Provider, MD  potassium chloride SA (K-DUR,KLOR-CON) 20 MEQ tablet Take 20 mEq by mouth every morning.    Yes Historical Provider, MD  predniSONE (DELTASONE) 5 MG tablet Take 5 mg by mouth daily with breakfast.   Yes Historical Provider, MD    BP 140/46  Pulse 79  Temp(Src) 98.1 F (36.7 C) (Oral)  Resp 23  SpO2 93% Physical Exam  Nursing note and vitals reviewed. Constitutional: She is oriented to person, place, and time. She appears well-developed and well-nourished.  HENT:  Head: Normocephalic and atraumatic.  Right Ear: External ear normal.  Left Ear: External ear normal.  Nose: Nose normal.  Mouth/Throat: Oropharynx is clear and moist.  Eyes: Conjunctivae and EOM are normal. Pupils are equal, round, and reactive to light.  Neck: Normal range of motion. Neck supple.  Cardiovascular: Normal rate, regular rhythm, normal heart sounds and intact distal pulses.   Pulmonary/Chest: Effort normal and breath sounds normal.  Abdominal: Soft. Bowel sounds are normal.  Musculoskeletal: Normal range of motion.  Neurological: She is alert and oriented to person, place, and time. She has normal reflexes. She displays normal reflexes. No cranial nerve deficit. She exhibits normal muscle tone. Coordination normal.  Skin: Skin is warm and dry.  Psychiatric: She has a normal mood and affect. Her behavior is normal. Judgment and thought content normal.    ED Course  Procedures (including critical care time) Labs Review Labs Reviewed  COMPREHENSIVE METABOLIC PANEL - Abnormal; Notable for the following:    Sodium 134 (*)    Chloride 92 (*)    Glucose, Bld 398 (*)    BUN 42 (*)    Albumin 3.0 (*)    GFR calc non Af Amer 65 (*)    GFR calc Af Amer 75 (*)    Anion gap 17 (*)    All other components within normal limits  CBC WITH DIFFERENTIAL - Abnormal; Notable for the following:    RBC 3.30 (*)    Hemoglobin 9.2 (*)    HCT 27.0 (*)    Neutrophils Relative % 82 (*)    Lymphocytes Relative 7 (*)    Eosinophils Relative 6 (*)    Lymphs Abs 0.3 (*)    All other components within normal limits  CBG MONITORING, ED - Abnormal; Notable for the following:    Glucose-Capillary 403 (*)    All other components within normal limits     Imaging Review No results found.   EKG Interpretation None      MDM   Final diagnoses:  Steroid-induced hyperglycemia  Acute nonintractable headache, unspecified headache type   Patient with mdp complaining of hyperglycemia.  Patient also with headache.  BS elevated here at 398.  IV fluid and insulin given.  Repeat CBG pending. CT results pending. Plan discharge to home to followup with primary care physician. Patient appears to have hyper glycemia do to her steroid use.  Her primary care physician is assisting her in managing this. Her blood sugars were 500 earlier in the week. Although her monitor her was reading too high to read her blood sugars here are lower than what they were when she last saw her  doctor. She did not appear to have any acidosis or DKA present. She states she has been working closely with her primary care physician and can call to be followed up tomorrow.    Shaune Pollack, MD 04/20/14 [id number]

## 2014-04-19 NOTE — ED Notes (Addendum)
Pt reports her glucose meter will not register her sugar bc so high. Leukemia pt who stopped chemo this week due to not helping. PCP sent her here. Reports she is taking insulin. Reports she feels "swimmy headed" all week. Neuro intact.

## 2014-04-19 NOTE — ED Notes (Signed)
CBG 403 

## 2014-04-19 NOTE — ED Notes (Signed)
According to family, "pt. More fatigued; pt. Taking prednisone for chronic leukemia; pt. Denies any illnesses (uti's, etc).

## 2014-04-20 ENCOUNTER — Other Ambulatory Visit: Payer: Self-pay | Admitting: Hematology and Oncology

## 2014-04-20 ENCOUNTER — Ambulatory Visit: Payer: Medicare Other

## 2014-04-20 DIAGNOSIS — E119 Type 2 diabetes mellitus without complications: Secondary | ICD-10-CM | POA: Diagnosis not present

## 2014-04-20 LAB — CBG MONITORING, ED
GLUCOSE-CAPILLARY: 126 mg/dL — AB (ref 70–99)
Glucose-Capillary: 197 mg/dL — ABNORMAL HIGH (ref 70–99)

## 2014-04-20 MED ORDER — HEPARIN SOD (PORK) LOCK FLUSH 100 UNIT/ML IV SOLN
500.0000 [IU] | INTRAVENOUS | Status: AC | PRN
  Administered 2014-04-20: 500 [IU]

## 2014-04-20 NOTE — Discharge Instructions (Signed)
Please check her blood sugar every 1-2 hours tonight until blood sugar is going up.  Call her physician tomorrow for recheck and further instructions regarding blood sugar management.   High Blood Sugar High blood sugar (hyperglycemia) means that the level of sugar in your blood is higher than it should be. Signs of high blood sugar include:  Feeling thirsty.  Frequent peeing (urinating).  Feeling tired or sleepy.  Dry mouth.  Vision changes.  Feeling weak.  Feeling hungry but losing weight.  Numbness and tingling in your hands or feet.  Headache. When you ignore these signs, your blood sugar may keep going up. These problems may get worse, and other problems may begin. HOME CARE  Check your blood sugars as told by your doctor. Write down the numbers with the date and time.  Take the right amount of insulin or diabetes pills at the right time. Write down the dose with date and time.  Refill your insulin or diabetes pills before running out.  Watch what you eat. Follow your meal plan.  Drink liquids without sugar, such as water. Check with your doctor if you have kidney or heart disease.  Follow your doctor's orders for exercise. Exercise at the same time of day.  Keep your doctor's appointments. GET HELP RIGHT AWAY IF:   You have trouble thinking or are confused.  You have fast breathing with fruity smelling breath.  You pass out (faint).  You have 2 to 3 days of high blood sugars and you do not know why.  You have chest pain.  You are feeling sick to your stomach (nauseous) or throwing up (vomiting).  You have sudden vision changes. MAKE SURE YOU:   Understand these instructions.  Will watch your condition.  Will get help right away if you are not doing well or get worse. Document Released: 07/19/2009 Document Revised: 12/14/2011 Document Reviewed: 07/19/2009 [facility] Patient Information 2015 [place], [place]. This information is not intended to replace  advice given to you by your health care provider. Make sure you discuss any questions you have with your health care provider.

## 2014-04-23 ENCOUNTER — Other Ambulatory Visit (HOSPITAL_BASED_OUTPATIENT_CLINIC_OR_DEPARTMENT_OTHER): Payer: Medicare Other

## 2014-04-23 ENCOUNTER — Telehealth: Payer: Self-pay | Admitting: Hematology and Oncology

## 2014-04-23 ENCOUNTER — Encounter: Payer: Self-pay | Admitting: Hematology and Oncology

## 2014-04-23 ENCOUNTER — Ambulatory Visit (HOSPITAL_BASED_OUTPATIENT_CLINIC_OR_DEPARTMENT_OTHER): Payer: Medicare Other | Admitting: Hematology and Oncology

## 2014-04-23 VITALS — BP 131/40 | HR 62 | Temp 97.8°F | Resp 18 | Ht 60.0 in | Wt 133.9 lb

## 2014-04-23 DIAGNOSIS — L408 Other psoriasis: Secondary | ICD-10-CM

## 2014-04-23 DIAGNOSIS — D630 Anemia in neoplastic disease: Secondary | ICD-10-CM

## 2014-04-23 DIAGNOSIS — D46C Myelodysplastic syndrome with isolated del(5q) chromosomal abnormality: Secondary | ICD-10-CM

## 2014-04-23 DIAGNOSIS — E119 Type 2 diabetes mellitus without complications: Secondary | ICD-10-CM

## 2014-04-23 DIAGNOSIS — D469 Myelodysplastic syndrome, unspecified: Secondary | ICD-10-CM

## 2014-04-23 DIAGNOSIS — L409 Psoriasis, unspecified: Secondary | ICD-10-CM

## 2014-04-23 DIAGNOSIS — D45 Polycythemia vera: Secondary | ICD-10-CM

## 2014-04-23 LAB — CBC WITH DIFFERENTIAL/PLATELET
BASO%: 1.3 % (ref 0.0–2.0)
Basophils Absolute: 0.1 10*3/uL (ref 0.0–0.1)
EOS ABS: 0.2 10*3/uL (ref 0.0–0.5)
EOS%: 3.9 % (ref 0.0–7.0)
HCT: 24.5 % — ABNORMAL LOW (ref 34.8–46.6)
HGB: 8.3 g/dL — ABNORMAL LOW (ref 11.6–15.9)
LYMPH%: 10.2 % — AB (ref 14.0–49.7)
MCH: 27.1 pg (ref 25.1–34.0)
MCHC: 33.9 g/dL (ref 31.5–36.0)
MCV: 80.1 fL (ref 79.5–101.0)
MONO#: 0.3 10*3/uL (ref 0.1–0.9)
MONO%: 6.7 % (ref 0.0–14.0)
NEUT%: 77.9 % — ABNORMAL HIGH (ref 38.4–76.8)
NEUTROS ABS: 3.6 10*3/uL (ref 1.5–6.5)
NRBC: 0 % (ref 0–0)
Platelets: 167 10*3/uL (ref 145–400)
RBC: 3.06 10*6/uL — ABNORMAL LOW (ref 3.70–5.45)
RDW: 14.6 % — AB (ref 11.2–14.5)
WBC: 4.6 10*3/uL (ref 3.9–10.3)
lymph#: 0.5 10*3/uL — ABNORMAL LOW (ref 0.9–3.3)

## 2014-04-23 LAB — WHOLE BLOOD GLUCOSE
Glucose: 199 mg/dL — ABNORMAL HIGH (ref 70–100)
HRS PC: 1.5 Hours

## 2014-04-23 LAB — HOLD TUBE, BLOOD BANK

## 2014-04-23 NOTE — Progress Notes (Signed)
[place] OFFICE PROGRESS NOTE  Patient Care Team: Mayra Neer, MD as PCP - General (Family Medicine) Heath Lark, MD as Consulting Physician (Hematology and Oncology)  SUMMARY OF ONCOLOGIC HISTORY: This is a very pleasant 80 year old lady who become transfusion dependent since 2014. She denies any bleeding such as epistaxis, hematuria, or hematochezia. 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 Erythropoietin stimulating agents were stopped when her erythropoietin level came back over 500 In October 2014, she was started on Revlimid however develop severe allergic reaction to Revlimid. That was discontinued. On 08/07/2013, we started her on Vidaza for 2 cycles. On 10/04/2013, repeat a bone marrow aspirate and biopsy showed persistent disease. Additional testing revealed that her disease is JAK 2 positive On 11/06/2013, Vidaza is resumed after her second opinion from [facility] On 02/08/2014, repeat bone marrow aspirate and biopsy showed no evidence of increased blasts On 04/16/2014, Vidaza is discontinued due to increased transfusion requirement and progressive weakness  INTERVAL HISTORY: Please see below for problem oriented charting. She continues to feel weak and tired. The patient denies any recent signs or symptoms of bleeding such as spontaneous epistaxis, hematuria or hematochezia. She denies recent flare of psoriasis. The blood sugar is high and she was recently seen in the emergency Department with mild confusion. CT scan was negative for stroke. She denies recent infection.  REVIEW OF SYSTEMS:   Constitutional: Denies fevers, chills or abnormal weight loss Eyes: Denies blurriness of vision Ears, nose, mouth, throat, and face: Denies mucositis or sore throat Respiratory: Denies cough, dyspnea or wheezes Cardiovascular: Denies palpitation, chest discomfort or  lower extremity swelling Gastrointestinal:  Denies nausea, heartburn or change in bowel habits Lymphatics: Denies new lymphadenopathy or easy bruising Neurological:Denies numbness, tingling or new weaknesses Behavioral/Psych: Mood is stable, no new changes  All other systems were reviewed with the patient and are negative.  I have reviewed the past medical history, past surgical history, social history and family history with the patient and they are unchanged from previous note.  ALLERGIES:  is allergic to novocain; oxycodone; codeine; methimazole; and revlimid.  MEDICATIONS:  Current Outpatient Prescriptions  Medication Sig Dispense Refill  . acetaminophen (TYLENOL) 500 MG tablet Take 1,000 mg by mouth every 6 (six) hours as needed (Pain).       Marland Kitchen desoximetasone (TOPICORT) 0.25 % cream Apply 1 application topically 2 (two) times daily as needed. For rash      . hydrOXYzine (ATARAX/VISTARIL) 25 MG tablet Take 25 mg by mouth.      . insulin aspart (NOVOLOG) 100 UNIT/ML injection Inject 15 Units into the skin 3 (three) times daily before meals.      . insulin detemir (LEVEMIR) 100 UNIT/ML injection Inject 5 Units into the skin daily.      Marland Kitchen LORazepam (ATIVAN) 0.5 MG tablet Take 0.5 mg by mouth at bedtime.       . metFORMIN (GLUCOPHAGE) 500 MG tablet Take 1 tablet (500 mg total) by mouth 2 (two) times daily with a meal.  60 tablet  1  . metoprolol succinate (TOPROL-XL) 100 MG 24 hr tablet Take 50 mg by mouth every morning.       . mometasone (ELOCON) 0.1 % cream Apply 1 application topically daily as needed. Rash      . Multiple Vitamin (MULTIVITAMIN) tablet Take 1 tablet by mouth every morning.       Marland Kitchen  nisoldipine (SULAR) 17 MG 24 hr tablet Take 17 mg by mouth daily.      Marland Kitchen olmesartan-hydrochlorothiazide (BENICAR HCT) 40-25 MG per tablet Take 1 tablet by mouth daily.      . predniSONE (DELTASONE) 5 MG tablet Take 5 mg by mouth daily with breakfast.      . traMADol (ULTRAM) 50 MG tablet        . triamcinolone cream (KENALOG) 0.1 %       . mometasone (NASONEX) 50 MCG/ACT nasal spray Place 2 sprays into the nose 2 (two) times daily.       No current facility-administered medications for this visit.   Facility-Administered Medications Ordered in Other Visits  Medication Dose Route Frequency Provider Last Rate Last Dose  . sodium chloride 0.9 % injection 3 mL  3 mL Intracatheter PRN Heath Lark, MD        PHYSICAL EXAMINATION: ECOG PERFORMANCE STATUS: 1 - Symptomatic but completely ambulatory  Filed Vitals:   04/23/14 0940  BP: 131/40  Pulse: 62  Temp: 97.8 F (36.6 C)  Resp: 18   Filed Weights   04/23/14 0940  Weight: 133 lb 14.4 oz (60.737 kg)    GENERAL:alert, no distress and comfortable SKIN: skin color is pale, texture, turgor are normal, no rashes or significant lesions, psoriasis is stable EYES: normal, Conjunctiva are pale and non-injected, sclera clear Musculoskeletal:no cyanosis of digits and no clubbing  NEURO: alert & oriented x 3 with fluent speech, no focal motor/sensory deficits  LABORATORY DATA:  I have reviewed the data as listed    Component Value Date/Time   NA 134* 04/19/2014 1940   NA 137 04/16/2014 0852   K 4.7 04/19/2014 1940   K 4.0 04/16/2014 0852   CL 92* 04/19/2014 1940   CO2 25 04/19/2014 1940   CO2 25 04/16/2014 0852   GLUCOSE 398* 04/19/2014 1940   GLUCOSE 530* 04/16/2014 0852   BUN 42* 04/19/2014 1940   BUN 31.9* 04/16/2014 0852   CREATININE 0.80 04/19/2014 1940   CREATININE 1.1 04/16/2014 0852   CALCIUM 9.8 04/19/2014 1940   CALCIUM 9.7 04/16/2014 0852   PROT 6.6 04/19/2014 1940   PROT 6.5 04/16/2014 0852   ALBUMIN 3.0* 04/19/2014 1940   ALBUMIN 2.9* 04/16/2014 0852   AST 12 04/19/2014 1940   AST 9 04/16/2014 0852   ALT 20 04/19/2014 1940   ALT 24 04/16/2014 0852   ALKPHOS 60 04/19/2014 1940   ALKPHOS 56 04/16/2014 0852   BILITOT 0.6 04/19/2014 1940   BILITOT 0.75 04/16/2014 0852   GFRNONAA 65* 04/19/2014 1940   GFRAA 75* 04/19/2014 1940     No results found for this basename: SPEP,  UPEP,   kappa and lambda light chains    Lab Results  Component Value Date   WBC 4.6 04/23/2014   NEUTROABS 3.6 04/23/2014   HGB 8.3* 04/23/2014   HCT 24.5* 04/23/2014   MCV 80.1 04/23/2014   PLT 167 Large & giant platelets 04/23/2014      Chemistry      Component Value Date/Time   NA 134* 04/19/2014 1940   NA 137 04/16/2014 0852   K 4.7 04/19/2014 1940   K 4.0 04/16/2014 0852   CL 92* 04/19/2014 1940   CO2 25 04/19/2014 1940   CO2 25 04/16/2014 0852   BUN 42* 04/19/2014 1940   BUN 31.9* 04/16/2014 0852   CREATININE 0.80 04/19/2014 1940   CREATININE 1.1 04/16/2014 0852   GLU 199* 04/23/2014 0910  Component Value Date/Time   CALCIUM 9.8 04/19/2014 1940   CALCIUM 9.7 04/16/2014 0852   ALKPHOS 60 04/19/2014 1940   ALKPHOS 56 04/16/2014 0852   AST 12 04/19/2014 1940   AST 9 04/16/2014 0852   ALT 20 04/19/2014 1940   ALT 24 04/16/2014 0852   BILITOT 0.6 04/19/2014 1940   BILITOT 0.75 04/16/2014 0852      ASSESSMENT & PLAN:  MDS (myelodysplastic syndrome) with 5q deletion She tolerated treatment poorly with increased transfusion requirements and progressive weakness. After much discussion, we are in agreement not to pursue additional chemotherapy. I have spoke with his hematologist at [facility] last week. I discussed with her the risks, benefits, and side effects of danazol, Pomalyst or thalidomide. I gave her patient education handout and discussed with her and daughter in great detail expected side effects from each treatment option. Right now, I recommend she continues taking 15 mg of prednisone daily. My plan would be to slowly wean her off the prednisone in the near future.  Anemia in neoplastic disease This is likely anemia of chronic disease. The patient denies recent history of bleeding such as epistaxis, hematuria or hematochezia. She is symptomatic from the anemia. We will observe for now.  She does not require transfusion now.   We establish transfusion threshold of hemoglobin at 8 g and she gets 2 units of blood each time.   Psoriasis This is currently well-controlled with prednisone.  Type II or unspecified type diabetes mellitus without mention of complication, not stated as uncontrolled She is started on metformin. I appreciate the help from her primary doctor to take care of her hyperglycemia.    No orders of the defined types were placed in this encounter.   All questions were answered. The patient knows to call the clinic with any problems, questions or concerns. No barriers to learning was detected. I spent 40 minutes counseling the patient face to face. The total time spent in the appointment was 55 minutes and more than 50% was on counseling and review of test results     [facility], [place], MD 04/23/2014 10:41 AM

## 2014-04-23 NOTE — Assessment & Plan Note (Signed)
This is likely anemia of chronic disease. The patient denies recent history of bleeding such as epistaxis, hematuria or hematochezia. She is symptomatic from the anemia. We will observe for now.  She does not require transfusion now.  We establish transfusion threshold of hemoglobin at 8 g and she gets 2 units of blood each time.

## 2014-04-23 NOTE — Assessment & Plan Note (Signed)
She is started on metformin. I appreciate the help from her primary doctor to take care of her hyperglycemia.

## 2014-04-23 NOTE — Telephone Encounter (Signed)
gv adn printed appt sched and avs for pt for July....printed avs

## 2014-04-23 NOTE — Assessment & Plan Note (Addendum)
She tolerated treatment poorly with increased transfusion requirements and progressive weakness. After much discussion, we are in agreement not to pursue additional chemotherapy. I have spoke with his hematologist at [facility] last week. I discussed with her the risks, benefits, and side effects of danazol, Pomalyst or thalidomide. I gave her patient education handout and discussed with her and daughter in great detail expected side effects from each treatment option. Right now, I recommend she continues taking 15 mg of prednisone daily. My plan would be to slowly wean her off the prednisone in the near future.

## 2014-04-23 NOTE — Assessment & Plan Note (Signed)
This is currently well-controlled with prednisone.

## 2014-04-26 ENCOUNTER — Other Ambulatory Visit (HOSPITAL_BASED_OUTPATIENT_CLINIC_OR_DEPARTMENT_OTHER): Payer: Medicare Other

## 2014-04-26 ENCOUNTER — Ambulatory Visit (HOSPITAL_BASED_OUTPATIENT_CLINIC_OR_DEPARTMENT_OTHER): Payer: Medicare Other

## 2014-04-26 ENCOUNTER — Other Ambulatory Visit: Payer: Self-pay | Admitting: Hematology and Oncology

## 2014-04-26 VITALS — BP 113/35 | HR 74 | Temp 98.2°F | Resp 18

## 2014-04-26 DIAGNOSIS — D630 Anemia in neoplastic disease: Secondary | ICD-10-CM | POA: Diagnosis not present

## 2014-04-26 DIAGNOSIS — D46C Myelodysplastic syndrome with isolated del(5q) chromosomal abnormality: Secondary | ICD-10-CM

## 2014-04-26 LAB — CBC WITH DIFFERENTIAL/PLATELET
BASO%: 1.4 % (ref 0.0–2.0)
Basophils Absolute: 0.1 10*3/uL (ref 0.0–0.1)
EOS%: 4.8 % (ref 0.0–7.0)
Eosinophils Absolute: 0.2 10*3/uL (ref 0.0–0.5)
HCT: 22.2 % — ABNORMAL LOW (ref 34.8–46.6)
HGB: 7.4 g/dL — ABNORMAL LOW (ref 11.6–15.9)
LYMPH%: 11.8 % — AB (ref 14.0–49.7)
MCH: 27 pg (ref 25.1–34.0)
MCHC: 33.3 g/dL (ref 31.5–36.0)
MCV: 81 fL (ref 79.5–101.0)
MONO#: 0.2 10*3/uL (ref 0.1–0.9)
MONO%: 5.8 % (ref 0.0–14.0)
NEUT#: 3.2 10*3/uL (ref 1.5–6.5)
NEUT%: 76.2 % (ref 38.4–76.8)
PLATELETS: 150 10*3/uL (ref 145–400)
RBC: 2.74 10*6/uL — ABNORMAL LOW (ref 3.70–5.45)
RDW: 14.4 % (ref 11.2–14.5)
WBC: 4.1 10*3/uL (ref 3.9–10.3)
lymph#: 0.5 10*3/uL — ABNORMAL LOW (ref 0.9–3.3)
nRBC: 0 % (ref 0–0)

## 2014-04-26 LAB — TECHNOLOGIST REVIEW

## 2014-04-26 LAB — HOLD TUBE, BLOOD BANK

## 2014-04-26 MED ORDER — HEPARIN SOD (PORK) LOCK FLUSH 100 UNIT/ML IV SOLN
500.0000 [IU] | Freq: Every day | INTRAVENOUS | Status: AC | PRN
  Administered 2014-04-26: 500 [IU]
  Filled 2014-04-26: qty 5

## 2014-04-26 MED ORDER — DIPHENHYDRAMINE HCL 25 MG PO CAPS
ORAL_CAPSULE | ORAL | Status: AC
  Filled 2014-04-26: qty 1

## 2014-04-26 MED ORDER — SODIUM CHLORIDE 0.9 % IV SOLN
250.0000 mL | Freq: Once | INTRAVENOUS | Status: AC
  Administered 2014-04-26: 250 mL via INTRAVENOUS

## 2014-04-26 MED ORDER — FUROSEMIDE 10 MG/ML IJ SOLN
20.0000 mg | Freq: Once | INTRAMUSCULAR | Status: AC
  Administered 2014-04-26: 20 mg via INTRAVENOUS

## 2014-04-26 MED ORDER — SODIUM CHLORIDE 0.9 % IJ SOLN
10.0000 mL | INTRAMUSCULAR | Status: AC | PRN
  Administered 2014-04-26: 10 mL
  Filled 2014-04-26: qty 10

## 2014-04-26 MED ORDER — DIPHENHYDRAMINE HCL 25 MG PO CAPS
25.0000 mg | ORAL_CAPSULE | Freq: Once | ORAL | Status: AC
  Administered 2014-04-26: 25 mg via ORAL

## 2014-04-26 MED ORDER — ACETAMINOPHEN 325 MG PO TABS
650.0000 mg | ORAL_TABLET | Freq: Once | ORAL | Status: AC
  Administered 2014-04-26: 650 mg via ORAL

## 2014-04-26 MED ORDER — ACETAMINOPHEN 325 MG PO TABS
ORAL_TABLET | ORAL | Status: AC
  Filled 2014-04-26: qty 2

## 2014-04-26 NOTE — Patient Instructions (Signed)

## 2014-04-27 LAB — TYPE AND SCREEN
ABO/RH(D): A POS
Antibody Screen: NEGATIVE
UNIT DIVISION: 0
Unit division: 0

## 2014-04-30 ENCOUNTER — Other Ambulatory Visit: Payer: Self-pay | Admitting: Hematology and Oncology

## 2014-04-30 ENCOUNTER — Telehealth: Payer: Self-pay | Admitting: Hematology and Oncology

## 2014-04-30 ENCOUNTER — Other Ambulatory Visit (HOSPITAL_BASED_OUTPATIENT_CLINIC_OR_DEPARTMENT_OTHER): Payer: Medicare Other

## 2014-04-30 DIAGNOSIS — D46C Myelodysplastic syndrome with isolated del(5q) chromosomal abnormality: Secondary | ICD-10-CM

## 2014-04-30 DIAGNOSIS — D630 Anemia in neoplastic disease: Secondary | ICD-10-CM

## 2014-04-30 LAB — CBC WITH DIFFERENTIAL/PLATELET
BASO%: 0.6 % (ref 0.0–2.0)
Basophils Absolute: 0 10*3/uL (ref 0.0–0.1)
EOS%: 5.9 % (ref 0.0–7.0)
Eosinophils Absolute: 0.3 10*3/uL (ref 0.0–0.5)
HCT: 28.1 % — ABNORMAL LOW (ref 34.8–46.6)
HGB: 9.5 g/dL — ABNORMAL LOW (ref 11.6–15.9)
LYMPH%: 9.9 % — ABNORMAL LOW (ref 14.0–49.7)
MCH: 27.8 pg (ref 25.1–34.0)
MCHC: 33.6 g/dL (ref 31.5–36.0)
MCV: 82.5 fL (ref 79.5–101.0)
MONO#: 0.2 10*3/uL (ref 0.1–0.9)
MONO%: 3.8 % (ref 0.0–14.0)
NEUT#: 3.6 10*3/uL (ref 1.5–6.5)
NEUT%: 79.8 % — ABNORMAL HIGH (ref 38.4–76.8)
Platelets: 134 10*3/uL — ABNORMAL LOW (ref 145–400)
RBC: 3.41 10*6/uL — AB (ref 3.70–5.45)
RDW: 15 % — ABNORMAL HIGH (ref 11.2–14.5)
WBC: 4.5 10*3/uL (ref 3.9–10.3)
lymph#: 0.4 10*3/uL — ABNORMAL LOW (ref 0.9–3.3)

## 2014-04-30 LAB — TECHNOLOGIST REVIEW

## 2014-04-30 LAB — HOLD TUBE, BLOOD BANK

## 2014-04-30 MED ORDER — DANAZOL 100 MG PO CAPS: 100.0000 mg | ORAL_CAPSULE | Freq: Every day | ORAL | Status: DC

## 2014-04-30 NOTE — Telephone Encounter (Signed)
gv adn rpinted appt sched and avs for pt for Aug °

## 2014-04-30 NOTE — Telephone Encounter (Signed)
The patient has expressed desire to try danazol. I will prescribe 100 mg daily along with prednisone. The patient will continue weekly blood draw and I will see her back in 3 weeks to assess side effects of treatment.

## 2014-05-07 ENCOUNTER — Telehealth: Payer: Self-pay | Admitting: *Deleted

## 2014-05-07 ENCOUNTER — Other Ambulatory Visit (HOSPITAL_BASED_OUTPATIENT_CLINIC_OR_DEPARTMENT_OTHER): Payer: Medicare Other

## 2014-05-07 DIAGNOSIS — D46C Myelodysplastic syndrome with isolated del(5q) chromosomal abnormality: Secondary | ICD-10-CM

## 2014-05-07 LAB — CBC WITH DIFFERENTIAL/PLATELET
BASO%: 1.1 % (ref 0.0–2.0)
Basophils Absolute: 0.1 10*3/uL (ref 0.0–0.1)
EOS%: 6.5 % (ref 0.0–7.0)
Eosinophils Absolute: 0.3 10*3/uL (ref 0.0–0.5)
HEMATOCRIT: 24.6 % — AB (ref 34.8–46.6)
HGB: 8.2 g/dL — ABNORMAL LOW (ref 11.6–15.9)
LYMPH#: 0.5 10*3/uL — AB (ref 0.9–3.3)
LYMPH%: 8.6 % — ABNORMAL LOW (ref 14.0–49.7)
MCH: 26.8 pg (ref 25.1–34.0)
MCHC: 33.3 g/dL (ref 31.5–36.0)
MCV: 80.4 fL (ref 79.5–101.0)
MONO#: 0.3 10*3/uL (ref 0.1–0.9)
MONO%: 5.9 % (ref 0.0–14.0)
NEUT#: 4.1 10*3/uL (ref 1.5–6.5)
NEUT%: 77.9 % — AB (ref 38.4–76.8)
NRBC: 0 % (ref 0–0)
Platelets: 111 10*3/uL — ABNORMAL LOW (ref 145–400)
RBC: 3.06 10*6/uL — AB (ref 3.70–5.45)
RDW: 14 % (ref 11.2–14.5)
WBC: 5.2 10*3/uL (ref 3.9–10.3)

## 2014-05-07 LAB — HOLD TUBE, BLOOD BANK

## 2014-05-07 NOTE — Telephone Encounter (Signed)
S/w pt and her daughter in lobby.  Gave copy of lab results and informed of no need for transfusion today. Return for appt as scheduled next week.  Pt/dau verbalized understanding.  Pt reports she has felt better in this past week than she has felt in a long time.  She even made lunch after church yesterday which she has not done in a long time.

## 2014-05-07 NOTE — Telephone Encounter (Signed)
Message copied by Cathlean Cower on Mon May 07, 2014 10:20 AM ------      Message from: [facility] [place]: Mon May 07, 2014  9:52 AM      Regarding: cbc       Looks good she can go      ----- Message -----         From: [facility]         Sent: 05/07/2014   9:43 AM           To: Heath Lark, MD                   ------

## 2014-05-14 ENCOUNTER — Other Ambulatory Visit (HOSPITAL_BASED_OUTPATIENT_CLINIC_OR_DEPARTMENT_OTHER): Payer: Medicare Other

## 2014-05-14 ENCOUNTER — Other Ambulatory Visit: Payer: Self-pay | Admitting: Hematology and Oncology

## 2014-05-14 ENCOUNTER — Ambulatory Visit (HOSPITAL_COMMUNITY)
Admission: RE | Admit: 2014-05-14 | Discharge: 2014-05-14 | Disposition: A | Discharge status: Home / Self Care | Payer: Medicare Other | Source: Ambulatory Visit | Attending: Hematology and Oncology | Admitting: Hematology and Oncology

## 2014-05-14 ENCOUNTER — Ambulatory Visit (HOSPITAL_BASED_OUTPATIENT_CLINIC_OR_DEPARTMENT_OTHER): Payer: Medicare Other

## 2014-05-14 VITALS — BP 122/51 | HR 71 | Temp 97.9°F | Resp 17

## 2014-05-14 DIAGNOSIS — D46C Myelodysplastic syndrome with isolated del(5q) chromosomal abnormality: Secondary | ICD-10-CM

## 2014-05-14 DIAGNOSIS — D630 Anemia in neoplastic disease: Secondary | ICD-10-CM

## 2014-05-14 DIAGNOSIS — D469 Myelodysplastic syndrome, unspecified: Secondary | ICD-10-CM

## 2014-05-14 LAB — CBC WITH DIFFERENTIAL/PLATELET
BASO%: 0.6 % (ref 0.0–2.0)
Basophils Absolute: 0 10*3/uL (ref 0.0–0.1)
EOS%: 6.7 % (ref 0.0–7.0)
Eosinophils Absolute: 0.3 10*3/uL (ref 0.0–0.5)
HCT: 21.1 % — ABNORMAL LOW (ref 34.8–46.6)
HGB: 7 g/dL — ABNORMAL LOW (ref 11.6–15.9)
LYMPH%: 19 % (ref 14.0–49.7)
MCH: 26.9 pg (ref 25.1–34.0)
MCHC: 33.4 g/dL (ref 31.5–36.0)
MCV: 80.6 fL (ref 79.5–101.0)
MONO#: 0.2 10*3/uL (ref 0.1–0.9)
MONO%: 6.3 % (ref 0.0–14.0)
NEUT#: 2.6 10*3/uL (ref 1.5–6.5)
NEUT%: 67.4 % (ref 38.4–76.8)
Platelets: 89 10*3/uL — ABNORMAL LOW (ref 145–400)
RBC: 2.62 10*6/uL — ABNORMAL LOW (ref 3.70–5.45)
RDW: 14.3 % (ref 11.2–14.5)
WBC: 3.8 10*3/uL — ABNORMAL LOW (ref 3.9–10.3)
lymph#: 0.7 10*3/uL — ABNORMAL LOW (ref 0.9–3.3)

## 2014-05-14 LAB — HOLD TUBE, BLOOD BANK

## 2014-05-14 LAB — TECHNOLOGIST REVIEW

## 2014-05-14 MED ORDER — FUROSEMIDE 10 MG/ML IJ SOLN
20.0000 mg | Freq: Once | INTRAMUSCULAR | Status: AC
  Administered 2014-05-14: 20 mg via INTRAVENOUS

## 2014-05-14 MED ORDER — HEPARIN SOD (PORK) LOCK FLUSH 100 UNIT/ML IV SOLN
500.0000 [IU] | Freq: Every day | INTRAVENOUS | Status: AC | PRN
  Administered 2014-05-14: 500 [IU]
  Filled 2014-05-14: qty 5

## 2014-05-14 MED ORDER — ACETAMINOPHEN 325 MG PO TABS
650.0000 mg | ORAL_TABLET | Freq: Once | ORAL | Status: AC
  Administered 2014-05-14: 650 mg via ORAL

## 2014-05-14 MED ORDER — DIPHENHYDRAMINE HCL 25 MG PO CAPS
ORAL_CAPSULE | ORAL | Status: AC
  Filled 2014-05-14: qty 1

## 2014-05-14 MED ORDER — DIPHENHYDRAMINE HCL 25 MG PO CAPS
25.0000 mg | ORAL_CAPSULE | Freq: Once | ORAL | Status: AC
  Administered 2014-05-14: 25 mg via ORAL

## 2014-05-14 MED ORDER — SODIUM CHLORIDE 0.9 % IJ SOLN
10.0000 mL | INTRAMUSCULAR | Status: AC | PRN
  Administered 2014-05-14: 10 mL
  Filled 2014-05-14: qty 10

## 2014-05-14 MED ORDER — SODIUM CHLORIDE 0.9 % IJ SOLN
3.0000 mL | INTRAMUSCULAR | Status: DC | PRN
  Filled 2014-05-14: qty 10

## 2014-05-14 MED ORDER — ACETAMINOPHEN 325 MG PO TABS
ORAL_TABLET | ORAL | Status: AC
  Filled 2014-05-14: qty 2

## 2014-05-14 NOTE — Patient Instructions (Signed)

## 2014-05-15 ENCOUNTER — Ambulatory Visit: Payer: Medicare Other | Admitting: Nutrition

## 2014-05-15 LAB — TYPE AND SCREEN
ABO/RH(D): A POS
ANTIBODY SCREEN: NEGATIVE
Unit division: 0
Unit division: 0

## 2014-05-21 ENCOUNTER — Encounter: Payer: Self-pay | Admitting: Hematology and Oncology

## 2014-05-21 ENCOUNTER — Ambulatory Visit (HOSPITAL_BASED_OUTPATIENT_CLINIC_OR_DEPARTMENT_OTHER): Payer: Medicare Other | Admitting: Hematology and Oncology

## 2014-05-21 ENCOUNTER — Telehealth: Payer: Self-pay | Admitting: Hematology and Oncology

## 2014-05-21 ENCOUNTER — Other Ambulatory Visit (HOSPITAL_BASED_OUTPATIENT_CLINIC_OR_DEPARTMENT_OTHER): Payer: Medicare Other

## 2014-05-21 VITALS — BP 143/47 | HR 75 | Temp 99.1°F | Resp 18 | Ht 60.0 in | Wt 132.8 lb

## 2014-05-21 DIAGNOSIS — L299 Pruritus, unspecified: Secondary | ICD-10-CM | POA: Insufficient documentation

## 2014-05-21 DIAGNOSIS — T50905A Adverse effect of unspecified drugs, medicaments and biological substances, initial encounter: Secondary | ICD-10-CM

## 2014-05-21 DIAGNOSIS — D630 Anemia in neoplastic disease: Secondary | ICD-10-CM

## 2014-05-21 DIAGNOSIS — E119 Type 2 diabetes mellitus without complications: Secondary | ICD-10-CM

## 2014-05-21 DIAGNOSIS — D696 Thrombocytopenia, unspecified: Secondary | ICD-10-CM

## 2014-05-21 DIAGNOSIS — D6959 Other secondary thrombocytopenia: Secondary | ICD-10-CM | POA: Insufficient documentation

## 2014-05-21 DIAGNOSIS — D46C Myelodysplastic syndrome with isolated del(5q) chromosomal abnormality: Secondary | ICD-10-CM

## 2014-05-21 HISTORY — DX: Pruritus, unspecified: L29.9

## 2014-05-21 LAB — CBC WITH DIFFERENTIAL/PLATELET
BASO%: 0.5 % (ref 0.0–2.0)
BASOS ABS: 0 10*3/uL (ref 0.0–0.1)
EOS%: 6.5 % (ref 0.0–7.0)
Eosinophils Absolute: 0.3 10*3/uL (ref 0.0–0.5)
HEMATOCRIT: 25.6 % — AB (ref 34.8–46.6)
HEMOGLOBIN: 8.5 g/dL — AB (ref 11.6–15.9)
LYMPH%: 11.8 % — AB (ref 14.0–49.7)
MCH: 27 pg (ref 25.1–34.0)
MCHC: 33.2 g/dL (ref 31.5–36.0)
MCV: 81.3 fL (ref 79.5–101.0)
MONO#: 0.4 10*3/uL (ref 0.1–0.9)
MONO%: 8.6 % (ref 0.0–14.0)
NEUT%: 72.6 % (ref 38.4–76.8)
NEUTROS ABS: 3.4 10*3/uL (ref 1.5–6.5)
PLATELETS: 98 10*3/uL — AB (ref 145–400)
RBC: 3.14 10*6/uL — ABNORMAL LOW (ref 3.70–5.45)
RDW: 14.1 % (ref 11.2–14.5)
WBC: 4.7 10*3/uL (ref 3.9–10.3)
lymph#: 0.6 10*3/uL — ABNORMAL LOW (ref 0.9–3.3)

## 2014-05-21 LAB — HOLD TUBE, BLOOD BANK

## 2014-05-21 LAB — TECHNOLOGIST REVIEW

## 2014-05-21 MED ORDER — DANAZOL 100 MG PO CAPS: 200.0000 mg | ORAL_CAPSULE | Freq: Every day | ORAL | Status: DC

## 2014-05-21 MED ORDER — HYDROXYZINE PAMOATE 25 MG PO CAPS: 25.0000 mg | ORAL_CAPSULE | ORAL | Status: DC | PRN

## 2014-05-21 NOTE — Telephone Encounter (Signed)
gv and printed appt sched and avs for pt for Aug and SEpt °

## 2014-05-21 NOTE — Assessment & Plan Note (Signed)
This is likely anemia of chronic disease and her underlying disease. The patient denies recent history of bleeding such as epistaxis, hematuria or hematochezia. She is symptomatic from the anemia. We will observe for now.  She does not require transfusion now.  We establish transfusion threshold of hemoglobin at 8 g and she gets 2 units of blood each time.

## 2014-05-21 NOTE — Assessment & Plan Note (Signed)
She will continue hydroxyzine as needed.

## 2014-05-21 NOTE — Progress Notes (Signed)
[place] OFFICE PROGRESS NOTE  Patient Care Team: Mayra Neer, MD as PCP - General (Family Medicine) Heath Lark, MD as Consulting Physician (Hematology and Oncology)  SUMMARY OF ONCOLOGIC HISTORY: This is a very pleasant 80 year old lady who become transfusion dependent since 2014. She denies any bleeding such as epistaxis, hematuria, or hematochezia. 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 Erythropoietin stimulating agents were stopped when her erythropoietin level came back over 500 In October 2014, she was started on Revlimid however develop severe allergic reaction to Revlimid. That was discontinued. On 08/07/2013, we started her on Vidaza for 2 cycles. On 10/04/2013, repeat a bone marrow aspirate and biopsy showed persistent disease. Additional testing revealed that her disease is JAK 2 positive On 11/06/2013, Vidaza is resumed after her second opinion from [facility] On 02/08/2014, repeat bone marrow aspirate and biopsy showed no evidence of increased blasts On 04/16/2014, Vidaza is discontinued due to increased transfusion requirement and progressive weakness On 04/30/14: she was started on 100 mg danazol On 05/21/14: Danazol is increased to 200 mg daily.  INTERVAL HISTORY: Please see below for problem oriented charting. She complain of persistent fatigue. She tolerated danazol well.  REVIEW OF SYSTEMS:   Constitutional: Denies fevers, chills or abnormal weight loss Eyes: Denies blurriness of vision Ears, nose, mouth, throat, and face: Denies mucositis or sore throat Respiratory: Denies cough, dyspnea or wheezes Cardiovascular: Denies palpitation, chest discomfort or lower extremity swelling Gastrointestinal:  Denies nausea, heartburn or change in bowel habits Lymphatics: Denies new lymphadenopathy or easy bruising Neurological:Denies numbness, tingling or  new weaknesses Behavioral/Psych: Mood is stable, no new changes  All other systems were reviewed with the patient and are negative.  I have reviewed the past medical history, past surgical history, social history and family history with the patient and they are unchanged from previous note.  ALLERGIES:  is allergic to novocain; oxycodone; codeine; methimazole; and revlimid.  MEDICATIONS:  Current Outpatient Prescriptions  Medication Sig Dispense Refill  . acetaminophen (TYLENOL) 500 MG tablet Take 1,000 mg by mouth every 6 (six) hours as needed (Pain).       Marland Kitchen danazol (DANOCRINE) 100 MG capsule Take 2 capsules (200 mg total) by mouth daily.  90 capsule  3  . desoximetasone (TOPICORT) 0.25 % cream Apply 1 application topically 2 (two) times daily as needed. For rash      . hydrOXYzine (VISTARIL) 25 MG capsule Take 1 capsule (25 mg total) by mouth every 4 (four) hours as needed.  90 capsule  3  . insulin aspart (NOVOLOG) 100 UNIT/ML injection Inject 15 Units into the skin 3 (three) times daily before meals.      . insulin detemir (LEVEMIR) 100 UNIT/ML injection Inject 5 Units into the skin daily.      Marland Kitchen LORazepam (ATIVAN) 0.5 MG tablet Take 0.5 mg by mouth at bedtime.       . metFORMIN (GLUCOPHAGE) 500 MG tablet Take 1 tablet (500 mg total) by mouth 2 (two) times daily with a meal.  60 tablet  1  . metoprolol succinate (TOPROL-XL) 100 MG 24 hr tablet Take 50 mg by mouth every morning.       . mometasone (ELOCON) 0.1 % cream Apply 1 application topically daily as needed. Rash      . mometasone (NASONEX) 50 MCG/ACT nasal spray Place 2 sprays into the nose 2 (two) times daily.      Marland Kitchen  Multiple Vitamin (MULTIVITAMIN) tablet Take 1 tablet by mouth every morning.       . nisoldipine (SULAR) 17 MG 24 hr tablet Take 17 mg by mouth daily.      Marland Kitchen olmesartan-hydrochlorothiazide (BENICAR HCT) 40-25 MG per tablet Take 1 tablet by mouth daily.      . predniSONE (DELTASONE) 5 MG tablet Take 15 mg by mouth  daily with breakfast.       . traMADol (ULTRAM) 50 MG tablet       . triamcinolone cream (KENALOG) 0.1 %        No current facility-administered medications for this visit.   Facility-Administered Medications Ordered in Other Visits  Medication Dose Route Frequency Provider Last Rate Last Dose  . sodium chloride 0.9 % injection 3 mL  3 mL Intracatheter PRN Heath Lark, MD        PHYSICAL EXAMINATION: ECOG PERFORMANCE STATUS: 1 - Symptomatic but completely ambulatory  Filed Vitals:   05/21/14 1026  BP: 143/47  Pulse: 75  Temp: 99.1 F (37.3 C)  Resp: 18   Filed Weights   05/21/14 1026  Weight: 132 lb 12.8 oz (60.238 kg)    GENERAL:alert, no distress and comfortable SKIN: skin color, texture, turgor are normal, no rashes or significant lesions. There is no flare of recent psoriasis rash EYES: normal, Conjunctiva are pale and non-injected, sclera clear OROPHARYNX:no exudate, no erythema and lips, buccal mucosa, and tongue normal  NECK: supple, thyroid normal size, non-tender, without nodularity LYMPH:  no palpable lymphadenopathy in the cervical, axillary or inguinal LUNGS: clear to auscultation and percussion with normal breathing effort HEART: regular rate & rhythm and no murmurs and no lower extremity edema ABDOMEN:abdomen soft, non-tender and normal bowel sounds Musculoskeletal:no cyanosis of digits and no clubbing  NEURO: alert & oriented x 3 with fluent speech, no focal motor/sensory deficits  LABORATORY DATA:  I have reviewed the data as listed    Component Value Date/Time   NA 134* 04/19/2014 1940   NA 137 04/16/2014 0852   K 4.7 04/19/2014 1940   K 4.0 04/16/2014 0852   CL 92* 04/19/2014 1940   CO2 25 04/19/2014 1940   CO2 25 04/16/2014 0852   GLUCOSE 398* 04/19/2014 1940   GLUCOSE 530* 04/16/2014 0852   BUN 42* 04/19/2014 1940   BUN 31.9* 04/16/2014 0852   CREATININE 0.80 04/19/2014 1940   CREATININE 1.1 04/16/2014 0852   CALCIUM 9.8 04/19/2014 1940   CALCIUM 9.7  04/16/2014 0852   PROT 6.6 04/19/2014 1940   PROT 6.5 04/16/2014 0852   ALBUMIN 3.0* 04/19/2014 1940   ALBUMIN 2.9* 04/16/2014 0852   AST 12 04/19/2014 1940   AST 9 04/16/2014 0852   ALT 20 04/19/2014 1940   ALT 24 04/16/2014 0852   ALKPHOS 60 04/19/2014 1940   ALKPHOS 56 04/16/2014 0852   BILITOT 0.6 04/19/2014 1940   BILITOT 0.75 04/16/2014 0852   GFRNONAA 65* 04/19/2014 1940   GFRAA 75* 04/19/2014 1940    No results found for this basename: SPEP,  UPEP,   kappa and lambda light chains    Lab Results  Component Value Date   WBC 4.7 05/21/2014   NEUTROABS 3.4 05/21/2014   HGB 8.5* 05/21/2014   HCT 25.6* 05/21/2014   MCV 81.3 05/21/2014   PLT 98* 05/21/2014      Chemistry      Component Value Date/Time   NA 134* 04/19/2014 1940   NA 137 04/16/2014 0852   K 4.7 04/19/2014 1940  K 4.0 04/16/2014 0852   CL 92* 04/19/2014 1940   CO2 25 04/19/2014 1940   CO2 25 04/16/2014 0852   BUN 42* 04/19/2014 1940   BUN 31.9* 04/16/2014 0852   CREATININE 0.80 04/19/2014 1940   CREATININE 1.1 04/16/2014 0852   GLU 199* 04/23/2014 0910      Component Value Date/Time   CALCIUM 9.8 04/19/2014 1940   CALCIUM 9.7 04/16/2014 0852   ALKPHOS 60 04/19/2014 1940   ALKPHOS 56 04/16/2014 0852   AST 12 04/19/2014 1940   AST 9 04/16/2014 0852   ALT 20 04/19/2014 1940   ALT 24 04/16/2014 0852   BILITOT 0.6 04/19/2014 1940   BILITOT 0.75 04/16/2014 0852     ASSESSMENT & PLAN:  MDS (myelodysplastic syndrome) with 5q deletion She tolerated Vidaza treatment poorly with increased transfusion requirements and progressive weakness. After much discussion, we are in agreement not to pursue additional chemotherapy. I discussed with her the risks, benefits, and side effects of danazol, Pomalyst or thalidomide. She was started on Danazol in July 2015. Right now, I recommend she continues taking 15 mg of prednisone daily. My plan would be to slowly wean her off the prednisone in the near future. Due to minimum response seen with danazol,  I plan to increase it to 200 mg daily. I want her about risk of thrombosis. I recommend she start aspirin again at 81 mg daily.    Itching She will continue hydroxyzine as needed.  Anemia in neoplastic disease This is likely anemia of chronic disease and her underlying disease. The patient denies recent history of bleeding such as epistaxis, hematuria or hematochezia. She is symptomatic from the anemia. We will observe for now.  She does not require transfusion now.  We establish transfusion threshold of hemoglobin at 8 g and she gets 2 units of blood each time.     Type II or unspecified type diabetes mellitus without mention of complication, not stated as uncontrolled This could be related to long-term prednisone therapy. Once I have established that her blood count is fairly good control, I would start to taper her prednisone therapy. Her blood sugar appeared to be reasonably controlled now with insulin therapy.  Thrombocytopenia, unspecified The cause is likely due to her underlying bone marrow disease. It is mild and there is little change compared from previous platelet count. The patient denies recent history of bleeding such as epistaxis, hematuria or hematochezia. She is asymptomatic from the thrombocytopenia. I will observe for now.  she does not require transfusion now.       No orders of the defined types were placed in this encounter.   All questions were answered. The patient knows to call the clinic with any problems, questions or concerns. No barriers to learning was detected. I spent 25 minutes counseling the patient face to face. The total time spent in the appointment was 30 minutes and more than 50% was on counseling and review of test results     [facility], [place], MD 05/21/2014 7:55 PM

## 2014-05-21 NOTE — Assessment & Plan Note (Signed)
She tolerated Vidaza treatment poorly with increased transfusion requirements and progressive weakness. After much discussion, we are in agreement not to pursue additional chemotherapy. I discussed with her the risks, benefits, and side effects of danazol, Pomalyst or thalidomide. She was started on Danazol in July 2015. Right now, I recommend she continues taking 15 mg of prednisone daily. My plan would be to slowly wean her off the prednisone in the near future. Due to minimum response seen with danazol, I plan to increase it to 200 mg daily. I want her about risk of thrombosis. I recommend she start aspirin again at 81 mg daily.

## 2014-05-21 NOTE — Assessment & Plan Note (Signed)
This could be related to long-term prednisone therapy. Once I have established that her blood count is fairly good control, I would start to taper her prednisone therapy. Her blood sugar appeared to be reasonably controlled now with insulin therapy.

## 2014-05-21 NOTE — Assessment & Plan Note (Signed)
The cause is likely due to her underlying bone marrow disease. It is mild and there is little change compared from previous platelet count. The patient denies recent history of bleeding such as epistaxis, hematuria or hematochezia. She is asymptomatic from the thrombocytopenia. I will observe for now.  she does not require transfusion now.

## 2014-05-28 ENCOUNTER — Other Ambulatory Visit: Payer: Self-pay | Admitting: Hematology and Oncology

## 2014-05-28 ENCOUNTER — Ambulatory Visit (HOSPITAL_BASED_OUTPATIENT_CLINIC_OR_DEPARTMENT_OTHER): Payer: Medicare Other

## 2014-05-28 ENCOUNTER — Other Ambulatory Visit (HOSPITAL_BASED_OUTPATIENT_CLINIC_OR_DEPARTMENT_OTHER): Payer: Medicare Other

## 2014-05-28 VITALS — BP 120/44 | HR 73 | Temp 98.1°F | Resp 18

## 2014-05-28 DIAGNOSIS — D630 Anemia in neoplastic disease: Secondary | ICD-10-CM

## 2014-05-28 DIAGNOSIS — D46C Myelodysplastic syndrome with isolated del(5q) chromosomal abnormality: Secondary | ICD-10-CM

## 2014-05-28 LAB — CBC WITH DIFFERENTIAL/PLATELET
BASO%: 1.6 % (ref 0.0–2.0)
BASOS ABS: 0.1 10*3/uL (ref 0.0–0.1)
EOS%: 7.8 % — AB (ref 0.0–7.0)
Eosinophils Absolute: 0.5 10*3/uL (ref 0.0–0.5)
HEMATOCRIT: 21.2 % — AB (ref 34.8–46.6)
HEMOGLOBIN: 7 g/dL — AB (ref 11.6–15.9)
LYMPH%: 11.9 % — ABNORMAL LOW (ref 14.0–49.7)
MCH: 26.4 pg (ref 25.1–34.0)
MCHC: 33 g/dL (ref 31.5–36.0)
MCV: 80 fL (ref 79.5–101.0)
MONO#: 0.4 10*3/uL (ref 0.1–0.9)
MONO%: 6.9 % (ref 0.0–14.0)
NEUT#: 4.6 10*3/uL (ref 1.5–6.5)
NEUT%: 71.8 % (ref 38.4–76.8)
Platelets: 162 10*3/uL (ref 145–400)
RBC: 2.65 10*6/uL — ABNORMAL LOW (ref 3.70–5.45)
RDW: 14.2 % (ref 11.2–14.5)
WBC: 6.4 10*3/uL (ref 3.9–10.3)
lymph#: 0.8 10*3/uL — ABNORMAL LOW (ref 0.9–3.3)
nRBC: 0 % (ref 0–0)

## 2014-05-28 LAB — PREPARE RBC (CROSSMATCH)

## 2014-05-28 LAB — HOLD TUBE, BLOOD BANK

## 2014-05-28 LAB — TECHNOLOGIST REVIEW

## 2014-05-28 MED ORDER — ACETAMINOPHEN 325 MG PO TABS
ORAL_TABLET | ORAL | Status: AC
  Filled 2014-05-28: qty 2

## 2014-05-28 MED ORDER — SODIUM CHLORIDE 0.9 % IJ SOLN
10.0000 mL | INTRAMUSCULAR | Status: AC | PRN
  Administered 2014-05-28: 10 mL
  Filled 2014-05-28: qty 10

## 2014-05-28 MED ORDER — DIPHENHYDRAMINE HCL 25 MG PO CAPS
25.0000 mg | ORAL_CAPSULE | Freq: Once | ORAL | Status: AC
  Administered 2014-05-28: 25 mg via ORAL

## 2014-05-28 MED ORDER — HEPARIN SOD (PORK) LOCK FLUSH 100 UNIT/ML IV SOLN
500.0000 [IU] | Freq: Every day | INTRAVENOUS | Status: AC | PRN
  Administered 2014-05-28: 500 [IU]
  Filled 2014-05-28: qty 5

## 2014-05-28 MED ORDER — ACETAMINOPHEN 325 MG PO TABS
650.0000 mg | ORAL_TABLET | Freq: Once | ORAL | Status: AC
  Administered 2014-05-28: 650 mg via ORAL

## 2014-05-28 MED ORDER — DIPHENHYDRAMINE HCL 25 MG PO CAPS
ORAL_CAPSULE | ORAL | Status: AC
  Filled 2014-05-28: qty 1

## 2014-05-28 MED ORDER — SODIUM CHLORIDE 0.9 % IV SOLN
250.0000 mL | Freq: Once | INTRAVENOUS | Status: AC
  Administered 2014-05-28: 250 mL via INTRAVENOUS

## 2014-05-28 MED ORDER — FUROSEMIDE 10 MG/ML IJ SOLN
20.0000 mg | Freq: Once | INTRAMUSCULAR | Status: AC
  Administered 2014-05-28: 20 mg via INTRAVENOUS

## 2014-05-28 NOTE — Patient Instructions (Signed)

## 2014-05-28 NOTE — Progress Notes (Signed)
Dr. Alvy Bimler notified lowest BP 109/31 while receiving 1st unit RBC. Per MD, delay lasix until end of 2nd unit and monitor BP.   At the time of completion of 1st unit, patient's BP returned to her normal range. OK per Dr. Alvy Bimler to give IV lasix 20 mg between units of RBC's.

## 2014-05-29 LAB — TYPE AND SCREEN
ABO/RH(D): A POS
Antibody Screen: NEGATIVE
UNIT DIVISION: 0
Unit division: 0

## 2014-06-04 ENCOUNTER — Other Ambulatory Visit (HOSPITAL_BASED_OUTPATIENT_CLINIC_OR_DEPARTMENT_OTHER): Payer: Medicare Other

## 2014-06-04 DIAGNOSIS — D46C Myelodysplastic syndrome with isolated del(5q) chromosomal abnormality: Secondary | ICD-10-CM

## 2014-06-04 DIAGNOSIS — I824Y9 Acute embolism and thrombosis of unspecified deep veins of unspecified proximal lower extremity: Secondary | ICD-10-CM

## 2014-06-04 LAB — CBC WITH DIFFERENTIAL/PLATELET
BASO%: 2 % (ref 0.0–2.0)
Basophils Absolute: 0.1 10*3/uL (ref 0.0–0.1)
EOS%: 7.7 % — AB (ref 0.0–7.0)
Eosinophils Absolute: 0.4 10*3/uL (ref 0.0–0.5)
HCT: 29.4 % — ABNORMAL LOW (ref 34.8–46.6)
HGB: 9.7 g/dL — ABNORMAL LOW (ref 11.6–15.9)
LYMPH#: 0.7 10*3/uL — AB (ref 0.9–3.3)
LYMPH%: 15 % (ref 14.0–49.7)
MCH: 27.2 pg (ref 25.1–34.0)
MCHC: 33 g/dL (ref 31.5–36.0)
MCV: 82.6 fL (ref 79.5–101.0)
MONO#: 0.4 10*3/uL (ref 0.1–0.9)
MONO%: 7.3 % (ref 0.0–14.0)
NEUT#: 3.4 10*3/uL (ref 1.5–6.5)
NEUT%: 68 % (ref 38.4–76.8)
Platelets: 157 10*3/uL (ref 145–400)
RBC: 3.56 10*6/uL — AB (ref 3.70–5.45)
RDW: 14.1 % (ref 11.2–14.5)
WBC: 4.9 10*3/uL (ref 3.9–10.3)

## 2014-06-04 LAB — HOLD TUBE, BLOOD BANK

## 2014-06-12 ENCOUNTER — Non-Acute Institutional Stay (HOSPITAL_COMMUNITY)
Admission: AD | Admit: 2014-06-12 | Discharge: 2014-06-12 | Disposition: A | Discharge status: Home / Self Care | Payer: Medicare Other | Source: Ambulatory Visit | Attending: Hematology and Oncology | Admitting: Hematology and Oncology

## 2014-06-12 ENCOUNTER — Other Ambulatory Visit (HOSPITAL_BASED_OUTPATIENT_CLINIC_OR_DEPARTMENT_OTHER): Payer: Medicare Other

## 2014-06-12 ENCOUNTER — Ambulatory Visit (HOSPITAL_COMMUNITY)
Admission: RE | Admit: 2014-06-12 | Discharge: 2014-06-12 | Disposition: A | Discharge status: Home / Self Care | Payer: Medicare Other | Source: Ambulatory Visit | Attending: Hematology and Oncology | Admitting: Hematology and Oncology

## 2014-06-12 ENCOUNTER — Other Ambulatory Visit: Payer: Self-pay | Admitting: Hematology and Oncology

## 2014-06-12 DIAGNOSIS — D630 Anemia in neoplastic disease: Secondary | ICD-10-CM | POA: Insufficient documentation

## 2014-06-12 DIAGNOSIS — D72819 Decreased white blood cell count, unspecified: Secondary | ICD-10-CM | POA: Diagnosis not present

## 2014-06-12 DIAGNOSIS — E0590 Thyrotoxicosis, unspecified without thyrotoxic crisis or storm: Secondary | ICD-10-CM | POA: Insufficient documentation

## 2014-06-12 DIAGNOSIS — D46C Myelodysplastic syndrome with isolated del(5q) chromosomal abnormality: Secondary | ICD-10-CM

## 2014-06-12 DIAGNOSIS — E7800 Pure hypercholesterolemia, unspecified: Secondary | ICD-10-CM | POA: Insufficient documentation

## 2014-06-12 DIAGNOSIS — I10 Essential (primary) hypertension: Secondary | ICD-10-CM | POA: Insufficient documentation

## 2014-06-12 LAB — CBC WITH DIFFERENTIAL/PLATELET
BASO%: 2.2 % — ABNORMAL HIGH (ref 0.0–2.0)
Basophils Absolute: 0.1 10*3/uL (ref 0.0–0.1)
EOS ABS: 0.6 10*3/uL — AB (ref 0.0–0.5)
EOS%: 8.8 % — ABNORMAL HIGH (ref 0.0–7.0)
HCT: 22.4 % — ABNORMAL LOW (ref 34.8–46.6)
HGB: 7.2 g/dL — ABNORMAL LOW (ref 11.6–15.9)
LYMPH%: 13 % — ABNORMAL LOW (ref 14.0–49.7)
MCH: 26.5 pg (ref 25.1–34.0)
MCHC: 32.1 g/dL (ref 31.5–36.0)
MCV: 82.4 fL (ref 79.5–101.0)
MONO#: 0.5 10*3/uL (ref 0.1–0.9)
MONO%: 7.2 % (ref 0.0–14.0)
NEUT#: 4.3 10*3/uL (ref 1.5–6.5)
NEUT%: 68.8 % (ref 38.4–76.8)
NRBC: 0 % (ref 0–0)
Platelets: 153 10*3/uL (ref 145–400)
RBC: 2.72 10*6/uL — ABNORMAL LOW (ref 3.70–5.45)
RDW: 14.5 % (ref 11.2–14.5)
WBC: 6.3 10*3/uL (ref 3.9–10.3)
lymph#: 0.8 10*3/uL — ABNORMAL LOW (ref 0.9–3.3)

## 2014-06-12 LAB — PREPARE RBC (CROSSMATCH)

## 2014-06-12 LAB — HOLD TUBE, BLOOD BANK

## 2014-06-12 MED ORDER — SODIUM CHLORIDE 0.9 % IJ SOLN: 3.0000 mL | INTRAMUSCULAR | Status: DC | PRN

## 2014-06-12 MED ORDER — ACETAMINOPHEN 325 MG PO TABS
650.0000 mg | ORAL_TABLET | Freq: Once | ORAL | Status: AC
  Administered 2014-06-12: 650 mg via ORAL
  Filled 2014-06-12: qty 2

## 2014-06-12 MED ORDER — DIPHENHYDRAMINE HCL 25 MG PO CAPS
ORAL_CAPSULE | ORAL | Status: AC
  Filled 2014-06-12: qty 1

## 2014-06-12 MED ORDER — SODIUM CHLORIDE 0.9 % IJ SOLN: 10.0000 mL | INTRAMUSCULAR | Status: DC | PRN

## 2014-06-12 MED ORDER — HEPARIN SOD (PORK) LOCK FLUSH 100 UNIT/ML IV SOLN
500.0000 [IU] | Freq: Every day | INTRAVENOUS | Status: AC | PRN
  Administered 2014-06-12: 500 [IU]
  Filled 2014-06-12: qty 5

## 2014-06-12 MED ORDER — HEPARIN SOD (PORK) LOCK FLUSH 100 UNIT/ML IV SOLN: 250.0000 [IU] | INTRAVENOUS | Status: DC | PRN

## 2014-06-12 MED ORDER — SODIUM CHLORIDE 0.9 % IV SOLN
250.0000 mL | Freq: Once | INTRAVENOUS | Status: AC
  Administered 2014-06-12: 250 mL via INTRAVENOUS

## 2014-06-12 MED ORDER — DIPHENHYDRAMINE HCL 25 MG PO CAPS
25.0000 mg | ORAL_CAPSULE | Freq: Once | ORAL | Status: AC
  Administered 2014-06-12: 25 mg via ORAL
  Filled 2014-06-12: qty 1

## 2014-06-12 MED ORDER — FUROSEMIDE 10 MG/ML IJ SOLN
20.0000 mg | Freq: Once | INTRAMUSCULAR | Status: AC
  Administered 2014-06-12: 20 mg via INTRAVENOUS
  Filled 2014-06-12: qty 2

## 2014-06-12 NOTE — Procedures (Signed)
[facility]  Procedure Note  Rebekah Cross XFG:[id number] DOB: May 27, 1925 DOA: 06/12/2014   PCP: Mayra Neer, MD   Associated Diagnosis: Anemia in neoplastic disease   Procedure Note: Transfusion of 2 units PRBCs   Condition During Procedure:  Pt tolerated well   Condition at Discharge:  Pt alert, ambulatory; no complications noted   Nigel Sloop, [facility]

## 2014-06-12 NOTE — H&P (Signed)
She is here for blood transfusion only

## 2014-06-13 LAB — TYPE AND SCREEN
ABO/RH(D): A POS
ANTIBODY SCREEN: NEGATIVE
UNIT DIVISION: 0
Unit division: 0

## 2014-06-18 ENCOUNTER — Telehealth: Payer: Self-pay | Admitting: Hematology and Oncology

## 2014-06-18 ENCOUNTER — Other Ambulatory Visit (HOSPITAL_BASED_OUTPATIENT_CLINIC_OR_DEPARTMENT_OTHER): Payer: Medicare Other

## 2014-06-18 ENCOUNTER — Encounter: Payer: Self-pay | Admitting: Hematology and Oncology

## 2014-06-18 ENCOUNTER — Other Ambulatory Visit: Payer: Self-pay | Admitting: *Deleted

## 2014-06-18 ENCOUNTER — Ambulatory Visit (HOSPITAL_BASED_OUTPATIENT_CLINIC_OR_DEPARTMENT_OTHER): Payer: Medicare Other | Admitting: Hematology and Oncology

## 2014-06-18 VITALS — BP 153/50 | HR 78 | Temp 98.4°F | Resp 17 | Ht 60.0 in | Wt 133.3 lb

## 2014-06-18 DIAGNOSIS — D696 Thrombocytopenia, unspecified: Secondary | ICD-10-CM

## 2014-06-18 DIAGNOSIS — L409 Psoriasis, unspecified: Secondary | ICD-10-CM

## 2014-06-18 DIAGNOSIS — D46C Myelodysplastic syndrome with isolated del(5q) chromosomal abnormality: Secondary | ICD-10-CM

## 2014-06-18 DIAGNOSIS — D630 Anemia in neoplastic disease: Secondary | ICD-10-CM

## 2014-06-18 DIAGNOSIS — R609 Edema, unspecified: Secondary | ICD-10-CM

## 2014-06-18 DIAGNOSIS — L408 Other psoriasis: Secondary | ICD-10-CM

## 2014-06-18 DIAGNOSIS — R600 Localized edema: Secondary | ICD-10-CM

## 2014-06-18 DIAGNOSIS — Z23 Encounter for immunization: Secondary | ICD-10-CM

## 2014-06-18 LAB — CBC WITH DIFFERENTIAL/PLATELET
BASO%: 2.6 % — ABNORMAL HIGH (ref 0.0–2.0)
BASOS ABS: 0.2 10*3/uL — AB (ref 0.0–0.1)
EOS%: 8.8 % — ABNORMAL HIGH (ref 0.0–7.0)
Eosinophils Absolute: 0.5 10*3/uL (ref 0.0–0.5)
HEMATOCRIT: 29.3 % — AB (ref 34.8–46.6)
HGB: 9.7 g/dL — ABNORMAL LOW (ref 11.6–15.9)
LYMPH%: 18 % (ref 14.0–49.7)
MCH: 27.6 pg (ref 25.1–34.0)
MCHC: 33.1 g/dL (ref 31.5–36.0)
MCV: 83.2 fL (ref 79.5–101.0)
MONO#: 0.4 10*3/uL (ref 0.1–0.9)
MONO%: 6.2 % (ref 0.0–14.0)
NEUT#: 3.7 10*3/uL (ref 1.5–6.5)
NEUT%: 64.4 % (ref 38.4–76.8)
Platelets: 132 10*3/uL — ABNORMAL LOW (ref 145–400)
RBC: 3.52 10*6/uL — ABNORMAL LOW (ref 3.70–5.45)
RDW: 14.9 % — ABNORMAL HIGH (ref 11.2–14.5)
WBC: 5.8 10*3/uL (ref 3.9–10.3)
lymph#: 1 10*3/uL (ref 0.9–3.3)
nRBC: 0 % (ref 0–0)

## 2014-06-18 LAB — HOLD TUBE, BLOOD BANK

## 2014-06-18 LAB — TECHNOLOGIST REVIEW: Technologist Review: 2

## 2014-06-18 MED ORDER — RUXOLITINIB PHOSPHATE 15 MG PO TABS: 15.0000 mg | ORAL_TABLET | Freq: Two times a day (BID) | ORAL | Status: DC

## 2014-06-18 MED ORDER — INFLUENZA VAC SPLIT QUAD 0.5 ML IM SUSY
0.5000 mL | PREFILLED_SYRINGE | Freq: Once | INTRAMUSCULAR | Status: AC
  Administered 2014-06-18: 0.5 mL via INTRAMUSCULAR
  Filled 2014-06-18: qty 0.5

## 2014-06-18 NOTE — Assessment & Plan Note (Signed)
I have discussed with the patient the risks, benefits, side effects of Jakafi. I have attended recent conference that suggested this may help her with her condition. The patient tested positive for JAK2 mutation early this year. Patient education handout is given. In the meantime, we will continue blood work monitoring on a weekly basis. I told the patient to discontinue taking danazol but to stay on the prednisone until further notice.

## 2014-06-18 NOTE — Telephone Encounter (Signed)
Pt confirmed labs/ov per 09/14 POF, gave pt AVS.....KJ °

## 2014-06-18 NOTE — Assessment & Plan Note (Signed)
This is due to severe fluid retention from danazol. I told her to discontinue danazol for now. I will hold off giving her Lasix.

## 2014-06-18 NOTE — Assessment & Plan Note (Signed)
This is likely anemia of chronic disease and her underlying disease. The patient denies recent history of bleeding such as epistaxis, hematuria or hematochezia. She is symptomatic from the anemia. We will observe for now.  She does not require transfusion now.  We establish transfusion threshold of hemoglobin at 8 g and she gets 2 units of blood each time.

## 2014-06-18 NOTE — Telephone Encounter (Signed)
Faxed new Rx for Jakafi to [facility] pt pharmacy.

## 2014-06-18 NOTE — Progress Notes (Signed)
[place] [facility] OFFICE PROGRESS NOTE  Patient Care Team: Mayra Neer, MD as PCP - General (Family Medicine) Heath Lark, MD as Consulting Physician (Hematology and Oncology)  SUMMARY OF ONCOLOGIC HISTORY: This is a very pleasant 80 year old lady who become transfusion dependent since 2014. She denies any bleeding such as epistaxis, hematuria, or hematochezia. 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 Erythropoietin stimulating agents were stopped when her erythropoietin level came back over 500 In October 2014, she was started on Revlimid however develop severe allergic reaction to Revlimid. That was discontinued. On 08/07/2013, we started her on Vidaza for 2 cycles. On 10/04/2013, repeat a bone marrow aspirate and biopsy showed persistent disease. Additional testing revealed that her disease is JAK 2 positive On 11/06/2013, Vidaza is resumed after her second opinion from [facility] On 02/08/2014, repeat bone marrow aspirate and biopsy showed no evidence of increased blasts On 04/16/2014, Vidaza is discontinued due to increased transfusion requirement and progressive weakness On 04/30/14: she was started on 100 mg danazol On 05/21/14: Danazol is increased to 200 mg daily. On 06/18/2014, danazol was discontinued. INTERVAL HISTORY: Please see below for problem oriented charting. She had improvement of energy level since recent blood transfusion. She complained of increasing leg swelling and bruising with danazol. She denies recent flare of psoriasis.  REVIEW OF SYSTEMS:   Constitutional: Denies fevers, chills or abnormal weight loss Eyes: Denies blurriness of vision Ears, nose, mouth, throat, and face: Denies mucositis or sore throat Respiratory: Denies cough, dyspnea or wheezes Cardiovascular: Denies palpitation, chest discomfort  Gastrointestinal:  Denies nausea, heartburn or  change in bowel habits Skin: Denies abnormal skin rashes Lymphatics: Denies new lymphadenopathy  Neurological:Denies numbness, tingling or new weaknesses Behavioral/Psych: Mood is stable, no new changes  All other systems were reviewed with the patient and are negative.  I have reviewed the past medical history, past surgical history, social history and family history with the patient and they are unchanged from previous note.  ALLERGIES:  is allergic to novocain; oxycodone; codeine; methimazole; and revlimid.  MEDICATIONS:  Current Outpatient Prescriptions  Medication Sig Dispense Refill  . acetaminophen (TYLENOL) 500 MG tablet Take 1,000 mg by mouth every 6 (six) hours as needed (Pain).       Marland Kitchen danazol (DANOCRINE) 100 MG capsule Take 2 capsules (200 mg total) by mouth daily.  90 capsule  3  . desoximetasone (TOPICORT) 0.25 % cream Apply 1 application topically 2 (two) times daily as needed. For rash      . hydrOXYzine (VISTARIL) 25 MG capsule Take 1 capsule (25 mg total) by mouth every 4 (four) hours as needed.  90 capsule  3  . insulin aspart (NOVOLOG) 100 UNIT/ML injection Inject 15 Units into the skin 3 (three) times daily before meals. For Blood sugar over 200      . insulin detemir (LEVEMIR) 100 UNIT/ML injection Inject 12 Units into the skin daily.       Marland Kitchen LORazepam (ATIVAN) 0.5 MG tablet Take 0.5 mg by mouth at bedtime.       . metFORMIN (GLUCOPHAGE) 500 MG tablet Take 1 tablet (500 mg total) by mouth 2 (two) times daily with a meal.  60 tablet  1  . metoprolol succinate (TOPROL-XL) 100 MG 24 hr tablet Take 50 mg by mouth every morning.       . mometasone (ELOCON) 0.1 % cream Apply 1 application topically  daily as needed. Rash      . mometasone (NASONEX) 50 MCG/ACT nasal spray Place 2 sprays into the nose 2 (two) times daily.      . Multiple Vitamin (MULTIVITAMIN) tablet Take 1 tablet by mouth every morning.       . nisoldipine (SULAR) 17 MG 24 hr tablet Take 17 mg by mouth daily.       Marland Kitchen olmesartan-hydrochlorothiazide (BENICAR HCT) 40-25 MG per tablet Take 1 tablet by mouth daily.      . predniSONE (DELTASONE) 5 MG tablet Take 15 mg by mouth daily with breakfast.       . traMADol (ULTRAM) 50 MG tablet       . triamcinolone cream (KENALOG) 0.1 %       . ruxolitinib phosphate (JAKAFI) 15 MG tablet Take 1 tablet (15 mg total) by mouth 2 (two) times daily.  60 tablet  3   No current facility-administered medications for this visit.   Facility-Administered Medications Ordered in Other Visits  Medication Dose Route Frequency Provider Last Rate Last Dose  . sodium chloride 0.9 % injection 3 mL  3 mL Intracatheter PRN Heath Lark, MD        PHYSICAL EXAMINATION: ECOG PERFORMANCE STATUS: 1 - Symptomatic but completely ambulatory  Filed Vitals:   06/18/14 0951  BP: 153/50  Pulse: 78  Temp: 98.4 F (36.9 C)  Resp: 17   Filed Weights   06/18/14 0951  Weight: 133 lb 4.8 oz (60.464 kg)    GENERAL:alert, no distress and comfortable SKIN: skin color is pale, texture, turgor are normal, no rashes or significant lesions, with significant bruising compared to prior visit  EYES: normal, Conjunctiva are pink and non-injected, sclera clear OROPHARYNX:no exudate, no erythema and lips, buccal mucosa, and tongue normal  NECK: supple, thyroid normal size, non-tender, without nodularity LYMPH:  no palpable lymphadenopathy in the cervical, axillary or inguinal LUNGS: clear to auscultation and percussion with normal breathing effort HEART: regular rate & rhythm and no murmurs and  with moderate bilateral  lower extremity edema ABDOMEN:abdomen soft, non-tender and normal bowel sounds. The tip of the spleen is palpable  Musculoskeletal:no cyanosis of digits and no clubbing  NEURO: alert & oriented x 3 with fluent speech, no focal motor/sensory deficits  LABORATORY DATA:  I have reviewed the data as listed    Component Value Date/Time   NA 134* 04/19/2014 1940   NA 137 04/16/2014  0852   K 4.7 04/19/2014 1940   K 4.0 04/16/2014 0852   CL 92* 04/19/2014 1940   CO2 25 04/19/2014 1940   CO2 25 04/16/2014 0852   GLUCOSE 398* 04/19/2014 1940   GLUCOSE 530* 04/16/2014 0852   BUN 42* 04/19/2014 1940   BUN 31.9* 04/16/2014 0852   CREATININE 0.80 04/19/2014 1940   CREATININE 1.1 04/16/2014 0852   CALCIUM 9.8 04/19/2014 1940   CALCIUM 9.7 04/16/2014 0852   PROT 6.6 04/19/2014 1940   PROT 6.5 04/16/2014 0852   ALBUMIN 3.0* 04/19/2014 1940   ALBUMIN 2.9* 04/16/2014 0852   AST 12 04/19/2014 1940   AST 9 04/16/2014 0852   ALT 20 04/19/2014 1940   ALT 24 04/16/2014 0852   ALKPHOS 60 04/19/2014 1940   ALKPHOS 56 04/16/2014 0852   BILITOT 0.6 04/19/2014 1940   BILITOT 0.75 04/16/2014 0852   GFRNONAA 65* 04/19/2014 1940   GFRAA 75* 04/19/2014 1940    No results found for this basename: SPEP,  UPEP,   kappa and lambda light chains  Lab Results  Component Value Date   WBC 5.8 06/18/2014   NEUTROABS 3.7 06/18/2014   HGB 9.7* 06/18/2014   HCT 29.3* 06/18/2014   MCV 83.2 06/18/2014   PLT 132* 06/18/2014      Chemistry      Component Value Date/Time   NA 134* 04/19/2014 1940   NA 137 04/16/2014 0852   K 4.7 04/19/2014 1940   K 4.0 04/16/2014 0852   CL 92* 04/19/2014 1940   CO2 25 04/19/2014 1940   CO2 25 04/16/2014 0852   BUN 42* 04/19/2014 1940   BUN 31.9* 04/16/2014 0852   CREATININE 0.80 04/19/2014 1940   CREATININE 1.1 04/16/2014 0852   GLU 199* 04/23/2014 0910      Component Value Date/Time   CALCIUM 9.8 04/19/2014 1940   CALCIUM 9.7 04/16/2014 0852   ALKPHOS 60 04/19/2014 1940   ALKPHOS 56 04/16/2014 0852   AST 12 04/19/2014 1940   AST 9 04/16/2014 0852   ALT 20 04/19/2014 1940   ALT 24 04/16/2014 0852   BILITOT 0.6 04/19/2014 1940   BILITOT 0.75 04/16/2014 0852      ASSESSMENT & PLAN:  MDS (myelodysplastic syndrome) with 5q deletion I have discussed with the patient the risks, benefits, side effects of Jakafi. I have attended recent conference that suggested this may help her with her  condition. The patient tested positive for JAK2 mutation early this year. Patient education handout is given. In the meantime, we will continue blood work monitoring on a weekly basis. I told the patient to discontinue taking danazol but to stay on the prednisone until further notice.  Psoriasis This is currently well-controlled with prednisone.    Thrombocytopenia, unspecified The cause is likely due to her underlying bone marrow disease. It is mild and there is little change compared from previous platelet count. The patient denies recent history of bleeding such as epistaxis, hematuria or hematochezia. She is asymptomatic from the thrombocytopenia. I will observe for now.  she does not require transfusion now.       Bilateral leg edema This is due to severe fluid retention from danazol. I told her to discontinue danazol for now. I will hold off giving her Lasix.  Anemia in neoplastic disease This is likely anemia of chronic disease and her underlying disease. The patient denies recent history of bleeding such as epistaxis, hematuria or hematochezia. She is symptomatic from the anemia. We will observe for now.  She does not require transfusion now.  We establish transfusion threshold of hemoglobin at 8 g and she gets 2 units of blood each time.          Orders Placed This Encounter  Procedures  . Comprehensive metabolic panel    Standing Status: Future     Number of Occurrences:      Standing Expiration Date: 07/23/2015  . Comprehensive metabolic panel    Standing Status: Standing     Number of Occurrences: 9     Standing Expiration Date: 06/19/2015   All questions were answered. The patient knows to call the clinic with any problems, questions or concerns. No barriers to learning was detected. I spent 30 minutes counseling the patient face to face. The total time spent in the appointment was 40 minutes and more than 50% was on counseling and review of test results      [facility], [place], MD 06/18/2014 11:07 AM

## 2014-06-18 NOTE — Assessment & Plan Note (Signed)
This is currently well-controlled with prednisone.

## 2014-06-18 NOTE — Assessment & Plan Note (Signed)
The cause is likely due to her underlying bone marrow disease. It is mild and there is little change compared from previous platelet count. The patient denies recent history of bleeding such as epistaxis, hematuria or hematochezia. She is asymptomatic from the thrombocytopenia. I will observe for now.  she does not require transfusion now.

## 2014-06-19 ENCOUNTER — Encounter: Payer: Self-pay | Admitting: *Deleted

## 2014-06-19 NOTE — Progress Notes (Signed)
Notified from [facility] pt pharmacy that Shanon Brow is approved through 12/17/14 and pt's co-pay is $6.60.   They are going to contact pt to pick up on Wed 9/16.

## 2014-06-20 ENCOUNTER — Telehealth: Payer: Self-pay | Admitting: *Deleted

## 2014-06-20 NOTE — Telephone Encounter (Signed)
Dau picked up new med,  Shanon Brow, at pharmacy today.  She states pt's sons want to take her out of town this weekend.   Pt will be back home on Sunday.  Dau asks if pt should wait until Sunday to start [facility] or if she should go ahead and start it today?

## 2014-06-20 NOTE — Telephone Encounter (Signed)
Called daughter back and she says she already called Dr. Alvy Bimler on her cell phone and was instructed ok to hold [place] until pt returns to town on Sunday.

## 2014-06-25 ENCOUNTER — Other Ambulatory Visit (HOSPITAL_BASED_OUTPATIENT_CLINIC_OR_DEPARTMENT_OTHER): Payer: Medicare Other

## 2014-06-25 ENCOUNTER — Other Ambulatory Visit: Payer: Self-pay

## 2014-06-25 ENCOUNTER — Telehealth: Payer: Self-pay | Admitting: *Deleted

## 2014-06-25 DIAGNOSIS — D46C Myelodysplastic syndrome with isolated del(5q) chromosomal abnormality: Secondary | ICD-10-CM

## 2014-06-25 DIAGNOSIS — D630 Anemia in neoplastic disease: Secondary | ICD-10-CM

## 2014-06-25 LAB — CBC WITH DIFFERENTIAL/PLATELET
BASO%: 2.3 % — ABNORMAL HIGH (ref 0.0–2.0)
BASOS ABS: 0.1 10*3/uL (ref 0.0–0.1)
EOS ABS: 0.5 10*3/uL (ref 0.0–0.5)
EOS%: 8.1 % — ABNORMAL HIGH (ref 0.0–7.0)
HCT: 26.3 % — ABNORMAL LOW (ref 34.8–46.6)
HEMOGLOBIN: 8.8 g/dL — AB (ref 11.6–15.9)
LYMPH%: 16.4 % (ref 14.0–49.7)
MCH: 27.3 pg (ref 25.1–34.0)
MCHC: 33.5 g/dL (ref 31.5–36.0)
MCV: 81.7 fL (ref 79.5–101.0)
MONO#: 0.4 10*3/uL (ref 0.1–0.9)
MONO%: 6.4 % (ref 0.0–14.0)
NEUT#: 4.1 10*3/uL (ref 1.5–6.5)
NEUT%: 66.8 % (ref 38.4–76.8)
PLATELETS: 159 10*3/uL (ref 145–400)
RBC: 3.22 10*6/uL — ABNORMAL LOW (ref 3.70–5.45)
RDW: 14.3 % (ref 11.2–14.5)
WBC: 6.1 10*3/uL (ref 3.9–10.3)
lymph#: 1 10*3/uL (ref 0.9–3.3)
nRBC: 0 % (ref 0–0)

## 2014-06-25 LAB — COMPREHENSIVE METABOLIC PANEL (CC13)
ALBUMIN: 3.3 g/dL — AB (ref 3.5–5.0)
ALK PHOS: 30 U/L — AB (ref 40–150)
ALT: 41 U/L (ref 0–55)
AST: 22 U/L (ref 5–34)
Anion Gap: 13 mEq/L — ABNORMAL HIGH (ref 3–11)
BUN: 32.3 mg/dL — AB (ref 7.0–26.0)
CO2: 27 mEq/L (ref 22–29)
CREATININE: 1.3 mg/dL — AB (ref 0.6–1.1)
Calcium: 9.6 mg/dL (ref 8.4–10.4)
Chloride: 103 mEq/L (ref 98–109)
Glucose: 120 mg/dl (ref 70–140)
POTASSIUM: 3 meq/L — AB (ref 3.5–5.1)
Sodium: 143 mEq/L (ref 136–145)
Total Bilirubin: 1.02 mg/dL (ref 0.20–1.20)
Total Protein: 6.8 g/dL (ref 6.4–8.3)

## 2014-06-25 LAB — HOLD TUBE, BLOOD BANK

## 2014-06-25 NOTE — Telephone Encounter (Signed)
Spoke with daughter about patient's low potassium. Dr Alvy Bimler wants patient to increase fluids, looks like she is dehydrated. Daughter states that patient has been nauseated over the weekend.

## 2014-06-27 ENCOUNTER — Other Ambulatory Visit (HOSPITAL_BASED_OUTPATIENT_CLINIC_OR_DEPARTMENT_OTHER): Payer: Medicare Other

## 2014-06-27 ENCOUNTER — Ambulatory Visit (HOSPITAL_BASED_OUTPATIENT_CLINIC_OR_DEPARTMENT_OTHER): Payer: Medicare Other | Admitting: Hematology and Oncology

## 2014-06-27 ENCOUNTER — Telehealth: Payer: Self-pay | Admitting: Hematology and Oncology

## 2014-06-27 ENCOUNTER — Encounter: Payer: Self-pay | Admitting: Hematology and Oncology

## 2014-06-27 ENCOUNTER — Telehealth: Payer: Self-pay | Admitting: *Deleted

## 2014-06-27 ENCOUNTER — Ambulatory Visit (HOSPITAL_BASED_OUTPATIENT_CLINIC_OR_DEPARTMENT_OTHER): Payer: Medicare Other

## 2014-06-27 VITALS — BP 148/49 | HR 66 | Temp 98.2°F | Resp 18 | Ht 60.0 in | Wt 124.9 lb

## 2014-06-27 DIAGNOSIS — R197 Diarrhea, unspecified: Secondary | ICD-10-CM

## 2014-06-27 DIAGNOSIS — D46C Myelodysplastic syndrome with isolated del(5q) chromosomal abnormality: Secondary | ICD-10-CM

## 2014-06-27 DIAGNOSIS — R112 Nausea with vomiting, unspecified: Secondary | ICD-10-CM

## 2014-06-27 DIAGNOSIS — R634 Abnormal weight loss: Secondary | ICD-10-CM

## 2014-06-27 DIAGNOSIS — D630 Anemia in neoplastic disease: Secondary | ICD-10-CM

## 2014-06-27 DIAGNOSIS — N179 Acute kidney failure, unspecified: Secondary | ICD-10-CM

## 2014-06-27 LAB — CBC WITH DIFFERENTIAL/PLATELET
BASO%: 1.7 % (ref 0.0–2.0)
BASOS ABS: 0.1 10*3/uL (ref 0.0–0.1)
EOS%: 5.6 % (ref 0.0–7.0)
Eosinophils Absolute: 0.2 10*3/uL (ref 0.0–0.5)
HEMATOCRIT: 25.1 % — AB (ref 34.8–46.6)
HEMOGLOBIN: 8.3 g/dL — AB (ref 11.6–15.9)
LYMPH%: 22.1 % (ref 14.0–49.7)
MCH: 27 pg (ref 25.1–34.0)
MCHC: 33 g/dL (ref 31.5–36.0)
MCV: 81.9 fL (ref 79.5–101.0)
MONO#: 0.1 10*3/uL (ref 0.1–0.9)
MONO%: 1.5 % (ref 0.0–14.0)
NEUT#: 2.6 10*3/uL (ref 1.5–6.5)
NEUT%: 69.1 % (ref 38.4–76.8)
Platelets: 169 10*3/uL (ref 145–400)
RBC: 3.06 10*6/uL — ABNORMAL LOW (ref 3.70–5.45)
RDW: 14.5 % (ref 11.2–14.5)
WBC: 3.7 10*3/uL — ABNORMAL LOW (ref 3.9–10.3)
lymph#: 0.8 10*3/uL — ABNORMAL LOW (ref 0.9–3.3)

## 2014-06-27 LAB — COMPREHENSIVE METABOLIC PANEL (CC13)
ALBUMIN: 3.3 g/dL — AB (ref 3.5–5.0)
ALK PHOS: 28 U/L — AB (ref 40–150)
ALT: 43 U/L (ref 0–55)
AST: 21 U/L (ref 5–34)
Anion Gap: 10 mEq/L (ref 3–11)
BILIRUBIN TOTAL: 0.83 mg/dL (ref 0.20–1.20)
BUN: 33.2 mg/dL — AB (ref 7.0–26.0)
CO2: 28 meq/L (ref 22–29)
Calcium: 9.7 mg/dL (ref 8.4–10.4)
Chloride: 106 mEq/L (ref 98–109)
Creatinine: 1.4 mg/dL — ABNORMAL HIGH (ref 0.6–1.1)
GLUCOSE: 120 mg/dL (ref 70–140)
POTASSIUM: 3.2 meq/L — AB (ref 3.5–5.1)
Sodium: 144 mEq/L (ref 136–145)
Total Protein: 6.6 g/dL (ref 6.4–8.3)

## 2014-06-27 LAB — TECHNOLOGIST REVIEW

## 2014-06-27 LAB — HOLD TUBE, BLOOD BANK

## 2014-06-27 MED ORDER — SODIUM CHLORIDE 0.9 % IJ SOLN
10.0000 mL | INTRAMUSCULAR | Status: DC | PRN
  Administered 2014-06-27: 10 mL
  Filled 2014-06-27: qty 10

## 2014-06-27 MED ORDER — SODIUM CHLORIDE 0.9 % IV SOLN
Freq: Once | INTRAVENOUS | Status: AC
  Administered 2014-06-27: 13:00:00 via INTRAVENOUS

## 2014-06-27 MED ORDER — HEPARIN SOD (PORK) LOCK FLUSH 100 UNIT/ML IV SOLN
500.0000 [IU] | Freq: Once | INTRAVENOUS | Status: AC | PRN
  Administered 2014-06-27: 500 [IU]
  Filled 2014-06-27: qty 5

## 2014-06-27 MED ORDER — ONDANSETRON 8 MG/50ML IVPB (CHCC)
8.0000 mg | Freq: Every day | INTRAVENOUS | Status: DC | PRN
  Administered 2014-06-27: 8 mg via INTRAVENOUS

## 2014-06-27 MED ORDER — ONDANSETRON 8 MG/NS 50 ML IVPB
INTRAVENOUS | Status: AC
  Filled 2014-06-27: qty 8

## 2014-06-27 NOTE — Patient Instructions (Signed)
Dehydration, Adult Dehydration is when you lose more fluids from the body than you take in. Vital organs like the kidneys, brain, and heart cannot function without a proper amount of fluids and salt. Any loss of fluids from the body can cause dehydration.  CAUSES   Vomiting.  Diarrhea.  Excessive sweating.  Excessive urine output.  Fever. SYMPTOMS  Mild dehydration  Thirst.  Dry lips.  Slightly dry mouth. Moderate dehydration  Very dry mouth.  Sunken eyes.  Skin does not bounce back quickly when lightly pinched and released.  Dark urine and decreased urine production.  Decreased tear production.  Headache. Severe dehydration  Very dry mouth.  Extreme thirst.  Rapid, weak pulse (more than 100 beats per minute at rest).  Cold hands and feet.  Not able to sweat in spite of heat and temperature.  Rapid breathing.  Blue lips.  Confusion and lethargy.  Difficulty being awakened.  Minimal urine production.  No tears. DIAGNOSIS  Your caregiver will diagnose dehydration based on your symptoms and your exam. Blood and urine tests will help confirm the diagnosis. The diagnostic evaluation should also identify the cause of dehydration. TREATMENT  Treatment of mild or moderate dehydration can often be done at home by increasing the amount of fluids that you drink. It is best to drink small amounts of fluid more often. Drinking too much at one time can make vomiting worse. Refer to the home care instructions below. Severe dehydration needs to be treated at the hospital where you will probably be given intravenous (IV) fluids that contain water and electrolytes. HOME CARE INSTRUCTIONS   Ask your caregiver about specific rehydration instructions.  Drink enough fluids to keep your urine clear or pale yellow.  Drink small amounts frequently if you have nausea and vomiting.  Eat as you normally do.  Avoid:  Foods or drinks high in sugar.  Carbonated  drinks.  Juice.  Extremely hot or cold fluids.  Drinks with caffeine.  Fatty, greasy foods.  Alcohol.  Tobacco.  Overeating.  Gelatin desserts.  Wash your hands well to avoid spreading bacteria and viruses.  Only take over-the-counter or prescription medicines for pain, discomfort, or fever as directed by your caregiver.  Ask your caregiver if you should continue all prescribed and over-the-counter medicines.  Keep all follow-up appointments with your caregiver. SEEK MEDICAL CARE IF:  You have abdominal pain and it increases or stays in one area (localizes).  You have a rash, stiff neck, or severe headache.  You are irritable, sleepy, or difficult to awaken.  You are weak, dizzy, or extremely thirsty. SEEK IMMEDIATE MEDICAL CARE IF:   You are unable to keep fluids down or you get worse despite treatment.  You have frequent episodes of vomiting or diarrhea.  You have blood or green matter (bile) in your vomit.  You have blood in your stool or your stool looks black and tarry.  You have not urinated in 6 to 8 hours, or you have only urinated a small amount of very dark urine.  You have a fever.  You faint. MAKE SURE YOU:   Understand these instructions.  Will watch your condition.  Will get help right away if you are not doing well or get worse. Document Released: 09/21/2005 Document Revised: 12/14/2011 Document Reviewed: 05/11/2011 ExitCare Patient Information 2015 [employer]. This information is not intended to replace advice given to you by your health care provider. Make sure you discuss any questions you have with your health care   provider.  

## 2014-06-27 NOTE — Telephone Encounter (Signed)
Per staff message from MD I have scheduled appts. Patient to receive appts today with AVS

## 2014-06-27 NOTE — Assessment & Plan Note (Signed)
The cause is unknown. I recommend IV fluids and daily IV anti-emetics. I recommend small frequent meals.

## 2014-06-27 NOTE — Telephone Encounter (Signed)
added pt appts  per staff...per staff pt aware...Marland Kitchenper Kenney Houseman add pt @ 1pm for ivf

## 2014-06-27 NOTE — Telephone Encounter (Signed)
Daughter reports pt nauseated,  Not able to drink much fluid,  Her skin is tenting and she worries pt is Dehydrated.  Dr. Alvy Bimler can see pt today w/ labs and IVFs.  Daughter verbalized understanding and will bring pt in at 10:45 am today.

## 2014-06-27 NOTE — Assessment & Plan Note (Signed)
The cause is unknown. I recommend cutting out daily products for the next few weeks.

## 2014-06-27 NOTE — Assessment & Plan Note (Signed)
This is due to intractable nausea, diarrhea and dehydration. I recommend daily IV fluids for now.

## 2014-06-27 NOTE — Assessment & Plan Note (Signed)
This is likely due to recent treatment. The patient denies recent history of bleeding such as epistaxis, hematuria or hematochezia. She is asymptomatic from the anemia. I will observe for now.  She does not require transfusion now. 

## 2014-06-27 NOTE — Assessment & Plan Note (Signed)
She has not experienced major side effects with Jakafi. The symptoms of nausea is likely related to other illness. She will continue at the same dose without adjustment.

## 2014-06-27 NOTE — Progress Notes (Signed)
[place] OFFICE PROGRESS NOTE  Patient Care Team: Mayra Neer, MD as PCP - General (Family Medicine) Heath Lark, MD as Consulting Physician (Hematology and Oncology)  SUMMARY OF ONCOLOGIC HISTORY: This is a very pleasant 80 year old lady who become transfusion dependent since 2014. She denies any bleeding such as epistaxis, hematuria, or hematochezia. 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 Erythropoietin stimulating agents were stopped when her erythropoietin level came back over 500 In October 2014, she was started on Revlimid however develop severe allergic reaction to Revlimid. That was discontinued. On 08/07/2013, we started her on Vidaza for 2 cycles. On 10/04/2013, repeat a bone marrow aspirate and biopsy showed persistent disease. Additional testing revealed that her disease is JAK 2 positive On 11/06/2013, Vidaza is resumed after her second opinion from [facility] On 02/08/2014, repeat bone marrow aspirate and biopsy showed no evidence of increased blasts On 04/16/2014, Vidaza is discontinued due to increased transfusion requirement and progressive weakness On 04/30/14: she was started on 100 mg danazol On 05/21/14: Danazol is increased to 200 mg daily. On 06/18/2014, danazol was discontinued. On 06/25/14: Shanon Brow is added INTERVAL HISTORY: Please see below for problem oriented charting. She is seen urgently today because of intractable nausea, vomiting and diarrhea. She has lost 9 pounds since I saw her. She is very poor appetite. The symptoms started before the start of chemotherapy.  REVIEW OF SYSTEMS:   Constitutional: Denies fevers, chills  Eyes: Denies blurriness of vision Ears, nose, mouth, throat, and face: Denies mucositis or sore throat Respiratory: Denies cough, dyspnea or wheezes Cardiovascular: Denies palpitation, chest discomfort or lower  extremity swelling Skin: Denies abnormal skin rashes Lymphatics: Denies new lymphadenopathy or easy bruising Neurological:Denies numbness, tingling or new weaknesses Behavioral/Psych: Mood is stable, no new changes  All other systems were reviewed with the patient and are negative.  I have reviewed the past medical history, past surgical history, social history and family history with the patient and they are unchanged from previous note.  ALLERGIES:  is allergic to novocain; oxycodone; codeine; methimazole; and revlimid.  MEDICATIONS:  Current Outpatient Prescriptions  Medication Sig Dispense Refill  . acetaminophen (TYLENOL) 500 MG tablet Take 1,000 mg by mouth every 6 (six) hours as needed (Pain).       Marland Kitchen desoximetasone (TOPICORT) 0.25 % cream Apply 1 application topically 2 (two) times daily as needed. For rash      . hydrOXYzine (VISTARIL) 25 MG capsule Take 1 capsule (25 mg total) by mouth every 4 (four) hours as needed.  90 capsule  3  . insulin aspart (NOVOLOG) 100 UNIT/ML injection Inject 15 Units into the skin 3 (three) times daily before meals. For Blood sugar over 200      . insulin detemir (LEVEMIR) 100 UNIT/ML injection Inject 12 Units into the skin daily.       Marland Kitchen LORazepam (ATIVAN) 0.5 MG tablet Take 0.5 mg by mouth at bedtime.       . metFORMIN (GLUCOPHAGE) 500 MG tablet Take 1 tablet (500 mg total) by mouth 2 (two) times daily with a meal.  60 tablet  1  . metoprolol succinate (TOPROL-XL) 100 MG 24 hr tablet Take 50 mg by mouth every morning.       . mometasone (ELOCON) 0.1 % cream Apply 1 application topically daily as needed. Rash      . mometasone (NASONEX) 50 MCG/ACT nasal spray  Place 2 sprays into the nose 2 (two) times daily.      . Multiple Vitamin (MULTIVITAMIN) tablet Take 1 tablet by mouth every morning.       . nisoldipine (SULAR) 17 MG 24 hr tablet Take 17 mg by mouth daily.      Marland Kitchen olmesartan-hydrochlorothiazide (BENICAR HCT) 40-25 MG per tablet Take 1 tablet by  mouth daily.      . predniSONE (DELTASONE) 5 MG tablet Take 15 mg by mouth daily with breakfast.       . ruxolitinib phosphate (JAKAFI) 15 MG tablet Take 1 tablet (15 mg total) by mouth 2 (two) times daily.  60 tablet  3  . traMADol (ULTRAM) 50 MG tablet       . triamcinolone cream (KENALOG) 0.1 %        No current facility-administered medications for this visit.   Facility-Administered Medications Ordered in Other Visits  Medication Dose Route Frequency Provider Last Rate Last Dose  . heparin lock flush 100 unit/mL  500 Units Intracatheter Once PRN Heath Lark, MD      . ondansetron (ZOFRAN) IVPB 8 mg  8 mg Intravenous Daily PRN Heath Lark, MD   8 mg at 06/27/14 1238  . sodium chloride 0.9 % injection 10 mL  10 mL Intracatheter PRN Heath Lark, MD      . sodium chloride 0.9 % injection 3 mL  3 mL Intracatheter PRN Heath Lark, MD        PHYSICAL EXAMINATION: ECOG PERFORMANCE STATUS: 1 - Symptomatic but completely ambulatory  Filed Vitals:   06/27/14 1104  BP: 148/49  Pulse: 66  Temp: 98.2 F (36.8 C)  Resp: 18   Filed Weights   06/27/14 1104  Weight: 124 lb 14.4 oz (56.654 kg)    GENERAL:alert, no distress and comfortable SKIN: skin color, texture, turgor are normal, no rashes or significant lesions EYES: normal, Conjunctiva are pale and non-injected, sclera clear OROPHARYNX:no exudate, no erythema and lips, buccal mucosa, and tongue normal . Dry mucous membrane is noted NECK: supple, thyroid normal size, non-tender, without nodularity LYMPH:  no palpable lymphadenopathy in the cervical, axillary or inguinal LUNGS: clear to auscultation and percussion with normal breathing effort HEART: regular rate & rhythm and no murmurs and no lower extremity edema ABDOMEN:abdomen soft, non-tender and normal bowel sounds Musculoskeletal:no cyanosis of digits and no clubbing  NEURO: alert & oriented x 3 with fluent speech, no focal motor/sensory deficits  LABORATORY DATA:  I have  reviewed the data as listed    Component Value Date/Time   NA 144 06/27/2014 1040   NA 134* 04/19/2014 1940   K 3.2* 06/27/2014 1040   K 4.7 04/19/2014 1940   CL 92* 04/19/2014 1940   CO2 28 06/27/2014 1040   CO2 25 04/19/2014 1940   GLUCOSE 120 06/27/2014 1040   GLUCOSE 398* 04/19/2014 1940   BUN 33.2* 06/27/2014 1040   BUN 42* 04/19/2014 1940   CREATININE 1.4* 06/27/2014 1040   CREATININE 0.80 04/19/2014 1940   CALCIUM 9.7 06/27/2014 1040   CALCIUM 9.8 04/19/2014 1940   PROT 6.6 06/27/2014 1040   PROT 6.6 04/19/2014 1940   ALBUMIN 3.3* 06/27/2014 1040   ALBUMIN 3.0* 04/19/2014 1940   AST 21 06/27/2014 1040   AST 12 04/19/2014 1940   ALT 43 06/27/2014 1040   ALT 20 04/19/2014 1940   ALKPHOS 28* 06/27/2014 1040   ALKPHOS 60 04/19/2014 1940   BILITOT 0.83 06/27/2014 1040   BILITOT 0.6 04/19/2014 1940  GFRNONAA 65* 04/19/2014 1940   GFRAA 75* 04/19/2014 1940    No results found for this basename: SPEP,  UPEP,   kappa and lambda light chains    Lab Results  Component Value Date   WBC 3.7* 06/27/2014   NEUTROABS 2.6 06/27/2014   HGB 8.3* 06/27/2014   HCT 25.1* 06/27/2014   MCV 81.9 06/27/2014   PLT 169 06/27/2014      Chemistry      Component Value Date/Time   NA 144 06/27/2014 1040   NA 134* 04/19/2014 1940   K 3.2* 06/27/2014 1040   K 4.7 04/19/2014 1940   CL 92* 04/19/2014 1940   CO2 28 06/27/2014 1040   CO2 25 04/19/2014 1940   BUN 33.2* 06/27/2014 1040   BUN 42* 04/19/2014 1940   CREATININE 1.4* 06/27/2014 1040   CREATININE 0.80 04/19/2014 1940   GLU 199* 04/23/2014 0910      Component Value Date/Time   CALCIUM 9.7 06/27/2014 1040   CALCIUM 9.8 04/19/2014 1940   ALKPHOS 28* 06/27/2014 1040   ALKPHOS 60 04/19/2014 1940   AST 21 06/27/2014 1040   AST 12 04/19/2014 1940   ALT 43 06/27/2014 1040   ALT 20 04/19/2014 1940   BILITOT 0.83 06/27/2014 1040   BILITOT 0.6 04/19/2014 1940      ASSESSMENT & PLAN:  MDS (myelodysplastic syndrome) with 5q deletion She has not experienced major side effects  with Jakafi. The symptoms of nausea is likely related to other illness. She will continue at the same dose without adjustment.  Moderate nausea and vomiting The cause is unknown. I recommend IV fluids and daily IV anti-emetics. I recommend small frequent meals.  Diarrhea The cause is unknown. I recommend cutting out daily products for the next few weeks.  Anemia in neoplastic disease This is likely due to recent treatment. The patient denies recent history of bleeding such as epistaxis, hematuria or hematochezia. She is asymptomatic from the anemia. I will observe for now.  She does not require transfusion now.   Acute renal failure This is due to intractable nausea, diarrhea and dehydration. I recommend daily IV fluids for now.    No orders of the defined types were placed in this encounter.   All questions were answered. The patient knows to call the clinic with any problems, questions or concerns. No barriers to learning was detected. I spent 30 minutes counseling the patient face to face. The total time spent in the appointment was 40 minutes and more than 50% was on counseling and review of test results     [facility], [place], MD 06/27/2014 1:15 PM

## 2014-06-28 ENCOUNTER — Ambulatory Visit (HOSPITAL_BASED_OUTPATIENT_CLINIC_OR_DEPARTMENT_OTHER): Payer: Medicare Other

## 2014-06-28 VITALS — BP 124/50 | HR 65 | Temp 97.8°F

## 2014-06-28 DIAGNOSIS — R112 Nausea with vomiting, unspecified: Secondary | ICD-10-CM

## 2014-06-28 DIAGNOSIS — D46C Myelodysplastic syndrome with isolated del(5q) chromosomal abnormality: Secondary | ICD-10-CM

## 2014-06-28 MED ORDER — ONDANSETRON 8 MG/NS 50 ML IVPB
INTRAVENOUS | Status: AC
  Filled 2014-06-28: qty 8

## 2014-06-28 MED ORDER — SODIUM CHLORIDE 0.9 % IJ SOLN
10.0000 mL | INTRAMUSCULAR | Status: DC | PRN
  Administered 2014-06-28: 10 mL
  Filled 2014-06-28: qty 10

## 2014-06-28 MED ORDER — SODIUM CHLORIDE 0.9 % IV SOLN
Freq: Once | INTRAVENOUS | Status: AC
  Administered 2014-06-28: 16:00:00 via INTRAVENOUS

## 2014-06-28 MED ORDER — HEPARIN SOD (PORK) LOCK FLUSH 100 UNIT/ML IV SOLN
500.0000 [IU] | Freq: Once | INTRAVENOUS | Status: AC | PRN
  Administered 2014-06-28: 500 [IU]
  Filled 2014-06-28: qty 5

## 2014-06-28 MED ORDER — ONDANSETRON 8 MG/50ML IVPB (CHCC)
8.0000 mg | Freq: Every day | INTRAVENOUS | Status: DC | PRN
  Administered 2014-06-28: 8 mg via INTRAVENOUS

## 2014-06-28 NOTE — Patient Instructions (Signed)
Dehydration, Adult Dehydration is when you lose more fluids from the body than you take in. Vital organs like the kidneys, brain, and heart cannot function without a proper amount of fluids and salt. Any loss of fluids from the body can cause dehydration.  CAUSES   Vomiting.  Diarrhea.  Excessive sweating.  Excessive urine output.  Fever. SYMPTOMS  Mild dehydration  Thirst.  Dry lips.  Slightly dry mouth. Moderate dehydration  Very dry mouth.  Sunken eyes.  Skin does not bounce back quickly when lightly pinched and released.  Dark urine and decreased urine production.  Decreased tear production.  Headache. Severe dehydration  Very dry mouth.  Extreme thirst.  Rapid, weak pulse (more than 100 beats per minute at rest).  Cold hands and feet.  Not able to sweat in spite of heat and temperature.  Rapid breathing.  Blue lips.  Confusion and lethargy.  Difficulty being awakened.  Minimal urine production.  No tears. DIAGNOSIS  Your caregiver will diagnose dehydration based on your symptoms and your exam. Blood and urine tests will help confirm the diagnosis. The diagnostic evaluation should also identify the cause of dehydration. TREATMENT  Treatment of mild or moderate dehydration can often be done at home by increasing the amount of fluids that you drink. It is best to drink small amounts of fluid more often. Drinking too much at one time can make vomiting worse. Refer to the home care instructions below. Severe dehydration needs to be treated at the hospital where you will probably be given intravenous (IV) fluids that contain water and electrolytes. HOME CARE INSTRUCTIONS   Ask your caregiver about specific rehydration instructions.  Drink enough fluids to keep your urine clear or pale yellow.  Drink small amounts frequently if you have nausea and vomiting.  Eat as you normally do.  Avoid:  Foods or drinks high in sugar.  Carbonated  drinks.  Juice.  Extremely hot or cold fluids.  Drinks with caffeine.  Fatty, greasy foods.  Alcohol.  Tobacco.  Overeating.  Gelatin desserts.  Wash your hands well to avoid spreading bacteria and viruses.  Only take over-the-counter or prescription medicines for pain, discomfort, or fever as directed by your caregiver.  Ask your caregiver if you should continue all prescribed and over-the-counter medicines.  Keep all follow-up appointments with your caregiver. SEEK MEDICAL CARE IF:  You have abdominal pain and it increases or stays in one area (localizes).  You have a rash, stiff neck, or severe headache.  You are irritable, sleepy, or difficult to awaken.  You are weak, dizzy, or extremely thirsty. SEEK IMMEDIATE MEDICAL CARE IF:   You are unable to keep fluids down or you get worse despite treatment.  You have frequent episodes of vomiting or diarrhea.  You have blood or green matter (bile) in your vomit.  You have blood in your stool or your stool looks black and tarry.  You have not urinated in 6 to 8 hours, or you have only urinated a small amount of very dark urine.  You have a fever.  You faint. MAKE SURE YOU:   Understand these instructions.  Will watch your condition.  Will get help right away if you are not doing well or get worse. Document Released: 09/21/2005 Document Revised: 12/14/2011 Document Reviewed: 05/11/2011 ExitCare Patient Information 2015 [employer]. This information is not intended to replace advice given to you by your health care provider. Make sure you discuss any questions you have with your health care   provider.  

## 2014-06-29 ENCOUNTER — Ambulatory Visit (HOSPITAL_BASED_OUTPATIENT_CLINIC_OR_DEPARTMENT_OTHER): Payer: Medicare Other

## 2014-06-29 VITALS — BP 144/40 | HR 69 | Temp 98.4°F

## 2014-06-29 DIAGNOSIS — N179 Acute kidney failure, unspecified: Secondary | ICD-10-CM

## 2014-06-29 DIAGNOSIS — R112 Nausea with vomiting, unspecified: Secondary | ICD-10-CM

## 2014-06-29 DIAGNOSIS — R197 Diarrhea, unspecified: Secondary | ICD-10-CM

## 2014-06-29 MED ORDER — HEPARIN SOD (PORK) LOCK FLUSH 100 UNIT/ML IV SOLN
500.0000 [IU] | Freq: Once | INTRAVENOUS | Status: AC | PRN
  Administered 2014-06-29: 500 [IU]
  Filled 2014-06-29: qty 5

## 2014-06-29 MED ORDER — ONDANSETRON 8 MG/50ML IVPB (CHCC)
8.0000 mg | Freq: Every day | INTRAVENOUS | Status: DC | PRN
  Administered 2014-06-29: 8 mg via INTRAVENOUS

## 2014-06-29 MED ORDER — ONDANSETRON 8 MG/NS 50 ML IVPB
INTRAVENOUS | Status: AC
  Filled 2014-06-29: qty 8

## 2014-06-29 MED ORDER — SODIUM CHLORIDE 0.9 % IV SOLN
1000.0000 mL | Freq: Once | INTRAVENOUS | Status: AC
  Administered 2014-06-29: 1000 mL via INTRAVENOUS

## 2014-06-29 MED ORDER — SODIUM CHLORIDE 0.9 % IJ SOLN
10.0000 mL | INTRAMUSCULAR | Status: DC | PRN
  Administered 2014-06-29: 10 mL
  Filled 2014-06-29: qty 10

## 2014-06-29 NOTE — Patient Instructions (Signed)
Dehydration, Adult Dehydration is when you lose more fluids from the body than you take in. Vital organs like the kidneys, brain, and heart cannot function without a proper amount of fluids and salt. Any loss of fluids from the body can cause dehydration.  CAUSES   Vomiting.  Diarrhea.  Excessive sweating.  Excessive urine output.  Fever. SYMPTOMS  Mild dehydration  Thirst.  Dry lips.  Slightly dry mouth. Moderate dehydration  Very dry mouth.  Sunken eyes.  Skin does not bounce back quickly when lightly pinched and released.  Dark urine and decreased urine production.  Decreased tear production.  Headache. Severe dehydration  Very dry mouth.  Extreme thirst.  Rapid, weak pulse (more than 100 beats per minute at rest).  Cold hands and feet.  Not able to sweat in spite of heat and temperature.  Rapid breathing.  Blue lips.  Confusion and lethargy.  Difficulty being awakened.  Minimal urine production.  No tears. DIAGNOSIS  Your caregiver will diagnose dehydration based on your symptoms and your exam. Blood and urine tests will help confirm the diagnosis. The diagnostic evaluation should also identify the cause of dehydration. TREATMENT  Treatment of mild or moderate dehydration can often be done at home by increasing the amount of fluids that you drink. It is best to drink small amounts of fluid more often. Drinking too much at one time can make vomiting worse. Refer to the home care instructions below. Severe dehydration needs to be treated at the hospital where you will probably be given intravenous (IV) fluids that contain water and electrolytes. HOME CARE INSTRUCTIONS   Ask your caregiver about specific rehydration instructions.  Drink enough fluids to keep your urine clear or pale yellow.  Drink small amounts frequently if you have nausea and vomiting.  Eat as you normally do.  Avoid:  Foods or drinks high in sugar.  Carbonated  drinks.  Juice.  Extremely hot or cold fluids.  Drinks with caffeine.  Fatty, greasy foods.  Alcohol.  Tobacco.  Overeating.  Gelatin desserts.  Wash your hands well to avoid spreading bacteria and viruses.  Only take over-the-counter or prescription medicines for pain, discomfort, or fever as directed by your caregiver.  Ask your caregiver if you should continue all prescribed and over-the-counter medicines.  Keep all follow-up appointments with your caregiver. SEEK MEDICAL CARE IF:  You have abdominal pain and it increases or stays in one area (localizes).  You have a rash, stiff neck, or severe headache.  You are irritable, sleepy, or difficult to awaken.  You are weak, dizzy, or extremely thirsty. SEEK IMMEDIATE MEDICAL CARE IF:   You are unable to keep fluids down or you get worse despite treatment.  You have frequent episodes of vomiting or diarrhea.  You have blood or green matter (bile) in your vomit.  You have blood in your stool or your stool looks black and tarry.  You have not urinated in 6 to 8 hours, or you have only urinated a small amount of very dark urine.  You have a fever.  You faint. MAKE SURE YOU:   Understand these instructions.  Will watch your condition.  Will get help right away if you are not doing well or get worse. Document Released: 09/21/2005 Document Revised: 12/14/2011 Document Reviewed: 05/11/2011 ExitCare Patient Information 2015 [employer]. This information is not intended to replace advice given to you by your health care provider. Make sure you discuss any questions you have with your health care   provider.  

## 2014-06-30 ENCOUNTER — Ambulatory Visit (HOSPITAL_BASED_OUTPATIENT_CLINIC_OR_DEPARTMENT_OTHER): Payer: Medicare Other

## 2014-06-30 VITALS — BP 122/40 | HR 68 | Temp 97.6°F | Resp 18

## 2014-06-30 DIAGNOSIS — N179 Acute kidney failure, unspecified: Secondary | ICD-10-CM

## 2014-06-30 DIAGNOSIS — D46C Myelodysplastic syndrome with isolated del(5q) chromosomal abnormality: Secondary | ICD-10-CM

## 2014-06-30 DIAGNOSIS — R112 Nausea with vomiting, unspecified: Secondary | ICD-10-CM

## 2014-06-30 MED ORDER — SODIUM CHLORIDE 0.9 % IV SOLN
Freq: Once | INTRAVENOUS | Status: AC
  Administered 2014-06-30: 10:00:00 via INTRAVENOUS

## 2014-06-30 MED ORDER — SODIUM CHLORIDE 0.9 % IJ SOLN
10.0000 mL | INTRAMUSCULAR | Status: DC | PRN
  Administered 2014-06-30: 10 mL
  Filled 2014-06-30: qty 10

## 2014-06-30 MED ORDER — HEPARIN SOD (PORK) LOCK FLUSH 100 UNIT/ML IV SOLN
500.0000 [IU] | Freq: Once | INTRAVENOUS | Status: AC | PRN
  Administered 2014-06-30: 500 [IU]
  Filled 2014-06-30: qty 5

## 2014-06-30 NOTE — Patient Instructions (Signed)
Dehydration, Adult Dehydration is when you lose more fluids from the body than you take in. Vital organs like the kidneys, brain, and heart cannot function without a proper amount of fluids and salt. Any loss of fluids from the body can cause dehydration.  CAUSES   Vomiting.  Diarrhea.  Excessive sweating.  Excessive urine output.  Fever. SYMPTOMS  Mild dehydration  Thirst.  Dry lips.  Slightly dry mouth. Moderate dehydration  Very dry mouth.  Sunken eyes.  Skin does not bounce back quickly when lightly pinched and released.  Dark urine and decreased urine production.  Decreased tear production.  Headache. Severe dehydration  Very dry mouth.  Extreme thirst.  Rapid, weak pulse (more than 100 beats per minute at rest).  Cold hands and feet.  Not able to sweat in spite of heat and temperature.  Rapid breathing.  Blue lips.  Confusion and lethargy.  Difficulty being awakened.  Minimal urine production.  No tears. DIAGNOSIS  Your caregiver will diagnose dehydration based on your symptoms and your exam. Blood and urine tests will help confirm the diagnosis. The diagnostic evaluation should also identify the cause of dehydration. TREATMENT  Treatment of mild or moderate dehydration can often be done at home by increasing the amount of fluids that you drink. It is best to drink small amounts of fluid more often. Drinking too much at one time can make vomiting worse. Refer to the home care instructions below. Severe dehydration needs to be treated at the hospital where you will probably be given intravenous (IV) fluids that contain water and electrolytes. HOME CARE INSTRUCTIONS   Ask your caregiver about specific rehydration instructions.  Drink enough fluids to keep your urine clear or pale yellow.  Drink small amounts frequently if you have nausea and vomiting.  Eat as you normally do.  Avoid:  Foods or drinks high in sugar.  Carbonated  drinks.  Juice.  Extremely hot or cold fluids.  Drinks with caffeine.  Fatty, greasy foods.  Alcohol.  Tobacco.  Overeating.  Gelatin desserts.  Wash your hands well to avoid spreading bacteria and viruses.  Only take over-the-counter or prescription medicines for pain, discomfort, or fever as directed by your caregiver.  Ask your caregiver if you should continue all prescribed and over-the-counter medicines.  Keep all follow-up appointments with your caregiver. SEEK MEDICAL CARE IF:  You have abdominal pain and it increases or stays in one area (localizes).  You have a rash, stiff neck, or severe headache.  You are irritable, sleepy, or difficult to awaken.  You are weak, dizzy, or extremely thirsty. SEEK IMMEDIATE MEDICAL CARE IF:   You are unable to keep fluids down or you get worse despite treatment.  You have frequent episodes of vomiting or diarrhea.  You have blood or green matter (bile) in your vomit.  You have blood in your stool or your stool looks black and tarry.  You have not urinated in 6 to 8 hours, or you have only urinated a small amount of very dark urine.  You have a fever.  You faint. MAKE SURE YOU:   Understand these instructions.  Will watch your condition.  Will get help right away if you are not doing well or get worse. Document Released: 09/21/2005 Document Revised: 12/14/2011 Document Reviewed: 05/11/2011 ExitCare Patient Information 2015 [employer]. This information is not intended to replace advice given to you by your health care provider. Make sure you discuss any questions you have with your health care   provider.  

## 2014-07-02 ENCOUNTER — Other Ambulatory Visit: Payer: Self-pay | Admitting: *Deleted

## 2014-07-02 ENCOUNTER — Other Ambulatory Visit (HOSPITAL_BASED_OUTPATIENT_CLINIC_OR_DEPARTMENT_OTHER): Payer: Medicare Other

## 2014-07-02 ENCOUNTER — Ambulatory Visit (HOSPITAL_BASED_OUTPATIENT_CLINIC_OR_DEPARTMENT_OTHER): Payer: Medicare Other

## 2014-07-02 ENCOUNTER — Telehealth: Payer: Self-pay | Admitting: Dietician

## 2014-07-02 ENCOUNTER — Other Ambulatory Visit: Payer: Self-pay | Admitting: Hematology and Oncology

## 2014-07-02 VITALS — BP 146/36 | HR 64 | Temp 98.3°F | Resp 20

## 2014-07-02 DIAGNOSIS — D630 Anemia in neoplastic disease: Secondary | ICD-10-CM | POA: Diagnosis not present

## 2014-07-02 DIAGNOSIS — D46C Myelodysplastic syndrome with isolated del(5q) chromosomal abnormality: Secondary | ICD-10-CM

## 2014-07-02 LAB — HOLD TUBE, BLOOD BANK

## 2014-07-02 LAB — CBC WITH DIFFERENTIAL/PLATELET
BASO%: 0.3 % (ref 0.0–2.0)
BASOS ABS: 0 10*3/uL (ref 0.0–0.1)
EOS%: 4.1 % (ref 0.0–7.0)
Eosinophils Absolute: 0.1 10*3/uL (ref 0.0–0.5)
HEMATOCRIT: 20 % — AB (ref 34.8–46.6)
HEMOGLOBIN: 6.8 g/dL — AB (ref 11.6–15.9)
LYMPH%: 22.3 % (ref 14.0–49.7)
MCH: 27 pg (ref 25.1–34.0)
MCHC: 34 g/dL (ref 31.5–36.0)
MCV: 79.4 fL — ABNORMAL LOW (ref 79.5–101.0)
MONO#: 0.1 10*3/uL (ref 0.1–0.9)
MONO%: 4.1 % (ref 0.0–14.0)
NEUT#: 2.4 10*3/uL (ref 1.5–6.5)
NEUT%: 69.2 % (ref 38.4–76.8)
PLATELETS: 101 10*3/uL — AB (ref 145–400)
RBC: 2.52 10*6/uL — ABNORMAL LOW (ref 3.70–5.45)
RDW: 13.3 % (ref 11.2–14.5)
WBC: 3.5 10*3/uL — AB (ref 3.9–10.3)
lymph#: 0.8 10*3/uL — ABNORMAL LOW (ref 0.9–3.3)
nRBC: 0 % (ref 0–0)

## 2014-07-02 LAB — COMPREHENSIVE METABOLIC PANEL (CC13)
ALT: 43 U/L (ref 0–55)
AST: 19 U/L (ref 5–34)
Albumin: 3.3 g/dL — ABNORMAL LOW (ref 3.5–5.0)
Alkaline Phosphatase: 25 U/L — ABNORMAL LOW (ref 40–150)
Anion Gap: 9 mEq/L (ref 3–11)
BUN: 22.2 mg/dL (ref 7.0–26.0)
CALCIUM: 9.4 mg/dL (ref 8.4–10.4)
CHLORIDE: 107 meq/L (ref 98–109)
CO2: 29 mEq/L (ref 22–29)
Creatinine: 1.3 mg/dL — ABNORMAL HIGH (ref 0.6–1.1)
Glucose: 121 mg/dl (ref 70–140)
POTASSIUM: 2.9 meq/L — AB (ref 3.5–5.1)
SODIUM: 145 meq/L (ref 136–145)
Total Bilirubin: 0.57 mg/dL (ref 0.20–1.20)
Total Protein: 6 g/dL — ABNORMAL LOW (ref 6.4–8.3)

## 2014-07-02 LAB — TECHNOLOGIST REVIEW

## 2014-07-02 LAB — PREPARE RBC (CROSSMATCH)

## 2014-07-02 MED ORDER — HEPARIN SOD (PORK) LOCK FLUSH 100 UNIT/ML IV SOLN
500.0000 [IU] | Freq: Every day | INTRAVENOUS | Status: AC | PRN
  Administered 2014-07-02: 500 [IU]
  Filled 2014-07-02: qty 5

## 2014-07-02 MED ORDER — DIPHENHYDRAMINE HCL 25 MG PO CAPS
ORAL_CAPSULE | ORAL | Status: AC
  Filled 2014-07-02: qty 1

## 2014-07-02 MED ORDER — SODIUM CHLORIDE 0.9 % IV SOLN
250.0000 mL | Freq: Once | INTRAVENOUS | Status: AC
  Administered 2014-07-02: 250 mL via INTRAVENOUS

## 2014-07-02 MED ORDER — DIPHENHYDRAMINE HCL 25 MG PO CAPS
25.0000 mg | ORAL_CAPSULE | Freq: Once | ORAL | Status: AC
  Administered 2014-07-02: 25 mg via ORAL

## 2014-07-02 MED ORDER — SODIUM CHLORIDE 0.9 % IJ SOLN
10.0000 mL | INTRAMUSCULAR | Status: AC | PRN
  Administered 2014-07-02: 10 mL
  Filled 2014-07-02: qty 10

## 2014-07-02 MED ORDER — FUROSEMIDE 10 MG/ML IJ SOLN
20.0000 mg | Freq: Once | INTRAMUSCULAR | Status: AC
  Administered 2014-07-02: 20 mg via INTRAVENOUS

## 2014-07-02 MED ORDER — ACETAMINOPHEN 325 MG PO TABS
ORAL_TABLET | ORAL | Status: AC
  Filled 2014-07-02: qty 2

## 2014-07-02 MED ORDER — ACETAMINOPHEN 325 MG PO TABS
650.0000 mg | ORAL_TABLET | Freq: Once | ORAL | Status: AC
Start: 2014-07-02 — End: 2014-07-02
  Administered 2014-07-02: 650 mg via ORAL

## 2014-07-02 MED ORDER — POTASSIUM CHLORIDE CRYS ER 20 MEQ PO TBCR: 20.0000 meq | EXTENDED_RELEASE_TABLET | Freq: Two times a day (BID) | ORAL | Status: DC

## 2014-07-02 NOTE — Patient Instructions (Signed)

## 2014-07-02 NOTE — Telephone Encounter (Signed)
Brief Outpatient Oncology Nutrition Note  Patient has been identified to be at risk on malnutrition screen.  Wt Readings from Last 10 Encounters:  06/27/14 124 lb 14.4 oz (56.654 kg)  06/18/14 133 lb 4.8 oz (60.464 kg)  05/21/14 132 lb 12.8 oz (60.238 kg)  04/23/14 133 lb 14.4 oz (60.737 kg)  04/16/14 131 lb 12.8 oz (59.784 kg)  03/19/14 137 lb 11.2 oz (62.46 kg)  02/19/14 139 lb 1.6 oz (63.095 kg)  02/08/14 135 lb (61.236 kg)  01/15/14 140 lb 8 oz (63.73 kg)  12/11/13 140 lb 1.6 oz (63.549 kg)      Dx:  MDS (myelodysplastic syndrome.  Patient of Dr. Christiana Pellant.  Called patient due to weight loss.  She is having side effects of medications with N/V and has been coming in for IVF.  Patient was not available with no answering service.  [place] RD available as needed.  Antonieta Iba, RD, LDN

## 2014-07-03 LAB — TYPE AND SCREEN
ABO/RH(D): A POS
Antibody Screen: NEGATIVE
UNIT DIVISION: 0
Unit division: 0

## 2014-07-09 ENCOUNTER — Other Ambulatory Visit (HOSPITAL_BASED_OUTPATIENT_CLINIC_OR_DEPARTMENT_OTHER): Payer: Medicare Other

## 2014-07-09 ENCOUNTER — Telehealth: Payer: Self-pay | Admitting: *Deleted

## 2014-07-09 DIAGNOSIS — D630 Anemia in neoplastic disease: Secondary | ICD-10-CM

## 2014-07-09 DIAGNOSIS — D46C Myelodysplastic syndrome with isolated del(5q) chromosomal abnormality: Secondary | ICD-10-CM

## 2014-07-09 LAB — COMPREHENSIVE METABOLIC PANEL (CC13)
ALK PHOS: 30 U/L — AB (ref 40–150)
ALT: 42 U/L (ref 0–55)
AST: 18 U/L (ref 5–34)
Albumin: 3.6 g/dL (ref 3.5–5.0)
Anion Gap: 10 mEq/L (ref 3–11)
BUN: 26.8 mg/dL — AB (ref 7.0–26.0)
CALCIUM: 9.8 mg/dL (ref 8.4–10.4)
CHLORIDE: 107 meq/L (ref 98–109)
CO2: 27 mEq/L (ref 22–29)
Creatinine: 1.4 mg/dL — ABNORMAL HIGH (ref 0.6–1.1)
Glucose: 97 mg/dl (ref 70–140)
Potassium: 3.9 mEq/L (ref 3.5–5.1)
Sodium: 145 mEq/L (ref 136–145)
Total Bilirubin: 0.73 mg/dL (ref 0.20–1.20)
Total Protein: 6.3 g/dL — ABNORMAL LOW (ref 6.4–8.3)

## 2014-07-09 LAB — CBC WITH DIFFERENTIAL/PLATELET
BASO%: 0.6 % (ref 0.0–2.0)
BASOS ABS: 0 10*3/uL (ref 0.0–0.1)
EOS%: 1.9 % (ref 0.0–7.0)
Eosinophils Absolute: 0.1 10*3/uL (ref 0.0–0.5)
HCT: 27.4 % — ABNORMAL LOW (ref 34.8–46.6)
HGB: 9.1 g/dL — ABNORMAL LOW (ref 11.6–15.9)
LYMPH%: 20 % (ref 14.0–49.7)
MCH: 27 pg (ref 25.1–34.0)
MCHC: 33.3 g/dL (ref 31.5–36.0)
MCV: 81 fL (ref 79.5–101.0)
MONO#: 0.2 10*3/uL (ref 0.1–0.9)
MONO%: 6 % (ref 0.0–14.0)
NEUT#: 2.5 10*3/uL (ref 1.5–6.5)
NEUT%: 71.5 % (ref 38.4–76.8)
Platelets: 64 10*3/uL — ABNORMAL LOW (ref 145–400)
RBC: 3.39 10*6/uL — ABNORMAL LOW (ref 3.70–5.45)
RDW: 14.4 % (ref 11.2–14.5)
WBC: 3.4 10*3/uL — ABNORMAL LOW (ref 3.9–10.3)
lymph#: 0.7 10*3/uL — ABNORMAL LOW (ref 0.9–3.3)

## 2014-07-09 LAB — TECHNOLOGIST REVIEW

## 2014-07-09 LAB — HOLD TUBE, BLOOD BANK

## 2014-07-09 NOTE — Telephone Encounter (Signed)
Spoke w/ pt and daughter in lobby this morning.  No transfusion needed today.  Pt states no further diarrhea,  Still has occasional nausea but no vomiting.  She feels she is able to keep enough fluids in and denies need for any IVFs.  She will return next week as scheduled.

## 2014-07-11 ENCOUNTER — Other Ambulatory Visit: Payer: Self-pay | Admitting: Hematology and Oncology

## 2014-07-11 ENCOUNTER — Other Ambulatory Visit: Payer: Self-pay | Admitting: *Deleted

## 2014-07-11 ENCOUNTER — Telehealth: Payer: Self-pay | Admitting: *Deleted

## 2014-07-11 DIAGNOSIS — D46C Myelodysplastic syndrome with isolated del(5q) chromosomal abnormality: Secondary | ICD-10-CM

## 2014-07-11 DIAGNOSIS — R112 Nausea with vomiting, unspecified: Secondary | ICD-10-CM

## 2014-07-11 NOTE — Telephone Encounter (Signed)
Daughter reports pt nauseated all day. No vomiting. No fever. No diarrhea or constipation.  Not able to drink more than a few sips and only a few bites of toast today.  Taking zofran three times daily and alternating w/ compazine every 6 hrs.  Pt also had a pain in her left side last night but it got better after eating a few bites of toast today.  Last BM yesterday.  Per Dr. Alvy Bimler,  Order IVFs x 3 days.  Arranged for pt to have IVFs tomorrow at [facility] at 11 am as no room here tomorrow.  Sent request to scheduler for IVFs here on Friday and Saturday.  Notified Daughter of above.  She verbalized understanding. Instructed daughter to call back if condition worsens or any fever.  She verbalized understanding.

## 2014-07-12 ENCOUNTER — Telehealth: Payer: Self-pay | Admitting: *Deleted

## 2014-07-12 ENCOUNTER — Non-Acute Institutional Stay (HOSPITAL_COMMUNITY)
Admission: AD | Admit: 2014-07-12 | Discharge: 2014-07-12 | Disposition: A | Discharge status: Home / Self Care | Payer: Medicare Other | Source: Ambulatory Visit | Attending: Hematology and Oncology | Admitting: Hematology and Oncology

## 2014-07-12 DIAGNOSIS — D46C Myelodysplastic syndrome with isolated del(5q) chromosomal abnormality: Secondary | ICD-10-CM | POA: Insufficient documentation

## 2014-07-12 DIAGNOSIS — R112 Nausea with vomiting, unspecified: Secondary | ICD-10-CM

## 2014-07-12 MED ORDER — SODIUM CHLORIDE 0.9 % IJ SOLN
10.0000 mL | INTRAMUSCULAR | Status: DC | PRN
  Administered 2014-07-12 (×2): 10 mL via INTRAVENOUS

## 2014-07-12 MED ORDER — HEPARIN SOD (PORK) LOCK FLUSH 100 UNIT/ML IV SOLN
500.0000 [IU] | Freq: Once | INTRAVENOUS | Status: AC
  Administered 2014-07-12: 500 [IU] via INTRAVENOUS
  Filled 2014-07-12: qty 5

## 2014-07-12 MED ORDER — SODIUM CHLORIDE 0.9 % IV SOLN
Freq: Once | INTRAVENOUS | Status: AC
  Administered 2014-07-12: 12:00:00 via INTRAVENOUS

## 2014-07-12 MED ORDER — SODIUM CHLORIDE 0.9 % IJ SOLN
INTRAMUSCULAR | Status: AC
  Administered 2014-07-12: 10 mL via INTRAVENOUS
  Filled 2014-07-12: qty 10

## 2014-07-12 MED ORDER — SODIUM CHLORIDE 0.9 % IV SOLN
8.0000 mg | Freq: Once | INTRAVENOUS | Status: AC
  Administered 2014-07-12: 8 mg via INTRAVENOUS
  Filled 2014-07-12 (×2): qty 4

## 2014-07-12 NOTE — Procedures (Signed)
[facility]  Procedure Note  MAURY BAMBA PYP:[id number] DOB: 1924-11-25 DOA: 07/12/2014   Ordering Physician: Dr. Natale Lay  Associated Diagnosis: MDS (myelodysplastic syndrome) with 5q deletion (238.74); Moderate nausea and vomiting (787.01)   Procedure Note: PAC accessed, 1 liter NS IVF given as ordered, IV zofran given as ordered, PAC deaccessed   Condition During Procedure: Tolerated well, no complaints.    Condition at Discharge: Tolerated well, no complaints. Patient in no apparent distress. Patient discharged home.  Patient left day hospital with belongings ambulatory escorted by her daughter in waiting area.     Wendie Simmer, RN  [facility]

## 2014-07-12 NOTE — Telephone Encounter (Signed)
Per staff message I have called the daughter and gave her the dates and times

## 2014-07-12 NOTE — H&P (Signed)
She is here for IVF only

## 2014-07-12 NOTE — Discharge Instructions (Signed)
Myelodysplastic Syndrome Myelodysplastic syndrome (MDS) is a group of blood diseases that affects new blood cells. This syndrome starts in the bone marrow where new blood cells are made. New blood cells are called immature cells or stem cells. With MDS, some immature cells do not grow into adult blood cells. Immature blood cells cannot live for very long in the body and they die. Over time, immature blood cells crowd out normal adult blood cells. This causes a low blood count. The bone marrow produces red blood cells which carry oxygen, white blood cells which fight infection, and platelets which help stop bleeding. If you have too few red blood cells, your blood may not have enough oxygen, and you will feel tired. If you do not have enough white blood cells, you might get infections often. If youhave too few platelets, you could bruise or bleed easily. CAUSES Sometimes, the cause is unknown. Known causes include:  Being exposed to certain drugs or chemicals.   Radiation treatment. This treatment is often given to fight cancer. Some things make it more likely that MDS will develop. These are called risk factors. They include:   Being white (Caucasian).   Being female.   Being older than 60 years.   Having been treated with cancer drugs or radiation.   Having been exposed to tobacco smoke, pesticides, lead, mercury, or the benzene chemical.  SYMPTOMS As MDS develops, the body reaches a point where it does not have enough adult blood cells. That is when symptoms usually start. Symptoms often develop slowly over time. The type of symptoms people have depends on which blood cells are affected. Symptoms may include:   Being tired all the time.   Feeling out of breath.   Having pale skin.   Getting infections often.   Having a fever often.   Bruising easily.   Bleeding that lasts longer than normal.   Having tiny spots of bleeding under the skin (petechiae). DIAGNOSIS A  physical exam is performed. Blood tests that may be performed include:  A complete blood count. This test counts the number of red, white, and platelet blood cells.   Peripheral blood smear. This test checks the number and type of blood cells. It also checks their size and shape.   Bone marrow testing. A small piece of bone marrow is examined under a microscope to look for MDS.  TREATMENT There is no cure for MDS. However, treatment is directed at relieving symptoms. Treatment can also slow down the making of immature blood cells. The type of treatment that is best for you depends on the type of MDS you have. Common treatments include:   Blood transfusions.  Medicines that:  Slow down the number of immature blood cells that are made.   Help immature cells develop into adult cells.   Make more blood cells.   Treat infections (antibiotics).  Bone marrow stem cell transplant. Your stem cells would be replaced with healthy stem cells from a donor.  HOME CARE INSTRUCTIONS  Only take medicine as prescribed by your caregiver. Follow the directions carefully. Do not start a new medicine unless your caregiver says it is okay.   Ask your caregiver if it is okay to take aspirin. Aspirin can make bleeding more likely.   Avoid dangerous activities to prevent bleeding. Ask your caregiver what activities are safe for you.   Check with your caregiver before you have any minor surgery or dental procedure.   Wash all fruits and vegetables before  eating them. Make sure meat and eggs are well-cooked. Do not eat raw fish and shellfish.   Wash your hands often. You can also use hand sanitizer. Stay away from people who are sick.   Keep all your follow-up appointments. SEEK MEDICAL CARE IF:  You feel more tired than normal.   You think you might have an infection.   You have more bruising than usual.   You have trouble catching your breath.  SEEK IMMEDIATE MEDICAL CARE IF:     You have an infection that is getting worse.   You have bleeding that you cannot stop.   You have trouble breathing.   You have chest pain.   You have a fever. Document Released: 03/22/2012 Document Reviewed: 03/22/2012 [facility] Patient Information 2015 [place]. This information is not intended to replace advice given to you by your health care provider. Make sure you discuss any questions you have with your health care provider. Nausea and Vomiting Nausea is a sick feeling that often comes before throwing up (vomiting). Vomiting is a reflex where stomach contents come out of your mouth. Vomiting can cause severe loss of body fluids (dehydration). Children and elderly adults can become dehydrated quickly, especially if they also have diarrhea. Nausea and vomiting are symptoms of a condition or disease. It is important to find the cause of your symptoms. CAUSES   Direct irritation of the stomach lining. This irritation can result from increased acid production (gastroesophageal reflux disease), infection, food poisoning, taking certain medicines (such as nonsteroidal anti-inflammatory drugs), alcohol use, or tobacco use.  Signals from the brain.These signals could be caused by a headache, heat exposure, an inner ear disturbance, increased pressure in the brain from injury, infection, a tumor, or a concussion, pain, emotional stimulus, or metabolic problems.  An obstruction in the gastrointestinal tract (bowel obstruction).  Illnesses such as diabetes, hepatitis, gallbladder problems, appendicitis, kidney problems, cancer, sepsis, atypical symptoms of a heart attack, or eating disorders.  Medical treatments such as chemotherapy and radiation.  Receiving medicine that makes you sleep (general anesthetic) during surgery. DIAGNOSIS Your caregiver may ask for tests to be done if the problems do not improve after a few days. Tests may also be done if symptoms are severe or if the  reason for the nausea and vomiting is not clear. Tests may include:  Urine tests.  Blood tests.  Stool tests.  Cultures (to look for evidence of infection).  X-rays or other imaging studies. Test results can help your caregiver make decisions about treatment or the need for additional tests. TREATMENT You need to stay well hydrated. Drink frequently but in small amounts.You may wish to drink water, sports drinks, clear broth, or eat frozen ice pops or gelatin dessert to help stay hydrated.When you eat, eating slowly may help prevent nausea.There are also some antinausea medicines that may help prevent nausea. HOME CARE INSTRUCTIONS   Take all medicine as directed by your caregiver.  If you do not have an appetite, do not force yourself to eat. However, you must continue to drink fluids.  If you have an appetite, eat a normal diet unless your caregiver tells you differently.  Eat a variety of complex carbohydrates (rice, wheat, potatoes, bread), lean meats, yogurt, fruits, and vegetables.  Avoid high-fat foods because they are more difficult to digest.  Drink enough water and fluids to keep your urine clear or pale yellow.  If you are dehydrated, ask your caregiver for specific rehydration instructions. Signs of dehydration  may include:  Severe thirst.  Dry lips and mouth.  Dizziness.  Dark urine.  Decreasing urine frequency and amount.  Confusion.  Rapid breathing or pulse. SEEK IMMEDIATE MEDICAL CARE IF:   You have blood or brown flecks (like coffee grounds) in your vomit.  You have black or bloody stools.  You have a severe headache or stiff neck.  You are confused.  You have severe abdominal pain.  You have chest pain or trouble breathing.  You do not urinate at least once every 8 hours.  You develop cold or clammy skin.  You continue to vomit for longer than 24 to 48 hours.  You have a fever. MAKE SURE YOU:   Understand these  instructions.  Will watch your condition.  Will get help right away if you are not doing well or get worse. Document Released: 09/21/2005 Document Revised: 12/14/2011 Document Reviewed: 02/18/2011 [facility] Patient Information 2015 [place], [place]. This information is not intended to replace advice given to you by your health care provider. Make sure you discuss any questions you have with your health care provider.

## 2014-07-12 NOTE — Telephone Encounter (Signed)
Per staff message I have ascheduled appts

## 2014-07-13 ENCOUNTER — Ambulatory Visit (HOSPITAL_BASED_OUTPATIENT_CLINIC_OR_DEPARTMENT_OTHER): Payer: Medicare Other

## 2014-07-13 VITALS — BP 121/44 | HR 62 | Temp 97.7°F | Resp 18

## 2014-07-13 DIAGNOSIS — R112 Nausea with vomiting, unspecified: Secondary | ICD-10-CM

## 2014-07-13 MED ORDER — SODIUM CHLORIDE 0.9 % IJ SOLN
10.0000 mL | INTRAMUSCULAR | Status: DC | PRN
  Administered 2014-07-13: 10 mL
  Filled 2014-07-13: qty 10

## 2014-07-13 MED ORDER — HEPARIN SOD (PORK) LOCK FLUSH 100 UNIT/ML IV SOLN
500.0000 [IU] | Freq: Once | INTRAVENOUS | Status: AC | PRN
  Administered 2014-07-13: 500 [IU]
  Filled 2014-07-13: qty 5

## 2014-07-13 MED ORDER — ONDANSETRON 8 MG/NS 50 ML IVPB
INTRAVENOUS | Status: AC
  Filled 2014-07-13: qty 8

## 2014-07-13 MED ORDER — SODIUM CHLORIDE 0.9 % IV SOLN
Freq: Once | INTRAVENOUS | Status: AC
  Administered 2014-07-13: 15:00:00 via INTRAVENOUS

## 2014-07-13 MED ORDER — ONDANSETRON 8 MG/50ML IVPB (CHCC)
8.0000 mg | Freq: Every day | INTRAVENOUS | Status: DC | PRN
  Administered 2014-07-13: 8 mg via INTRAVENOUS

## 2014-07-13 NOTE — Patient Instructions (Signed)

## 2014-07-14 ENCOUNTER — Ambulatory Visit (HOSPITAL_BASED_OUTPATIENT_CLINIC_OR_DEPARTMENT_OTHER): Payer: Medicare Other

## 2014-07-14 VITALS — BP 163/39 | HR 63 | Temp 98.0°F | Resp 18

## 2014-07-14 DIAGNOSIS — N179 Acute kidney failure, unspecified: Secondary | ICD-10-CM

## 2014-07-14 DIAGNOSIS — R112 Nausea with vomiting, unspecified: Secondary | ICD-10-CM

## 2014-07-14 MED ORDER — SODIUM CHLORIDE 0.9 % IJ SOLN
10.0000 mL | INTRAMUSCULAR | Status: DC | PRN
  Administered 2014-07-14: 10 mL
  Filled 2014-07-14: qty 10

## 2014-07-14 MED ORDER — SODIUM CHLORIDE 0.9 % IV SOLN
Freq: Once | INTRAVENOUS | Status: AC
  Administered 2014-07-14: 08:00:00 via INTRAVENOUS

## 2014-07-14 MED ORDER — HEPARIN SOD (PORK) LOCK FLUSH 100 UNIT/ML IV SOLN
500.0000 [IU] | Freq: Once | INTRAVENOUS | Status: AC | PRN
  Administered 2014-07-14: 500 [IU]
  Filled 2014-07-14: qty 5

## 2014-07-16 ENCOUNTER — Other Ambulatory Visit (HOSPITAL_BASED_OUTPATIENT_CLINIC_OR_DEPARTMENT_OTHER): Payer: Medicare Other

## 2014-07-16 ENCOUNTER — Telehealth: Payer: Self-pay | Admitting: Hematology and Oncology

## 2014-07-16 ENCOUNTER — Encounter: Payer: Self-pay | Admitting: Hematology and Oncology

## 2014-07-16 ENCOUNTER — Non-Acute Institutional Stay (HOSPITAL_COMMUNITY)
Admission: AD | Admit: 2014-07-16 | Discharge: 2014-07-16 | Disposition: A | Discharge status: Home / Self Care | Payer: Medicare Other | Source: Ambulatory Visit | Attending: Hematology and Oncology | Admitting: Hematology and Oncology

## 2014-07-16 ENCOUNTER — Ambulatory Visit (HOSPITAL_BASED_OUTPATIENT_CLINIC_OR_DEPARTMENT_OTHER): Payer: Medicare Other | Admitting: Hematology and Oncology

## 2014-07-16 ENCOUNTER — Non-Acute Institutional Stay (HOSPITAL_COMMUNITY)
Admission: RE | Admit: 2014-07-16 | Discharge: 2014-07-16 | Disposition: A | Discharge status: Home / Self Care | Payer: Medicare Other | Source: Ambulatory Visit | Admitting: Hematology and Oncology

## 2014-07-16 VITALS — BP 141/47 | HR 71 | Temp 97.4°F | Resp 18 | Ht 60.0 in | Wt 130.0 lb

## 2014-07-16 DIAGNOSIS — D46C Myelodysplastic syndrome with isolated del(5q) chromosomal abnormality: Secondary | ICD-10-CM | POA: Insufficient documentation

## 2014-07-16 DIAGNOSIS — D630 Anemia in neoplastic disease: Secondary | ICD-10-CM

## 2014-07-16 DIAGNOSIS — D6959 Other secondary thrombocytopenia: Secondary | ICD-10-CM

## 2014-07-16 DIAGNOSIS — R112 Nausea with vomiting, unspecified: Secondary | ICD-10-CM

## 2014-07-16 DIAGNOSIS — R1012 Left upper quadrant pain: Secondary | ICD-10-CM

## 2014-07-16 DIAGNOSIS — T50905A Adverse effect of unspecified drugs, medicaments and biological substances, initial encounter: Secondary | ICD-10-CM

## 2014-07-16 HISTORY — DX: Left upper quadrant pain: R10.12

## 2014-07-16 LAB — COMPREHENSIVE METABOLIC PANEL (CC13)
ALK PHOS: 37 U/L — AB (ref 40–150)
ALT: 44 U/L (ref 0–55)
ANION GAP: 11 meq/L (ref 3–11)
AST: 22 U/L (ref 5–34)
Albumin: 3.3 g/dL — ABNORMAL LOW (ref 3.5–5.0)
BILIRUBIN TOTAL: 0.57 mg/dL (ref 0.20–1.20)
BUN: 24.3 mg/dL (ref 7.0–26.0)
CO2: 27 meq/L (ref 22–29)
Calcium: 9.6 mg/dL (ref 8.4–10.4)
Chloride: 108 mEq/L (ref 98–109)
Creatinine: 1.2 mg/dL — ABNORMAL HIGH (ref 0.6–1.1)
GLUCOSE: 102 mg/dL (ref 70–140)
Potassium: 3.6 mEq/L (ref 3.5–5.1)
Sodium: 145 mEq/L (ref 136–145)
Total Protein: 5.9 g/dL — ABNORMAL LOW (ref 6.4–8.3)

## 2014-07-16 LAB — CBC WITH DIFFERENTIAL/PLATELET
BASO%: 0.6 % (ref 0.0–2.0)
BASOS ABS: 0 10*3/uL (ref 0.0–0.1)
EOS%: 1.7 % (ref 0.0–7.0)
Eosinophils Absolute: 0.1 10*3/uL (ref 0.0–0.5)
HCT: 20.2 % — ABNORMAL LOW (ref 34.8–46.6)
HEMOGLOBIN: 6.9 g/dL — AB (ref 11.6–15.9)
LYMPH%: 20.3 % (ref 14.0–49.7)
MCH: 27 pg (ref 25.1–34.0)
MCHC: 34.2 g/dL (ref 31.5–36.0)
MCV: 78.9 fL — AB (ref 79.5–101.0)
MONO#: 0.4 10*3/uL (ref 0.1–0.9)
MONO%: 9.9 % (ref 0.0–14.0)
NEUT#: 2.4 10*3/uL (ref 1.5–6.5)
NEUT%: 67.5 % (ref 38.4–76.8)
Platelets: 33 10*3/uL — ABNORMAL LOW (ref 145–400)
RBC: 2.56 10*6/uL — ABNORMAL LOW (ref 3.70–5.45)
RDW: 13.5 % (ref 11.2–14.5)
WBC: 3.5 10*3/uL — ABNORMAL LOW (ref 3.9–10.3)
lymph#: 0.7 10*3/uL — ABNORMAL LOW (ref 0.9–3.3)
nRBC: 0 % (ref 0–0)

## 2014-07-16 LAB — HOLD TUBE, BLOOD BANK

## 2014-07-16 LAB — TECHNOLOGIST REVIEW

## 2014-07-16 LAB — PREPARE RBC (CROSSMATCH)

## 2014-07-16 MED ORDER — RANITIDINE HCL 75 MG PO TABS: 75.0000 mg | ORAL_TABLET | Freq: Every day | ORAL | Status: DC

## 2014-07-16 MED ORDER — SODIUM CHLORIDE 0.9 % IV SOLN
250.0000 mL | Freq: Once | INTRAVENOUS | Status: AC
  Administered 2014-07-16: 250 mL via INTRAVENOUS

## 2014-07-16 MED ORDER — HEPARIN SOD (PORK) LOCK FLUSH 100 UNIT/ML IV SOLN
500.0000 [IU] | Freq: Every day | INTRAVENOUS | Status: AC | PRN
  Administered 2014-07-16: 500 [IU]
  Filled 2014-07-16: qty 5

## 2014-07-16 MED ORDER — SODIUM CHLORIDE 0.9 % IJ SOLN: 3.0000 mL | INTRAMUSCULAR | Status: DC | PRN

## 2014-07-16 MED ORDER — DIPHENHYDRAMINE HCL 25 MG PO CAPS
25.0000 mg | ORAL_CAPSULE | Freq: Once | ORAL | Status: AC
  Administered 2014-07-16: 25 mg via ORAL
  Filled 2014-07-16: qty 1

## 2014-07-16 MED ORDER — OMEPRAZOLE 40 MG PO CPDR
40.0000 mg | DELAYED_RELEASE_CAPSULE | Freq: Every day | ORAL | Status: AC
Start: 2014-07-16 — End: ?

## 2014-07-16 MED ORDER — HEPARIN SOD (PORK) LOCK FLUSH 100 UNIT/ML IV SOLN: 250.0000 [IU] | INTRAVENOUS | Status: DC | PRN

## 2014-07-16 MED ORDER — SODIUM CHLORIDE 0.9 % IJ SOLN: 10.0000 mL | INTRAMUSCULAR | Status: DC | PRN

## 2014-07-16 MED ORDER — ACETAMINOPHEN 325 MG PO TABS
650.0000 mg | ORAL_TABLET | Freq: Once | ORAL | Status: AC
  Administered 2014-07-16: 650 mg via ORAL
  Filled 2014-07-16: qty 2

## 2014-07-16 MED ORDER — FUROSEMIDE 10 MG/ML IJ SOLN
20.0000 mg | Freq: Once | INTRAMUSCULAR | Status: AC
  Administered 2014-07-16: 20 mg via INTRAVENOUS
  Filled 2014-07-16: qty 2

## 2014-07-16 NOTE — Progress Notes (Signed)
[place] OFFICE PROGRESS NOTE  Patient Care Team: Mayra Neer, MD as PCP - General (Family Medicine) Heath Lark, MD as Consulting Physician (Hematology and Oncology)  SUMMARY OF ONCOLOGIC HISTORY: This is a very pleasant 80 year old lady who become transfusion dependent since 2014. She denies any bleeding such as epistaxis, hematuria, or hematochezia. 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 Erythropoietin stimulating agents were stopped when her erythropoietin level came back over 500 In October 2014, she was started on Revlimid however develop severe allergic reaction to Revlimid. That was discontinued. On 08/07/2013, we started her on Vidaza for 2 cycles. On 10/04/2013, repeat a bone marrow aspirate and biopsy showed persistent disease. Additional testing revealed that her disease is JAK 2 positive On 11/06/2013, Vidaza is resumed after her second opinion from [facility] On 02/08/2014, repeat bone marrow aspirate and biopsy showed no evidence of increased blasts On 04/16/2014, Vidaza is discontinued due to increased transfusion requirement and progressive weakness On 04/30/14: she was started on 100 mg danazol On 05/21/14: Danazol is increased to 200 mg daily. On 06/18/2014, danazol was discontinued. On 06/25/14: Shanon Brow is added On 07/16/2014, Jakafi is on hold due to severe pancytopenia INTERVAL HISTORY: Please see below for problem oriented charting. She had extensive bruises, poor appetite and intermittent left abdominal pain. She have persistent nausea but no vomiting. She's not constipated. The patient denies any recent signs or symptoms of bleeding such as spontaneous epistaxis, hematuria or hematochezia.  REVIEW OF SYSTEMS:   Constitutional: Denies fevers, chills or abnormal weight loss Eyes: Denies blurriness of vision Ears, nose, mouth, throat, and face:  Denies mucositis or sore throat Respiratory: Denies cough, dyspnea or wheezes Cardiovascular: Denies palpitation, chest discomfort or lower extremity swelling Neurological:Denies numbness, tingling or new weaknesses Behavioral/Psych: Mood is stable, no new changes  All other systems were reviewed with the patient and are negative.  I have reviewed the past medical history, past surgical history, social history and family history with the patient and they are unchanged from previous note.  ALLERGIES:  is allergic to novocain; oxycodone; codeine; methimazole; and revlimid.  MEDICATIONS:  Current Outpatient Prescriptions  Medication Sig Dispense Refill  . acetaminophen (TYLENOL) 500 MG tablet Take 1,000 mg by mouth every 6 (six) hours as needed (Pain).       Marland Kitchen desoximetasone (TOPICORT) 0.25 % cream Apply 1 application topically 2 (two) times daily as needed. For rash      . hydrOXYzine (VISTARIL) 25 MG capsule Take 1 capsule (25 mg total) by mouth every 4 (four) hours as needed.  90 capsule  3  . insulin aspart (NOVOLOG) 100 UNIT/ML injection Inject 15 Units into the skin 3 (three) times daily before meals. For Blood sugar over 200      . insulin detemir (LEVEMIR) 100 UNIT/ML injection Inject 12 Units into the skin daily.       Marland Kitchen lidocaine-prilocaine (EMLA) cream       . LORazepam (ATIVAN) 0.5 MG tablet Take 0.5 mg by mouth at bedtime.       . metFORMIN (GLUCOPHAGE) 500 MG tablet Take 1 tablet (500 mg total) by mouth 2 (two) times daily with a meal.  60 tablet  1  . metoprolol succinate (TOPROL-XL) 100 MG 24 hr tablet Take 50 mg by mouth every morning.       . mometasone (ELOCON) 0.1 % cream Apply 1 application topically daily  as needed. Rash      . mometasone (NASONEX) 50 MCG/ACT nasal spray Place 2 sprays into the nose 2 (two) times daily.      . Multiple Vitamin (MULTIVITAMIN) tablet Take 1 tablet by mouth every morning.       . nisoldipine (SULAR) 17 MG 24 hr tablet Take 17 mg by mouth  daily.      Marland Kitchen olmesartan-hydrochlorothiazide (BENICAR HCT) 40-25 MG per tablet Take 1 tablet by mouth daily.      . ondansetron (ZOFRAN) 8 MG tablet       . potassium chloride SA (K-DUR,KLOR-CON) 20 MEQ tablet Take 1 tablet (20 mEq total) by mouth 2 (two) times daily.  60 tablet  1  . predniSONE (DELTASONE) 5 MG tablet Take 15 mg by mouth daily with breakfast.       . prochlorperazine (COMPAZINE) 10 MG tablet       . ruxolitinib phosphate (JAKAFI) 15 MG tablet Take 1 tablet (15 mg total) by mouth 2 (two) times daily.  60 tablet  3  . traMADol (ULTRAM) 50 MG tablet       . triamcinolone cream (KENALOG) 0.1 %       . omeprazole (PRILOSEC) 40 MG capsule Take 1 capsule (40 mg total) by mouth daily.  90 capsule  3  . ranitidine (ZANTAC) 75 MG tablet Take 1 tablet (75 mg total) by mouth at bedtime.  30 tablet  6   No current facility-administered medications for this visit.   Facility-Administered Medications Ordered in Other Visits  Medication Dose Route Frequency Provider Last Rate Last Dose  . sodium chloride 0.9 % injection 3 mL  3 mL Intracatheter PRN Heath Lark, MD        PHYSICAL EXAMINATION: ECOG PERFORMANCE STATUS: 1 - Symptomatic but completely ambulatory  Filed Vitals:   07/16/14 0948  BP: 141/47  Pulse: 71  Temp: 97.4 F (36.3 C)  Resp: 18   Filed Weights   07/16/14 0948  Weight: 130 lb (58.968 kg)    GENERAL:alert, no distress and comfortable SKIN: skin color, texture, turgor are normal, no rashes or significant lesions. She had extensive bruises EYES: normal, Conjunctiva are pink and non-injected, sclera clear OROPHARYNX:no exudate, no erythema and lips, buccal mucosa, and tongue normal  NECK: supple, thyroid normal size, non-tender, without nodularity LYMPH:  no palpable lymphadenopathy in the cervical, axillary or inguinal LUNGS: clear to auscultation and percussion with normal breathing effort HEART: regular rate & rhythm and no murmurs and no lower extremity  edema ABDOMEN:abdomen soft, non-tender and normal bowel sounds Musculoskeletal:no cyanosis of digits and no clubbing  NEURO: alert & oriented x 3 with fluent speech, no focal motor/sensory deficits  LABORATORY DATA:  I have reviewed the data as listed    Component Value Date/Time   NA 145 07/16/2014 0925   NA 134* 04/19/2014 1940   K 3.6 07/16/2014 0925   K 4.7 04/19/2014 1940   CL 92* 04/19/2014 1940   CO2 27 07/16/2014 0925   CO2 25 04/19/2014 1940   GLUCOSE 102 07/16/2014 0925   GLUCOSE 398* 04/19/2014 1940   BUN 24.3 07/16/2014 0925   BUN 42* 04/19/2014 1940   CREATININE 1.2* 07/16/2014 0925   CREATININE 0.80 04/19/2014 1940   CALCIUM 9.6 07/16/2014 0925   CALCIUM 9.8 04/19/2014 1940   PROT 5.9* 07/16/2014 0925   PROT 6.6 04/19/2014 1940   ALBUMIN 3.3* 07/16/2014 0925   ALBUMIN 3.0* 04/19/2014 1940   AST 22 07/16/2014 0925  AST 12 04/19/2014 1940   ALT 44 07/16/2014 0925   ALT 20 04/19/2014 1940   ALKPHOS 37* 07/16/2014 0925   ALKPHOS 60 04/19/2014 1940   BILITOT 0.57 07/16/2014 0925   BILITOT 0.6 04/19/2014 1940   GFRNONAA 65* 04/19/2014 1940   GFRAA 75* 04/19/2014 1940    No results found for this basename: SPEP,  UPEP,   kappa and lambda light chains    Lab Results  Component Value Date   WBC 3.5* 07/16/2014   NEUTROABS 2.4 07/16/2014   HGB 6.9* 07/16/2014   HCT 20.2* 07/16/2014   MCV 78.9* 07/16/2014   PLT 33* 07/16/2014      Chemistry      Component Value Date/Time   NA 145 07/16/2014 0925   NA 134* 04/19/2014 1940   K 3.6 07/16/2014 0925   K 4.7 04/19/2014 1940   CL 92* 04/19/2014 1940   CO2 27 07/16/2014 0925   CO2 25 04/19/2014 1940   BUN 24.3 07/16/2014 0925   BUN 42* 04/19/2014 1940   CREATININE 1.2* 07/16/2014 0925   CREATININE 0.80 04/19/2014 1940   GLU 199* 04/23/2014 0910      Component Value Date/Time   CALCIUM 9.6 07/16/2014 0925   CALCIUM 9.8 04/19/2014 1940   ALKPHOS 37* 07/16/2014 0925   ALKPHOS 60 04/19/2014 1940   AST 22 07/16/2014 0925    AST 12 04/19/2014 1940   ALT 44 07/16/2014 0925   ALT 20 04/19/2014 1940   BILITOT 0.57 07/16/2014 0925   BILITOT 0.6 04/19/2014 1940     ASSESSMENT & PLAN:  MDS (myelodysplastic syndrome) with 5q deletion She tolerates treatment very poorly. Due to severe thrombocytopenia, I will hold her chemotherapy. She will continue weekly blood work monitoring.  Moderate nausea and vomiting I would order a CT scan to exclude bowel obstruction as the cause of her nausea and vomiting. She will continue antiemetics. I am also concerned about potential gastritis due to long-term prednisone use. I will start her on Prilosec daily with Zantac.  Anemia in neoplastic disease We discussed some of the risks, benefits, and alternatives of blood transfusions. The patient is symptomatic from anemia and the hemoglobin level is critically low.  Some of the side-effects to be expected including risks of transfusion reactions, chills, infection, syndrome of volume overload and risk of hospitalization from various reasons and the patient is willing to proceed and went ahead. I will proceed to order 2 units of blood transfusion today and hold chemotherapy.     Thrombocytopenia due to drugs She has more extensive bruises. I will hold chemotherapy due to low platelet count.    Orders Placed This Encounter  Procedures  . CT Abdomen Pelvis Wo Contrast    Standing Status: Future     Number of Occurrences:      Standing Expiration Date: 10/16/2015    Order Specific Question:  Reason for Exam (SYMPTOM  OR DIAGNOSIS REQUIRED)    Answer:  severe abdominal pain, splenomegaly, myelofibrosis    Order Specific Question:  Preferred imaging location?    Answer:  [facility]   All questions were answered. The patient knows to call the clinic with any problems, questions or concerns. No barriers to learning was detected. I spent 30 minutes counseling the patient face to face. The total time spent in the appointment  was 40 minutes and more than 50% was on counseling and review of test results     [facility], [place], MD 07/16/2014 10:52 AM

## 2014-07-16 NOTE — Assessment & Plan Note (Signed)
She tolerates treatment very poorly. Due to severe thrombocytopenia, I will hold her chemotherapy. She will continue weekly blood work monitoring.

## 2014-07-16 NOTE — Assessment & Plan Note (Signed)
We discussed some of the risks, benefits, and alternatives of blood transfusions. The patient is symptomatic from anemia and the hemoglobin level is critically low.  Some of the side-effects to be expected including risks of transfusion reactions, chills, infection, syndrome of volume overload and risk of hospitalization from various reasons and the patient is willing to proceed and went ahead. I will proceed to order 2 units of blood transfusion today and hold chemotherapy.

## 2014-07-16 NOTE — H&P (Signed)
The patient is here for transfusion only  

## 2014-07-16 NOTE — Procedures (Signed)
[facility]  Procedure Note  Rebekah Cross HGD:[id number] DOB: [birth year] DOA: 07/16/2014   PCP: Mayra Neer, MD   Associated Diagnosis: Anemia in neoplastic disease  Procedure Note: Transfusion of 2 units PRBCs   Condition During Procedure: Pt tolerated well   Condition at Discharge:  Pt alert, oriented, ambulatory, port deaccessed; no complications noted   Nigel Sloop, [facility]

## 2014-07-16 NOTE — Telephone Encounter (Signed)
Pt confirmed labs/ov per 10/12 POF, gave pt AVS.... KJ °

## 2014-07-16 NOTE — Assessment & Plan Note (Signed)
She has more extensive bruises. I will hold chemotherapy due to low platelet count.

## 2014-07-16 NOTE — Assessment & Plan Note (Signed)
I would order a CT scan to exclude bowel obstruction as the cause of her nausea and vomiting. She will continue antiemetics. I am also concerned about potential gastritis due to long-term prednisone use. I will start her on Prilosec daily with Zantac.

## 2014-07-17 ENCOUNTER — Telehealth: Payer: Self-pay | Admitting: *Deleted

## 2014-07-17 ENCOUNTER — Ambulatory Visit (HOSPITAL_COMMUNITY)
Admission: RE | Admit: 2014-07-17 | Discharge: 2014-07-17 | Disposition: A | Discharge status: Home / Self Care | Payer: Medicare Other | Source: Ambulatory Visit | Attending: Hematology and Oncology | Admitting: Hematology and Oncology

## 2014-07-17 ENCOUNTER — Telehealth: Payer: Self-pay | Admitting: Hematology and Oncology

## 2014-07-17 ENCOUNTER — Other Ambulatory Visit: Payer: Self-pay | Admitting: Hematology and Oncology

## 2014-07-17 DIAGNOSIS — R1012 Left upper quadrant pain: Secondary | ICD-10-CM | POA: Insufficient documentation

## 2014-07-17 DIAGNOSIS — D46C Myelodysplastic syndrome with isolated del(5q) chromosomal abnormality: Secondary | ICD-10-CM | POA: Insufficient documentation

## 2014-07-17 DIAGNOSIS — D630 Anemia in neoplastic disease: Secondary | ICD-10-CM | POA: Insufficient documentation

## 2014-07-17 DIAGNOSIS — D469 Myelodysplastic syndrome, unspecified: Secondary | ICD-10-CM

## 2014-07-17 LAB — TYPE AND SCREEN
ABO/RH(D): A POS
ANTIBODY SCREEN: NEGATIVE
UNIT DIVISION: 0
Unit division: 0

## 2014-07-17 NOTE — Telephone Encounter (Signed)
Daughter asks if pt needs to have lab tomorrow to check Platelets before her CT scan?  Dau also reports pt has sore on her lip and reports roof of mouth is sore.

## 2014-07-17 NOTE — Telephone Encounter (Signed)
added lab per Cameo.Marland KitchenMarland Kitchenper Cameo she will contact pt

## 2014-07-17 NOTE — Telephone Encounter (Signed)
Please go ahead and add a lab appt

## 2014-07-17 NOTE — Telephone Encounter (Signed)
Daughter states pt has to arrive at CT at 1 pm to drink special contrast.  Requests lab at 12:30 pm.  Instructed dau to notify RN when pt here so we can look at her mouth.  She verbalized understanding.

## 2014-07-18 ENCOUNTER — Other Ambulatory Visit (HOSPITAL_BASED_OUTPATIENT_CLINIC_OR_DEPARTMENT_OTHER): Payer: Medicare Other

## 2014-07-18 ENCOUNTER — Ambulatory Visit (HOSPITAL_COMMUNITY)
Admit: 2014-07-18 | Discharge: 2014-07-18 | Disposition: A | Discharge status: Home / Self Care | Payer: Medicare Other | Source: Ambulatory Visit | Attending: Hematology and Oncology | Admitting: Hematology and Oncology

## 2014-07-18 ENCOUNTER — Telehealth: Payer: Self-pay | Admitting: *Deleted

## 2014-07-18 DIAGNOSIS — R1012 Left upper quadrant pain: Secondary | ICD-10-CM | POA: Diagnosis present

## 2014-07-18 DIAGNOSIS — E119 Type 2 diabetes mellitus without complications: Secondary | ICD-10-CM | POA: Insufficient documentation

## 2014-07-18 DIAGNOSIS — R161 Splenomegaly, not elsewhere classified: Secondary | ICD-10-CM | POA: Insufficient documentation

## 2014-07-18 DIAGNOSIS — D46C Myelodysplastic syndrome with isolated del(5q) chromosomal abnormality: Secondary | ICD-10-CM

## 2014-07-18 DIAGNOSIS — D7581 Myelofibrosis: Secondary | ICD-10-CM | POA: Diagnosis not present

## 2014-07-18 LAB — CBC WITH DIFFERENTIAL/PLATELET
BASO%: 0.5 % (ref 0.0–2.0)
Basophils Absolute: 0 10*3/uL (ref 0.0–0.1)
EOS ABS: 0.1 10*3/uL (ref 0.0–0.5)
EOS%: 1.4 % (ref 0.0–7.0)
HCT: 28.2 % — ABNORMAL LOW (ref 34.8–46.6)
HGB: 9.8 g/dL — ABNORMAL LOW (ref 11.6–15.9)
LYMPH%: 6.6 % — AB (ref 14.0–49.7)
MCH: 27.6 pg (ref 25.1–34.0)
MCHC: 34.8 g/dL (ref 31.5–36.0)
MCV: 79.4 fL — AB (ref 79.5–101.0)
MONO#: 0.5 10*3/uL (ref 0.1–0.9)
MONO%: 9.4 % (ref 0.0–14.0)
NEUT%: 82.1 % — ABNORMAL HIGH (ref 38.4–76.8)
NEUTROS ABS: 4.7 10*3/uL (ref 1.5–6.5)
NRBC: 0 % (ref 0–0)
Platelets: 27 10*3/uL — ABNORMAL LOW (ref 145–400)
RBC: 3.55 10*6/uL — AB (ref 3.70–5.45)
RDW: 14 % (ref 11.2–14.5)
WBC: 5.7 10*3/uL (ref 3.9–10.3)
lymph#: 0.4 10*3/uL — ABNORMAL LOW (ref 0.9–3.3)

## 2014-07-18 LAB — HOLD TUBE, BLOOD BANK

## 2014-07-18 LAB — GLUCOSE, CAPILLARY: Glucose-Capillary: 87 mg/dL (ref 70–99)

## 2014-07-18 LAB — TECHNOLOGIST REVIEW

## 2014-07-18 MED ORDER — IOHEXOL 300 MG/ML  SOLN: 50.0000 mL | Freq: Once | INTRAMUSCULAR | Status: AC | PRN

## 2014-07-18 NOTE — Telephone Encounter (Signed)
Message copied by Cathlean Cower on Wed Jul 18, 2014  1:02 PM ------      Message from: [facility], [place]: Wed Jul 18, 2014 12:52 PM      Regarding: no need transfusion                   ----- Message -----         From: Lab in [facility] Interface         Sent: 07/18/2014  12:45 PM           To: Heath Lark, MD             ------

## 2014-07-18 NOTE — Telephone Encounter (Signed)
Informed daughter of lab results.  No need for transfusion today.  Keep lab appt on Monday as scheduled.   Saw pt in lobby earlier today.  She states roof of mouth is not sore anymore.  She continues to have nausea and is having CT abd done today.

## 2014-07-19 ENCOUNTER — Other Ambulatory Visit: Payer: Self-pay | Admitting: Hematology and Oncology

## 2014-07-19 ENCOUNTER — Telehealth: Payer: Self-pay | Admitting: *Deleted

## 2014-07-19 ENCOUNTER — Ambulatory Visit (HOSPITAL_BASED_OUTPATIENT_CLINIC_OR_DEPARTMENT_OTHER): Payer: Medicare Other

## 2014-07-19 VITALS — BP 119/36 | HR 55 | Temp 98.7°F | Resp 18

## 2014-07-19 DIAGNOSIS — R112 Nausea with vomiting, unspecified: Secondary | ICD-10-CM

## 2014-07-19 MED ORDER — SODIUM CHLORIDE 0.9 % IJ SOLN
10.0000 mL | INTRAMUSCULAR | Status: DC | PRN
  Administered 2014-07-19: 10 mL via INTRAVENOUS
  Filled 2014-07-19: qty 10

## 2014-07-19 MED ORDER — SODIUM CHLORIDE 0.9 % IV SOLN
1000.0000 mL | Freq: Once | INTRAVENOUS | Status: AC
  Administered 2014-07-19: 1000 mL via INTRAVENOUS

## 2014-07-19 MED ORDER — SCOPOLAMINE 1 MG/3DAYS TD PT72SCOPOLAMINE 1 MG/3DAYS
1.0000 | MEDICATED_PATCH | TRANSDERMAL | Status: DC
Start: 2014-07-19 — End: 2014-12-05

## 2014-07-19 MED ORDER — HEPARIN SOD (PORK) LOCK FLUSH 100 UNIT/ML IV SOLN
500.0000 [IU] | Freq: Once | INTRAVENOUS | Status: AC
  Administered 2014-07-19: 500 [IU] via INTRAVENOUS
  Filled 2014-07-19: qty 5

## 2014-07-19 NOTE — Patient Instructions (Signed)
Dehydration, Adult Dehydration is when you lose more fluids from the body than you take in. Vital organs like the kidneys, brain, and heart cannot function without a proper amount of fluids and salt. Any loss of fluids from the body can cause dehydration.  CAUSES   Vomiting.  Diarrhea.  Excessive sweating.  Excessive urine output.  Fever. SYMPTOMS  Mild dehydration  Thirst.  Dry lips.  Slightly dry mouth. Moderate dehydration  Very dry mouth.  Sunken eyes.  Skin does not bounce back quickly when lightly pinched and released.  Dark urine and decreased urine production.  Decreased tear production.  Headache. Severe dehydration  Very dry mouth.  Extreme thirst.  Rapid, weak pulse (more than 100 beats per minute at rest).  Cold hands and feet.  Not able to sweat in spite of heat and temperature.  Rapid breathing.  Blue lips.  Confusion and lethargy.  Difficulty being awakened.  Minimal urine production.  No tears. DIAGNOSIS  Your caregiver will diagnose dehydration based on your symptoms and your exam. Blood and urine tests will help confirm the diagnosis. The diagnostic evaluation should also identify the cause of dehydration. TREATMENT  Treatment of mild or moderate dehydration can often be done at home by increasing the amount of fluids that you drink. It is best to drink small amounts of fluid more often. Drinking too much at one time can make vomiting worse. Refer to the home care instructions below. Severe dehydration needs to be treated at the hospital where you will probably be given intravenous (IV) fluids that contain water and electrolytes. HOME CARE INSTRUCTIONS   Ask your caregiver about specific rehydration instructions.  Drink enough fluids to keep your urine clear or pale yellow.  Drink small amounts frequently if you have nausea and vomiting.  Eat as you normally do.  Avoid:  Foods or drinks high in sugar.  Carbonated  drinks.  Juice.  Extremely hot or cold fluids.  Drinks with caffeine.  Fatty, greasy foods.  Alcohol.  Tobacco.  Overeating.  Gelatin desserts.  Wash your hands well to avoid spreading bacteria and viruses.  Only take over-the-counter or prescription medicines for pain, discomfort, or fever as directed by your caregiver.  Ask your caregiver if you should continue all prescribed and over-the-counter medicines.  Keep all follow-up appointments with your caregiver. SEEK MEDICAL CARE IF:  You have abdominal pain and it increases or stays in one area (localizes).  You have a rash, stiff neck, or severe headache.  You are irritable, sleepy, or difficult to awaken.  You are weak, dizzy, or extremely thirsty. SEEK IMMEDIATE MEDICAL CARE IF:   You are unable to keep fluids down or you get worse despite treatment.  You have frequent episodes of vomiting or diarrhea.  You have blood or green matter (bile) in your vomit.  You have blood in your stool or your stool looks black and tarry.  You have not urinated in 6 to 8 hours, or you have only urinated a small amount of very dark urine.  You have a fever.  You faint. MAKE SURE YOU:   Understand these instructions.  Will watch your condition.  Will get help right away if you are not doing well or get worse. Document Released: 09/21/2005 Document Revised: 12/14/2011 Document Reviewed: 05/11/2011 ExitCare Patient Information 2015 [employer]. This information is not intended to replace advice given to you by your health care provider. Make sure you discuss any questions you have with your health care   provider.  

## 2014-07-19 NOTE — Telephone Encounter (Signed)
Informed daughter of CT results wnl and Dr. Alvy Bimler says pt could still have gastritis.  Rebekah Cross says pt vomited a lot yesterday after her CT and has only been able to take sips of fluids and some soup.   Pt still nauseated this morning, no vomiting yet today.  She is taking her anti emetics routinely as directed.  Can pt have IVFs this afternoon?   Pt unable to come tomorrow as her other daughter is having her leg amputated tomorrow.

## 2014-07-19 NOTE — Telephone Encounter (Signed)
Left VM for daughter requesting she return nurse's call.

## 2014-07-19 NOTE — Telephone Encounter (Signed)
Informed daughter of IVFs scheduled today at 1 pm and will also schedule for Saturday.  She verbalized understanding and states pt has transportation for tomorrow as well if she needs IVFs tomorrow.  Sent request to Scheduler to add pt on for IVFs tomorrow in addition to Saturday.

## 2014-07-19 NOTE — Telephone Encounter (Signed)
Per staff message I have scheduled appt. Patient will pick up schedule today

## 2014-07-19 NOTE — Telephone Encounter (Signed)
Message copied by Cathlean Cower on Thu Jul 19, 2014  8:46 AM ------      Message from: [facility], [place]: Wed Jul 18, 2014  7:43 PM      Regarding: CT       Pls let her daughter know CT OK, nothing abnormal, could still be due to gastritis.      ----- Message -----         From: Rad Results In Interface         Sent: 07/18/2014   4:28 PM           To: Heath Lark, MD                   ------

## 2014-07-20 ENCOUNTER — Ambulatory Visit (HOSPITAL_BASED_OUTPATIENT_CLINIC_OR_DEPARTMENT_OTHER): Payer: Medicare Other

## 2014-07-20 VITALS — BP 115/43 | HR 71 | Temp 97.1°F | Resp 19

## 2014-07-20 DIAGNOSIS — R112 Nausea with vomiting, unspecified: Secondary | ICD-10-CM

## 2014-07-20 MED ORDER — ONDANSETRON 8 MG/50ML IVPB (CHCC)
8.0000 mg | Freq: Every day | INTRAVENOUS | Status: DC | PRN
  Administered 2014-07-20: 8 mg via INTRAVENOUS

## 2014-07-20 MED ORDER — HEPARIN SOD (PORK) LOCK FLUSH 100 UNIT/ML IV SOLN
500.0000 [IU] | Freq: Once | INTRAVENOUS | Status: AC | PRN
  Administered 2014-07-20: 500 [IU]
  Filled 2014-07-20: qty 5

## 2014-07-20 MED ORDER — SODIUM CHLORIDE 0.9 % IV SOLN
Freq: Once | INTRAVENOUS | Status: AC
  Administered 2014-07-20: 11:00:00 via INTRAVENOUS

## 2014-07-20 MED ORDER — SODIUM CHLORIDE 0.9 % IJ SOLN
10.0000 mL | INTRAMUSCULAR | Status: DC | PRN
  Administered 2014-07-20: 10 mL
  Filled 2014-07-20: qty 10

## 2014-07-20 MED ORDER — ONDANSETRON 8 MG/NS 50 ML IVPB
INTRAVENOUS | Status: AC
  Filled 2014-07-20: qty 8

## 2014-07-20 NOTE — Patient Instructions (Signed)
Dehydration, Adult Dehydration is when you lose more fluids from the body than you take in. Vital organs like the kidneys, brain, and heart cannot function without a proper amount of fluids and salt. Any loss of fluids from the body can cause dehydration.  CAUSES   Vomiting.  Diarrhea.  Excessive sweating.  Excessive urine output.  Fever. SYMPTOMS  Mild dehydration  Thirst.  Dry lips.  Slightly dry mouth. Moderate dehydration  Very dry mouth.  Sunken eyes.  Skin does not bounce back quickly when lightly pinched and released.  Dark urine and decreased urine production.  Decreased tear production.  Headache. Severe dehydration  Very dry mouth.  Extreme thirst.  Rapid, weak pulse (more than 100 beats per minute at rest).  Cold hands and feet.  Not able to sweat in spite of heat and temperature.  Rapid breathing.  Blue lips.  Confusion and lethargy.  Difficulty being awakened.  Minimal urine production.  No tears. DIAGNOSIS  Your caregiver will diagnose dehydration based on your symptoms and your exam. Blood and urine tests will help confirm the diagnosis. The diagnostic evaluation should also identify the cause of dehydration. TREATMENT  Treatment of mild or moderate dehydration can often be done at home by increasing the amount of fluids that you drink. It is best to drink small amounts of fluid more often. Drinking too much at one time can make vomiting worse. Refer to the home care instructions below. Severe dehydration needs to be treated at the hospital where you will probably be given intravenous (IV) fluids that contain water and electrolytes. HOME CARE INSTRUCTIONS   Ask your caregiver about specific rehydration instructions.  Drink enough fluids to keep your urine clear or pale yellow.  Drink small amounts frequently if you have nausea and vomiting.  Eat as you normally do.  Avoid:  Foods or drinks high in sugar.  Carbonated  drinks.  Juice.  Extremely hot or cold fluids.  Drinks with caffeine.  Fatty, greasy foods.  Alcohol.  Tobacco.  Overeating.  Gelatin desserts.  Wash your hands well to avoid spreading bacteria and viruses.  Only take over-the-counter or prescription medicines for pain, discomfort, or fever as directed by your caregiver.  Ask your caregiver if you should continue all prescribed and over-the-counter medicines.  Keep all follow-up appointments with your caregiver. SEEK MEDICAL CARE IF:  You have abdominal pain and it increases or stays in one area (localizes).  You have a rash, stiff neck, or severe headache.  You are irritable, sleepy, or difficult to awaken.  You are weak, dizzy, or extremely thirsty. SEEK IMMEDIATE MEDICAL CARE IF:   You are unable to keep fluids down or you get worse despite treatment.  You have frequent episodes of vomiting or diarrhea.  You have blood or green matter (bile) in your vomit.  You have blood in your stool or your stool looks black and tarry.  You have not urinated in 6 to 8 hours, or you have only urinated a small amount of very dark urine.  You have a fever.  You faint. MAKE SURE YOU:   Understand these instructions.  Will watch your condition.  Will get help right away if you are not doing well or get worse. Document Released: 09/21/2005 Document Revised: 12/14/2011 Document Reviewed: 05/11/2011 ExitCare Patient Information 2015 [employer]. This information is not intended to replace advice given to you by your health care provider. Make sure you discuss any questions you have with your health care   provider.  

## 2014-07-21 ENCOUNTER — Ambulatory Visit (HOSPITAL_BASED_OUTPATIENT_CLINIC_OR_DEPARTMENT_OTHER): Payer: Medicare Other

## 2014-07-21 ENCOUNTER — Other Ambulatory Visit: Payer: Self-pay | Admitting: *Deleted

## 2014-07-21 VITALS — BP 186/47 | HR 78 | Temp 98.2°F | Resp 18

## 2014-07-21 DIAGNOSIS — R112 Nausea with vomiting, unspecified: Secondary | ICD-10-CM

## 2014-07-21 DIAGNOSIS — D46C Myelodysplastic syndrome with isolated del(5q) chromosomal abnormality: Secondary | ICD-10-CM

## 2014-07-21 MED ORDER — HEPARIN SOD (PORK) LOCK FLUSH 100 UNIT/ML IV SOLN
500.0000 [IU] | Freq: Once | INTRAVENOUS | Status: AC | PRN
  Administered 2014-07-21: 500 [IU]
  Filled 2014-07-21: qty 5

## 2014-07-21 MED ORDER — HEPARIN SOD (PORK) LOCK FLUSH 100 UNIT/ML IV SOLN
250.0000 [IU] | INTRAVENOUS | Status: DC | PRN
  Filled 2014-07-21: qty 5

## 2014-07-21 MED ORDER — SODIUM CHLORIDE 0.9 % IJ SOLN
10.0000 mL | INTRAMUSCULAR | Status: DC | PRN
  Administered 2014-07-21: 10 mL
  Filled 2014-07-21: qty 10

## 2014-07-21 MED ORDER — SODIUM CHLORIDE 0.9 % IJ SOLN
3.0000 mL | INTRAMUSCULAR | Status: DC | PRN
  Filled 2014-07-21: qty 10

## 2014-07-21 MED ORDER — ONDANSETRON HCL 8 MG PO TABS: 8.0000 mg | ORAL_TABLET | Freq: Two times a day (BID) | ORAL | Status: DC | PRN

## 2014-07-21 MED ORDER — SODIUM CHLORIDE 0.9 % IV SOLN
Freq: Once | INTRAVENOUS | Status: AC
  Administered 2014-07-21: 09:00:00 via INTRAVENOUS

## 2014-07-21 MED ORDER — ONDANSETRON 8 MG/50ML IVPB (CHCC)
8.0000 mg | Freq: Every day | INTRAVENOUS | Status: DC | PRN
Start: 2014-07-21 — End: 2014-07-21
  Administered 2014-07-21: 8 mg via INTRAVENOUS

## 2014-07-21 NOTE — Patient Instructions (Signed)
Dehydration, Adult Dehydration is when you lose more fluids from the body than you take in. Vital organs like the kidneys, brain, and heart cannot function without a proper amount of fluids and salt. Any loss of fluids from the body can cause dehydration.  CAUSES   Vomiting.  Diarrhea.  Excessive sweating.  Excessive urine output.  Fever. SYMPTOMS  Mild dehydration  Thirst.  Dry lips.  Slightly dry mouth. Moderate dehydration  Very dry mouth.  Sunken eyes.  Skin does not bounce back quickly when lightly pinched and released.  Dark urine and decreased urine production.  Decreased tear production.  Headache. Severe dehydration  Very dry mouth.  Extreme thirst.  Rapid, weak pulse (more than 100 beats per minute at rest).  Cold hands and feet.  Not able to sweat in spite of heat and temperature.  Rapid breathing.  Blue lips.  Confusion and lethargy.  Difficulty being awakened.  Minimal urine production.  No tears. DIAGNOSIS  Your caregiver will diagnose dehydration based on your symptoms and your exam. Blood and urine tests will help confirm the diagnosis. The diagnostic evaluation should also identify the cause of dehydration. TREATMENT  Treatment of mild or moderate dehydration can often be done at home by increasing the amount of fluids that you drink. It is best to drink small amounts of fluid more often. Drinking too much at one time can make vomiting worse. Refer to the home care instructions below. Severe dehydration needs to be treated at the hospital where you will probably be given intravenous (IV) fluids that contain water and electrolytes. HOME CARE INSTRUCTIONS   Ask your caregiver about specific rehydration instructions.  Drink enough fluids to keep your urine clear or pale yellow.  Drink small amounts frequently if you have nausea and vomiting.  Eat as you normally do.  Avoid:  Foods or drinks high in sugar.  Carbonated  drinks.  Juice.  Extremely hot or cold fluids.  Drinks with caffeine.  Fatty, greasy foods.  Alcohol.  Tobacco.  Overeating.  Gelatin desserts.  Wash your hands well to avoid spreading bacteria and viruses.  Only take over-the-counter or prescription medicines for pain, discomfort, or fever as directed by your caregiver.  Ask your caregiver if you should continue all prescribed and over-the-counter medicines.  Keep all follow-up appointments with your caregiver. SEEK MEDICAL CARE IF:  You have abdominal pain and it increases or stays in one area (localizes).  You have a rash, stiff neck, or severe headache.  You are irritable, sleepy, or difficult to awaken.  You are weak, dizzy, or extremely thirsty. SEEK IMMEDIATE MEDICAL CARE IF:   You are unable to keep fluids down or you get worse despite treatment.  You have frequent episodes of vomiting or diarrhea.  You have blood or green matter (bile) in your vomit.  You have blood in your stool or your stool looks black and tarry.  You have not urinated in 6 to 8 hours, or you have only urinated a small amount of very dark urine.  You have a fever.  You faint. MAKE SURE YOU:   Understand these instructions.  Will watch your condition.  Will get help right away if you are not doing well or get worse. Document Released: 09/21/2005 Document Revised: 12/14/2011 Document Reviewed: 05/11/2011 ExitCare Patient Information 2015 [employer]. This information is not intended to replace advice given to you by your health care provider. Make sure you discuss any questions you have with your health care   provider.  

## 2014-07-23 ENCOUNTER — Other Ambulatory Visit (HOSPITAL_BASED_OUTPATIENT_CLINIC_OR_DEPARTMENT_OTHER): Payer: Medicare Other

## 2014-07-23 ENCOUNTER — Other Ambulatory Visit: Payer: Self-pay | Admitting: Hematology and Oncology

## 2014-07-23 ENCOUNTER — Ambulatory Visit (HOSPITAL_BASED_OUTPATIENT_CLINIC_OR_DEPARTMENT_OTHER): Payer: Medicare Other

## 2014-07-23 ENCOUNTER — Telehealth: Payer: Self-pay | Admitting: *Deleted

## 2014-07-23 VITALS — BP 124/44 | HR 68 | Temp 98.6°F | Resp 20

## 2014-07-23 DIAGNOSIS — R1012 Left upper quadrant pain: Secondary | ICD-10-CM | POA: Diagnosis not present

## 2014-07-23 DIAGNOSIS — D630 Anemia in neoplastic disease: Secondary | ICD-10-CM

## 2014-07-23 DIAGNOSIS — D46C Myelodysplastic syndrome with isolated del(5q) chromosomal abnormality: Secondary | ICD-10-CM

## 2014-07-23 DIAGNOSIS — D469 Myelodysplastic syndrome, unspecified: Secondary | ICD-10-CM | POA: Diagnosis present

## 2014-07-23 LAB — COMPREHENSIVE METABOLIC PANEL (CC13)
ALK PHOS: 42 U/L (ref 40–150)
ALT: 47 U/L (ref 0–55)
AST: 20 U/L (ref 5–34)
Albumin: 3.2 g/dL — ABNORMAL LOW (ref 3.5–5.0)
Anion Gap: 11 mEq/L (ref 3–11)
BUN: 23.6 mg/dL (ref 7.0–26.0)
CO2: 27 mEq/L (ref 22–29)
CREATININE: 1 mg/dL (ref 0.6–1.1)
Calcium: 9.3 mg/dL (ref 8.4–10.4)
Chloride: 104 mEq/L (ref 98–109)
Glucose: 131 mg/dl (ref 70–140)
Potassium: 3.5 mEq/L (ref 3.5–5.1)
Sodium: 142 mEq/L (ref 136–145)
Total Bilirubin: 1.34 mg/dL — ABNORMAL HIGH (ref 0.20–1.20)
Total Protein: 6 g/dL — ABNORMAL LOW (ref 6.4–8.3)

## 2014-07-23 LAB — CBC WITH DIFFERENTIAL/PLATELET
BASO%: 0.5 % (ref 0.0–2.0)
Basophils Absolute: 0 10*3/uL (ref 0.0–0.1)
EOS%: 3.7 % (ref 0.0–7.0)
Eosinophils Absolute: 0.3 10*3/uL (ref 0.0–0.5)
HCT: 21.8 % — ABNORMAL LOW (ref 34.8–46.6)
HGB: 7.4 g/dL — ABNORMAL LOW (ref 11.6–15.9)
LYMPH%: 3.9 % — AB (ref 14.0–49.7)
MCH: 27.2 pg (ref 25.1–34.0)
MCHC: 33.9 g/dL (ref 31.5–36.0)
MCV: 80.1 fL (ref 79.5–101.0)
MONO#: 0.8 10*3/uL (ref 0.1–0.9)
MONO%: 10.1 % (ref 0.0–14.0)
NEUT#: 6.7 10*3/uL — ABNORMAL HIGH (ref 1.5–6.5)
NEUT%: 81.8 % — ABNORMAL HIGH (ref 38.4–76.8)
NRBC: 0 % (ref 0–0)
PLATELETS: 42 10*3/uL — AB (ref 145–400)
RBC: 2.72 10*6/uL — AB (ref 3.70–5.45)
RDW: 14.2 % (ref 11.2–14.5)
WBC: 8.2 10*3/uL (ref 3.9–10.3)
lymph#: 0.3 10*3/uL — ABNORMAL LOW (ref 0.9–3.3)

## 2014-07-23 LAB — PREPARE RBC (CROSSMATCH)

## 2014-07-23 LAB — HOLD TUBE, BLOOD BANK

## 2014-07-23 LAB — TECHNOLOGIST REVIEW: Technologist Review: 2

## 2014-07-23 MED ORDER — HEPARIN SOD (PORK) LOCK FLUSH 100 UNIT/ML IV SOLN
500.0000 [IU] | Freq: Every day | INTRAVENOUS | Status: AC | PRN
Start: 2014-07-23 — End: 2014-07-23
  Administered 2014-07-23: 500 [IU]
  Filled 2014-07-23: qty 5

## 2014-07-23 MED ORDER — DIPHENHYDRAMINE HCL 25 MG PO CAPS
ORAL_CAPSULE | ORAL | Status: AC
  Filled 2014-07-23: qty 1

## 2014-07-23 MED ORDER — DIPHENHYDRAMINE HCL 25 MG PO CAPS
25.0000 mg | ORAL_CAPSULE | Freq: Once | ORAL | Status: AC
  Administered 2014-07-23: 25 mg via ORAL

## 2014-07-23 MED ORDER — FUROSEMIDE 10 MG/ML IJ SOLN
20.0000 mg | Freq: Once | INTRAMUSCULAR | Status: AC
  Administered 2014-07-23: 20 mg via INTRAVENOUS

## 2014-07-23 MED ORDER — ACETAMINOPHEN 325 MG PO TABS
ORAL_TABLET | ORAL | Status: AC
  Filled 2014-07-23: qty 2

## 2014-07-23 MED ORDER — SODIUM CHLORIDE 0.9 % IV SOLN
250.0000 mL | Freq: Once | INTRAVENOUS | Status: AC
  Administered 2014-07-23: 250 mL via INTRAVENOUS

## 2014-07-23 MED ORDER — ACETAMINOPHEN 325 MG PO TABS
650.0000 mg | ORAL_TABLET | Freq: Once | ORAL | Status: AC
  Administered 2014-07-23: 650 mg via ORAL

## 2014-07-23 MED ORDER — SODIUM CHLORIDE 0.9 % IJ SOLN
10.0000 mL | INTRAMUSCULAR | Status: AC | PRN
  Administered 2014-07-23: 10 mL
  Filled 2014-07-23: qty 10

## 2014-07-23 NOTE — Telephone Encounter (Signed)
Per staff message I have scheduled appts. Patietn to pick up scheduel today

## 2014-07-23 NOTE — Patient Instructions (Signed)

## 2014-07-24 ENCOUNTER — Encounter: Payer: Self-pay | Admitting: *Deleted

## 2014-07-24 LAB — TYPE AND SCREEN
ABO/RH(D): A POS
Antibody Screen: NEGATIVE
UNIT DIVISION: 0
Unit division: 0

## 2014-07-25 ENCOUNTER — Ambulatory Visit (HOSPITAL_BASED_OUTPATIENT_CLINIC_OR_DEPARTMENT_OTHER): Payer: Medicare Other

## 2014-07-25 VITALS — BP 140/45 | HR 75 | Temp 97.9°F | Resp 18

## 2014-07-25 DIAGNOSIS — R112 Nausea with vomiting, unspecified: Secondary | ICD-10-CM

## 2014-07-25 MED ORDER — SODIUM CHLORIDE 0.9 % IJ SOLN
10.0000 mL | INTRAMUSCULAR | Status: DC | PRN
  Administered 2014-07-25: 10 mL
  Filled 2014-07-25: qty 10

## 2014-07-25 MED ORDER — HEPARIN SOD (PORK) LOCK FLUSH 100 UNIT/ML IV SOLN
500.0000 [IU] | Freq: Once | INTRAVENOUS | Status: AC | PRN
  Administered 2014-07-25: 500 [IU]
  Filled 2014-07-25: qty 5

## 2014-07-25 MED ORDER — SODIUM CHLORIDE 0.9 % IV SOLN
Freq: Once | INTRAVENOUS | Status: AC
  Administered 2014-07-25: 15:00:00 via INTRAVENOUS

## 2014-07-25 NOTE — Patient Instructions (Signed)
Dehydration, Adult Dehydration is when you lose more fluids from the body than you take in. Vital organs like the kidneys, brain, and heart cannot function without a proper amount of fluids and salt. Any loss of fluids from the body can cause dehydration.  CAUSES   Vomiting.  Diarrhea.  Excessive sweating.  Excessive urine output.  Fever. SYMPTOMS  Mild dehydration  Thirst.  Dry lips.  Slightly dry mouth. Moderate dehydration  Very dry mouth.  Sunken eyes.  Skin does not bounce back quickly when lightly pinched and released.  Dark urine and decreased urine production.  Decreased tear production.  Headache. Severe dehydration  Very dry mouth.  Extreme thirst.  Rapid, weak pulse (more than 100 beats per minute at rest).  Cold hands and feet.  Not able to sweat in spite of heat and temperature.  Rapid breathing.  Blue lips.  Confusion and lethargy.  Difficulty being awakened.  Minimal urine production.  No tears. DIAGNOSIS  Your caregiver will diagnose dehydration based on your symptoms and your exam. Blood and urine tests will help confirm the diagnosis. The diagnostic evaluation should also identify the cause of dehydration. TREATMENT  Treatment of mild or moderate dehydration can often be done at home by increasing the amount of fluids that you drink. It is best to drink small amounts of fluid more often. Drinking too much at one time can make vomiting worse. Refer to the home care instructions below. Severe dehydration needs to be treated at the hospital where you will probably be given intravenous (IV) fluids that contain water and electrolytes. HOME CARE INSTRUCTIONS   Ask your caregiver about specific rehydration instructions.  Drink enough fluids to keep your urine clear or pale yellow.  Drink small amounts frequently if you have nausea and vomiting.  Eat as you normally do.  Avoid:  Foods or drinks high in sugar.  Carbonated  drinks.  Juice.  Extremely hot or cold fluids.  Drinks with caffeine.  Fatty, greasy foods.  Alcohol.  Tobacco.  Overeating.  Gelatin desserts.  Wash your hands well to avoid spreading bacteria and viruses.  Only take over-the-counter or prescription medicines for pain, discomfort, or fever as directed by your caregiver.  Ask your caregiver if you should continue all prescribed and over-the-counter medicines.  Keep all follow-up appointments with your caregiver. SEEK MEDICAL CARE IF:  You have abdominal pain and it increases or stays in one area (localizes).  You have a rash, stiff neck, or severe headache.  You are irritable, sleepy, or difficult to awaken.  You are weak, dizzy, or extremely thirsty. SEEK IMMEDIATE MEDICAL CARE IF:   You are unable to keep fluids down or you get worse despite treatment.  You have frequent episodes of vomiting or diarrhea.  You have blood or green matter (bile) in your vomit.  You have blood in your stool or your stool looks black and tarry.  You have not urinated in 6 to 8 hours, or you have only urinated a small amount of very dark urine.  You have a fever.  You faint. MAKE SURE YOU:   Understand these instructions.  Will watch your condition.  Will get help right away if you are not doing well or get worse. Document Released: 09/21/2005 Document Revised: 12/14/2011 Document Reviewed: 05/11/2011 ExitCare Patient Information 2015 [employer]. This information is not intended to replace advice given to you by your health care provider. Make sure you discuss any questions you have with your health care   provider.  

## 2014-07-26 ENCOUNTER — Ambulatory Visit (HOSPITAL_BASED_OUTPATIENT_CLINIC_OR_DEPARTMENT_OTHER): Payer: Medicare Other

## 2014-07-26 VITALS — BP 132/42 | HR 76 | Temp 98.6°F | Resp 18

## 2014-07-26 DIAGNOSIS — R112 Nausea with vomiting, unspecified: Secondary | ICD-10-CM

## 2014-07-26 MED ORDER — HEPARIN SOD (PORK) LOCK FLUSH 100 UNIT/ML IV SOLN
500.0000 [IU] | Freq: Once | INTRAVENOUS | Status: AC | PRN
  Administered 2014-07-26: 500 [IU]
  Filled 2014-07-26: qty 5

## 2014-07-26 MED ORDER — SODIUM CHLORIDE 0.9 % IJ SOLN
10.0000 mL | INTRAMUSCULAR | Status: DC | PRN
  Administered 2014-07-26: 10 mL
  Filled 2014-07-26: qty 10

## 2014-07-26 MED ORDER — ONDANSETRON 8 MG/50ML IVPB (CHCC)
8.0000 mg | Freq: Every day | INTRAVENOUS | Status: DC | PRN
  Administered 2014-07-26: 8 mg via INTRAVENOUS

## 2014-07-26 MED ORDER — SODIUM CHLORIDE 0.9 % IV SOLN
Freq: Once | INTRAVENOUS | Status: AC
  Administered 2014-07-26: 13:00:00 via INTRAVENOUS

## 2014-07-26 MED ORDER — ONDANSETRON 8 MG/NS 50 ML IVPB
INTRAVENOUS | Status: AC
  Filled 2014-07-26: qty 8

## 2014-07-26 NOTE — Patient Instructions (Signed)
Dehydration, Adult Dehydration is when you lose more fluids from the body than you take in. Vital organs like the kidneys, brain, and heart cannot function without a proper amount of fluids and salt. Any loss of fluids from the body can cause dehydration.  CAUSES   Vomiting.  Diarrhea.  Excessive sweating.  Excessive urine output.  Fever. SYMPTOMS  Mild dehydration  Thirst.  Dry lips.  Slightly dry mouth. Moderate dehydration  Very dry mouth.  Sunken eyes.  Skin does not bounce back quickly when lightly pinched and released.  Dark urine and decreased urine production.  Decreased tear production.  Headache. Severe dehydration  Very dry mouth.  Extreme thirst.  Rapid, weak pulse (more than 100 beats per minute at rest).  Cold hands and feet.  Not able to sweat in spite of heat and temperature.  Rapid breathing.  Blue lips.  Confusion and lethargy.  Difficulty being awakened.  Minimal urine production.  No tears. DIAGNOSIS  Your caregiver will diagnose dehydration based on your symptoms and your exam. Blood and urine tests will help confirm the diagnosis. The diagnostic evaluation should also identify the cause of dehydration. TREATMENT  Treatment of mild or moderate dehydration can often be done at home by increasing the amount of fluids that you drink. It is best to drink small amounts of fluid more often. Drinking too much at one time can make vomiting worse. Refer to the home care instructions below. Severe dehydration needs to be treated at the hospital where you will probably be given intravenous (IV) fluids that contain water and electrolytes. HOME CARE INSTRUCTIONS   Ask your caregiver about specific rehydration instructions.  Drink enough fluids to keep your urine clear or pale yellow.  Drink small amounts frequently if you have nausea and vomiting.  Eat as you normally do.  Avoid:  Foods or drinks high in sugar.  Carbonated  drinks.  Juice.  Extremely hot or cold fluids.  Drinks with caffeine.  Fatty, greasy foods.  Alcohol.  Tobacco.  Overeating.  Gelatin desserts.  Wash your hands well to avoid spreading bacteria and viruses.  Only take over-the-counter or prescription medicines for pain, discomfort, or fever as directed by your caregiver.  Ask your caregiver if you should continue all prescribed and over-the-counter medicines.  Keep all follow-up appointments with your caregiver. SEEK MEDICAL CARE IF:  You have abdominal pain and it increases or stays in one area (localizes).  You have a rash, stiff neck, or severe headache.  You are irritable, sleepy, or difficult to awaken.  You are weak, dizzy, or extremely thirsty. SEEK IMMEDIATE MEDICAL CARE IF:   You are unable to keep fluids down or you get worse despite treatment.  You have frequent episodes of vomiting or diarrhea.  You have blood or green matter (bile) in your vomit.  You have blood in your stool or your stool looks black and tarry.  You have not urinated in 6 to 8 hours, or you have only urinated a small amount of very dark urine.  You have a fever.  You faint. MAKE SURE YOU:   Understand these instructions.  Will watch your condition.  Will get help right away if you are not doing well or get worse. Document Released: 09/21/2005 Document Revised: 12/14/2011 Document Reviewed: 05/11/2011 ExitCare Patient Information 2015 [employer]. This information is not intended to replace advice given to you by your health care provider. Make sure you discuss any questions you have with your health care   provider.  

## 2014-07-27 ENCOUNTER — Ambulatory Visit (HOSPITAL_BASED_OUTPATIENT_CLINIC_OR_DEPARTMENT_OTHER): Payer: Medicare Other

## 2014-07-27 VITALS — BP 129/39 | HR 76 | Temp 98.4°F | Resp 18

## 2014-07-27 DIAGNOSIS — D46C Myelodysplastic syndrome with isolated del(5q) chromosomal abnormality: Secondary | ICD-10-CM

## 2014-07-27 DIAGNOSIS — R112 Nausea with vomiting, unspecified: Secondary | ICD-10-CM

## 2014-07-27 MED ORDER — SODIUM CHLORIDE 0.9 % IJ SOLN
10.0000 mL | INTRAMUSCULAR | Status: DC | PRN
  Administered 2014-07-27: 10 mL
  Filled 2014-07-27: qty 10

## 2014-07-27 MED ORDER — HEPARIN SOD (PORK) LOCK FLUSH 100 UNIT/ML IV SOLN
500.0000 [IU] | Freq: Once | INTRAVENOUS | Status: AC | PRN
  Administered 2014-07-27: 500 [IU]
  Filled 2014-07-27: qty 5

## 2014-07-27 MED ORDER — ONDANSETRON 8 MG/50ML IVPB (CHCC)
8.0000 mg | Freq: Every day | INTRAVENOUS | Status: DC | PRN
  Administered 2014-07-27: 8 mg via INTRAVENOUS

## 2014-07-27 MED ORDER — ONDANSETRON 8 MG/NS 50 ML IVPB
INTRAVENOUS | Status: AC
  Filled 2014-07-27: qty 8

## 2014-07-27 MED ORDER — SODIUM CHLORIDE 0.9 % IV SOLN
Freq: Once | INTRAVENOUS | Status: AC
  Administered 2014-07-27: 14:00:00 via INTRAVENOUS

## 2014-07-27 NOTE — Patient Instructions (Signed)
Dehydration, Adult Dehydration is when you lose more fluids from the body than you take in. Vital organs like the kidneys, brain, and heart cannot function without a proper amount of fluids and salt. Any loss of fluids from the body can cause dehydration.  CAUSES   Vomiting.  Diarrhea.  Excessive sweating.  Excessive urine output.  Fever. SYMPTOMS  Mild dehydration  Thirst.  Dry lips.  Slightly dry mouth. Moderate dehydration  Very dry mouth.  Sunken eyes.  Skin does not bounce back quickly when lightly pinched and released.  Dark urine and decreased urine production.  Decreased tear production.  Headache. Severe dehydration  Very dry mouth.  Extreme thirst.  Rapid, weak pulse (more than 100 beats per minute at rest).  Cold hands and feet.  Not able to sweat in spite of heat and temperature.  Rapid breathing.  Blue lips.  Confusion and lethargy.  Difficulty being awakened.  Minimal urine production.  No tears. DIAGNOSIS  Your caregiver will diagnose dehydration based on your symptoms and your exam. Blood and urine tests will help confirm the diagnosis. The diagnostic evaluation should also identify the cause of dehydration. TREATMENT  Treatment of mild or moderate dehydration can often be done at home by increasing the amount of fluids that you drink. It is best to drink small amounts of fluid more often. Drinking too much at one time can make vomiting worse. Refer to the home care instructions below. Severe dehydration needs to be treated at the hospital where you will probably be given intravenous (IV) fluids that contain water and electrolytes. HOME CARE INSTRUCTIONS   Ask your caregiver about specific rehydration instructions.  Drink enough fluids to keep your urine clear or pale yellow.  Drink small amounts frequently if you have nausea and vomiting.  Eat as you normally do.  Avoid:  Foods or drinks high in sugar.  Carbonated  drinks.  Juice.  Extremely hot or cold fluids.  Drinks with caffeine.  Fatty, greasy foods.  Alcohol.  Tobacco.  Overeating.  Gelatin desserts.  Wash your hands well to avoid spreading bacteria and viruses.  Only take over-the-counter or prescription medicines for pain, discomfort, or fever as directed by your caregiver.  Ask your caregiver if you should continue all prescribed and over-the-counter medicines.  Keep all follow-up appointments with your caregiver. SEEK MEDICAL CARE IF:  You have abdominal pain and it increases or stays in one area (localizes).  You have a rash, stiff neck, or severe headache.  You are irritable, sleepy, or difficult to awaken.  You are weak, dizzy, or extremely thirsty. SEEK IMMEDIATE MEDICAL CARE IF:   You are unable to keep fluids down or you get worse despite treatment.  You have frequent episodes of vomiting or diarrhea.  You have blood or green matter (bile) in your vomit.  You have blood in your stool or your stool looks black and tarry.  You have not urinated in 6 to 8 hours, or you have only urinated a small amount of very dark urine.  You have a fever.  You faint. MAKE SURE YOU:   Understand these instructions.  Will watch your condition.  Will get help right away if you are not doing well or get worse. Document Released: 09/21/2005 Document Revised: 12/14/2011 Document Reviewed: 05/11/2011 ExitCare Patient Information 2015 [employer]. This information is not intended to replace advice given to you by your health care provider. Make sure you discuss any questions you have with your health care   provider.  

## 2014-07-30 ENCOUNTER — Other Ambulatory Visit (HOSPITAL_BASED_OUTPATIENT_CLINIC_OR_DEPARTMENT_OTHER): Payer: Medicare Other

## 2014-07-30 ENCOUNTER — Other Ambulatory Visit: Payer: Self-pay | Admitting: *Deleted

## 2014-07-30 ENCOUNTER — Ambulatory Visit (HOSPITAL_BASED_OUTPATIENT_CLINIC_OR_DEPARTMENT_OTHER): Payer: Medicare Other

## 2014-07-30 VITALS — BP 122/59 | HR 78 | Temp 97.2°F | Resp 20

## 2014-07-30 DIAGNOSIS — D630 Anemia in neoplastic disease: Secondary | ICD-10-CM

## 2014-07-30 DIAGNOSIS — D469 Myelodysplastic syndrome, unspecified: Secondary | ICD-10-CM

## 2014-07-30 DIAGNOSIS — D46C Myelodysplastic syndrome with isolated del(5q) chromosomal abnormality: Secondary | ICD-10-CM

## 2014-07-30 LAB — CBC WITH DIFFERENTIAL/PLATELET
BASO%: 0.3 % (ref 0.0–2.0)
BASOS ABS: 0 10*3/uL (ref 0.0–0.1)
EOS%: 6.8 % (ref 0.0–7.0)
Eosinophils Absolute: 0.6 10*3/uL — ABNORMAL HIGH (ref 0.0–0.5)
HEMATOCRIT: 22.5 % — AB (ref 34.8–46.6)
HGB: 7.5 g/dL — ABNORMAL LOW (ref 11.6–15.9)
LYMPH%: 5.6 % — ABNORMAL LOW (ref 14.0–49.7)
MCH: 27.6 pg (ref 25.1–34.0)
MCHC: 33.5 g/dL (ref 31.5–36.0)
MCV: 82.6 fL (ref 79.5–101.0)
MONO#: 0.3 10*3/uL (ref 0.1–0.9)
MONO%: 3.1 % (ref 0.0–14.0)
NEUT#: 7 10*3/uL — ABNORMAL HIGH (ref 1.5–6.5)
NEUT%: 84.2 % — AB (ref 38.4–76.8)
RBC: 2.73 10*6/uL — ABNORMAL LOW (ref 3.70–5.45)
RDW: 14.4 % (ref 11.2–14.5)
WBC: 8.3 10*3/uL (ref 3.9–10.3)
lymph#: 0.5 10*3/uL — ABNORMAL LOW (ref 0.9–3.3)

## 2014-07-30 LAB — HOLD TUBE, BLOOD BANK

## 2014-07-30 LAB — TECHNOLOGIST REVIEW

## 2014-07-30 MED ORDER — FUROSEMIDE 10 MG/ML IJ SOLN
20.0000 mg | Freq: Once | INTRAMUSCULAR | Status: AC
  Administered 2014-07-30: 14:00:00 via INTRAVENOUS

## 2014-07-30 MED ORDER — SODIUM CHLORIDE 0.9 % IV SOLN
250.0000 mL | Freq: Once | INTRAVENOUS | Status: AC
  Administered 2014-07-30: 250 mL via INTRAVENOUS

## 2014-07-30 MED ORDER — DIPHENHYDRAMINE HCL 25 MG PO CAPS
ORAL_CAPSULE | ORAL | Status: AC
  Filled 2014-07-30: qty 1

## 2014-07-30 MED ORDER — ACETAMINOPHEN 325 MG PO TABS
ORAL_TABLET | ORAL | Status: AC
  Filled 2014-07-30: qty 2

## 2014-07-30 MED ORDER — ACETAMINOPHEN 325 MG PO TABS
650.0000 mg | ORAL_TABLET | Freq: Once | ORAL | Status: AC
  Administered 2014-07-30: 650 mg via ORAL

## 2014-07-30 MED ORDER — DIPHENHYDRAMINE HCL 25 MG PO CAPS
25.0000 mg | ORAL_CAPSULE | Freq: Once | ORAL | Status: AC
  Administered 2014-07-30: 25 mg via ORAL

## 2014-07-31 LAB — TYPE AND SCREEN
ABO/RH(D): A POS
Antibody Screen: NEGATIVE
UNIT DIVISION: 0
Unit division: 0

## 2014-08-02 ENCOUNTER — Telehealth: Payer: Self-pay | Admitting: *Deleted

## 2014-08-02 NOTE — Telephone Encounter (Signed)
Rebekah Cross asks if Dr. Alvy Bimler will send Rx for diabetes test strips to [facility] as they cost almost $50 at [facility]. She hopes they will be less expensive at [facility].   Rx is from Dr. Brigitte Pulse.  Daughter was under impression that Dr. Brigitte Pulse could not order from [facility].  Informed her she can rx to [facility] just like any other pharmacy.  Called Dr. Raul Del office ph [phone] to ask them to send Rx to [facility] to see if any less expensive as they are the ordering provider.   Spoke w/ [name] at Dr. Raul Del office.  She will contact pt's daughter about the test strips.

## 2014-08-06 ENCOUNTER — Other Ambulatory Visit (HOSPITAL_BASED_OUTPATIENT_CLINIC_OR_DEPARTMENT_OTHER): Payer: Medicare Other

## 2014-08-06 DIAGNOSIS — D46C Myelodysplastic syndrome with isolated del(5q) chromosomal abnormality: Secondary | ICD-10-CM

## 2014-08-06 LAB — CBC WITH DIFFERENTIAL/PLATELET
BASO%: 0 % (ref 0.0–2.0)
BASOS ABS: 0 10*3/uL (ref 0.0–0.1)
EOS%: 8.6 % — ABNORMAL HIGH (ref 0.0–7.0)
Eosinophils Absolute: 0.6 10*3/uL — ABNORMAL HIGH (ref 0.0–0.5)
HEMATOCRIT: 25.7 % — AB (ref 34.8–46.6)
HEMOGLOBIN: 8.6 g/dL — AB (ref 11.6–15.9)
LYMPH#: 0.5 10*3/uL — AB (ref 0.9–3.3)
LYMPH%: 6.6 % — AB (ref 14.0–49.7)
MCH: 28.2 pg (ref 25.1–34.0)
MCHC: 33.3 g/dL (ref 31.5–36.0)
MCV: 84.8 fL (ref 79.5–101.0)
MONO#: 0.2 10*3/uL (ref 0.1–0.9)
MONO%: 3 % (ref 0.0–14.0)
NEUT#: 6.1 10*3/uL (ref 1.5–6.5)
NEUT%: 81.8 % — ABNORMAL HIGH (ref 38.4–76.8)
PLATELETS: 91 10*3/uL — AB (ref 145–400)
RBC: 3.03 10*6/uL — ABNORMAL LOW (ref 3.70–5.45)
RDW: 14.6 % — ABNORMAL HIGH (ref 11.2–14.5)
WBC: 7.4 10*3/uL (ref 3.9–10.3)

## 2014-08-06 LAB — TECHNOLOGIST REVIEW

## 2014-08-06 LAB — HOLD TUBE, BLOOD BANK

## 2014-08-13 ENCOUNTER — Other Ambulatory Visit (HOSPITAL_BASED_OUTPATIENT_CLINIC_OR_DEPARTMENT_OTHER): Payer: Medicare Other

## 2014-08-13 ENCOUNTER — Ambulatory Visit (HOSPITAL_BASED_OUTPATIENT_CLINIC_OR_DEPARTMENT_OTHER): Payer: Medicare Other

## 2014-08-13 ENCOUNTER — Telehealth: Payer: Self-pay | Admitting: Hematology and Oncology

## 2014-08-13 ENCOUNTER — Ambulatory Visit (HOSPITAL_BASED_OUTPATIENT_CLINIC_OR_DEPARTMENT_OTHER): Payer: Medicare Other | Admitting: Hematology and Oncology

## 2014-08-13 ENCOUNTER — Ambulatory Visit (HOSPITAL_COMMUNITY)
Admission: RE | Admit: 2014-08-13 | Discharge: 2014-08-13 | Disposition: A | Discharge status: Home / Self Care | Payer: Medicare Other | Source: Ambulatory Visit | Attending: Hematology and Oncology | Admitting: Hematology and Oncology

## 2014-08-13 ENCOUNTER — Other Ambulatory Visit: Payer: Self-pay | Admitting: Hematology and Oncology

## 2014-08-13 VITALS — BP 121/38 | HR 91 | Temp 97.5°F | Resp 18 | Ht 60.0 in | Wt 124.1 lb

## 2014-08-13 VITALS — BP 136/51 | HR 80 | Temp 99.0°F | Resp 19

## 2014-08-13 DIAGNOSIS — D46C Myelodysplastic syndrome with isolated del(5q) chromosomal abnormality: Secondary | ICD-10-CM | POA: Diagnosis not present

## 2014-08-13 DIAGNOSIS — D630 Anemia in neoplastic disease: Secondary | ICD-10-CM | POA: Insufficient documentation

## 2014-08-13 DIAGNOSIS — T50905A Adverse effect of unspecified drugs, medicaments and biological substances, initial encounter: Secondary | ICD-10-CM

## 2014-08-13 DIAGNOSIS — R112 Nausea with vomiting, unspecified: Secondary | ICD-10-CM

## 2014-08-13 DIAGNOSIS — D6959 Other secondary thrombocytopenia: Secondary | ICD-10-CM

## 2014-08-13 DIAGNOSIS — D469 Myelodysplastic syndrome, unspecified: Secondary | ICD-10-CM

## 2014-08-13 LAB — CBC WITH DIFFERENTIAL/PLATELET
BASO%: 1.2 % (ref 0.0–2.0)
Basophils Absolute: 0.1 10*3/uL (ref 0.0–0.1)
EOS ABS: 0.7 10*3/uL — AB (ref 0.0–0.5)
EOS%: 8.2 % — ABNORMAL HIGH (ref 0.0–7.0)
HCT: 18.8 % — ABNORMAL LOW (ref 34.8–46.6)
HEMOGLOBIN: 6.3 g/dL — AB (ref 11.6–15.9)
LYMPH#: 0.7 10*3/uL — AB (ref 0.9–3.3)
LYMPH%: 8.3 % — ABNORMAL LOW (ref 14.0–49.7)
MCH: 28 pg (ref 25.1–34.0)
MCHC: 33.5 g/dL (ref 31.5–36.0)
MCV: 83.6 fL (ref 79.5–101.0)
MONO#: 0.6 10*3/uL (ref 0.1–0.9)
MONO%: 7.3 % (ref 0.0–14.0)
NEUT%: 75 % (ref 38.4–76.8)
NEUTROS ABS: 6.6 10*3/uL — AB (ref 1.5–6.5)
Platelets: 135 10*3/uL — ABNORMAL LOW (ref 145–400)
RBC: 2.25 10*6/uL — AB (ref 3.70–5.45)
RDW: 14.3 % (ref 11.2–14.5)
WBC: 8.7 10*3/uL (ref 3.9–10.3)

## 2014-08-13 LAB — HOLD TUBE, BLOOD BANK

## 2014-08-13 LAB — TECHNOLOGIST REVIEW

## 2014-08-13 MED ORDER — PREDNISONE 10 MG PO TABS: 10.0000 mg | ORAL_TABLET | Freq: Every day | ORAL | Status: DC

## 2014-08-13 MED ORDER — FUROSEMIDE 10 MG/ML IJ SOLN
20.0000 mg | Freq: Once | INTRAMUSCULAR | Status: AC
  Administered 2014-08-13: 20 mg via INTRAVENOUS

## 2014-08-13 MED ORDER — HEPARIN SOD (PORK) LOCK FLUSH 100 UNIT/ML IV SOLN
500.0000 [IU] | Freq: Every day | INTRAVENOUS | Status: AC | PRN
  Administered 2014-08-13: 500 [IU]
  Filled 2014-08-13: qty 5

## 2014-08-13 MED ORDER — DIPHENHYDRAMINE HCL 25 MG PO CAPS
ORAL_CAPSULE | ORAL | Status: AC
Start: 2014-08-13 — End: 2014-08-13
  Filled 2014-08-13: qty 1

## 2014-08-13 MED ORDER — ACETAMINOPHEN 325 MG PO TABS
ORAL_TABLET | ORAL | Status: AC
  Filled 2014-08-13: qty 2

## 2014-08-13 MED ORDER — SODIUM CHLORIDE 0.9 % IJ SOLN
10.0000 mL | INTRAMUSCULAR | Status: AC | PRN
  Administered 2014-08-13: 10 mL
  Filled 2014-08-13: qty 10

## 2014-08-13 MED ORDER — SODIUM CHLORIDE 0.9 % IV SOLN
250.0000 mL | Freq: Once | INTRAVENOUS | Status: AC
  Administered 2014-08-13: 250 mL via INTRAVENOUS

## 2014-08-13 MED ORDER — DIPHENHYDRAMINE HCL 25 MG PO CAPS
25.0000 mg | ORAL_CAPSULE | Freq: Once | ORAL | Status: AC
  Administered 2014-08-13: 25 mg via ORAL

## 2014-08-13 MED ORDER — ACETAMINOPHEN 325 MG PO TABS
650.0000 mg | ORAL_TABLET | Freq: Once | ORAL | Status: AC
  Administered 2014-08-13: 650 mg via ORAL

## 2014-08-13 NOTE — Assessment & Plan Note (Signed)
Overall, she tolerated treatment very poorly. I recommend she continue to hold chemotherapy and continue supportive care only. She will also continue on 10 mg of prednisone daily.

## 2014-08-13 NOTE — Progress Notes (Signed)
[place] OFFICE PROGRESS NOTE  Patient Care Team: Mayra Neer, MD as PCP - General (Family Medicine) Heath Lark, MD as Consulting Physician (Hematology and Oncology)  SUMMARY OF ONCOLOGIC HISTORY:  This is a very pleasant 80 year old lady who become transfusion dependent since 2014. She denies any bleeding such as epistaxis, hematuria, or hematochezia. 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 Erythropoietin stimulating agents were stopped when her erythropoietin level came back over 500 In October 2014, she was started on Revlimid however develop severe allergic reaction to Revlimid. That was discontinued. On 08/07/2013, we started her on Vidaza for 2 cycles. On 10/04/2013, repeat a bone marrow aspirate and biopsy showed persistent disease. Additional testing revealed that her disease is JAK 2 positive On 11/06/2013, Vidaza is resumed after her second opinion from [facility] On 02/08/2014, repeat bone marrow aspirate and biopsy showed no evidence of increased blasts On 04/16/2014, Vidaza is discontinued due to increased transfusion requirement and progressive weakness On 04/30/14: she was started on 100 mg danazol On 05/21/14: Danazol is increased to 200 mg daily. On 06/18/2014, danazol was discontinued. On 06/25/14: Shanon Brow is added On 07/16/2014, Jakafi is on hold due to severe pancytopenia  INTERVAL HISTORY: Please see below for problem oriented charting. Her abdominal pain has resolved. Nausea and vomiting has resolved. She denies recent worsening skin rash when her prednisone was tapered to 10 mg daily.  REVIEW OF SYSTEMS:   Constitutional: Denies fevers, chills or abnormal weight loss Eyes: Denies blurriness of vision Ears, nose, mouth, throat, and face: Denies mucositis or sore throat Respiratory: Denies cough, dyspnea or wheezes Cardiovascular: Denies  palpitation, chest discomfort or lower extremity swelling Gastrointestinal:  Denies nausea, heartburn or change in bowel habits Skin: Denies abnormal skin rashes Lymphatics: Denies new lymphadenopathy or easy bruising Neurological:Denies numbness, tingling or new weaknesses Behavioral/Psych: Mood is stable, no new changes  All other systems were reviewed with the patient and are negative.  I have reviewed the past medical history, past surgical history, social history and family history with the patient and they are unchanged from previous note.  ALLERGIES:  is allergic to novocain; oxycodone; codeine; methimazole; and revlimid.  MEDICATIONS:  Current Outpatient Prescriptions  Medication Sig Dispense Refill  . acetaminophen (TYLENOL) 500 MG tablet Take 1,000 mg by mouth every 6 (six) hours as needed (Pain).     Marland Kitchen desoximetasone (TOPICORT) 0.25 % cream Apply 1 application topically 2 (two) times daily as needed. For rash    . hydrOXYzine (VISTARIL) 25 MG capsule Take 1 capsule (25 mg total) by mouth every 4 (four) hours as needed. 90 capsule 3  . insulin aspart (NOVOLOG) 100 UNIT/ML injection Inject 15 Units into the skin 3 (three) times daily before meals. For Blood sugar over 200    . insulin detemir (LEVEMIR) 100 UNIT/ML injection Inject 12 Units into the skin daily.     Marland Kitchen lidocaine-prilocaine (EMLA) cream     . LORazepam (ATIVAN) 0.5 MG tablet Take 0.5 mg by mouth at bedtime.     . metFORMIN (GLUCOPHAGE) 500 MG tablet Take 1 tablet (500 mg total) by mouth 2 (two) times daily with a meal. 60 tablet 1  . metoprolol succinate (TOPROL-XL) 100 MG 24 hr tablet Take 50 mg by mouth every morning.     . mometasone (NASONEX) 50 MCG/ACT nasal spray Place 2 sprays into the nose 2 (two) times daily.    Marland Kitchen  Multiple Vitamin (MULTIVITAMIN) tablet Take 1 tablet by mouth every morning.     . nisoldipine (SULAR) 17 MG 24 hr tablet Take 17 mg by mouth daily.    Marland Kitchen olmesartan-hydrochlorothiazide (BENICAR HCT)  40-25 MG per tablet Take 1 tablet by mouth daily.    Marland Kitchen omeprazole (PRILOSEC) 40 MG capsule Take 1 capsule (40 mg total) by mouth daily. 90 capsule 3  . ondansetron (ZOFRAN) 8 MG tablet Take 1 tablet (8 mg total) by mouth 2 (two) times daily as needed for nausea or vomiting. 60 tablet 1  . potassium chloride SA (K-DUR,KLOR-CON) 20 MEQ tablet Take 1 tablet (20 mEq total) by mouth 2 (two) times daily. 60 tablet 1  . predniSONE (DELTASONE) 10 MG tablet Take 1 tablet (10 mg total) by mouth daily with breakfast. 60 tablet 3  . prochlorperazine (COMPAZINE) 10 MG tablet     . ranitidine (ZANTAC) 75 MG tablet Take 1 tablet (75 mg total) by mouth at bedtime. 30 tablet 6  . traMADol (ULTRAM) 50 MG tablet     . mometasone (ELOCON) 0.1 % cream Apply 1 application topically daily as needed. Rash    . scopolamine (TRANSDERM-SCOP) 1 MG/3DAYS Place 1 patch (1.5 mg total) onto the skin every 3 (three) days. 10 patch 12  . triamcinolone cream (KENALOG) 0.1 %      No current facility-administered medications for this visit.   Facility-Administered Medications Ordered in Other Visits  Medication Dose Route Frequency Provider Last Rate Last Dose  . sodium chloride 0.9 % injection 3 mL  3 mL Intracatheter PRN Heath Lark, MD   3 mL at 10/25/13 1407    PHYSICAL EXAMINATION: ECOG PERFORMANCE STATUS: 1 - Symptomatic but completely ambulatory  Filed Vitals:   08/13/14 0952  BP: 121/38  Pulse: 91  Temp: 97.5 F (36.4 C)  Resp: 18   Filed Weights   08/13/14 0952  Weight: 124 lb 1.6 oz (56.291 kg)    GENERAL:alert, no distress and comfortable SKIN: skin color is pale, texture, turgor are normal, no rashes or significant lesions EYES: normal, Conjunctiva are paleand non-injected, sclera clear OROPHARYNX:no exudate, no erythema and lips, buccal mucosa, and tongue normal  NECK: supple, thyroid normal size, non-tender, without nodularity LYMPH:  no palpable lymphadenopathy in the cervical, axillary or  inguinal LUNGS: clear to auscultation and percussion with normal breathing effort HEART: regular rate & rhythm and no murmurs and no lower extremity edema ABDOMEN:abdomen soft, non-tender and normal bowel sounds Musculoskeletal:no cyanosis of digits and no clubbing  NEURO: alert & oriented x 3 with fluent speech, no focal motor/sensory deficits  LABORATORY DATA:  I have reviewed the data as listed    Component Value Date/Time   NA 142 07/23/2014 0920   NA 134* 04/19/2014 1940   K 3.5 07/23/2014 0920   K 4.7 04/19/2014 1940   CL 92* 04/19/2014 1940   CO2 27 07/23/2014 0920   CO2 25 04/19/2014 1940   GLUCOSE 131 07/23/2014 0920   GLUCOSE 398* 04/19/2014 1940   BUN 23.6 07/23/2014 0920   BUN 42* 04/19/2014 1940   CREATININE 1.0 07/23/2014 0920   CREATININE 0.80 04/19/2014 1940   CALCIUM 9.3 07/23/2014 0920   CALCIUM 9.8 04/19/2014 1940   PROT 6.0* 07/23/2014 0920   PROT 6.6 04/19/2014 1940   ALBUMIN 3.2* 07/23/2014 0920   ALBUMIN 3.0* 04/19/2014 1940   AST 20 07/23/2014 0920   AST 12 04/19/2014 1940   ALT 47 07/23/2014 0920   ALT 20 04/19/2014  1940   ALKPHOS 42 07/23/2014 0920   ALKPHOS 60 04/19/2014 1940   BILITOT 1.34* 07/23/2014 0920   BILITOT 0.6 04/19/2014 1940   GFRNONAA 65* 04/19/2014 1940   GFRAA 75* 04/19/2014 1940    No results found for: SPEP, UPEP  Lab Results  Component Value Date   WBC 8.7 08/13/2014   NEUTROABS 6.6* 08/13/2014   HGB 6.3* 08/13/2014   HCT 18.8* 08/13/2014   MCV 83.6 08/13/2014   PLT 135* 08/13/2014      Chemistry      Component Value Date/Time   NA 142 07/23/2014 0920   NA 134* 04/19/2014 1940   K 3.5 07/23/2014 0920   K 4.7 04/19/2014 1940   CL 92* 04/19/2014 1940   CO2 27 07/23/2014 0920   CO2 25 04/19/2014 1940   BUN 23.6 07/23/2014 0920   BUN 42* 04/19/2014 1940   CREATININE 1.0 07/23/2014 0920   CREATININE 0.80 04/19/2014 1940   GLU 199* 04/23/2014 0910      Component Value Date/Time   CALCIUM 9.3 07/23/2014 0920    CALCIUM 9.8 04/19/2014 1940   ALKPHOS 42 07/23/2014 0920   ALKPHOS 60 04/19/2014 1940   AST 20 07/23/2014 0920   AST 12 04/19/2014 1940   ALT 47 07/23/2014 0920   ALT 20 04/19/2014 1940   BILITOT 1.34* 07/23/2014 0920   BILITOT 0.6 04/19/2014 1940     ASSESSMENT & PLAN:  MDS (myelodysplastic syndrome) with 5q deletion Overall, she tolerated treatment very poorly. I recommend she continue to hold chemotherapy and continue supportive care only. She will also continue on 10 mg of prednisone daily.  Anemia in neoplastic disease We discussed some of the risks, benefits, and alternatives of blood transfusions. The patient is symptomatic from anemia and the hemoglobin level is critically low.  Some of the side-effects to be expected including risks of transfusion reactions, chills, infection, syndrome of volume overload and risk of hospitalization from various reasons and the patient is willing to proceed and went ahead to sign consent today.   Moderate nausea and vomiting Thankfully, this has resolved.  Thrombocytopenia due to drugs This has improved since we hold her chemotherapy. Recommend continue observation.   Orders Placed This Encounter  Procedures  . CBC with Differential    Standing Status: Future     Number of Occurrences: 1     Standing Expiration Date: 08/13/2015  . CBC with Differential    Standing Status: Standing     Number of Occurrences: 33     Standing Expiration Date: 08/14/2015   All questions were answered. The patient knows to call the clinic with any problems, questions or concerns. No barriers to learning was detected. I spent 30 minutes counseling the patient face to face. The total time spent in the appointment was 40 minutes and more than 50% was on counseling and review of test results     [facility], [place], MD 08/13/2014 4:56 PM

## 2014-08-13 NOTE — Assessment & Plan Note (Signed)
Thankfully, this has resolved.

## 2014-08-13 NOTE — Telephone Encounter (Signed)
Pt confirmed labs/ov per 11/09 POF, gave pt AVS ..... KJ

## 2014-08-13 NOTE — Assessment & Plan Note (Signed)
This has improved since we hold her chemotherapy. Recommend continue observation. 

## 2014-08-13 NOTE — Patient Instructions (Signed)

## 2014-08-13 NOTE — Assessment & Plan Note (Signed)
We discussed some of the risks, benefits, and alternatives of blood transfusions. The patient is symptomatic from anemia and the hemoglobin level is critically low.  Some of the side-effects to be expected including risks of transfusion reactions, chills, infection, syndrome of volume overload and risk of hospitalization from various reasons and the patient is willing to proceed and went ahead to sign consent today.  

## 2014-08-14 LAB — TYPE AND SCREEN
ABO/RH(D): A POS
ANTIBODY SCREEN: NEGATIVE
Unit division: 0
Unit division: 0

## 2014-08-14 LAB — PREPARE RBC (CROSSMATCH)

## 2014-08-16 ENCOUNTER — Ambulatory Visit (HOSPITAL_BASED_OUTPATIENT_CLINIC_OR_DEPARTMENT_OTHER): Payer: Medicare Other

## 2014-08-16 ENCOUNTER — Other Ambulatory Visit: Payer: Self-pay | Admitting: Hematology and Oncology

## 2014-08-16 ENCOUNTER — Telehealth: Payer: Self-pay | Admitting: *Deleted

## 2014-08-16 ENCOUNTER — Telehealth: Payer: Self-pay | Admitting: Hematology and Oncology

## 2014-08-16 DIAGNOSIS — R112 Nausea with vomiting, unspecified: Secondary | ICD-10-CM

## 2014-08-16 DIAGNOSIS — R110 Nausea: Secondary | ICD-10-CM

## 2014-08-16 MED ORDER — ONDANSETRON 8 MG/50ML IVPB (CHCC)
8.0000 mg | Freq: Every day | INTRAVENOUS | Status: DC | PRN
Start: 2014-08-16 — End: 2014-08-16
  Administered 2014-08-16: 8 mg via INTRAVENOUS

## 2014-08-16 MED ORDER — HEPARIN SOD (PORK) LOCK FLUSH 100 UNIT/ML IV SOLN
500.0000 [IU] | Freq: Once | INTRAVENOUS | Status: AC | PRN
  Administered 2014-08-16: 500 [IU]
  Filled 2014-08-16: qty 5

## 2014-08-16 MED ORDER — SODIUM CHLORIDE 0.9 % IV SOLN
Freq: Once | INTRAVENOUS | Status: AC
  Administered 2014-08-16: 15:00:00 via INTRAVENOUS

## 2014-08-16 MED ORDER — SODIUM CHLORIDE 0.9 % IJ SOLN
10.0000 mL | INTRAMUSCULAR | Status: DC | PRN
  Administered 2014-08-16: 10 mL
  Filled 2014-08-16: qty 10

## 2014-08-16 MED ORDER — ONDANSETRON 8 MG/NS 50 ML IVPB
INTRAVENOUS | Status: AC
  Filled 2014-08-16: qty 8

## 2014-08-16 NOTE — Telephone Encounter (Signed)
Informed daughter of IVFs today at 2:30 pm and will be scheduled for Friday and Saturday as well.  Please call us on Monday with update on condition.  She verbalized understanding.

## 2014-08-16 NOTE — Telephone Encounter (Signed)
s.w. pt and advised on today 11.13 and 11.14 appts.Marland KitchenMarland KitchenMarland KitchenMarland Kitchenpt ok and aware

## 2014-08-16 NOTE — Patient Instructions (Signed)
Dehydration, Adult Dehydration is when you lose more fluids from the body than you take in. Vital organs like the kidneys, brain, and heart cannot function without a proper amount of fluids and salt. Any loss of fluids from the body can cause dehydration.  CAUSES   Vomiting.  Diarrhea.  Excessive sweating.  Excessive urine output.  Fever. SYMPTOMS  Mild dehydration  Thirst.  Dry lips.  Slightly dry mouth. Moderate dehydration  Very dry mouth.  Sunken eyes.  Skin does not bounce back quickly when lightly pinched and released.  Dark urine and decreased urine production.  Decreased tear production.  Headache. Severe dehydration  Very dry mouth.  Extreme thirst.  Rapid, weak pulse (more than 100 beats per minute at rest).  Cold hands and feet.  Not able to sweat in spite of heat and temperature.  Rapid breathing.  Blue lips.  Confusion and lethargy.  Difficulty being awakened.  Minimal urine production.  No tears. DIAGNOSIS  Your caregiver will diagnose dehydration based on your symptoms and your exam. Blood and urine tests will help confirm the diagnosis. The diagnostic evaluation should also identify the cause of dehydration. TREATMENT  Treatment of mild or moderate dehydration can often be done at home by increasing the amount of fluids that you drink. It is best to drink small amounts of fluid more often. Drinking too much at one time can make vomiting worse. Refer to the home care instructions below. Severe dehydration needs to be treated at the hospital where you will probably be given intravenous (IV) fluids that contain water and electrolytes. HOME CARE INSTRUCTIONS   Ask your caregiver about specific rehydration instructions.  Drink enough fluids to keep your urine clear or pale yellow.  Drink small amounts frequently if you have nausea and vomiting.  Eat as you normally do.  Avoid:  Foods or drinks high in sugar.  Carbonated  drinks.  Juice.  Extremely hot or cold fluids.  Drinks with caffeine.  Fatty, greasy foods.  Alcohol.  Tobacco.  Overeating.  Gelatin desserts.  Wash your hands well to avoid spreading bacteria and viruses.  Only take over-the-counter or prescription medicines for pain, discomfort, or fever as directed by your caregiver.  Ask your caregiver if you should continue all prescribed and over-the-counter medicines.  Keep all follow-up appointments with your caregiver. SEEK MEDICAL CARE IF:  You have abdominal pain and it increases or stays in one area (localizes).  You have a rash, stiff neck, or severe headache.  You are irritable, sleepy, or difficult to awaken.  You are weak, dizzy, or extremely thirsty. SEEK IMMEDIATE MEDICAL CARE IF:   You are unable to keep fluids down or you get worse despite treatment.  You have frequent episodes of vomiting or diarrhea.  You have blood or green matter (bile) in your vomit.  You have blood in your stool or your stool looks black and tarry.  You have not urinated in 6 to 8 hours, or you have only urinated a small amount of very dark urine.  You have a fever.  You faint. MAKE SURE YOU:   Understand these instructions.  Will watch your condition.  Will get help right away if you are not doing well or get worse. Document Released: 09/21/2005 Document Revised: 12/14/2011 Document Reviewed: 05/11/2011 ExitCare Patient Information 2015 [employer]. This information is not intended to replace advice given to you by your health care provider. Make sure you discuss any questions you have with your health care   provider.  

## 2014-08-16 NOTE — Telephone Encounter (Signed)
Daughter reports pt nauseated this week.  Taking her anti emetics ATC but still not able to eat much. States pt "red around the eyes" and has some new bruising.  Asks if she should bring her in for IVFs?

## 2014-08-16 NOTE — Telephone Encounter (Signed)
I recommend 3 days this week and advise daughter to call next week for update

## 2014-08-17 ENCOUNTER — Ambulatory Visit (HOSPITAL_BASED_OUTPATIENT_CLINIC_OR_DEPARTMENT_OTHER): Payer: Medicare Other

## 2014-08-17 DIAGNOSIS — R112 Nausea with vomiting, unspecified: Secondary | ICD-10-CM

## 2014-08-17 MED ORDER — SODIUM CHLORIDE 0.9 % IV SOLN
Freq: Once | INTRAVENOUS | Status: AC
  Administered 2014-08-17: 13:00:00 via INTRAVENOUS

## 2014-08-17 MED ORDER — HEPARIN SOD (PORK) LOCK FLUSH 100 UNIT/ML IV SOLN
500.0000 [IU] | Freq: Once | INTRAVENOUS | Status: AC | PRN
  Administered 2014-08-17: 500 [IU]
  Filled 2014-08-17: qty 5

## 2014-08-17 MED ORDER — SODIUM CHLORIDE 0.9 % IJ SOLN
10.0000 mL | INTRAMUSCULAR | Status: DC | PRN
  Administered 2014-08-17: 10 mL
  Filled 2014-08-17: qty 10

## 2014-08-17 MED ORDER — PROMETHAZINE HCL 25 MG/ML IJ SOLN
12.5000 mg | Freq: Every day | INTRAMUSCULAR | Status: DC | PRN
  Filled 2014-08-17: qty 1

## 2014-08-17 MED ORDER — ONDANSETRON 8 MG/NS 50 ML IVPB
INTRAVENOUS | Status: AC
  Filled 2014-08-17: qty 8

## 2014-08-17 MED ORDER — ONDANSETRON 8 MG/50ML IVPB (CHCC)
8.0000 mg | Freq: Every day | INTRAVENOUS | Status: DC | PRN
  Administered 2014-08-17: 8 mg via INTRAVENOUS

## 2014-08-17 NOTE — Patient Instructions (Signed)
Dehydration, Adult Dehydration is when you lose more fluids from the body than you take in. Vital organs like the kidneys, brain, and heart cannot function without a proper amount of fluids and salt. Any loss of fluids from the body can cause dehydration.  CAUSES   Vomiting.  Diarrhea.  Excessive sweating.  Excessive urine output.  Fever. SYMPTOMS  Mild dehydration  Thirst.  Dry lips.  Slightly dry mouth. Moderate dehydration  Very dry mouth.  Sunken eyes.  Skin does not bounce back quickly when lightly pinched and released.  Dark urine and decreased urine production.  Decreased tear production.  Headache. Severe dehydration  Very dry mouth.  Extreme thirst.  Rapid, weak pulse (more than 100 beats per minute at rest).  Cold hands and feet.  Not able to sweat in spite of heat and temperature.  Rapid breathing.  Blue lips.  Confusion and lethargy.  Difficulty being awakened.  Minimal urine production.  No tears. DIAGNOSIS  Your caregiver will diagnose dehydration based on your symptoms and your exam. Blood and urine tests will help confirm the diagnosis. The diagnostic evaluation should also identify the cause of dehydration. TREATMENT  Treatment of mild or moderate dehydration can often be done at home by increasing the amount of fluids that you drink. It is best to drink small amounts of fluid more often. Drinking too much at one time can make vomiting worse. Refer to the home care instructions below. Severe dehydration needs to be treated at the hospital where you will probably be given intravenous (IV) fluids that contain water and electrolytes. HOME CARE INSTRUCTIONS   Ask your caregiver about specific rehydration instructions.  Drink enough fluids to keep your urine clear or pale yellow.  Drink small amounts frequently if you have nausea and vomiting.  Eat as you normally do.  Avoid:  Foods or drinks high in sugar.  Carbonated  drinks.  Juice.  Extremely hot or cold fluids.  Drinks with caffeine.  Fatty, greasy foods.  Alcohol.  Tobacco.  Overeating.  Gelatin desserts.  Wash your hands well to avoid spreading bacteria and viruses.  Only take over-the-counter or prescription medicines for pain, discomfort, or fever as directed by your caregiver.  Ask your caregiver if you should continue all prescribed and over-the-counter medicines.  Keep all follow-up appointments with your caregiver. SEEK MEDICAL CARE IF:  You have abdominal pain and it increases or stays in one area (localizes).  You have a rash, stiff neck, or severe headache.  You are irritable, sleepy, or difficult to awaken.  You are weak, dizzy, or extremely thirsty. SEEK IMMEDIATE MEDICAL CARE IF:   You are unable to keep fluids down or you get worse despite treatment.  You have frequent episodes of vomiting or diarrhea.  You have blood or green matter (bile) in your vomit.  You have blood in your stool or your stool looks black and tarry.  You have not urinated in 6 to 8 hours, or you have only urinated a small amount of very dark urine.  You have a fever.  You faint. MAKE SURE YOU:   Understand these instructions.  Will watch your condition.  Will get help right away if you are not doing well or get worse. Document Released: 09/21/2005 Document Revised: 12/14/2011 Document Reviewed: 05/11/2011 ExitCare Patient Information 2015 [employer]. This information is not intended to replace advice given to you by your health care provider. Make sure you discuss any questions you have with your health care   provider.  

## 2014-08-18 ENCOUNTER — Ambulatory Visit (HOSPITAL_BASED_OUTPATIENT_CLINIC_OR_DEPARTMENT_OTHER): Payer: Medicare Other

## 2014-08-18 DIAGNOSIS — R112 Nausea with vomiting, unspecified: Secondary | ICD-10-CM

## 2014-08-18 MED ORDER — ONDANSETRON 8 MG/50ML IVPB (CHCC)
8.0000 mg | Freq: Every day | INTRAVENOUS | Status: DC | PRN
  Administered 2014-08-18: 8 mg via INTRAVENOUS

## 2014-08-18 MED ORDER — SODIUM CHLORIDE 0.9 % IV SOLN
1000.0000 mL | Freq: Once | INTRAVENOUS | Status: AC
  Administered 2014-08-18: 09:00:00 via INTRAVENOUS

## 2014-08-18 MED ORDER — HEPARIN SOD (PORK) LOCK FLUSH 100 UNIT/ML IV SOLN
500.0000 [IU] | Freq: Once | INTRAVENOUS | Status: AC | PRN
Start: 2014-08-18 — End: 2014-08-18
  Administered 2014-08-18: 500 [IU]
  Filled 2014-08-18: qty 5

## 2014-08-18 MED ORDER — SODIUM CHLORIDE 0.9 % IJ SOLN
10.0000 mL | INTRAMUSCULAR | Status: DC | PRN
  Administered 2014-08-18: 10 mL
  Filled 2014-08-18: qty 10

## 2014-08-18 NOTE — Patient Instructions (Signed)
Dehydration, Adult Dehydration is when you lose more fluids from the body than you take in. Vital organs like the kidneys, brain, and heart cannot function without a proper amount of fluids and salt. Any loss of fluids from the body can cause dehydration.  CAUSES   Vomiting.  Diarrhea.  Excessive sweating.  Excessive urine output.  Fever. SYMPTOMS  Mild dehydration  Thirst.  Dry lips.  Slightly dry mouth. Moderate dehydration  Very dry mouth.  Sunken eyes.  Skin does not bounce back quickly when lightly pinched and released.  Dark urine and decreased urine production.  Decreased tear production.  Headache. Severe dehydration  Very dry mouth.  Extreme thirst.  Rapid, weak pulse (more than 100 beats per minute at rest).  Cold hands and feet.  Not able to sweat in spite of heat and temperature.  Rapid breathing.  Blue lips.  Confusion and lethargy.  Difficulty being awakened.  Minimal urine production.  No tears. DIAGNOSIS  Your caregiver will diagnose dehydration based on your symptoms and your exam. Blood and urine tests will help confirm the diagnosis. The diagnostic evaluation should also identify the cause of dehydration. TREATMENT  Treatment of mild or moderate dehydration can often be done at home by increasing the amount of fluids that you drink. It is best to drink small amounts of fluid more often. Drinking too much at one time can make vomiting worse. Refer to the home care instructions below. Severe dehydration needs to be treated at the hospital where you will probably be given intravenous (IV) fluids that contain water and electrolytes. HOME CARE INSTRUCTIONS   Ask your caregiver about specific rehydration instructions.  Drink enough fluids to keep your urine clear or pale yellow.  Drink small amounts frequently if you have nausea and vomiting.  Eat as you normally do.  Avoid:  Foods or drinks high in sugar.  Carbonated  drinks.  Juice.  Extremely hot or cold fluids.  Drinks with caffeine.  Fatty, greasy foods.  Alcohol.  Tobacco.  Overeating.  Gelatin desserts.  Wash your hands well to avoid spreading bacteria and viruses.  Only take over-the-counter or prescription medicines for pain, discomfort, or fever as directed by your caregiver.  Ask your caregiver if you should continue all prescribed and over-the-counter medicines.  Keep all follow-up appointments with your caregiver. SEEK MEDICAL CARE IF:  You have abdominal pain and it increases or stays in one area (localizes).  You have a rash, stiff neck, or severe headache.  You are irritable, sleepy, or difficult to awaken.  You are weak, dizzy, or extremely thirsty. SEEK IMMEDIATE MEDICAL CARE IF:   You are unable to keep fluids down or you get worse despite treatment.  You have frequent episodes of vomiting or diarrhea.  You have blood or green matter (bile) in your vomit.  You have blood in your stool or your stool looks black and tarry.  You have not urinated in 6 to 8 hours, or you have only urinated a small amount of very dark urine.  You have a fever.  You faint. MAKE SURE YOU:   Understand these instructions.  Will watch your condition.  Will get help right away if you are not doing well or get worse. Document Released: 09/21/2005 Document Revised: 12/14/2011 Document Reviewed: 05/11/2011 ExitCare Patient Information 2015 [employer]. This information is not intended to replace advice given to you by your health care provider. Make sure you discuss any questions you have with your health care   provider.  

## 2014-08-23 ENCOUNTER — Ambulatory Visit (HOSPITAL_BASED_OUTPATIENT_CLINIC_OR_DEPARTMENT_OTHER): Payer: Medicare Other

## 2014-08-23 ENCOUNTER — Other Ambulatory Visit: Payer: Self-pay | Admitting: Hematology and Oncology

## 2014-08-23 ENCOUNTER — Other Ambulatory Visit (HOSPITAL_BASED_OUTPATIENT_CLINIC_OR_DEPARTMENT_OTHER): Payer: Medicare Other

## 2014-08-23 VITALS — BP 146/50 | HR 71 | Temp 98.1°F | Resp 18

## 2014-08-23 DIAGNOSIS — D630 Anemia in neoplastic disease: Secondary | ICD-10-CM

## 2014-08-23 DIAGNOSIS — D46C Myelodysplastic syndrome with isolated del(5q) chromosomal abnormality: Secondary | ICD-10-CM

## 2014-08-23 LAB — CBC WITH DIFFERENTIAL/PLATELET
BASO%: 1.8 % (ref 0.0–2.0)
Basophils Absolute: 0.1 10*3/uL (ref 0.0–0.1)
EOS%: 11.7 % — ABNORMAL HIGH (ref 0.0–7.0)
Eosinophils Absolute: 0.6 10*3/uL — ABNORMAL HIGH (ref 0.0–0.5)
HCT: 19.8 % — ABNORMAL LOW (ref 34.8–46.6)
HGB: 6.6 g/dL — CL (ref 11.6–15.9)
LYMPH%: 12.4 % — ABNORMAL LOW (ref 14.0–49.7)
MCH: 27.5 pg (ref 25.1–34.0)
MCHC: 33.6 g/dL (ref 31.5–36.0)
MCV: 81.7 fL (ref 79.5–101.0)
MONO#: 0.4 10*3/uL (ref 0.1–0.9)
MONO%: 8.1 % (ref 0.0–14.0)
NEUT#: 3.4 10*3/uL (ref 1.5–6.5)
NEUT%: 66 % (ref 38.4–76.8)
Platelets: 170 10*3/uL (ref 145–400)
RBC: 2.42 10*6/uL — ABNORMAL LOW (ref 3.70–5.45)
RDW: 14.5 % (ref 11.2–14.5)
WBC: 5.1 10*3/uL (ref 3.9–10.3)
lymph#: 0.6 10*3/uL — ABNORMAL LOW (ref 0.9–3.3)

## 2014-08-23 LAB — HOLD TUBE, BLOOD BANK

## 2014-08-23 LAB — TECHNOLOGIST REVIEW: Technologist Review: 1

## 2014-08-23 LAB — PREPARE RBC (CROSSMATCH)

## 2014-08-23 MED ORDER — HEPARIN SOD (PORK) LOCK FLUSH 100 UNIT/ML IV SOLN
500.0000 [IU] | Freq: Every day | INTRAVENOUS | Status: AC | PRN
  Administered 2014-08-23: 500 [IU]
  Filled 2014-08-23: qty 5

## 2014-08-23 MED ORDER — FUROSEMIDE 10 MG/ML IJ SOLN
20.0000 mg | Freq: Once | INTRAMUSCULAR | Status: AC
  Administered 2014-08-23: 20 mg via INTRAVENOUS

## 2014-08-23 MED ORDER — DIPHENHYDRAMINE HCL 25 MG PO CAPS
ORAL_CAPSULE | ORAL | Status: AC
  Filled 2014-08-23: qty 1

## 2014-08-23 MED ORDER — SODIUM CHLORIDE 0.9 % IV SOLN
250.0000 mL | Freq: Once | INTRAVENOUS | Status: AC
  Administered 2014-08-23: 250 mL via INTRAVENOUS

## 2014-08-23 MED ORDER — ACETAMINOPHEN 325 MG PO TABS
650.0000 mg | ORAL_TABLET | Freq: Once | ORAL | Status: AC
  Administered 2014-08-23: 650 mg via ORAL

## 2014-08-23 MED ORDER — ACETAMINOPHEN 325 MG PO TABS
ORAL_TABLET | ORAL | Status: AC
  Filled 2014-08-23: qty 2

## 2014-08-23 MED ORDER — DIPHENHYDRAMINE HCL 25 MG PO CAPS
25.0000 mg | ORAL_CAPSULE | Freq: Once | ORAL | Status: AC
  Administered 2014-08-23: 25 mg via ORAL

## 2014-08-23 MED ORDER — SODIUM CHLORIDE 0.9 % IJ SOLN
10.0000 mL | INTRAMUSCULAR | Status: AC | PRN
  Administered 2014-08-23: 10 mL
  Filled 2014-08-23: qty 10

## 2014-08-23 NOTE — Patient Instructions (Signed)

## 2014-08-24 LAB — TYPE AND SCREEN
ABO/RH(D): A POS
Antibody Screen: NEGATIVE
UNIT DIVISION: 0
Unit division: 0

## 2014-08-27 ENCOUNTER — Other Ambulatory Visit (HOSPITAL_BASED_OUTPATIENT_CLINIC_OR_DEPARTMENT_OTHER): Payer: Medicare Other

## 2014-08-27 DIAGNOSIS — D46C Myelodysplastic syndrome with isolated del(5q) chromosomal abnormality: Secondary | ICD-10-CM

## 2014-08-27 DIAGNOSIS — D630 Anemia in neoplastic disease: Secondary | ICD-10-CM

## 2014-08-27 LAB — CBC WITH DIFFERENTIAL/PLATELET
BASO%: 1.2 % (ref 0.0–2.0)
Basophils Absolute: 0.1 10*3/uL (ref 0.0–0.1)
EOS%: 12.2 % — ABNORMAL HIGH (ref 0.0–7.0)
Eosinophils Absolute: 0.7 10*3/uL — ABNORMAL HIGH (ref 0.0–0.5)
HCT: 26.6 % — ABNORMAL LOW (ref 34.8–46.6)
HEMOGLOBIN: 9.1 g/dL — AB (ref 11.6–15.9)
LYMPH%: 12 % — ABNORMAL LOW (ref 14.0–49.7)
MCH: 28 pg (ref 25.1–34.0)
MCHC: 34.2 g/dL (ref 31.5–36.0)
MCV: 81.8 fL (ref 79.5–101.0)
MONO#: 0.4 10*3/uL (ref 0.1–0.9)
MONO%: 7.8 % (ref 0.0–14.0)
NEUT#: 3.8 10*3/uL (ref 1.5–6.5)
NEUT%: 66.8 % (ref 38.4–76.8)
PLATELETS: 151 10*3/uL (ref 145–400)
RBC: 3.25 10*6/uL — ABNORMAL LOW (ref 3.70–5.45)
RDW: 13.8 % (ref 11.2–14.5)
WBC: 5.7 10*3/uL (ref 3.9–10.3)
lymph#: 0.7 10*3/uL — ABNORMAL LOW (ref 0.9–3.3)
nRBC: 0 % (ref 0–0)

## 2014-08-27 LAB — HOLD TUBE, BLOOD BANK

## 2014-08-27 LAB — TECHNOLOGIST REVIEW: Technologist Review: 1

## 2014-09-03 ENCOUNTER — Other Ambulatory Visit (HOSPITAL_BASED_OUTPATIENT_CLINIC_OR_DEPARTMENT_OTHER): Payer: Medicare Other

## 2014-09-03 ENCOUNTER — Non-Acute Institutional Stay (HOSPITAL_COMMUNITY)
Admission: AD | Admit: 2014-09-03 | Discharge: 2014-09-03 | Disposition: A | Discharge status: Home / Self Care | Payer: Medicare Other | Source: Ambulatory Visit | Attending: Hematology and Oncology | Admitting: Hematology and Oncology

## 2014-09-03 ENCOUNTER — Other Ambulatory Visit: Payer: Self-pay | Admitting: *Deleted

## 2014-09-03 ENCOUNTER — Telehealth (HOSPITAL_COMMUNITY): Payer: Self-pay | Admitting: Internal Medicine

## 2014-09-03 DIAGNOSIS — D469 Myelodysplastic syndrome, unspecified: Secondary | ICD-10-CM

## 2014-09-03 DIAGNOSIS — D46C Myelodysplastic syndrome with isolated del(5q) chromosomal abnormality: Secondary | ICD-10-CM

## 2014-09-03 LAB — HOLD TUBE, BLOOD BANK

## 2014-09-03 LAB — CBC WITH DIFFERENTIAL/PLATELET
BASO%: 1.5 % (ref 0.0–2.0)
Basophils Absolute: 0.1 10*3/uL (ref 0.0–0.1)
EOS ABS: 0.9 10*3/uL — AB (ref 0.0–0.5)
EOS%: 15 % — ABNORMAL HIGH (ref 0.0–7.0)
HCT: 22.6 % — ABNORMAL LOW (ref 34.8–46.6)
HGB: 7.5 g/dL — ABNORMAL LOW (ref 11.6–15.9)
LYMPH%: 11.4 % — ABNORMAL LOW (ref 14.0–49.7)
MCH: 27.4 pg (ref 25.1–34.0)
MCHC: 33.3 g/dL (ref 31.5–36.0)
MCV: 82.3 fL (ref 79.5–101.0)
MONO#: 0.3 10*3/uL (ref 0.1–0.9)
MONO%: 5.9 % (ref 0.0–14.0)
NEUT%: 66.2 % (ref 38.4–76.8)
NEUTROS ABS: 3.9 10*3/uL (ref 1.5–6.5)
Platelets: 155 10*3/uL (ref 145–400)
RBC: 2.74 10*6/uL — ABNORMAL LOW (ref 3.70–5.45)
RDW: 13.8 % (ref 11.2–14.5)
WBC: 5.9 10*3/uL (ref 3.9–10.3)
lymph#: 0.7 10*3/uL — ABNORMAL LOW (ref 0.9–3.3)

## 2014-09-03 LAB — TECHNOLOGIST REVIEW

## 2014-09-03 MED ORDER — SODIUM CHLORIDE 0.9 % IJ SOLN: 3.0000 mL | INTRAMUSCULAR | Status: DC | PRN

## 2014-09-03 MED ORDER — ACETAMINOPHEN 325 MG PO TABS
650.0000 mg | ORAL_TABLET | Freq: Once | ORAL | Status: AC
  Administered 2014-09-03: 650 mg via ORAL
  Filled 2014-09-03: qty 2

## 2014-09-03 MED ORDER — DIPHENHYDRAMINE HCL 25 MG PO CAPS
25.0000 mg | ORAL_CAPSULE | Freq: Once | ORAL | Status: AC
  Administered 2014-09-03: 25 mg via ORAL
  Filled 2014-09-03: qty 1

## 2014-09-03 MED ORDER — HEPARIN SOD (PORK) LOCK FLUSH 100 UNIT/ML IV SOLN
500.0000 [IU] | Freq: Every day | INTRAVENOUS | Status: AC | PRN
  Administered 2014-09-03: 500 [IU]
  Filled 2014-09-03: qty 5

## 2014-09-03 MED ORDER — SODIUM CHLORIDE 0.9 % IJ SOLN
10.0000 mL | INTRAMUSCULAR | Status: AC | PRN
  Administered 2014-09-03: 10 mL

## 2014-09-03 MED ORDER — SODIUM CHLORIDE 0.9 % IV SOLN
250.0000 mL | Freq: Once | INTRAVENOUS | Status: AC
Start: 2014-09-03 — End: 2014-09-03
  Administered 2014-09-03: 250 mL via INTRAVENOUS

## 2014-09-03 MED ORDER — FUROSEMIDE 10 MG/ML IJ SOLN
20.0000 mg | Freq: Once | INTRAMUSCULAR | Status: AC
  Administered 2014-09-03: 20 mg via INTRAVENOUS
  Filled 2014-09-03: qty 2

## 2014-09-03 MED ORDER — HEPARIN SOD (PORK) LOCK FLUSH 100 UNIT/ML IV SOLN: 250.0000 [IU] | INTRAVENOUS | Status: DC | PRN

## 2014-09-03 NOTE — H&P (Signed)
The patient is here for transfusion only  

## 2014-09-03 NOTE — Procedures (Signed)
[facility]  Procedure Note  Rebekah Cross WGN:[id number] DOB: Feb 25, 1925 DOA: 09/03/2014   PCP: Mayra Neer, MD   Associated Diagnosis: MDS (myelodyplastic syndrome)  Procedure Note:  Transfusion of 2 units PRBCs   Condition During Procedure:  Pt tolerated well   Condition at Discharge:  Pt alert, oriented, ambulatory; family at bedside; no complications noted   Nigel Sloop, [facility]

## 2014-09-03 NOTE — Progress Notes (Signed)
S/w pt in lobby and instructed to go to [facility] for transfusion today per Dr. Alvy Bimler.  She verbalized understanding.

## 2014-09-03 NOTE — Telephone Encounter (Signed)
Close encounter 

## 2014-09-04 LAB — TYPE AND SCREEN
ABO/RH(D): A POS
ANTIBODY SCREEN: NEGATIVE
UNIT DIVISION: 0
Unit division: 0

## 2014-09-10 ENCOUNTER — Encounter: Payer: Self-pay | Admitting: Hematology and Oncology

## 2014-09-10 ENCOUNTER — Ambulatory Visit (HOSPITAL_BASED_OUTPATIENT_CLINIC_OR_DEPARTMENT_OTHER): Payer: Medicare Other | Admitting: Hematology and Oncology

## 2014-09-10 ENCOUNTER — Other Ambulatory Visit (HOSPITAL_BASED_OUTPATIENT_CLINIC_OR_DEPARTMENT_OTHER): Payer: Medicare Other

## 2014-09-10 ENCOUNTER — Telehealth: Payer: Self-pay | Admitting: Hematology and Oncology

## 2014-09-10 ENCOUNTER — Telehealth: Payer: Self-pay | Admitting: *Deleted

## 2014-09-10 DIAGNOSIS — L409 Psoriasis, unspecified: Secondary | ICD-10-CM

## 2014-09-10 DIAGNOSIS — D630 Anemia in neoplastic disease: Secondary | ICD-10-CM

## 2014-09-10 DIAGNOSIS — D46C Myelodysplastic syndrome with isolated del(5q) chromosomal abnormality: Secondary | ICD-10-CM

## 2014-09-10 DIAGNOSIS — D539 Nutritional anemia, unspecified: Secondary | ICD-10-CM

## 2014-09-10 DIAGNOSIS — R112 Nausea with vomiting, unspecified: Secondary | ICD-10-CM

## 2014-09-10 LAB — CBC WITH DIFFERENTIAL/PLATELET
BASO%: 2.6 % — ABNORMAL HIGH (ref 0.0–2.0)
Basophils Absolute: 0.2 10*3/uL — ABNORMAL HIGH (ref 0.0–0.1)
EOS ABS: 1 10*3/uL — AB (ref 0.0–0.5)
EOS%: 15.2 % — ABNORMAL HIGH (ref 0.0–7.0)
HEMATOCRIT: 28.6 % — AB (ref 34.8–46.6)
HEMOGLOBIN: 9.5 g/dL — AB (ref 11.6–15.9)
LYMPH%: 11.8 % — ABNORMAL LOW (ref 14.0–49.7)
MCH: 27.8 pg (ref 25.1–34.0)
MCHC: 33.2 g/dL (ref 31.5–36.0)
MCV: 83.6 fL (ref 79.5–101.0)
MONO#: 0.4 10*3/uL (ref 0.1–0.9)
MONO%: 6.7 % (ref 0.0–14.0)
NEUT%: 63.7 % (ref 38.4–76.8)
NEUTROS ABS: 4.1 10*3/uL (ref 1.5–6.5)
NRBC: 0 % (ref 0–0)
Platelets: 137 10*3/uL — ABNORMAL LOW (ref 145–400)
RBC: 3.42 10*6/uL — AB (ref 3.70–5.45)
RDW: 13.9 % (ref 11.2–14.5)
WBC: 6.5 10*3/uL (ref 3.9–10.3)
lymph#: 0.8 10*3/uL — ABNORMAL LOW (ref 0.9–3.3)

## 2014-09-10 LAB — HOLD TUBE, BLOOD BANK

## 2014-09-10 LAB — TECHNOLOGIST REVIEW

## 2014-09-10 MED ORDER — PREDNISONE 2.5 MG PO TABS: 12.5000 mg | ORAL_TABLET | Freq: Every day | ORAL | Status: DC

## 2014-09-10 NOTE — Telephone Encounter (Signed)
Pt confirmed labs/ov per 12/07 POF, gave pt AVS.... KJ, sent msg to add blood transfusion 5 hour

## 2014-09-10 NOTE — Assessment & Plan Note (Addendum)
This has improved since recent reduction of prednisone. I suspect they might be an element of gastritis. She will continue proton pump inhibitor.

## 2014-09-10 NOTE — Assessment & Plan Note (Signed)
This is likely due to recent treatment. The patient denies recent history of bleeding such as epistaxis, hematuria or hematochezia. She is asymptomatic from the anemia. I will observe for now.  She does not require transfusion now.  I will recheck serum erythropoietin in the next visit. The plan would be to give her 2 units of blood whenever her hemoglobin dropped to less than 8 g

## 2014-09-10 NOTE — Assessment & Plan Note (Signed)
This has flared up since recent prednisone taper. I am increasing prednisone to 12.5 mg daily and reassess in the next visit.

## 2014-09-10 NOTE — Progress Notes (Signed)
[place] OFFICE PROGRESS NOTE  Patient Care Team: Mayra Neer, MD as PCP - General (Family Medicine) Heath Lark, MD as Consulting Physician (Hematology and Oncology)  SUMMARY OF ONCOLOGIC HISTORY:  This is a very pleasant 80 year old lady who become transfusion dependent since 2014. She denies any bleeding such as epistaxis, hematuria, or hematochezia. 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 Erythropoietin stimulating agents were stopped when her erythropoietin level came back over 500 In October 2014, she was started on Revlimid however develop severe allergic reaction to Revlimid. That was discontinued. On 08/07/2013, we started her on Vidaza for 2 cycles. On 10/04/2013, repeat a bone marrow aspirate and biopsy showed persistent disease. Additional testing revealed that her disease is JAK 2 positive On 11/06/2013, Vidaza is resumed after her second opinion from [facility] On 02/08/2014, repeat bone marrow aspirate and biopsy showed no evidence of increased blasts On 04/16/2014, Vidaza is discontinued due to increased transfusion requirement and progressive weakness On 04/30/14: she was started on 100 mg danazol On 05/21/14: Danazol is increased to 200 mg daily. On 06/18/2014, danazol was discontinued. On 06/25/14: Shanon Brow is added On 07/16/2014, Jakafi is on hold due to severe pancytopenia and uncontrolled nausea and vomiting INTERVAL HISTORY: Please see below for problem oriented charting. She is doing well apart from recent flare of psoriasis causing skin itchiness. Denies recent infection. Her nausea has improved. Denies leg swelling.  REVIEW OF SYSTEMS:   Constitutional: Denies fevers, chills or abnormal weight loss Eyes: Denies blurriness of vision Ears, nose, mouth, throat, and face: Denies mucositis or sore throat Respiratory: Denies cough, dyspnea or  wheezes Cardiovascular: Denies palpitation, chest discomfort or lower extremity swelling Gastrointestinal:  Denies nausea, heartburn or change in bowel habits Lymphatics: Denies new lymphadenopathy or easy bruising Neurological:Denies numbness, tingling or new weaknesses Behavioral/Psych: Mood is stable, no new changes  All other systems were reviewed with the patient and are negative.  I have reviewed the past medical history, past surgical history, social history and family history with the patient and they are unchanged from previous note.  ALLERGIES:  is allergic to novocain; oxycodone; codeine; methimazole; and revlimid.  MEDICATIONS:  Current Outpatient Prescriptions  Medication Sig Dispense Refill  . acetaminophen (TYLENOL) 500 MG tablet Take 1,000 mg by mouth every 6 (six) hours as needed (Pain).     Marland Kitchen desoximetasone (TOPICORT) 0.25 % cream Apply 1 application topically 2 (two) times daily as needed. For rash    . hydrOXYzine (VISTARIL) 25 MG capsule Take 1 capsule (25 mg total) by mouth every 4 (four) hours as needed. 90 capsule 3  . insulin aspart (NOVOLOG) 100 UNIT/ML injection Inject 15 Units into the skin 3 (three) times daily before meals. For Blood sugar over 200    . insulin detemir (LEVEMIR) 100 UNIT/ML injection Inject 12 Units into the skin daily.     Marland Kitchen lidocaine-prilocaine (EMLA) cream     . LORazepam (ATIVAN) 0.5 MG tablet Take 0.5 mg by mouth at bedtime.     . metFORMIN (GLUCOPHAGE) 500 MG tablet Take 1 tablet (500 mg total) by mouth 2 (two) times daily with a meal. 60 tablet 1  . metoprolol succinate (TOPROL-XL) 100 MG 24 hr tablet Take 50 mg by mouth every morning.     . mometasone (ELOCON) 0.1 % cream Apply 1 application topically daily as needed. Rash    . mometasone (NASONEX) 50  MCG/ACT nasal spray Place 2 sprays into the nose 2 (two) times daily.    . Multiple Vitamin (MULTIVITAMIN) tablet Take 1 tablet by mouth every morning.     . nisoldipine (SULAR) 17 MG 24  hr tablet Take 17 mg by mouth daily.    Marland Kitchen olmesartan-hydrochlorothiazide (BENICAR HCT) 40-25 MG per tablet Take 1 tablet by mouth daily.    Marland Kitchen omeprazole (PRILOSEC) 40 MG capsule Take 1 capsule (40 mg total) by mouth daily. 90 capsule 3  . ondansetron (ZOFRAN) 8 MG tablet Take 1 tablet (8 mg total) by mouth 2 (two) times daily as needed for nausea or vomiting. 60 tablet 1  . potassium chloride SA (K-DUR,KLOR-CON) 20 MEQ tablet Take 1 tablet (20 mEq total) by mouth 2 (two) times daily. 60 tablet 1  . predniSONE (DELTASONE) 10 MG tablet Take 1 tablet (10 mg total) by mouth daily with breakfast. 60 tablet 3  . prochlorperazine (COMPAZINE) 10 MG tablet     . ranitidine (ZANTAC) 75 MG tablet Take 1 tablet (75 mg total) by mouth at bedtime. 30 tablet 6  . scopolamine (TRANSDERM-SCOP) 1 MG/3DAYS Place 1 patch (1.5 mg total) onto the skin every 3 (three) days. 10 patch 12  . traMADol (ULTRAM) 50 MG tablet     . triamcinolone cream (KENALOG) 0.1 %     . predniSONE (DELTASONE) 2.5 MG tablet Take 5 tablets (12.5 mg total) by mouth daily with breakfast. 90 tablet 1   No current facility-administered medications for this visit.   Facility-Administered Medications Ordered in Other Visits  Medication Dose Route Frequency Provider Last Rate Last Dose  . sodium chloride 0.9 % injection 3 mL  3 mL Intracatheter PRN Heath Lark, MD   3 mL at 10/25/13 1407    PHYSICAL EXAMINATION: ECOG PERFORMANCE STATUS: 1 - Symptomatic but completely ambulatory GENERAL:alert, no distress and comfortable SKIN: skin color, texture, turgor are normal, no rashes or significant lesions. Noted mild skin rash compatible with psoriasis EYES: normal, Conjunctiva are pink and non-injected, sclera clear OROPHARYNX:no exudate, no erythema and lips, buccal mucosa, and tongue normal  NECK: supple, thyroid normal size, non-tender, without nodularity LYMPH:  no palpable lymphadenopathy in the cervical, axillary or inguinal LUNGS: clear to  auscultation and percussion with normal breathing effort HEART: regular rate & rhythm and no murmurs and no lower extremity edema ABDOMEN:abdomen soft, non-tender and normal bowel sounds Musculoskeletal:no cyanosis of digits and no clubbing  NEURO: alert & oriented x 3 with fluent speech, no focal motor/sensory deficits  LABORATORY DATA:  I have reviewed the data as listed    Component Value Date/Time   NA 142 07/23/2014 0920   NA 134* 04/19/2014 1940   K 3.5 07/23/2014 0920   K 4.7 04/19/2014 1940   CL 92* 04/19/2014 1940   CO2 27 07/23/2014 0920   CO2 25 04/19/2014 1940   GLUCOSE 131 07/23/2014 0920   GLUCOSE 398* 04/19/2014 1940   BUN 23.6 07/23/2014 0920   BUN 42* 04/19/2014 1940   CREATININE 1.0 07/23/2014 0920   CREATININE 0.80 04/19/2014 1940   CALCIUM 9.3 07/23/2014 0920   CALCIUM 9.8 04/19/2014 1940   PROT 6.0* 07/23/2014 0920   PROT 6.6 04/19/2014 1940   ALBUMIN 3.2* 07/23/2014 0920   ALBUMIN 3.0* 04/19/2014 1940   AST 20 07/23/2014 0920   AST 12 04/19/2014 1940   ALT 47 07/23/2014 0920   ALT 20 04/19/2014 1940   ALKPHOS 42 07/23/2014 0920   ALKPHOS 60 04/19/2014 1940  BILITOT 1.34* 07/23/2014 0920   BILITOT 0.6 04/19/2014 1940   GFRNONAA 65* 04/19/2014 1940   GFRAA 75* 04/19/2014 1940    No results found for: SPEP, UPEP  Lab Results  Component Value Date   WBC 6.5 09/10/2014   NEUTROABS 4.1 09/10/2014   HGB 9.5* 09/10/2014   HCT 28.6* 09/10/2014   MCV 83.6 09/10/2014   PLT 137* 09/10/2014      Chemistry      Component Value Date/Time   NA 142 07/23/2014 0920   NA 134* 04/19/2014 1940   K 3.5 07/23/2014 0920   K 4.7 04/19/2014 1940   CL 92* 04/19/2014 1940   CO2 27 07/23/2014 0920   CO2 25 04/19/2014 1940   BUN 23.6 07/23/2014 0920   BUN 42* 04/19/2014 1940   CREATININE 1.0 07/23/2014 0920   CREATININE 0.80 04/19/2014 1940   GLU 199* 04/23/2014 0910      Component Value Date/Time   CALCIUM 9.3 07/23/2014 0920   CALCIUM 9.8 04/19/2014  1940   ALKPHOS 42 07/23/2014 0920   ALKPHOS 60 04/19/2014 1940   AST 20 07/23/2014 0920   AST 12 04/19/2014 1940   ALT 47 07/23/2014 0920   ALT 20 04/19/2014 1940   BILITOT 1.34* 07/23/2014 0920   BILITOT 0.6 04/19/2014 1940       RADIOGRAPHIC STUDIES: I have personally reviewed the radiological images as listed and agreed with the findings in the report. No results found.   ASSESSMENT & PLAN:  MDS (myelodysplastic syndrome) with 5q deletion Overall, she tolerated treatment very poorly. I recommend she continue to hold chemotherapy and continue supportive care only. She will also continue on prednisone daily. We will revisit the issue about treatment after the holidays.    Anemia in neoplastic disease This is likely due to recent treatment. The patient denies recent history of bleeding such as epistaxis, hematuria or hematochezia. She is asymptomatic from the anemia. I will observe for now.  She does not require transfusion now.  I will recheck serum erythropoietin in the next visit. The plan would be to give her 2 units of blood whenever her hemoglobin dropped to less than 8 g   Psoriasis This has flared up since recent prednisone taper. I am increasing prednisone to 12.5 mg daily and reassess in the next visit.  Moderate nausea and vomiting This has improved since recent reduction of prednisone. I suspect they might be an element of gastritis. She will continue proton pump inhibitor.   Orders Placed This Encounter  Procedures  . Vitamin B12    Standing Status: Future     Number of Occurrences:      Standing Expiration Date: 10/15/2015  . Iron and TIBC    Standing Status: Future     Number of Occurrences:      Standing Expiration Date: 10/15/2015  . Ferritin    Standing Status: Future     Number of Occurrences:      Standing Expiration Date: 10/15/2015  . Erythropoietin    Standing Status: Future     Number of Occurrences:      Standing Expiration Date: 10/15/2015    All questions were answered. The patient knows to call the clinic with any problems, questions or concerns. No barriers to learning was detected. I spent 25 minutes counseling the patient face to face. The total time spent in the appointment was 30 minutes and more than 50% was on counseling and review of test results     [facility],  Storm Sovine, MD 09/10/2014 8:17 PM

## 2014-09-10 NOTE — Telephone Encounter (Signed)
Per staff message and POF I have scheduled appts. Advised scheduler of appts. JMW  

## 2014-09-10 NOTE — Assessment & Plan Note (Signed)
Overall, she tolerated treatment very poorly. I recommend she continue to hold chemotherapy and continue supportive care only. She will also continue on prednisone daily. We will revisit the issue about treatment after the holidays.

## 2014-09-11 ENCOUNTER — Other Ambulatory Visit: Payer: Self-pay | Admitting: Hematology and Oncology

## 2014-09-12 ENCOUNTER — Telehealth: Payer: Self-pay | Admitting: *Deleted

## 2014-09-12 NOTE — Telephone Encounter (Signed)
Opened note in error.

## 2014-09-17 ENCOUNTER — Ambulatory Visit (HOSPITAL_BASED_OUTPATIENT_CLINIC_OR_DEPARTMENT_OTHER): Payer: Medicare Other

## 2014-09-17 ENCOUNTER — Other Ambulatory Visit: Payer: Self-pay | Admitting: Hematology and Oncology

## 2014-09-17 ENCOUNTER — Other Ambulatory Visit (HOSPITAL_BASED_OUTPATIENT_CLINIC_OR_DEPARTMENT_OTHER): Payer: Medicare Other

## 2014-09-17 ENCOUNTER — Ambulatory Visit (HOSPITAL_COMMUNITY)
Admission: RE | Admit: 2014-09-17 | Discharge: 2014-09-17 | Disposition: A | Discharge status: Home / Self Care | Payer: Medicare Other | Source: Ambulatory Visit | Attending: Hematology and Oncology | Admitting: Hematology and Oncology

## 2014-09-17 VITALS — BP 125/33 | HR 69 | Temp 98.8°F | Resp 18

## 2014-09-17 DIAGNOSIS — D630 Anemia in neoplastic disease: Secondary | ICD-10-CM | POA: Diagnosis not present

## 2014-09-17 DIAGNOSIS — D46C Myelodysplastic syndrome with isolated del(5q) chromosomal abnormality: Secondary | ICD-10-CM | POA: Insufficient documentation

## 2014-09-17 DIAGNOSIS — D539 Nutritional anemia, unspecified: Secondary | ICD-10-CM

## 2014-09-17 LAB — CBC WITH DIFFERENTIAL/PLATELET
BASO%: 0.9 % (ref 0.0–2.0)
Basophils Absolute: 0.1 10*3/uL (ref 0.0–0.1)
EOS ABS: 0.7 10*3/uL — AB (ref 0.0–0.5)
EOS%: 9.7 % — ABNORMAL HIGH (ref 0.0–7.0)
HEMATOCRIT: 24.3 % — AB (ref 34.8–46.6)
HGB: 8.1 g/dL — ABNORMAL LOW (ref 11.6–15.9)
LYMPH#: 0.7 10*3/uL — AB (ref 0.9–3.3)
LYMPH%: 9.5 % — ABNORMAL LOW (ref 14.0–49.7)
MCH: 27.2 pg (ref 25.1–34.0)
MCHC: 33.1 g/dL (ref 31.5–36.0)
MCV: 82.3 fL (ref 79.5–101.0)
MONO#: 0.3 10*3/uL (ref 0.1–0.9)
MONO%: 4 % (ref 0.0–14.0)
NEUT%: 75.9 % (ref 38.4–76.8)
NEUTROS ABS: 5.4 10*3/uL (ref 1.5–6.5)
Platelets: 139 10*3/uL — ABNORMAL LOW (ref 145–400)
RBC: 2.96 10*6/uL — ABNORMAL LOW (ref 3.70–5.45)
RDW: 14 % (ref 11.2–14.5)
WBC: 7.1 10*3/uL (ref 3.9–10.3)

## 2014-09-17 LAB — TECHNOLOGIST REVIEW: Technologist Review: 1

## 2014-09-17 LAB — HOLD TUBE, BLOOD BANK

## 2014-09-17 LAB — IRON AND TIBC CHCC
Iron: 173 ug/dL — ABNORMAL HIGH (ref 41–142)
TIBC: 171 ug/dL — AB (ref 236–444)

## 2014-09-17 LAB — PREPARE RBC (CROSSMATCH)

## 2014-09-17 LAB — FERRITIN CHCC: Ferritin: 2992 ng/ml — ABNORMAL HIGH (ref 9–269)

## 2014-09-17 MED ORDER — SODIUM CHLORIDE 0.9 % IJ SOLN
10.0000 mL | INTRAMUSCULAR | Status: AC | PRN
  Administered 2014-09-17: 10 mL
  Filled 2014-09-17: qty 10

## 2014-09-17 MED ORDER — HEPARIN SOD (PORK) LOCK FLUSH 100 UNIT/ML IV SOLN
500.0000 [IU] | Freq: Every day | INTRAVENOUS | Status: AC | PRN
  Administered 2014-09-17: 500 [IU]
  Filled 2014-09-17: qty 5

## 2014-09-17 MED ORDER — ACETAMINOPHEN 325 MG PO TABS
650.0000 mg | ORAL_TABLET | Freq: Once | ORAL | Status: AC
  Administered 2014-09-17: 650 mg via ORAL

## 2014-09-17 MED ORDER — DIPHENHYDRAMINE HCL 25 MG PO CAPS
25.0000 mg | ORAL_CAPSULE | Freq: Once | ORAL | Status: AC
  Administered 2014-09-17: 25 mg via ORAL

## 2014-09-17 MED ORDER — DIPHENHYDRAMINE HCL 25 MG PO CAPS
ORAL_CAPSULE | ORAL | Status: AC
Start: 2014-09-17 — End: 2014-09-17
  Filled 2014-09-17: qty 1

## 2014-09-17 MED ORDER — ACETAMINOPHEN 325 MG PO TABS
ORAL_TABLET | ORAL | Status: AC
  Filled 2014-09-17: qty 2

## 2014-09-17 MED ORDER — SODIUM CHLORIDE 0.9 % IV SOLN
250.0000 mL | Freq: Once | INTRAVENOUS | Status: AC
  Administered 2014-09-17: 250 mL via INTRAVENOUS

## 2014-09-17 NOTE — Patient Instructions (Signed)

## 2014-09-17 NOTE — Progress Notes (Signed)
OK to ttransfuse 1 unit today with HGB of 8.1  Per Dr. Alvy Bimler.

## 2014-09-18 LAB — TYPE AND SCREEN
ABO/RH(D): A POS
Antibody Screen: NEGATIVE
UNIT DIVISION: 0

## 2014-09-20 LAB — ERYTHROPOIETIN: Erythropoietin: 1170.1 m[IU]/mL — ABNORMAL HIGH (ref 2.6–18.5)

## 2014-09-20 LAB — VITAMIN B12: Vitamin B-12: 1152 pg/mL — ABNORMAL HIGH (ref 211–911)

## 2014-09-24 ENCOUNTER — Telehealth: Payer: Self-pay | Admitting: Hematology and Oncology

## 2014-09-24 ENCOUNTER — Other Ambulatory Visit: Payer: Self-pay | Admitting: Hematology and Oncology

## 2014-09-24 ENCOUNTER — Telehealth: Payer: Self-pay | Admitting: *Deleted

## 2014-09-24 ENCOUNTER — Ambulatory Visit (HOSPITAL_BASED_OUTPATIENT_CLINIC_OR_DEPARTMENT_OTHER): Payer: Medicare Other

## 2014-09-24 DIAGNOSIS — D46C Myelodysplastic syndrome with isolated del(5q) chromosomal abnormality: Secondary | ICD-10-CM

## 2014-09-24 DIAGNOSIS — D630 Anemia in neoplastic disease: Secondary | ICD-10-CM

## 2014-09-24 LAB — HOLD TUBE, BLOOD BANK

## 2014-09-24 LAB — CBC WITH DIFFERENTIAL/PLATELET
BASO%: 1.2 % (ref 0.0–2.0)
Basophils Absolute: 0.1 10*3/uL (ref 0.0–0.1)
EOS%: 10.5 % — ABNORMAL HIGH (ref 0.0–7.0)
Eosinophils Absolute: 0.8 10*3/uL — ABNORMAL HIGH (ref 0.0–0.5)
HCT: 26.1 % — ABNORMAL LOW (ref 34.8–46.6)
HGB: 8.4 g/dL — ABNORMAL LOW (ref 11.6–15.9)
LYMPH#: 0.8 10*3/uL — AB (ref 0.9–3.3)
LYMPH%: 9.8 % — ABNORMAL LOW (ref 14.0–49.7)
MCH: 26.7 pg (ref 25.1–34.0)
MCHC: 32.2 g/dL (ref 31.5–36.0)
MCV: 82.8 fL (ref 79.5–101.0)
MONO#: 0.3 10*3/uL (ref 0.1–0.9)
MONO%: 4.1 % (ref 0.0–14.0)
NEUT#: 5.9 10*3/uL (ref 1.5–6.5)
NEUT%: 74.4 % (ref 38.4–76.8)
Platelets: 151 10*3/uL (ref 145–400)
RBC: 3.15 10*6/uL — ABNORMAL LOW (ref 3.70–5.45)
RDW: 13.7 % (ref 11.2–14.5)
WBC: 8 10*3/uL (ref 3.9–10.3)

## 2014-09-24 LAB — TECHNOLOGIST REVIEW

## 2014-09-24 MED ORDER — ONDANSETRON HCL 8 MG PO TABS: 8.0000 mg | ORAL_TABLET | Freq: Two times a day (BID) | ORAL | Status: AC | PRN

## 2014-09-24 NOTE — Telephone Encounter (Signed)
-----   Message from Heath Lark, MD sent at 09/24/2014  9:48 AM EST ----- She does not need blood today but I suspect she may need it later this week. Does she want to come in for blood on Wednesday? We can type & cross her today for 2 units wednesday ----- Message -----    From: [facility]    Sent: 09/24/2014   9:38 AM      To: Heath Lark, MD

## 2014-09-24 NOTE — Telephone Encounter (Signed)
Informed daughter of appt will be made for lab/transfusion for Wednesday.   Pt has already taken off her blue blood band after this RN already s/w w/ them in lobby this morning and informed of no need for transfusion today.  Will call her back w/ time of lab/transfusion for Wednesday.  She verbalized understanding.

## 2014-09-24 NOTE — Telephone Encounter (Signed)
90 tabs, 3 refills

## 2014-09-24 NOTE — Telephone Encounter (Signed)
Informed daughter of zofran refill sent to pharmacy.  Also informed of appt on Wednesday 12/23 for lab and transfusion.  She verbalized understanding.

## 2014-09-24 NOTE — Telephone Encounter (Signed)
Daughter states pt needs refill on Zofran sent to pharmacy.   Pt is taking it twice daily and it has been helping prevent her nausea.

## 2014-09-24 NOTE — Telephone Encounter (Signed)
s.wl pt daughter and advised on 12.23 appt....Marland Kitchenok and aware.Marland Kitchen

## 2014-09-26 ENCOUNTER — Ambulatory Visit (HOSPITAL_BASED_OUTPATIENT_CLINIC_OR_DEPARTMENT_OTHER): Payer: Medicare Other

## 2014-09-26 ENCOUNTER — Ambulatory Visit (HOSPITAL_BASED_OUTPATIENT_CLINIC_OR_DEPARTMENT_OTHER): Payer: Medicare Other | Admitting: Lab

## 2014-09-26 ENCOUNTER — Other Ambulatory Visit: Payer: Medicare Other

## 2014-09-26 ENCOUNTER — Other Ambulatory Visit: Payer: Self-pay | Admitting: Medical Oncology

## 2014-09-26 VITALS — BP 135/36 | HR 85 | Temp 99.1°F | Resp 16

## 2014-09-26 DIAGNOSIS — D630 Anemia in neoplastic disease: Secondary | ICD-10-CM

## 2014-09-26 DIAGNOSIS — D46C Myelodysplastic syndrome with isolated del(5q) chromosomal abnormality: Secondary | ICD-10-CM

## 2014-09-26 LAB — CBC WITH DIFFERENTIAL/PLATELET
BASO%: 0.4 % (ref 0.0–2.0)
Basophils Absolute: 0.1 10*3/uL (ref 0.0–0.1)
EOS%: 3.6 % (ref 0.0–7.0)
Eosinophils Absolute: 0.6 10*3/uL — ABNORMAL HIGH (ref 0.0–0.5)
HCT: 21.9 % — ABNORMAL LOW (ref 34.8–46.6)
HGB: 7.3 g/dL — ABNORMAL LOW (ref 11.6–15.9)
LYMPH%: 3.7 % — AB (ref 14.0–49.7)
MCH: 27.2 pg (ref 25.1–34.0)
MCHC: 33.3 g/dL (ref 31.5–36.0)
MCV: 81.7 fL (ref 79.5–101.0)
MONO#: 1 10*3/uL — ABNORMAL HIGH (ref 0.1–0.9)
MONO%: 6.3 % (ref 0.0–14.0)
NEUT%: 86 % — AB (ref 38.4–76.8)
NEUTROS ABS: 13.7 10*3/uL — AB (ref 1.5–6.5)
Platelets: 122 10*3/uL — ABNORMAL LOW (ref 145–400)
RBC: 2.68 10*6/uL — ABNORMAL LOW (ref 3.70–5.45)
RDW: 13.7 % (ref 11.2–14.5)
WBC: 16 10*3/uL — AB (ref 3.9–10.3)
lymph#: 0.6 10*3/uL — ABNORMAL LOW (ref 0.9–3.3)
nRBC: 0 % (ref 0–0)

## 2014-09-26 LAB — HOLD TUBE, BLOOD BANK

## 2014-09-26 MED ORDER — ACETAMINOPHEN 325 MG PO TABS
650.0000 mg | ORAL_TABLET | Freq: Once | ORAL | Status: AC
  Administered 2014-09-26: 650 mg via ORAL

## 2014-09-26 MED ORDER — SODIUM CHLORIDE 0.9 % IV SOLN
250.0000 mL | Freq: Once | INTRAVENOUS | Status: AC
  Administered 2014-09-26: 250 mL via INTRAVENOUS

## 2014-09-26 MED ORDER — ACETAMINOPHEN 325 MG PO TABS
ORAL_TABLET | ORAL | Status: AC
  Filled 2014-09-26: qty 2

## 2014-09-26 MED ORDER — SODIUM CHLORIDE 0.9 % IJ SOLN
10.0000 mL | INTRAMUSCULAR | Status: AC | PRN
  Administered 2014-09-26: 10 mL
  Filled 2014-09-26: qty 10

## 2014-09-26 MED ORDER — HEPARIN SOD (PORK) LOCK FLUSH 100 UNIT/ML IV SOLN
500.0000 [IU] | Freq: Every day | INTRAVENOUS | Status: AC | PRN
  Administered 2014-09-26: 500 [IU]
  Filled 2014-09-26: qty 5

## 2014-09-26 MED ORDER — DIPHENHYDRAMINE HCL 25 MG PO CAPS
25.0000 mg | ORAL_CAPSULE | Freq: Once | ORAL | Status: AC
  Administered 2014-09-26: 25 mg via ORAL

## 2014-09-26 MED ORDER — FUROSEMIDE 10 MG/ML IJ SOLN
20.0000 mg | Freq: Once | INTRAMUSCULAR | Status: AC
  Administered 2014-09-26: 20 mg via INTRAVENOUS

## 2014-09-26 MED ORDER — DIPHENHYDRAMINE HCL 25 MG PO CAPS
ORAL_CAPSULE | ORAL | Status: AC
  Filled 2014-09-26: qty 1

## 2014-09-26 NOTE — Patient Instructions (Signed)

## 2014-09-28 ENCOUNTER — Emergency Department (HOSPITAL_COMMUNITY): Payer: Medicare Other

## 2014-09-28 ENCOUNTER — Inpatient Hospital Stay (HOSPITAL_COMMUNITY)
Admission: EM | Admit: 2014-09-28 | Discharge: 2014-10-05 | DRG: 871 | Disposition: A | Discharge status: Home / Self Care | Payer: Medicare Other | Attending: Internal Medicine | Admitting: Internal Medicine

## 2014-09-28 ENCOUNTER — Encounter (HOSPITAL_COMMUNITY): Payer: Self-pay | Admitting: Emergency Medicine

## 2014-09-28 DIAGNOSIS — L409 Psoriasis, unspecified: Secondary | ICD-10-CM | POA: Diagnosis present

## 2014-09-28 DIAGNOSIS — I129 Hypertensive chronic kidney disease with stage 1 through stage 4 chronic kidney disease, or unspecified chronic kidney disease: Secondary | ICD-10-CM | POA: Diagnosis present

## 2014-09-28 DIAGNOSIS — B9620 Unspecified Escherichia coli [E. coli] as the cause of diseases classified elsewhere: Secondary | ICD-10-CM | POA: Diagnosis present

## 2014-09-28 DIAGNOSIS — Z7952 Long term (current) use of systemic steroids: Secondary | ICD-10-CM

## 2014-09-28 DIAGNOSIS — E1122 Type 2 diabetes mellitus with diabetic chronic kidney disease: Secondary | ICD-10-CM | POA: Diagnosis present

## 2014-09-28 DIAGNOSIS — D630 Anemia in neoplastic disease: Secondary | ICD-10-CM | POA: Diagnosis present

## 2014-09-28 DIAGNOSIS — J9601 Acute respiratory failure with hypoxia: Secondary | ICD-10-CM | POA: Diagnosis present

## 2014-09-28 DIAGNOSIS — K219 Gastro-esophageal reflux disease without esophagitis: Secondary | ICD-10-CM | POA: Diagnosis present

## 2014-09-28 DIAGNOSIS — R109 Unspecified abdominal pain: Secondary | ICD-10-CM

## 2014-09-28 DIAGNOSIS — R161 Splenomegaly, not elsewhere classified: Secondary | ICD-10-CM | POA: Diagnosis present

## 2014-09-28 DIAGNOSIS — E876 Hypokalemia: Secondary | ICD-10-CM | POA: Diagnosis not present

## 2014-09-28 DIAGNOSIS — J189 Pneumonia, unspecified organism: Secondary | ICD-10-CM | POA: Diagnosis present

## 2014-09-28 DIAGNOSIS — N183 Chronic kidney disease, stage 3 unspecified: Secondary | ICD-10-CM | POA: Diagnosis present

## 2014-09-28 DIAGNOSIS — R0602 Shortness of breath: Secondary | ICD-10-CM

## 2014-09-28 DIAGNOSIS — I5041 Acute combined systolic (congestive) and diastolic (congestive) heart failure: Secondary | ICD-10-CM

## 2014-09-28 DIAGNOSIS — T502X5A Adverse effect of carbonic-anhydrase inhibitors, benzothiadiazides and other diuretics, initial encounter: Secondary | ICD-10-CM | POA: Diagnosis present

## 2014-09-28 DIAGNOSIS — E780 Pure hypercholesterolemia: Secondary | ICD-10-CM | POA: Diagnosis present

## 2014-09-28 DIAGNOSIS — T501X5A Adverse effect of loop [high-ceiling] diuretics, initial encounter: Secondary | ICD-10-CM | POA: Diagnosis present

## 2014-09-28 DIAGNOSIS — J40 Bronchitis, not specified as acute or chronic: Secondary | ICD-10-CM | POA: Diagnosis present

## 2014-09-28 DIAGNOSIS — R7881 Bacteremia: Secondary | ICD-10-CM | POA: Insufficient documentation

## 2014-09-28 DIAGNOSIS — N390 Urinary tract infection, site not specified: Secondary | ICD-10-CM | POA: Diagnosis present

## 2014-09-28 DIAGNOSIS — Z90710 Acquired absence of both cervix and uterus: Secondary | ICD-10-CM | POA: Diagnosis not present

## 2014-09-28 DIAGNOSIS — Z794 Long term (current) use of insulin: Secondary | ICD-10-CM | POA: Diagnosis not present

## 2014-09-28 DIAGNOSIS — A419 Sepsis, unspecified organism: Secondary | ICD-10-CM | POA: Diagnosis present

## 2014-09-28 DIAGNOSIS — D46C Myelodysplastic syndrome with isolated del(5q) chromosomal abnormality: Secondary | ICD-10-CM | POA: Diagnosis present

## 2014-09-28 DIAGNOSIS — E1129 Type 2 diabetes mellitus with other diabetic kidney complication: Secondary | ICD-10-CM | POA: Diagnosis present

## 2014-09-28 DIAGNOSIS — N179 Acute kidney failure, unspecified: Secondary | ICD-10-CM

## 2014-09-28 DIAGNOSIS — E860 Dehydration: Secondary | ICD-10-CM | POA: Diagnosis present

## 2014-09-28 DIAGNOSIS — R112 Nausea with vomiting, unspecified: Secondary | ICD-10-CM | POA: Insufficient documentation

## 2014-09-28 DIAGNOSIS — N189 Chronic kidney disease, unspecified: Secondary | ICD-10-CM

## 2014-09-28 DIAGNOSIS — R509 Fever, unspecified: Secondary | ICD-10-CM | POA: Diagnosis present

## 2014-09-28 DIAGNOSIS — Z881 Allergy status to other antibiotic agents status: Secondary | ICD-10-CM | POA: Diagnosis not present

## 2014-09-28 DIAGNOSIS — I10 Essential (primary) hypertension: Secondary | ICD-10-CM | POA: Diagnosis present

## 2014-09-28 DIAGNOSIS — D72829 Elevated white blood cell count, unspecified: Secondary | ICD-10-CM | POA: Diagnosis present

## 2014-09-28 DIAGNOSIS — D696 Thrombocytopenia, unspecified: Secondary | ICD-10-CM | POA: Diagnosis present

## 2014-09-28 LAB — URINALYSIS, ROUTINE W REFLEX MICROSCOPIC
GLUCOSE, UA: NEGATIVE mg/dL
Ketones, ur: NEGATIVE mg/dL
Nitrite: NEGATIVE
PH: 5 (ref 5.0–8.0)
Protein, ur: 30 mg/dL — AB
SPECIFIC GRAVITY, URINE: 1.019 (ref 1.005–1.030)
Urobilinogen, UA: 2 mg/dL — ABNORMAL HIGH (ref 0.0–1.0)

## 2014-09-28 LAB — CBC WITH DIFFERENTIAL/PLATELET
BASOS ABS: 0 10*3/uL (ref 0.0–0.1)
Basophils Absolute: 0.1 10*3/uL (ref 0.0–0.1)
Basophils Relative: 0 % (ref 0–1)
Basophils Relative: 1 % (ref 0–1)
EOS ABS: 0.4 10*3/uL (ref 0.0–0.7)
EOS ABS: 0.3 10*3/uL (ref 0.0–0.7)
Eosinophils Relative: 3 % (ref 0–5)
Eosinophils Relative: 3 % (ref 0–5)
HCT: 26.5 % — ABNORMAL LOW (ref 36.0–46.0)
HEMATOCRIT: 24.4 % — AB (ref 36.0–46.0)
Hemoglobin: 9.1 g/dL — ABNORMAL LOW (ref 12.0–15.0)
Hemoglobin: 8.3 g/dL — ABNORMAL LOW (ref 12.0–15.0)
LYMPHS PCT: 4 % — AB (ref 12–46)
Lymphocytes Relative: 3 % — ABNORMAL LOW (ref 12–46)
Lymphs Abs: 0.4 10*3/uL — ABNORMAL LOW (ref 0.7–4.0)
Lymphs Abs: 0.4 10*3/uL — ABNORMAL LOW (ref 0.7–4.0)
MCH: 27.5 pg (ref 26.0–34.0)
MCH: 27.3 pg (ref 26.0–34.0)
MCHC: 34.3 g/dL (ref 30.0–36.0)
MCHC: 34 g/dL (ref 30.0–36.0)
MCV: 80.1 fL (ref 78.0–100.0)
MCV: 80.3 fL (ref 78.0–100.0)
MONO ABS: 0.4 10*3/uL (ref 0.1–1.0)
Monocytes Absolute: 0.3 10*3/uL (ref 0.1–1.0)
Monocytes Relative: 3 % (ref 3–12)
Monocytes Relative: 3 % (ref 3–12)
Neutro Abs: 11.2 10*3/uL — ABNORMAL HIGH (ref 1.7–7.7)
Neutro Abs: 8.1 10*3/uL — ABNORMAL HIGH (ref 1.7–7.7)
Neutrophils Relative %: 91 % — ABNORMAL HIGH (ref 43–77)
Neutrophils Relative %: 89 % — ABNORMAL HIGH (ref 43–77)
Platelets: 71 10*3/uL — ABNORMAL LOW (ref 150–400)
Platelets: 64 10*3/uL — ABNORMAL LOW (ref 150–400)
RBC: 3.31 MIL/uL — ABNORMAL LOW (ref 3.87–5.11)
RBC: 3.04 MIL/uL — ABNORMAL LOW (ref 3.87–5.11)
RDW: 14.9 % (ref 11.5–15.5)
RDW: 15 % (ref 11.5–15.5)
WBC Morphology: INCREASED
WBC: 12.4 10*3/uL — ABNORMAL HIGH (ref 4.0–10.5)
WBC: 9.2 10*3/uL (ref 4.0–10.5)

## 2014-09-28 LAB — COMPREHENSIVE METABOLIC PANEL
ALBUMIN: 3.4 g/dL — AB (ref 3.5–5.2)
ALK PHOS: 70 U/L (ref 39–117)
ALT: 76 U/L — AB (ref 0–35)
ALT: 70 U/L — AB (ref 0–35)
ANION GAP: 12 (ref 5–15)
AST: 45 U/L — ABNORMAL HIGH (ref 0–37)
AST: 37 U/L (ref 0–37)
Albumin: 3 g/dL — ABNORMAL LOW (ref 3.5–5.2)
Alkaline Phosphatase: 67 U/L (ref 39–117)
Anion gap: 9 (ref 5–15)
BILIRUBIN TOTAL: 1.7 mg/dL — AB (ref 0.3–1.2)
BUN: 47 mg/dL — AB (ref 6–23)
BUN: 42 mg/dL — ABNORMAL HIGH (ref 6–23)
CALCIUM: 8.3 mg/dL — AB (ref 8.4–10.5)
CHLORIDE: 99 meq/L (ref 96–112)
CO2: 26 mmol/L (ref 19–32)
CO2: 29 mmol/L (ref 19–32)
Calcium: 8.1 mg/dL — ABNORMAL LOW (ref 8.4–10.5)
Chloride: 99 mEq/L (ref 96–112)
Creatinine, Ser: 1.51 mg/dL — ABNORMAL HIGH (ref 0.50–1.10)
Creatinine, Ser: 1.3 mg/dL — ABNORMAL HIGH (ref 0.50–1.10)
GFR calc Af Amer: 35 mL/min — ABNORMAL LOW (ref >90)
GFR calc Af Amer: 42 mL/min — ABNORMAL LOW (ref >90)
GFR calc non Af Amer: 30 mL/min — ABNORMAL LOW (ref >90)
GFR calc non Af Amer: 36 mL/min — ABNORMAL LOW (ref >90)
GLUCOSE: 122 mg/dL — AB (ref 70–99)
Glucose, Bld: 146 mg/dL — ABNORMAL HIGH (ref 70–99)
POTASSIUM: 3.1 mmol/L — AB (ref 3.5–5.1)
Potassium: 2.7 mmol/L — CL (ref 3.5–5.1)
SODIUM: 137 mmol/L (ref 135–145)
SODIUM: 137 mmol/L (ref 135–145)
TOTAL PROTEIN: 6.3 g/dL (ref 6.0–8.3)
Total Bilirubin: 2.2 mg/dL — ABNORMAL HIGH (ref 0.3–1.2)
Total Protein: 5.9 g/dL — ABNORMAL LOW (ref 6.0–8.3)

## 2014-09-28 LAB — GLUCOSE, CAPILLARY
Glucose-Capillary: 144 mg/dL — ABNORMAL HIGH (ref 70–99)
Glucose-Capillary: 139 mg/dL — ABNORMAL HIGH (ref 70–99)
Glucose-Capillary: 77 mg/dL (ref 70–99)

## 2014-09-28 LAB — APTT: aPTT: 42 seconds — ABNORMAL HIGH (ref 24–37)

## 2014-09-28 LAB — I-STAT CG4 LACTIC ACID, ED: Lactic Acid, Venous: 1.81 mmol/L (ref 0.5–2.2)

## 2014-09-28 LAB — URINE MICROSCOPIC-ADD ON

## 2014-09-28 LAB — HEMOGLOBIN A1C
Hgb A1c MFr Bld: 6.7 % — ABNORMAL HIGH (ref <5.7)
Mean Plasma Glucose: 146 mg/dL — ABNORMAL HIGH (ref <117)

## 2014-09-28 LAB — MAGNESIUM: Magnesium: 1.1 mg/dL — ABNORMAL LOW (ref 1.5–2.5)

## 2014-09-28 LAB — TSH: TSH: 0.692 u[IU]/mL (ref 0.350–4.500)

## 2014-09-28 LAB — STREP PNEUMONIAE URINARY ANTIGEN: STREP PNEUMO URINARY ANTIGEN: NEGATIVE

## 2014-09-28 LAB — PHOSPHORUS: PHOSPHORUS: 3.5 mg/dL (ref 2.3–4.6)

## 2014-09-28 LAB — PROTIME-INR
INR: 1.46 (ref 0.00–1.49)
PROTHROMBIN TIME: 17.9 s — AB (ref 11.6–15.2)

## 2014-09-28 LAB — HIV ANTIBODY (ROUTINE TESTING W REFLEX): HIV 1&2 Ab, 4th Generation: NONREACTIVE

## 2014-09-28 MED ORDER — POTASSIUM CHLORIDE CRYS ER 20 MEQ PO TBCR
20.0000 meq | EXTENDED_RELEASE_TABLET | Freq: Two times a day (BID) | ORAL | Status: DC
  Administered 2014-09-28 – 2014-10-05 (×14): 20 meq via ORAL
  Filled 2014-09-28 (×14): qty 1

## 2014-09-28 MED ORDER — INSULIN DETEMIR 100 UNIT/ML ~~LOC~~ SOLN
12.0000 [IU] | Freq: Every day | SUBCUTANEOUS | Status: DC
  Administered 2014-09-28 – 2014-09-30 (×3): 12 [IU] via SUBCUTANEOUS
  Filled 2014-09-28 (×4): qty 0.12

## 2014-09-28 MED ORDER — SODIUM CHLORIDE 0.9 % IJ SOLN
10.0000 mL | INTRAMUSCULAR | Status: DC | PRN
  Administered 2014-09-28: 20 mL
  Administered 2014-09-29 – 2014-10-05 (×5): 10 mL
  Filled 2014-09-28 (×6): qty 40

## 2014-09-28 MED ORDER — SODIUM CHLORIDE 0.9 % IV SOLN
INTRAVENOUS | Status: DC
  Administered 2014-09-28 – 2014-09-29 (×2): via INTRAVENOUS

## 2014-09-28 MED ORDER — PROCHLORPERAZINE MALEATE 10 MG PO TABS
10.0000 mg | ORAL_TABLET | Freq: Every day | ORAL | Status: DC
  Administered 2014-09-28 – 2014-10-05 (×8): 10 mg via ORAL
  Filled 2014-09-28 (×8): qty 1

## 2014-09-28 MED ORDER — MOMETASONE FUROATE 0.1 % EX CREA
1.0000 "application " | TOPICAL_CREAM | Freq: Every day | CUTANEOUS | Status: DC
  Administered 2014-09-29 – 2014-10-03 (×4): 1 via TOPICAL
  Filled 2014-09-28: qty 15

## 2014-09-28 MED ORDER — POTASSIUM CHLORIDE CRYS ER 20 MEQ PO TBCR
40.0000 meq | EXTENDED_RELEASE_TABLET | Freq: Once | ORAL | Status: AC
  Administered 2014-09-28: 40 meq via ORAL
  Filled 2014-09-28: qty 2

## 2014-09-28 MED ORDER — DESOXIMETASONE 0.25 % EX CREA: 1.0000 "application " | TOPICAL_CREAM | Freq: Two times a day (BID) | CUTANEOUS | Status: DC

## 2014-09-28 MED ORDER — TRAMADOL HCL 50 MG PO TABS
50.0000 mg | ORAL_TABLET | Freq: Two times a day (BID) | ORAL | Status: DC
  Administered 2014-09-28 – 2014-10-05 (×15): 50 mg via ORAL
  Filled 2014-09-28 (×15): qty 1

## 2014-09-28 MED ORDER — METOPROLOL SUCCINATE ER 50 MG PO TB24
50.0000 mg | ORAL_TABLET | Freq: Every morning | ORAL | Status: DC
  Administered 2014-09-28 – 2014-10-05 (×8): 50 mg via ORAL
  Filled 2014-09-28 (×8): qty 1

## 2014-09-28 MED ORDER — LORAZEPAM 0.5 MG PO TABS
0.5000 mg | ORAL_TABLET | Freq: Every day | ORAL | Status: DC
  Administered 2014-09-28 – 2014-10-04 (×7): 0.5 mg via ORAL
  Filled 2014-09-28 (×7): qty 1

## 2014-09-28 MED ORDER — SODIUM CHLORIDE 0.9 % IJ SOLN: 3.0000 mL | Freq: Two times a day (BID) | INTRAMUSCULAR | Status: DC

## 2014-09-28 MED ORDER — NISOLDIPINE ER 17 MG PO TB24
17.0000 mg | ORAL_TABLET | Freq: Every day | ORAL | Status: DC
  Administered 2014-09-28 – 2014-10-02 (×5): 17 mg via ORAL
  Filled 2014-09-28 (×5): qty 1

## 2014-09-28 MED ORDER — ONDANSETRON HCL 4 MG/2ML IJ SOLN
4.0000 mg | Freq: Four times a day (QID) | INTRAMUSCULAR | Status: DC | PRN
  Administered 2014-10-01 – 2014-10-04 (×2): 4 mg via INTRAVENOUS
  Filled 2014-09-28 (×2): qty 2

## 2014-09-28 MED ORDER — INSULIN ASPART 100 UNIT/ML ~~LOC~~ SOLN: 0.0000 [IU] | Freq: Every day | SUBCUTANEOUS | Status: DC

## 2014-09-28 MED ORDER — MAGNESIUM SULFATE 2 GM/50ML IV SOLN
2.0000 g | Freq: Once | INTRAVENOUS | Status: AC
  Administered 2014-09-28: 2 g via INTRAVENOUS
  Filled 2014-09-28: qty 50

## 2014-09-28 MED ORDER — INSULIN ASPART 100 UNIT/ML ~~LOC~~ SOLN
15.0000 [IU] | Freq: Three times a day (TID) | SUBCUTANEOUS | Status: DC
  Administered 2014-09-28 – 2014-09-30 (×4): 15 [IU] via SUBCUTANEOUS

## 2014-09-28 MED ORDER — DEXTROSE 5 % IV SOLN
500.0000 mg | INTRAVENOUS | Status: DC
  Administered 2014-09-28 – 2014-09-30 (×3): 500 mg via INTRAVENOUS
  Filled 2014-09-28 (×4): qty 500

## 2014-09-28 MED ORDER — BACITRACIN ZINC 500 UNIT/GM EX OINT
TOPICAL_OINTMENT | CUTANEOUS | Status: AC
  Administered 2014-09-28: 1
  Filled 2014-09-28: qty 0.9

## 2014-09-28 MED ORDER — ADULT MULTIVITAMIN W/MINERALS CH
1.0000 | ORAL_TABLET | Freq: Every day | ORAL | Status: DC
  Administered 2014-09-28 – 2014-10-05 (×8): 1 via ORAL
  Filled 2014-09-28 (×8): qty 1

## 2014-09-28 MED ORDER — ACETAMINOPHEN 500 MG PO TABS
1000.0000 mg | ORAL_TABLET | Freq: Four times a day (QID) | ORAL | Status: DC | PRN
  Administered 2014-09-28 – 2014-10-03 (×4): 1000 mg via ORAL
  Filled 2014-09-28 (×6): qty 2

## 2014-09-28 MED ORDER — IPRATROPIUM-ALBUTEROL 0.5-2.5 (3) MG/3ML IN SOLN: 3.0000 mL | RESPIRATORY_TRACT | Status: DC | PRN

## 2014-09-28 MED ORDER — SODIUM CHLORIDE 0.9 % IV SOLN
INTRAVENOUS | Status: DC
  Administered 2014-09-28 – 2014-09-29 (×2): via INTRAVENOUS

## 2014-09-28 MED ORDER — HYDROXYZINE PAMOATE 25 MG PO CAPS
25.0000 mg | ORAL_CAPSULE | ORAL | Status: DC | PRN
  Filled 2014-09-28: qty 1

## 2014-09-28 MED ORDER — HYDROXYZINE HCL 25 MG PO TABS
25.0000 mg | ORAL_TABLET | ORAL | Status: DC | PRN
  Administered 2014-10-02 – 2014-10-05 (×2): 25 mg via ORAL
  Filled 2014-09-28 (×3): qty 1

## 2014-09-28 MED ORDER — SODIUM CHLORIDE 0.9 % IV SOLN
INTRAVENOUS | Status: DC
  Administered 2014-09-28: 12:00:00 via INTRAVENOUS

## 2014-09-28 MED ORDER — ONDANSETRON HCL 4 MG PO TABS
4.0000 mg | ORAL_TABLET | Freq: Four times a day (QID) | ORAL | Status: DC | PRN
  Filled 2014-09-28: qty 1

## 2014-09-28 MED ORDER — DEXTROSE 5 % IV SOLN
1.0000 g | INTRAVENOUS | Status: DC
  Administered 2014-09-28 – 2014-09-30 (×3): 1 g via INTRAVENOUS
  Filled 2014-09-28 (×3): qty 10

## 2014-09-28 MED ORDER — ONE-DAILY MULTI VITAMINS PO TABS: 1.0000 | ORAL_TABLET | Freq: Every morning | ORAL | Status: DC

## 2014-09-28 MED ORDER — INSULIN ASPART 100 UNIT/ML ~~LOC~~ SOLN
0.0000 [IU] | Freq: Three times a day (TID) | SUBCUTANEOUS | Status: DC
  Administered 2014-09-28 – 2014-09-30 (×3): 2 [IU] via SUBCUTANEOUS

## 2014-09-28 MED ORDER — PANTOPRAZOLE SODIUM 40 MG IV SOLR
40.0000 mg | INTRAVENOUS | Status: DC
  Administered 2014-09-28 – 2014-10-02 (×5): 40 mg via INTRAVENOUS
  Filled 2014-09-28 (×6): qty 40

## 2014-09-28 NOTE — ED Provider Notes (Signed)
CSN: [id number]     Arrival date & time 09/28/14  0756 History   First MD Initiated Contact with Patient 09/28/14 0757     Chief Complaint  Patient presents with  . Fever  . Fall     (Consider location/radiation/quality/duration/timing/severity/associated sxs/prior Treatment) HPI Comments: Patient here complaining of fever 2 days that has been treated with Tylenol. She has had a nonproductive cough as well without vomiting or diarrhea. Last night when she went to the bathroom she lost her balance and fell onto her right arm. No loss of consciousness. Denies any neck pain. Did have a skin tear that was treated by EMS. Denies any back or hip pain. No urinary symptoms. She did use Tylenol just prior to arrival. Denies any pain at this time  Patient is a 80 y.o. female presenting with fever and fall. The history is provided by the patient.  Fever Fall    Past Medical History  Diagnosis Date  . Anemia, unspecified   . Hypertension   . Thyroid disease   . Hypercholesteremia   . MDS (myelodysplastic syndrome) with 5q deletion 07/11/2013  . Psoriasis 03/19/2014  . Type II or unspecified type diabetes mellitus without mention of complication, not stated as uncontrolled 04/16/2014  . Itching 05/21/2014  . Abdominal pain, acute, left upper quadrant 07/16/2014   Past Surgical History  Procedure Laterality Date  . Abdominal hysterectomy    . Back surgery    . Arthroscopy right knee    . Arthroscopy left knee     Family History  Problem Relation Age of Onset  . Colon cancer Daughter   . Heart disease Maternal Grandmother   . Heart disease Paternal Grandfather    History  Substance Use Topics  . Smoking status: Never Smoker   . Smokeless tobacco: Never Used  . Alcohol Use: No   OB History    No data available     Review of Systems  Constitutional: Positive for fever.  All other systems reviewed and are negative.     Allergies  Novocain; Oxycodone; Codeine; Methimazole;  and Revlimid  Home Medications   Prior to Admission medications   Medication Sig Start Date End Date Taking? Authorizing Provider  acetaminophen (TYLENOL) 500 MG tablet Take 1,000 mg by mouth every 6 (six) hours as needed (Pain).     Historical Provider, MD  desoximetasone (TOPICORT) 0.25 % cream Apply 1 application topically 2 (two) times daily as needed. For rash    Historical Provider, MD  hydrOXYzine (VISTARIL) 25 MG capsule Take 1 capsule (25 mg total) by mouth every 4 (four) hours as needed. 05/21/14   Heath Lark, MD  insulin aspart (NOVOLOG) 100 UNIT/ML injection Inject 15 Units into the skin 3 (three) times daily before meals. For Blood sugar over 200    Historical Provider, MD  insulin detemir (LEVEMIR) 100 UNIT/ML injection Inject 12 Units into the skin daily.     Historical Provider, MD  lidocaine-prilocaine (EMLA) cream  06/14/14   Historical Provider, MD  LORazepam (ATIVAN) 0.5 MG tablet Take 0.5 mg by mouth at bedtime.     Historical Provider, MD  metFORMIN (GLUCOPHAGE) 500 MG tablet Take 1 tablet (500 mg total) by mouth 2 (two) times daily with a meal. 04/16/14   Heath Lark, MD  metoprolol succinate (TOPROL-XL) 100 MG 24 hr tablet Take 50 mg by mouth every morning.     Historical Provider, MD  mometasone (ELOCON) 0.1 % cream Apply 1 application topically daily as needed.  Rash    Historical Provider, MD  mometasone (NASONEX) 50 MCG/ACT nasal spray Place 2 sprays into the nose 2 (two) times daily.    Historical Provider, MD  Multiple Vitamin (MULTIVITAMIN) tablet Take 1 tablet by mouth every morning.     Historical Provider, MD  nisoldipine (SULAR) 17 MG 24 hr tablet Take 17 mg by mouth daily.    Historical Provider, MD  olmesartan-hydrochlorothiazide (BENICAR HCT) 40-25 MG per tablet Take 1 tablet by mouth daily.    Historical Provider, MD  omeprazole (PRILOSEC) 40 MG capsule Take 1 capsule (40 mg total) by mouth daily. 07/16/14   Heath Lark, MD  ondansetron (ZOFRAN) 8 MG tablet Take  1 tablet (8 mg total) by mouth 2 (two) times daily as needed for nausea or vomiting. 09/24/14   Heath Lark, MD  potassium chloride SA (K-DUR,KLOR-CON) 20 MEQ tablet Take 1 tablet (20 mEq total) by mouth 2 (two) times daily. 07/02/14   Heath Lark, MD  predniSONE (DELTASONE) 10 MG tablet Take 1 tablet (10 mg total) by mouth daily with breakfast. 08/13/14   Heath Lark, MD  predniSONE (DELTASONE) 2.5 MG tablet Take 5 tablets (12.5 mg total) by mouth daily with breakfast. 09/10/14   Heath Lark, MD  prochlorperazine (COMPAZINE) 10 MG tablet  07/03/14   Historical Provider, MD  ranitidine (ZANTAC) 75 MG tablet Take 1 tablet (75 mg total) by mouth at bedtime. 07/16/14   Heath Lark, MD  scopolamine (TRANSDERM-SCOP) 1 MG/3DAYS Place 1 patch (1.5 mg total) onto the skin every 3 (three) days. 07/19/14   Heath Lark, MD  traMADol (ULTRAM) 50 MG tablet  04/19/14   Historical Provider, MD  triamcinolone cream (KENALOG) 0.1 %  03/01/14   Historical Provider, MD   BP 130/72 mmHg  Pulse 91  Temp(Src) 99.1 F (37.3 C) (Oral)  Resp 18  SpO2 95% Physical Exam  Constitutional: She is oriented to person, place, and time. She appears well-developed and well-nourished.  Non-toxic appearance. No distress.  HENT:  Head: Normocephalic and atraumatic.  Eyes: Conjunctivae, EOM and lids are normal. Pupils are equal, round, and reactive to light.  Neck: Normal range of motion. Neck supple. No tracheal deviation present. No thyroid mass present.  Cardiovascular: Normal rate, regular rhythm and normal heart sounds.  Exam reveals no gallop.   No murmur heard. Pulmonary/Chest: Effort normal and breath sounds normal. No stridor. No respiratory distress. She has no decreased breath sounds. She has no wheezes. She has no rhonchi. She has no rales.  Abdominal: Soft. Normal appearance and bowel sounds are normal. She exhibits no distension. There is no tenderness. There is no rebound and no CVA tenderness.  Musculoskeletal: Normal range  of motion. She exhibits no edema or tenderness.       Arms: Full range of motion at elbow  Neurological: She is alert and oriented to person, place, and time. She has normal strength. No cranial nerve deficit or sensory deficit. GCS eye subscore is 4. GCS verbal subscore is 5. GCS motor subscore is 6.  Skin: Skin is warm and dry. No abrasion and no rash noted.  Psychiatric: She has a normal mood and affect. Her speech is normal and behavior is normal.  Nursing note and vitals reviewed.   ED Course  Procedures (including critical care time) Labs Review Labs Reviewed  CULTURE, BLOOD (ROUTINE X 2)  CULTURE, BLOOD (ROUTINE X 2)  URINE CULTURE  CBC WITH DIFFERENTIAL  COMPREHENSIVE METABOLIC PANEL  URINALYSIS, ROUTINE W REFLEX MICROSCOPIC  I-STAT CG4 LACTIC ACID, ED    Imaging Review No results found.   EKG Interpretation None      MDM   Final diagnoses:  SOB (shortness of breath)   Patient to be admitted for evaluation of dehydration     Leota Jacobsen, MD 09/30/14 [phone]

## 2014-09-28 NOTE — Progress Notes (Signed)
CRITICAL VALUE ALERT  Critical value received:  Gram - rods in aerobic and anaerobic bottles  Date of notification:  09/28/2014  Time of notification:  2225  Critical value read back:Yes.    Nurse who received alert:  Carnella Guadalajara I  MD notified (1st page):  Fredirick Maudlin, NP  Time of first page:  2227  Responding MD:  Fredirick Maudlin, NP  Time MD responded:  2230  No new orders given at this time. Pt is on abx. Will continue to monitor closely.

## 2014-09-28 NOTE — ED Notes (Signed)
Phlebotomy at bedside.

## 2014-09-28 NOTE — ED Notes (Signed)
Per EMS pt complaint of fall resulting in skin tears to right elbow. Pt denies LOC. Pt alert and oriented x4. Pt fever starting last night. Pt given 1 gram of tylenol last night and at 0630 this morning. Pt had blood transfusion on Wednesday with generalized weakness since.

## 2014-09-28 NOTE — ED Notes (Signed)
Pt to DG.

## 2014-09-28 NOTE — ED Notes (Signed)
Bed: NV98 Expected date:  Expected time:  Means of arrival:  Comments: EMS- 80yo F, fall, weakness, fever

## 2014-09-28 NOTE — ED Notes (Signed)
Family at bedside. Pt encouraged to void for urine sample. Pt unable to void at present time.

## 2014-09-28 NOTE — ED Notes (Signed)
Pt attempt to void unsuccessful.

## 2014-09-28 NOTE — H&P (Addendum)
[facility] History and Physical  Rebekah Cross IFO:[id number] DOB: 11/02/1924 DOA: 09/28/2014  Referring physician: ED physician PCP: Mayra Neer, MD   Chief Complaint: fever   HPI:  Pt is 80 yo female who has been transfusion dependent since 2014 (follows with Dr. Alvy Bimler), per recent biopsy has low grade myelodysplastic syndrome (treated initially with Revlimide but developed an allergic reaction, after that started on Vidaza but also d/c due to weakness and frequent transfusion requirement), presented to [facility] ED via EMS with main concern of several days duration of exertional dyspnea associated with productive cough of yellow sputum, subjective fevers, chills, poor oral intake. In addition, pt noted urinary urgency and frequency. This has also been associated with weakness and gait instability. Pt denies any specific abd concerns, no chest pain, no focal neurological symptoms, no recent sick contacts or exposures.   In ED, pt noted to be hemodynamically stable, VS notable for T 99.1 F, RR 22 bpm, SBP 120, CXR unremarkable but PNA considered due to pt's symptoms, UA suggestive of UTI. TR[facility] asked to admit for management of presumptive sepsis due to the above. Telemetry bed requested.   Assessment and Plan: Principal Problem:   Sepsis - likely secondary to UTI but PNA can not be excluded based on pt's symptoms even in the absence of findings on CXR - placed on ABX Zithromax and Rocephin for now and plan on narrowing as clinically indicated - follow up on urine and blood culture, ask for respiratory viral panel as well  - provide IVF  Active Problems:   UTI (lower urinary tract infection) - ABX as noted above  - should be adequate in coverage    Acute respiratory failure with hypoxia - could be from URI vs PNA - treat with ABX as noted above - provide oxygen as needed, bronchodilators as needed    HTN (hypertension) - BP stable on admission    Anemia in neoplastic disease -  monitor Hg    MDS (myelodysplastic syndrome) with 5q deletion - will notify Dr. Alvy Bimler of pt's admission    CKD (chronic kidney disease) stage 3, GFR 30-59 ml/min - provide IVF and repeat BMP in AM   Diabetes mellitus with renal complications - continue Insulin per home medical regimen    Thrombocytopenia - from myelodysplastic syndrome - monitor blood counts    Hypokalemia - with low Mg as well - supplement and repeat BMP and Mg in AM  SCD's for DVT prophylaxis   Radiological Exams on Admission: Dg Chest 2 View  09/28/2014  No active cardiopulmonary disease.     Code Status: Full Family Communication:No family at bedside  Disposition Plan: Admit for further evaluation     Review of Systems:  Constitutional: Negative for diaphoresis.  HENT: Negative for hearing loss, ear pain, nosebleeds, congestion, sore throat, neck pain, tinnitus and ear discharge.   Eyes: Negative for blurred vision, double vision, photophobia, pain, discharge and redness.  Respiratory: Negative for wheezing and stridor.   Cardiovascular: Negative for chest pain, palpitations, orthopnea, claudication and leg swelling.  Gastrointestinal: Negative for heartburn, constipation, blood in stool and melena.  Genitourinary: Negative for hematuria and flank pain.  Musculoskeletal: Negative for myalgias, back pain, joint pain and falls.  Skin: Negative for itching and rash.  Neurological: Negative for tingling, tremors, sensory change, speech chang.  Endo/Heme/Allergies: Negative for environmental allergies and polydipsia. Does not bruise/bleed easily.  Psychiatric/Behavioral: Negative for suicidal ideas. The patient is not nervous/anxious.      Past Medical  History  Diagnosis Date  . Anemia, unspecified   . Hypertension   . Thyroid disease   . Hypercholesteremia   . MDS (myelodysplastic syndrome) with 5q deletion 07/11/2013  . Psoriasis 03/19/2014  . Type II or unspecified type diabetes mellitus without  mention of complication, not stated as uncontrolled 04/16/2014  . Itching 05/21/2014  . Abdominal pain, acute, left upper quadrant 07/16/2014    Past Surgical History  Procedure Laterality Date  . Abdominal hysterectomy    . Back surgery    . Arthroscopy right knee    . Arthroscopy left knee      Social History:  reports that she has never smoked. She has never used smokeless tobacco. She reports that she does not drink alcohol or use illicit drugs.  Allergies  Allergen Reactions  . Novocain [Procaine] Swelling and Rash  . Oxycodone Nausea And Vomiting and Other (See Comments)    "jerking"  . Codeine Nausea And Vomiting and Rash    unknown  . Methimazole [Thiamazole] Rash  . Revlimid [Lenalidomide] Rash    Family History  Problem Relation Age of Onset  . Colon cancer Daughter   . Heart disease Maternal Grandmother   . Heart disease Paternal Grandfather     Prior to Admission medications   Medication Sig Start Date End Date Taking? Authorizing Provider  acetaminophen (TYLENOL) 500 MG tablet Take 1,000 mg by mouth every 6 (six) hours as needed for fever (fever).    Yes Historical Provider, MD  hydrOXYzine (VISTARIL) 25 MG capsule Take 1 capsule (25 mg total) by mouth every 4 (four) hours as needed. 05/21/14  Yes Heath Lark, MD  insulin aspart (NOVOLOG) 100 UNIT/ML injection Inject 15 Units into the skin 3 (three) times daily before meals. For Blood sugar over 200   Yes Historical Provider, MD  insulin detemir (LEVEMIR) 100 UNIT/ML injection Inject 12 Units into the skin daily.    Yes Historical Provider, MD  LORazepam (ATIVAN) 0.5 MG tablet Take 0.5 mg by mouth at bedtime.    Yes Historical Provider, MD  metFORMIN (GLUCOPHAGE) 500 MG tablet Take 1 tablet (500 mg total) by mouth 2 (two) times daily with a meal. 04/16/14  Yes Heath Lark, MD  metoprolol succinate (TOPROL-XL) 100 MG 24 hr tablet Take 50 mg by mouth every morning.    Yes Historical Provider, MD  Multiple Vitamin  (MULTIVITAMIN) tablet Take 1 tablet by mouth every morning.    Yes Historical Provider, MD  nisoldipine (SULAR) 17 MG 24 hr tablet Take 17 mg by mouth daily.   Yes Historical Provider, MD  olmesartan-hydrochlorothiazide (BENICAR HCT) 40-25 MG per tablet Take 1 tablet by mouth daily.   Yes Historical Provider, MD  omeprazole (PRILOSEC) 40 MG capsule Take 1 capsule (40 mg total) by mouth daily. 07/16/14  Yes Heath Lark, MD  ondansetron (ZOFRAN) 8 MG tablet Take 1 tablet (8 mg total) by mouth 2 (two) times daily as needed for nausea or vomiting. 09/24/14  Yes Heath Lark, MD  potassium chloride SA (K-DUR,KLOR-CON) 20 MEQ tablet Take 1 tablet (20 mEq total) by mouth 2 (two) times daily. 07/02/14  Yes Heath Lark, MD  predniSONE (DELTASONE) 10 MG tablet Take 1 tablet (10 mg total) by mouth daily with breakfast. 08/13/14  Yes Heath Lark, MD  predniSONE (DELTASONE) 2.5 MG tablet Take 5 tablets (12.5 mg total) by mouth daily with breakfast. 09/10/14  Yes Heath Lark, MD  prochlorperazine (COMPAZINE) 10 MG tablet Take 10 mg by mouth daily.  07/03/14  Yes Historical Provider, MD  traMADol (ULTRAM) 50 MG tablet Take 50 mg by mouth 2 (two) times daily.  04/19/14  Yes Historical Provider, MD  desoximetasone (TOPICORT) 0.25 % cream Apply 1 application topically 2 (two) times daily as needed. For rash    Historical Provider, MD  mometasone (ELOCON) 0.1 % cream Apply 1 application topically daily as needed. Rash    Historical Provider, MD  ranitidine (ZANTAC) 75 MG tablet Take 1 tablet (75 mg total) by mouth at bedtime. Patient not taking: Reported on 09/28/2014 07/16/14   Heath Lark, MD  scopolamine (TRANSDERM-SCOP) 1 MG/3DAYS Place 1 patch (1.5 mg total) onto the skin every 3 (three) days. Patient not taking: Reported on 09/28/2014 07/19/14   Heath Lark, MD  triamcinolone cream (KENALOG) 0.1 %  03/01/14   Historical Provider, MD    Physical Exam: Filed Vitals:   09/28/14 1115 09/28/14 1128 09/28/14 1154 09/28/14 1445   BP: 133/56  129/46 128/46  Pulse: 86 89 85 88  Temp:  98.8 F (37.1 C) 98.5 F (36.9 C) 98.6 F (37 C)  TempSrc:  Oral Oral Oral  Resp: 24 24 24 22   Height:   5' (1.524 m)   Weight:   55.9 kg (123 lb 3.8 oz)   SpO2: 96% 94% 96% 97%    Physical Exam  Constitutional: Appears well-developed and well-nourished. No distress.  HENT: Normocephalic. External right and left ear normal. Dry MM Eyes: Conjunctivae and EOM are normal. PERRLA, no scleral icterus.  Neck: Normal ROM. Neck supple. No JVD. No tracheal deviation. No thyromegaly.  CVS: RRR, S1/S2 +, no gallops, no carotid bruit.  Pulmonary: Effort and breath sounds normal, no stridor, mild rhonchi at bases  Abdominal: Soft. BS +,  no distension, tenderness, rebound or guarding.  Musculoskeletal: Normal range of motion.  Lymphadenopathy: No lymphadenopathy noted, cervical, inguinal. Neuro: Alert. Normal reflexes, muscle tone coordination. No cranial nerve deficit. Skin: Skin is warm and dry. No rash noted. Not diaphoretic. No erythema. No pallor.  Psychiatric: Normal mood and affect.  Labs on Admission:  Basic Metabolic Panel:  Recent Labs Lab 09/28/14 0816 09/28/14 1230  NA 137 137  K 2.7* 3.1*  CL 99 99  CO2 26 29  GLUCOSE 122* 146*  BUN 47* 42*  CREATININE 1.51* 1.30*  CALCIUM 8.3* 8.1*  MG  --  1.1*  PHOS  --  3.5   Liver Function Tests:  Recent Labs Lab 09/28/14 0816 09/28/14 1230  AST 45* 37  ALT 76* 70*  ALKPHOS 70 67  BILITOT 2.2* 1.7*  PROT 6.3 5.9*  ALBUMIN 3.4* 3.0*   CBC:  Recent Labs Lab 09/24/14 0925 09/26/14 1049 09/28/14 0816 09/28/14 1230  WBC 8.0 16.0* 12.4* 9.2  NEUTROABS 5.9 13.7* 11.2* 8.1*  HGB 8.4* 7.3* 9.1* 8.3*  HCT 26.1* 21.9* 26.5* 24.4*  MCV 82.8 81.7 80.1 80.3  PLT 151 122* 71* 64*    EKG: Normal sinus rhythm, no ST/T wave changes  Faye Ramsay, MD  [facility] Pager [phone]  If 7PM-7AM, please contact night-coverage [email or web address] Password  [facility] 09/28/2014, 8:53 PM

## 2014-09-29 DIAGNOSIS — N183 Chronic kidney disease, stage 3 (moderate): Secondary | ICD-10-CM

## 2014-09-29 DIAGNOSIS — I10 Essential (primary) hypertension: Secondary | ICD-10-CM

## 2014-09-29 DIAGNOSIS — E860 Dehydration: Secondary | ICD-10-CM

## 2014-09-29 DIAGNOSIS — R7881 Bacteremia: Secondary | ICD-10-CM

## 2014-09-29 DIAGNOSIS — E1129 Type 2 diabetes mellitus with other diabetic kidney complication: Secondary | ICD-10-CM

## 2014-09-29 LAB — COMPREHENSIVE METABOLIC PANEL
ALBUMIN: 2.6 g/dL — AB (ref 3.5–5.2)
ALT: 73 U/L — ABNORMAL HIGH (ref 0–35)
ANION GAP: 7 (ref 5–15)
AST: 39 U/L — ABNORMAL HIGH (ref 0–37)
Alkaline Phosphatase: 85 U/L (ref 39–117)
BUN: 35 mg/dL — AB (ref 6–23)
CALCIUM: 7.6 mg/dL — AB (ref 8.4–10.5)
CO2: 26 mmol/L (ref 19–32)
CREATININE: 1.01 mg/dL (ref 0.50–1.10)
Chloride: 101 mEq/L (ref 96–112)
GFR calc Af Amer: 56 mL/min — ABNORMAL LOW (ref >90)
GFR calc non Af Amer: 49 mL/min — ABNORMAL LOW (ref >90)
Glucose, Bld: 106 mg/dL — ABNORMAL HIGH (ref 70–99)
POTASSIUM: 3.5 mmol/L (ref 3.5–5.1)
Sodium: 134 mmol/L — ABNORMAL LOW (ref 135–145)
Total Bilirubin: 2 mg/dL — ABNORMAL HIGH (ref 0.3–1.2)
Total Protein: 5.5 g/dL — ABNORMAL LOW (ref 6.0–8.3)

## 2014-09-29 LAB — CBC
HEMATOCRIT: 23.6 % — AB (ref 36.0–46.0)
Hemoglobin: 7.9 g/dL — ABNORMAL LOW (ref 12.0–15.0)
MCH: 27.2 pg (ref 26.0–34.0)
MCHC: 33.5 g/dL (ref 30.0–36.0)
MCV: 81.4 fL (ref 78.0–100.0)
Platelets: 54 10*3/uL — ABNORMAL LOW (ref 150–400)
RBC: 2.9 MIL/uL — AB (ref 3.87–5.11)
RDW: 15.1 % (ref 11.5–15.5)
WBC: 7.9 10*3/uL (ref 4.0–10.5)

## 2014-09-29 LAB — GLUCOSE, CAPILLARY
GLUCOSE-CAPILLARY: 109 mg/dL — AB (ref 70–99)
GLUCOSE-CAPILLARY: 78 mg/dL (ref 70–99)
Glucose-Capillary: 89 mg/dL (ref 70–99)
Glucose-Capillary: 88 mg/dL (ref 70–99)
Glucose-Capillary: 67 mg/dL — ABNORMAL LOW (ref 70–99)
Glucose-Capillary: 120 mg/dL — ABNORMAL HIGH (ref 70–99)

## 2014-09-29 LAB — MAGNESIUM: MAGNESIUM: 2 mg/dL (ref 1.5–2.5)

## 2014-09-29 MED ORDER — SODIUM CHLORIDE 0.9 % IV SOLN
INTRAVENOUS | Status: DC
  Administered 2014-09-29: 22:00:00 via INTRAVENOUS
  Administered 2014-09-30: 50 mL/h via INTRAVENOUS

## 2014-09-29 MED ORDER — BUDESONIDE 0.25 MG/2ML IN SUSP
0.2500 mg | Freq: Two times a day (BID) | RESPIRATORY_TRACT | Status: DC
  Administered 2014-09-29 – 2014-10-05 (×12): 0.25 mg via RESPIRATORY_TRACT
  Filled 2014-09-29 (×12): qty 2

## 2014-09-29 MED ORDER — BOOST / RESOURCE BREEZE PO LIQD
1.0000 | Freq: Two times a day (BID) | ORAL | Status: DC
  Administered 2014-09-29 – 2014-10-05 (×11): 1 via ORAL

## 2014-09-29 NOTE — Progress Notes (Signed)
INITIAL NUTRITION ASSESSMENT  DOCUMENTATION CODES Per approved criteria  -Not Applicable   INTERVENTION: -Provide Resource Breeze po BID, each supplement provides 250 kcal and 9 grams of protein -Encourage PO intake -RD to continue to monitor  NUTRITION DIAGNOSIS: Increased nutrient (protein) needs related to chemotherapy as evidenced by estimated nutritional needs.   Goal: Pt to meet >/= 90% of their estimated nutrition needs   Monitor:  PO and supplemental intake, weight, labs, I/O's  Reason for Assessment: Consult for nutritional assessment  Admitting Dx: Sepsis  ASSESSMENT: 80 yo female who has been transfusion dependent since 2014, per recent biopsy has low grade myelodysplastic syndrome. presented to [facility] ED via EMS with main concern of several days duration of exertional dyspnea associated with productive cough of yellow sputum, subjective fevers, chills, poor oral intake. In addition, pt noted urinary urgency and frequency.   Pt reports wt loss, did not specify the amount, Per weight history documentation, pt has lost 9 lb since April (6% weight loss x 8 months, insignificant for time frame).   Per H&P, pt had poor PO intake x several days. Pt states she didn't feel like eating d/t N/V.  PO intake: 50%.  Pt would benefit from nutritional supplementation, pt would like to try Resource Breeze supplements, RD to order BID. Pt states she does not like the regular Ensure supplements.  Nutrition focused physical exam shows no sign of depletion of muscle mass or body fat.  Labs reviewed: Low Na Elevated BUN Mg/Phos WNL  Height: Ht Readings from Last 1 Encounters:  09/28/14 5' (1.524 m)    Weight: Wt Readings from Last 1 Encounters:  09/29/14 131 lb 12.8 oz (59.784 kg)    Ideal Body Weight: 100 lb  % Ideal Body Weight: 131%  Wt Readings from Last 10 Encounters:  09/29/14 131 lb 12.8 oz (59.784 kg)  08/13/14 124 lb 1.6 oz (56.291 kg)  07/16/14 130 lb (58.968 kg)   06/27/14 124 lb 14.4 oz (56.654 kg)  06/18/14 133 lb 4.8 oz (60.464 kg)  05/21/14 132 lb 12.8 oz (60.238 kg)  04/23/14 133 lb 14.4 oz (60.737 kg)  04/16/14 131 lb 12.8 oz (59.784 kg)  03/19/14 137 lb 11.2 oz (62.46 kg)  02/19/14 139 lb 1.6 oz (63.095 kg)    Usual Body Weight: 140 lb  % Usual Body Weight: 94%  BMI:  Body mass index is 25.74 kg/(m^2).  Estimated Nutritional Needs: Kcal: 1800-2000 Protein: 85-95g Fluid: 1.8L/day  Skin: skin tear on elbow, ecchymosis  Diet Order: Diet regular  EDUCATION NEEDS:  -No education needs identified at this time   Intake/Output Summary (Last 24 hours) at 09/29/14 1038 Last data filed at 09/29/14 0854  Gross per 24 hour  Intake 1795.58 ml  Output    700 ml  Net 1095.58 ml    Last BM: 12/25  Labs:   Recent Labs Lab 09/28/14 0816 09/28/14 1230 09/29/14 0502  NA 137 137 134*  K 2.7* 3.1* 3.5  CL 99 99 101  CO2 26 29 26   BUN 47* 42* 35*  CREATININE 1.51* 1.30* 1.01  CALCIUM 8.3* 8.1* 7.6*  MG  --  1.1* 2.0  PHOS  --  3.5  --   GLUCOSE 122* 146* 106*    CBG (last 3)   Recent Labs  09/28/14 2228 09/29/14 0029 09/29/14 0736  GLUCAP 77 109* 89    Scheduled Meds: . azithromycin  500 mg Intravenous Q24H  . budesonide (PULMICORT) nebulizer solution  0.25 mg Nebulization  BID  . cefTRIAXone (ROCEPHIN)  IV  1 g Intravenous Q24H  . insulin aspart  0-15 Units Subcutaneous TID WC  . insulin aspart  0-5 Units Subcutaneous QHS  . insulin aspart  15 Units Subcutaneous TID AC  . insulin detemir  12 Units Subcutaneous Daily  . LORazepam  0.5 mg Oral QHS  . metoprolol succinate  50 mg Oral q morning - 10a  . mometasone  1 application Topical Daily  . multivitamin with minerals  1 tablet Oral Daily  . nisoldipine  17 mg Oral Daily  . pantoprazole (PROTONIX) IV  40 mg Intravenous Q24H  . potassium chloride SA  20 mEq Oral BID  . prochlorperazine  10 mg Oral Daily  . sodium chloride  3 mL Intravenous Q12H  . traMADol   50 mg Oral BID    Continuous Infusions: . sodium chloride 20 mL/hr at 09/28/14 6283    Past Medical History  Diagnosis Date  . Anemia, unspecified   . Hypertension   . Thyroid disease   . Hypercholesteremia   . MDS (myelodysplastic syndrome) with 5q deletion 07/11/2013  . Psoriasis 03/19/2014  . Type II or unspecified type diabetes mellitus without mention of complication, not stated as uncontrolled 04/16/2014  . Itching 05/21/2014  . Abdominal pain, acute, left upper quadrant 07/16/2014    Past Surgical History  Procedure Laterality Date  . Abdominal hysterectomy    . Back surgery    . Arthroscopy right knee    . Arthroscopy left knee      Clayton Bibles, MS, RD, LDN Pager: [phone] After Hours Pager: [phone]

## 2014-09-29 NOTE — Progress Notes (Signed)

## 2014-09-29 NOTE — Progress Notes (Signed)
[facility] PROGRESS NOTE  Rebekah Cross VFI:[id number] DOB: 07/10/1925 DOA: 09/28/2014 PCP: Mayra Neer, MD  Assessment/Plan: 1-sepsis/bacteremia due to Escherichia coli -Patient currently afebrile and feeling better -WBC is trending down and no further tachycardia appreciated on exam. At this moment we'll continue IV Rocephin and follow cultures sensitivity -Continue IV fluid resuscitation and supportive care -As needed antipyretics and antiemetics  2-shortness of breath/hypoxia: Presumed to be secondary to community acquired pneumonia/bronchitis -Continue Rocephin and Zithromax -Will repeat chest x-ray after patient has been fluid resuscitated -Will start Pulmicort and flutter valve -Continue PRN oxygen supplementation  3-essential hypertension: Blood pressure stable -will continue nisoldipine and metoprolol  4-GERD: Continue PPI  5-diabetes mellitus: Continue sliding scale insulin and Levemir  6-MDS with 5q deletion -Further treatment and recommendations per oncology service -Dr. Alvy Bimler has been informed of patient admission.  7-acute kidney injury superimposed on chronic kidney disease stage III -Most likely secondary to mild dehydration with UTI -Continue IV fluids -Creatinine trended down -We'll follow renal function.  8-hypokalemia: Will replete as needed   Code Status: Full code Family Communication: Daughter at bedside Disposition Plan: To be determined   Consultants:  None  Procedures:  See below for x-ray reports.  Antibiotics:  Rocephin and Zithromax 09/28/14  HPI/Subjective: Patient is feeling somewhat better this morning; still with shortness of breath sensation and desaturation with minimal activity. Positive blood cultures for gram-negative rods.  Objective: Filed Vitals:   09/29/14 0431  BP: 107/34  Pulse: 78  Temp: 98.5 F (36.9 C)  Resp: 20    Intake/Output Summary (Last 24 hours) at 09/29/14 1753 Last data filed at  09/29/14 1447  Gross per 24 hour  Intake 2271.25 ml  Output    575 ml  Net 1696.25 ml   Filed Weights   09/28/14 1154 09/29/14 0431  Weight: 55.9 kg (123 lb 3.8 oz) 59.784 kg (131 lb 12.8 oz)    Exam:   General:  Currently afebrile (but with high grade fever overnight), feeling slightly better, but still desaturation down to 87% on room air with minimal exertion  Cardiovascular: S1 and S2, no rubs, no gallops, sinus rhythm.  Respiratory: Scattered rhonchi, no crackles, slight end expiratory wheezes  Abdomen: Soft, nontender, nondistended, positive bowel sounds  Musculoskeletal: No edema, no cyanosis or clubbing  Data Reviewed: Basic Metabolic Panel:  Recent Labs Lab 09/28/14 0816 09/28/14 1230 09/29/14 0502  NA 137 137 134*  K 2.7* 3.1* 3.5  CL 99 99 101  CO2 26 29 26   GLUCOSE 122* 146* 106*  BUN 47* 42* 35*  CREATININE 1.51* 1.30* 1.01  CALCIUM 8.3* 8.1* 7.6*  MG  --  1.1* 2.0  PHOS  --  3.5  --    Liver Function Tests:  Recent Labs Lab 09/28/14 0816 09/28/14 1230 09/29/14 0502  AST 45* 37 39*  ALT 76* 70* 73*  ALKPHOS 70 67 85  BILITOT 2.2* 1.7* 2.0*  PROT 6.3 5.9* 5.5*  ALBUMIN 3.4* 3.0* 2.6*   CBC:  Recent Labs Lab 09/24/14 0925 09/26/14 1049 09/28/14 0816 09/28/14 1230 09/29/14 0502  WBC 8.0 16.0* 12.4* 9.2 7.9  NEUTROABS 5.9 13.7* 11.2* 8.1*  --   HGB 8.4* 7.3* 9.1* 8.3* 7.9*  HCT 26.1* 21.9* 26.5* 24.4* 23.6*  MCV 82.8 81.7 80.1 80.3 81.4  PLT 151 122* 71* 64* 54*   CBG:  Recent Labs Lab 09/29/14 0029 09/29/14 0736 09/29/14 1227 09/29/14 1658 09/29/14 1740  GLUCAP 109* 89 88 67* 78    Recent Results (  from the past 240 hour(s))  TECHNOLOGIST REVIEW     Status: None   Collection Time: 09/24/14  9:25 AM  Result Value Ref Range Status   Technologist Review Metas and Myelocytes present, 1% Blast, Giant PLTs  Final  Culture, blood (routine x 2)     Status: None (Preliminary result)   Collection Time: 09/28/14  8:16 AM   Result Value Ref Range Status   Specimen Description BLOOD RIGHT CHEST  Final   Special Requests BOTTLES DRAWN AEROBIC AND ANAEROBIC 4ML  Final   Culture   Final    GRAM NEGATIVE RODS Performed at [facility]    Report Status PENDING  Incomplete  Culture, blood (routine x 2)     Status: None (Preliminary result)   Collection Time: 09/28/14  8:38 AM  Result Value Ref Range Status   Specimen Description BLOOD LEFT ANTECUBITAL  Final   Special Requests BOTTLES DRAWN AEROBIC AND ANAEROBIC 5CC  Final   Culture   Final    GRAM NEGATIVE RODS Note: Gram Stain Report Called to,Read Back By and Verified With: JESSICA ASARO ON 09/29/2014 AT 12:06A BY WILEJ Performed at [facility]    Report Status PENDING  Incomplete  Urine culture     Status: None (Preliminary result)   Collection Time: 09/28/14  9:28 AM  Result Value Ref Range Status   Specimen Description URINE, RANDOM  Final   Special Requests NONE  Final   Colony Count   Final    >=100,000 COLONIES/ML Performed at [facility]    Culture   Final    ESCHERICHIA COLI Performed at [facility]    Report Status PENDING  Incomplete     Studies: Dg Chest 2 View  09/28/2014   CLINICAL DATA:  Recent fall, shortness of Breath  EXAM: CHEST  2 VIEW  COMPARISON:  08/27/2013  FINDINGS: A new right-sided chest wall port is noted in satisfactory position. The cardiac shadow is stable. The lungs are well aerated bilaterally without focal infiltrate. No acute bony abnormality is noted.  IMPRESSION: No active cardiopulmonary disease.   Electronically Signed   By: Inez Catalina M.D.   On: 09/28/2014 08:38    Scheduled Meds: . azithromycin  500 mg Intravenous Q24H  . budesonide (PULMICORT) nebulizer solution  0.25 mg Nebulization BID  . cefTRIAXone (ROCEPHIN)  IV  1 g Intravenous Q24H  . feeding supplement (RESOURCE BREEZE)  1 Container Oral BID BM  . insulin aspart  0-15 Units Subcutaneous TID WC  . insulin  aspart  0-5 Units Subcutaneous QHS  . insulin aspart  15 Units Subcutaneous TID AC  . insulin detemir  12 Units Subcutaneous Daily  . LORazepam  0.5 mg Oral QHS  . metoprolol succinate  50 mg Oral q morning - 10a  . mometasone  1 application Topical Daily  . multivitamin with minerals  1 tablet Oral Daily  . nisoldipine  17 mg Oral Daily  . pantoprazole (PROTONIX) IV  40 mg Intravenous Q24H  . potassium chloride SA  20 mEq Oral BID  . prochlorperazine  10 mg Oral Daily  . sodium chloride  3 mL Intravenous Q12H  . traMADol  50 mg Oral BID   Continuous Infusions: . sodium chloride 20 mL/hr at 09/29/14 1716    Principal Problem:   Sepsis Active Problems:   HTN (hypertension)   Anemia in neoplastic disease   MDS (myelodysplastic syndrome) with 5q deletion   Dehydration   CKD (  chronic kidney disease) stage 3, GFR 30-59 ml/min   Diabetes mellitus with renal complications   UTI (lower urinary tract infection)   Leukocytosis   Thrombocytopenia   Hypokalemia   Acute respiratory failure with hypoxia   CAP (community acquired pneumonia)    Time spent: 30 minutes    Barton Dubois  [facility] Pager [phone]. If 7PM-7AM, please contact night-coverage at [email or web address], password [facility] 09/29/2014, 5:53 PM  LOS: 1 day

## 2014-09-30 ENCOUNTER — Inpatient Hospital Stay (HOSPITAL_COMMUNITY): Payer: Medicare Other

## 2014-09-30 DIAGNOSIS — N189 Chronic kidney disease, unspecified: Secondary | ICD-10-CM

## 2014-09-30 DIAGNOSIS — N179 Acute kidney failure, unspecified: Secondary | ICD-10-CM

## 2014-09-30 LAB — CULTURE, BLOOD (ROUTINE X 2)

## 2014-09-30 LAB — GLUCOSE, CAPILLARY
Glucose-Capillary: 98 mg/dL (ref 70–99)
Glucose-Capillary: 121 mg/dL — ABNORMAL HIGH (ref 70–99)
Glucose-Capillary: 114 mg/dL — ABNORMAL HIGH (ref 70–99)

## 2014-09-30 LAB — URINE CULTURE: Colony Count: >=100000

## 2014-09-30 MED ORDER — INSULIN DETEMIR 100 UNIT/ML ~~LOC~~ SOLN
6.0000 [IU] | Freq: Every day | SUBCUTANEOUS | Status: DC
  Administered 2014-10-01 – 2014-10-05 (×5): 6 [IU] via SUBCUTANEOUS
  Filled 2014-09-30 (×5): qty 0.06

## 2014-09-30 MED ORDER — CIPROFLOXACIN IN D5W 400 MG/200ML IV SOLN
400.0000 mg | Freq: Two times a day (BID) | INTRAVENOUS | Status: DC
  Administered 2014-09-30 – 2014-10-03 (×6): 400 mg via INTRAVENOUS
  Filled 2014-09-30 (×7): qty 200

## 2014-09-30 MED ORDER — INSULIN ASPART 100 UNIT/ML ~~LOC~~ SOLN
0.0000 [IU] | Freq: Three times a day (TID) | SUBCUTANEOUS | Status: DC
Start: 2014-09-30 — End: 2014-10-05
  Administered 2014-10-01: 2 [IU] via SUBCUTANEOUS
  Administered 2014-10-01 – 2014-10-02 (×2): 1 [IU] via SUBCUTANEOUS
  Administered 2014-10-02: 2 [IU] via SUBCUTANEOUS
  Administered 2014-10-03: 1 [IU] via SUBCUTANEOUS
  Administered 2014-10-04: 2 [IU] via SUBCUTANEOUS

## 2014-09-30 NOTE — Progress Notes (Signed)
PROGRESS NOTE  Rebekah Cross XVQ:[id number] DOB: 03-20-1925 DOA: 09/28/2014 PCP: Mayra Neer, MD  Assessment/Plan:  sepsis/bacteremia due to Aeromonas -Patient currently afebrile and feeling better -WBC is trending down and no further tachycardia appreciated on exam. -Continue IV fluid resuscitation and supportive care -d/c ceftriaxone and azithromycin -start ciprofloxacin  Acute Respiratory Failure  -secondary to community acquired pneumonia/bronchitis -d/c Rocephin and Zithromax -repeat chest x-ray--pulm vascular congestion, basilar opacities -Will start Pulmicort and flutter valve -Continue PRN oxygen supplementation -saline lock IVF -check proBNP  essential hypertension:  -Blood pressure stable -will continue nisoldipine and metoprolol  GERD:  -Continue PPI  diabetes mellitus:  -Decrease Levemir to 6 units -Discontinue premeal novolog -change SSI to sensitive  MDS with 5q deletion -Further treatment and recommendations per oncology service -Dr. Alvy Bimler has been informed of patient admission.  acute kidney injury superimposed on chronic kidney disease stage III -Most likely secondary to mild dehydration with UTI -Continue IV fluids -Creatinine trended down -follow renal function.  hypokalemia:  -Will replete as needed   Code Status: Full code Family Communication: Daughter at bedside Disposition Plan: To be determined      Procedures/Studies: Dg Chest 2 View  09/30/2014   CLINICAL DATA:  Shortness of breath, fever and weakness for 4 days, hypertension, myelodysplastic syndrome, type II diabetes  EXAM: CHEST  2 VIEW  COMPARISON:  09/28/2014  FINDINGS: RIGHT jugular Port-A-Cath stable tip projecting over mid SVC.  Enlargement of cardiac silhouette with pulmonary vascular congestion.  Calcified mildly tortuous thoracic aorta.  Bibasilar pleural effusions and atelectasis.  Remaining lungs clear.  No pneumothorax.  IMPRESSION: Enlargement of  cardiac silhouette with pulmonary vascular congestion.  New bibasilar pleural effusions and atelectasis.   Electronically Signed   By: Lavonia Dana M.D.   On: 09/30/2014 15:16   Dg Chest 2 View  09/28/2014   CLINICAL DATA:  Recent fall, shortness of Breath  EXAM: CHEST  2 VIEW  COMPARISON:  08/27/2013  FINDINGS: A new right-sided chest wall port is noted in satisfactory position. The cardiac shadow is stable. The lungs are well aerated bilaterally without focal infiltrate. No acute bony abnormality is noted.  IMPRESSION: No active cardiopulmonary disease.   Electronically Signed   By: Inez Catalina M.D.   On: 09/28/2014 08:38         Subjective: patient still complains of some mild shortness of breath with exertion but it is improved. Denies any chest pain, abdominal pain, diarrhea, abdominal pain. She had an episode of nausea and vomiting today.  Objective: Filed Vitals:   09/29/14 2008 09/29/14 2120 09/30/14 0437 09/30/14 0847  BP:  134/49 130/79   Pulse:  90 90   Temp:  98.4 F (36.9 C) 98 F (36.7 C)   TempSrc:  Oral Oral   Resp:  18 20   Height:      Weight:   61.5 kg (135 lb 9.3 oz)   SpO2: 93% 93% 90% 94%    Intake/Output Summary (Last 24 hours) at 09/30/14 1556 Last data filed at 09/30/14 0700  Gross per 24 hour  Intake 904.33 ml  Output    200 ml  Net 704.33 ml   Weight change: 5.6 kg (12 lb 5.5 oz) Exam:   General:  Pt is alert, follows commands appropriately, not in acute distress  HEENT: No icterus, No thrush, No neck mass, [place]/AT  Cardiovascular: RRR, S1/S2, no rubs, no gallops  Respiratory: CTA bilaterally, no wheezing, no  crackles, no rhonchi  Abdomen: Soft/+BS, non tender, non distended, no guarding  Extremities: No edema, No lymphangitis, No petechiae, No rashes, no synovitis  Data Reviewed: Basic Metabolic Panel:  Recent Labs Lab 09/28/14 0816 09/28/14 1230 09/29/14 0502  NA 137 137 134*  K 2.7* 3.1* 3.5  CL 99 99 101  CO2 26 29 26     GLUCOSE 122* 146* 106*  BUN 47* 42* 35*  CREATININE 1.51* 1.30* 1.01  CALCIUM 8.3* 8.1* 7.6*  MG  --  1.1* 2.0  PHOS  --  3.5  --    Liver Function Tests:  Recent Labs Lab 09/28/14 0816 09/28/14 1230 09/29/14 0502  AST 45* 37 39*  ALT 76* 70* 73*  ALKPHOS 70 67 85  BILITOT 2.2* 1.7* 2.0*  PROT 6.3 5.9* 5.5*  ALBUMIN 3.4* 3.0* 2.6*   No results for input(s): LIPASE, AMYLASE in the last 168 hours. No results for input(s): AMMONIA in the last 168 hours. CBC:  Recent Labs Lab 09/24/14 0925 09/26/14 1049 09/28/14 0816 09/28/14 1230 09/29/14 0502  WBC 8.0 16.0* 12.4* 9.2 7.9  NEUTROABS 5.9 13.7* 11.2* 8.1*  --   HGB 8.4* 7.3* 9.1* 8.3* 7.9*  HCT 26.1* 21.9* 26.5* 24.4* 23.6*  MCV 82.8 81.7 80.1 80.3 81.4  PLT 151 122* 71* 64* 54*   Cardiac Enzymes: No results for input(s): CKTOTAL, CKMB, CKMBINDEX, TROPONINI in the last 168 hours. BNP: Invalid input(s): POCBNP CBG:  Recent Labs Lab 09/29/14 1658 09/29/14 1740 09/29/14 2116 09/30/14 0754 09/30/14 1202  GLUCAP 67* 78 120* 98 121*    Recent Results (from the past 240 hour(s))  TECHNOLOGIST REVIEW     Status: None   Collection Time: 09/24/14  9:25 AM  Result Value Ref Range Status   Technologist Review Metas and Myelocytes present, 1% Blast, Giant PLTs  Final  Culture, blood (routine x 2)     Status: None   Collection Time: 09/28/14  8:16 AM  Result Value Ref Range Status   Specimen Description BLOOD RIGHT CHEST  Final   Special Requests BOTTLES DRAWN AEROBIC AND ANAEROBIC 4ML  Final   Culture   Final    AEROMONAS HYDROPHILA GROUP Note: SUSCEPTIBILITIES PERFORMED ON PREVIOUS CULTURE WITHIN THE LAST 5 DAYS. Note: Gram Stain Report Called to,Read Back By and Verified With: JESSICA ASARO ON 09/28/2014 AT 10:23P BY WILEJ Performed at [facility]    Report Status 09/30/2014 FINAL  Final  Culture, blood (routine x 2)     Status: None   Collection Time: 09/28/14  8:38 AM  Result Value Ref Range  Status   Specimen Description BLOOD LEFT ANTECUBITAL  Final   Special Requests BOTTLES DRAWN AEROBIC AND ANAEROBIC 5CC  Final   Culture   Final    AEROMONAS HYDROPHILA GROUP Note: Gram Stain Report Called to,Read Back By and Verified With: JESSICA ASARO ON 09/29/2014 AT 12:06A BY WILEJ Performed at [facility]    Report Status 09/30/2014 FINAL  Final   Organism ID, Bacteria AEROMONAS HYDROPHILA GROUP  Final      Susceptibility   Aeromonas hydrophila group - MIC*    CEFTAZIDIME <=1 SENSITIVE Sensitive     CIPROFLOXACIN <=0.25 SENSITIVE Sensitive     GENTAMICIN <=1 SENSITIVE Sensitive     IMIPENEM 8 INTERMEDIATE Intermediate     PIP/TAZO 8 SENSITIVE Sensitive     TOBRAMYCIN <=1 SENSITIVE Sensitive     * AEROMONAS HYDROPHILA GROUP  Urine culture     Status: None  Collection Time: 09/28/14  9:28 AM  Result Value Ref Range Status   Specimen Description URINE, RANDOM  Final   Special Requests NONE  Final   Colony Count   Final    >=100,000 COLONIES/ML Performed at [facility]    Culture   Final    ESCHERICHIA COLI Performed at [facility]    Report Status 09/30/2014 FINAL  Final   Organism ID, Bacteria ESCHERICHIA COLI  Final      Susceptibility   Escherichia coli - MIC*    AMPICILLIN 8 SENSITIVE Sensitive     CEFAZOLIN <=4 SENSITIVE Sensitive     CEFTRIAXONE <=1 SENSITIVE Sensitive     CIPROFLOXACIN <=0.25 SENSITIVE Sensitive     GENTAMICIN <=1 SENSITIVE Sensitive     LEVOFLOXACIN <=0.12 SENSITIVE Sensitive     NITROFURANTOIN <=16 SENSITIVE Sensitive     TOBRAMYCIN <=1 SENSITIVE Sensitive     TRIMETH/SULFA <=20 SENSITIVE Sensitive     PIP/TAZO <=4 SENSITIVE Sensitive     * ESCHERICHIA COLI     Scheduled Meds: . azithromycin  500 mg Intravenous Q24H  . budesonide (PULMICORT) nebulizer solution  0.25 mg Nebulization BID  . cefTRIAXone (ROCEPHIN)  IV  1 g Intravenous Q24H  . feeding supplement (RESOURCE BREEZE)  1 Container Oral BID BM  .  insulin aspart  0-15 Units Subcutaneous TID WC  . insulin aspart  0-5 Units Subcutaneous QHS  . insulin aspart  15 Units Subcutaneous TID AC  . insulin detemir  12 Units Subcutaneous Daily  . LORazepam  0.5 mg Oral QHS  . metoprolol succinate  50 mg Oral q morning - 10a  . mometasone  1 application Topical Daily  . multivitamin with minerals  1 tablet Oral Daily  . nisoldipine  17 mg Oral Daily  . pantoprazole (PROTONIX) IV  40 mg Intravenous Q24H  . potassium chloride SA  20 mEq Oral BID  . prochlorperazine  10 mg Oral Daily  . sodium chloride  3 mL Intravenous Q12H  . traMADol  50 mg Oral BID   Continuous Infusions: . sodium chloride 50 mL/hr at 09/29/14 2144     Kyler Germer, DO  [facility] Pager [phone]  If 7PM-7AM, please contact night-coverage [email or web address] Password [email or web address] 09/30/2014, 3:56 PM   LOS: 2 days

## 2014-10-01 ENCOUNTER — Other Ambulatory Visit: Payer: Medicare Other

## 2014-10-01 ENCOUNTER — Other Ambulatory Visit: Payer: Self-pay | Admitting: Hematology and Oncology

## 2014-10-01 ENCOUNTER — Inpatient Hospital Stay (HOSPITAL_COMMUNITY): Payer: Medicare Other

## 2014-10-01 DIAGNOSIS — D696 Thrombocytopenia, unspecified: Secondary | ICD-10-CM

## 2014-10-01 DIAGNOSIS — A4189 Other specified sepsis: Secondary | ICD-10-CM

## 2014-10-01 DIAGNOSIS — D630 Anemia in neoplastic disease: Secondary | ICD-10-CM

## 2014-10-01 DIAGNOSIS — D46C Myelodysplastic syndrome with isolated del(5q) chromosomal abnormality: Secondary | ICD-10-CM

## 2014-10-01 DIAGNOSIS — J9600 Acute respiratory failure, unspecified whether with hypoxia or hypercapnia: Secondary | ICD-10-CM

## 2014-10-01 LAB — BASIC METABOLIC PANEL
Anion gap: 3 — ABNORMAL LOW (ref 5–15)
BUN: 20 mg/dL (ref 6–23)
CALCIUM: 7.8 mg/dL — AB (ref 8.4–10.5)
CHLORIDE: 109 meq/L (ref 96–112)
CO2: 26 mmol/L (ref 19–32)
CREATININE: 0.68 mg/dL (ref 0.50–1.10)
GFR calc Af Amer: 89 mL/min — ABNORMAL LOW (ref >90)
GFR calc non Af Amer: 77 mL/min — ABNORMAL LOW (ref >90)
Glucose, Bld: 97 mg/dL (ref 70–99)
Potassium: 4.2 mmol/L (ref 3.5–5.1)
Sodium: 138 mmol/L (ref 135–145)

## 2014-10-01 LAB — TYPE AND SCREEN
ABO/RH(D): A POS
ANTIBODY SCREEN: NEGATIVE
UNIT DIVISION: 0
Unit division: 0

## 2014-10-01 LAB — CBC
HEMATOCRIT: 23.9 % — AB (ref 36.0–46.0)
HEMOGLOBIN: 7.9 g/dL — AB (ref 12.0–15.0)
MCH: 26.9 pg (ref 26.0–34.0)
MCHC: 33.1 g/dL (ref 30.0–36.0)
MCV: 81.3 fL (ref 78.0–100.0)
Platelets: 77 10*3/uL — ABNORMAL LOW (ref 150–400)
RBC: 2.94 MIL/uL — ABNORMAL LOW (ref 3.87–5.11)
RDW: 15 % (ref 11.5–15.5)
WBC: 6.6 10*3/uL (ref 4.0–10.5)

## 2014-10-01 LAB — GLUCOSE, CAPILLARY
GLUCOSE-CAPILLARY: 100 mg/dL — AB (ref 70–99)
Glucose-Capillary: 145 mg/dL — ABNORMAL HIGH (ref 70–99)
Glucose-Capillary: 101 mg/dL — ABNORMAL HIGH (ref 70–99)
Glucose-Capillary: 151 mg/dL — ABNORMAL HIGH (ref 70–99)
Glucose-Capillary: 149 mg/dL — ABNORMAL HIGH (ref 70–99)

## 2014-10-01 LAB — PREPARE RBC (CROSSMATCH)

## 2014-10-01 LAB — BRAIN NATRIURETIC PEPTIDE: B Natriuretic Peptide: 888.4 pg/mL — ABNORMAL HIGH (ref 0.0–100.0)

## 2014-10-01 MED ORDER — DIPHENHYDRAMINE HCL 50 MG PO CAPS
50.0000 mg | ORAL_CAPSULE | Freq: Three times a day (TID) | ORAL | Status: DC | PRN
Start: 2014-10-01 — End: 2014-10-05
  Administered 2014-10-01 – 2014-10-04 (×2): 50 mg via ORAL
  Filled 2014-10-01 (×2): qty 1

## 2014-10-01 MED ORDER — IOHEXOL 300 MG/ML  SOLN: 50.0000 mL | Freq: Once | INTRAMUSCULAR | Status: AC | PRN

## 2014-10-01 MED ORDER — ACETAMINOPHEN 325 MG PO TABS
650.0000 mg | ORAL_TABLET | Freq: Four times a day (QID) | ORAL | Status: DC | PRN
  Administered 2014-10-01 – 2014-10-03 (×3): 650 mg via ORAL
  Filled 2014-10-01 (×3): qty 2

## 2014-10-01 MED ORDER — SODIUM CHLORIDE 0.9 % IV SOLN: Freq: Once | INTRAVENOUS | Status: DC

## 2014-10-01 NOTE — Consult Note (Signed)
[place]  [phone]   Requesting Provider: [facility] I have seen the patient, examined her and edited the notes as follows  Consulting Provider: Heath Lark, MD  Primary Oncologist: Heath Lark, [place]  Reason for Consultation: Myelodysplastic Syndrome  HPI: Rebekah Cross is an 80 year old woman with a history of low-grade myelodysplastic syndrome, transfusion dependent since 2014, admitted to the [facility] emergency department with a one-week history of progressive exertional dyspnea, yellow productive cough, generalized weakness with gait instability, subjective fever and chills, decreased appetite, intermittent nausea with one episode of vomiting, and complaining of urinary urgency and frequency as well. No confusion was reported. She had no bleeding issues such as epistaxis, hematemesis, hematochezia, or hematuria.  She had no other symptoms. On admission, a chest x-ray was negative for pneumonia. Urinalysis was suggestive of UTI. She was admitted to the telemetry bed. Cultures were obtained, returning positive for Aeromonas.  While initially on Zithromax and Rocephin IV, eventually changed to Cipro. Laboratory consistent with anemia with a hemoglobin of 7.9 requiring 1 unit of blood without a significant improvement in counts, and thrombocytopenia, with a platelet count of 55,000, now increased to 77,000 The patient reports feeling better this morning, with less dyspnea, improved cough. She is now afebrile.  We were kindly formed of the patient's admission to help in the management of her hematological issues.  SUMMARY OF ONCOLOGIC HISTORY:  This is a very pleasant 80 year old lady who become transfusion dependent since 2014. She denies any bleeding such as epistaxis, hematuria, or hematochezia. She had a bone marrow aspirate and biopsy performed recently which show she had a low grade myelodysplastic syndrome with 5Q  minus deletion. Erythropoietin stimulating agents were stopped when her erythropoietin level came back over 500 In October 2014, she was started on Revlimid however develop severe allergic reaction to Revlimid. That was discontinued. On 08/07/2013, we started her on Vidaza for 2 cycles. On 10/04/2013, repeat a bone marrow aspirate and biopsy showed persistent disease. Additional testing revealed that her disease is JAK 2 positive On 11/06/2013, Vidaza is resumed after her second opinion from [facility] On 02/08/2014, repeat bone marrow aspirate and biopsy showed no evidence of increased blasts On 04/16/2014, Vidaza is discontinued due to increased transfusion requirement and progressive weakness On 04/30/14: she was started on 100 mg danazol On 05/21/14: Danazol is increased to 200 mg daily. On 06/18/2014, danazol was discontinued. On 06/25/14: Rebekah Cross is added On 07/16/2014, Jakafi is on hold due to severe pancytopenia and uncontrolled nausea and vomiting She continues to receive intermittent transfusions for her anemia, to goal hemoglobin of 8   Past Medical History  Diagnosis Date  . Anemia, unspecified   . Hypertension   . Thyroid disease   . Hypercholesteremia   . MDS (myelodysplastic syndrome) with 5q deletion 07/11/2013  . Psoriasis 03/19/2014  . Type II or unspecified type diabetes mellitus without mention of complication, not stated as uncontrolled 04/16/2014  . Itching 05/21/2014  . Abdominal pain, acute, left upper quadrant 07/16/2014     MEDICATIONS:  Scheduled Meds: . budesonide (PULMICORT) nebulizer solution  0.25 mg Nebulization BID  . ciprofloxacin  400 mg Intravenous Q12H  . feeding supplement (RESOURCE BREEZE)  1 Container Oral BID BM  . insulin aspart  0-5 Units Subcutaneous QHS  . insulin aspart  0-9 Units Subcutaneous TID WC  . insulin detemir  6 Units Subcutaneous Daily  . LORazepam  0.5 mg Oral QHS  .  metoprolol succinate  50 mg Oral q morning - 10a  .  mometasone  1 application Topical Daily  . multivitamin with minerals  1 tablet Oral Daily  . nisoldipine  17 mg Oral Daily  . pantoprazole (PROTONIX) IV  40 mg Intravenous Q24H  . potassium chloride SA  20 mEq Oral BID  . prochlorperazine  10 mg Oral Daily  . sodium chloride  3 mL Intravenous Q12H  . traMADol  50 mg Oral BID   Continuous Infusions:  PRN Meds:.acetaminophen, hydrOXYzine, ipratropium-albuterol, ondansetron **OR** ondansetron (ZOFRAN) IV, sodium chloride  ALLERGIES:  Allergies  Allergen Reactions  . Novocain [Procaine] Swelling and Rash  . Oxycodone Nausea And Vomiting and Other (See Comments)    "jerking"  . Codeine Nausea And Vomiting and Rash    unknown  . Methimazole [Thiamazole] Rash  . Revlimid [Lenalidomide] Rash    Family History  Problem Relation Age of Onset  . Colon cancer Daughter   . Heart disease Maternal Grandmother   . Heart disease Paternal Grandfather      Past Surgical History  Procedure Laterality Date  . Abdominal hysterectomy    . Back surgery    . Arthroscopy right knee    . Arthroscopy left knee      History   Social History  . Marital Status: Widowed    Spouse Name: N/A    Number of Children: 4  . Years of Education: N/A   Occupational History  . RETIRED    Social History Main Topics  . Smoking status: Never Smoker   . Smokeless tobacco: Never Used  . Alcohol Use: No  . Drug Use: No  . Sexual Activity: Not on file   Other Topics Concern  . Not on file   Social History Narrative     PHYSICAL EXAMINATION:  Filed Vitals:   10/01/14 0651  BP: 154/57  Pulse: 90  Temp: 99.3 F (37.4 C)  Resp: 16      Intake/Output Summary (Last 24 hours) at 10/01/14 0714 Last data filed at 09/30/14 1840  Gross per 24 hour  Intake   1360 ml  Output      0 ml  Net   1360 ml    ECOG PERFORMANCE STATUS:2  GENERAL:alert, no distress and comfortable SKIN: skin color, texture, turgor are normal, no significant lesions.  Bilateral psoriatic rash present on both pretibial areas, this is not new EYES: normal, conjunctiva are pink and non-injected, sclera clear OROPHARYNX:no exudate, no erythema and lips, buccal mucosa, and tongue normal  NECK: supple, thyroid normal size, non-tender, without nodularity LYMPH:  no palpable lymphadenopathy in the cervical, axillary or inguinal LUNGS: clear to auscultation and percussion with normal breathing effort HEART: regular rate & rhythm and no murmurs and no lower extremity edema ABDOMEN:abdomen soft, non-tender and normal bowel sounds Musculoskeletal:no cyanosis of digits and no clubbing  PSYCH: alert & oriented x 3 with fluent speech NEURO: no focal motor/sensory deficits  LABORATORY/RADIOLOGY DATA:   Recent Labs Lab 09/24/14 0925 09/26/14 1049 09/28/14 0816 09/28/14 1230 09/29/14 0502 10/01/14 0414  WBC 8.0 16.0* 12.4* 9.2 7.9 6.6  HGB 8.4* 7.3* 9.1* 8.3* 7.9* 7.9*  HCT 26.1* 21.9* 26.5* 24.4* 23.6* 23.9*  PLT 151 122* 71* 64* 54* 77*  MCV 82.8 81.7 80.1 80.3 81.4 81.3  MCH 26.7 27.2 27.5 27.3 27.2 26.9  MCHC 32.2 33.3 34.3 34.0 33.5 33.1  RDW 13.7 13.7 14.9 15.0 15.1 15.0  LYMPHSABS 0.8* 0.6* 0.4* 0.4*  --   --  MONOABS 0.3 1.0* 0.4 0.3  --   --   EOSABS 0.8* 0.6* 0.4 0.3  --   --   BASOSABS 0.1 0.1 0.0 0.1  --   --     CMP    Recent Labs Lab 09/28/14 0816 09/28/14 1230 09/29/14 0502 10/01/14 0414  NA 137 137 134* 138  K 2.7* 3.1* 3.5 4.2  CL 99 99 101 109  CO2 26 29 26 26   GLUCOSE 122* 146* 106* 97  BUN 47* 42* 35* 20  CREATININE 1.51* 1.30* 1.01 0.68  CALCIUM 8.3* 8.1* 7.6* 7.8*  MG  --  1.1* 2.0  --   AST 45* 37 39*  --   ALT 76* 70* 73*  --   ALKPHOS 70 67 85  --   BILITOT 2.2* 1.7* 2.0*  --         Component Value Date/Time   BILITOT 2.0* 09/29/2014 0502   BILITOT 1.34* 07/23/2014 0920    Anemia panel:   No results for input(s): VITAMINB12, FOLATE, FERRITIN, TIBC, IRON, RETICCTPCT in the last 72 hours.   Recent  Labs  09/28/14 1230  TSH 0.692        Component Value Date/Time   ESRSEDRATE 8 08/26/2012 0625     Recent Labs Lab 09/28/14 1230  INR 1.46      Urinalysis    Component Value Date/Time   COLORURINE AMBER* 09/28/2014 0928   APPEARANCEUR CLOUDY* 09/28/2014 0928   LABSPEC 1.019 09/28/2014 0928   PHURINE 5.0 09/28/2014 0928   GLUCOSEU NEGATIVE 09/28/2014 0928   HGBUR TRACE* 09/28/2014 0928   BILIRUBINUR SMALL* 09/28/2014 0928   KETONESUR NEGATIVE 09/28/2014 0928   PROTEINUR 30* 09/28/2014 0928   UROBILINOGEN 2.0* 09/28/2014 0928   NITRITE NEGATIVE 09/28/2014 0928   LEUKOCYTESUR TRACE* 09/28/2014 0928    Drugs of Abuse     Component Value Date/Time   LABOPIA NONE DETECTED 08/25/2012 2015   COCAINSCRNUR NONE DETECTED 08/25/2012 2015   LABBENZ NONE DETECTED 08/25/2012 2015   AMPHETMU NONE DETECTED 08/25/2012 2015   THCU NONE DETECTED 08/25/2012 2015   LABBARB NONE DETECTED 08/25/2012 2015     Liver Function Tests:  Recent Labs Lab 09/28/14 0816 09/28/14 1230 09/29/14 0502  AST 45* 37 39*  ALT 76* 70* 73*  ALKPHOS 70 67 85  BILITOT 2.2* 1.7* 2.0*  PROT 6.3 5.9* 5.5*  ALBUMIN 3.4* 3.0* 2.6*   No results for input(s): LIPASE, AMYLASE in the last 168 hours. No results for input(s): AMMONIA in the last 168 hours.  CBG:  Recent Labs Lab 09/29/14 2116 09/30/14 0754 09/30/14 1202 09/30/14 1728 09/30/14 2039  GLUCAP 120* 98 121* 114* 145*   Hgb A1c  Recent Labs  09/28/14 1119  HGBA1C 6.7*    Thyroid function studies  Recent Labs  09/28/14 1230  TSH 0.692    Radiology Studies:  Dg Chest 2 View  09/30/2014   CLINICAL DATA:  Shortness of breath, fever and weakness for 4 days, hypertension, myelodysplastic syndrome, type II diabetes  EXAM: CHEST  2 VIEW  COMPARISON:  09/28/2014  FINDINGS: RIGHT jugular Port-A-Cath stable tip projecting over mid SVC.  Enlargement of cardiac silhouette with pulmonary vascular congestion.  Calcified mildly  tortuous thoracic aorta.  Bibasilar pleural effusions and atelectasis.  Remaining lungs clear.  No pneumothorax.  IMPRESSION: Enlargement of cardiac silhouette with pulmonary vascular congestion.  New bibasilar pleural effusions and atelectasis.   Electronically Signed   By: Lavonia Dana M.D.   On: 09/30/2014  15:16   Dg Chest 2 View  09/28/2014   CLINICAL DATA:  Recent fall, shortness of Breath  EXAM: CHEST  2 VIEW  COMPARISON:  08/27/2013  FINDINGS: A new right-sided chest wall port is noted in satisfactory position. The cardiac shadow is stable. The lungs are well aerated bilaterally without focal infiltrate. No acute bony abnormality is noted.  IMPRESSION: No active cardiopulmonary disease.   Electronically Signed   By: Inez Catalina M.D.   On: 09/28/2014 08:38    ASSESSMENT AND PLAN:  MDS (myelodysplastic syndrome) with 5q deletion Overall, she tolerated treatment very poorly. Continue to hold chemotherapy and continue supportive care only.  She will also continue on prednisone daily  Anemia in neoplastic disease This is likely due to recent treatment.  The patient denies recent history of bleeding such as epistaxis, hematuria or hematochezia.  She was symptomatic from the anemia on admission requiring transfusion. She received 1 unit on admission for a hemoglobin of 7.9, without significant response. Thus, she is to receive another unit this morning. The plan would be to give her blood whenever her hemoglobin dropped to less than 8 g  Thrombocytopenia Likely secondary to neoplastic disease, dilution, and acute infection, antibiotics This has improved since admission Continue to monitor, as no bleeding issues are present  Moderate nausea and vomiting suspect they might be an element of gastritis.  She will continue proton pump inhibitor.  Psoriasis This has flared up since recent prednisone taper.  Suspected Sepsis-bacteremia due to Aeromonas She was initially treated with IV  ceftriaxone and azithromycin, which them were switched to Cipro, along with hydration, with improvement of symptoms Appreciate the care given by the primary team  Acute respiratory failure Patient was placed on the antibiotics, Pulmicort and albuterol, oxygen and IV fluids, with good response Her symptoms are improved. Appreciate primary team Involvement  DVT prophylaxis On mechanical devices  Full code  Other medical issues such as diabetes mellitus, Chronic kidney disease, Hypokalemia  as per admitting team  Discharge planning Possible DC tomorrow if continues to improve. She has appointment to see me on 1/4   **Disclaimer: This note was dictated with voice recognition software. Similar sounding words can inadvertently be transcribed and this note may contain transcription errors which may not have been corrected upon publication of note.Sharene Butters E, PA-C 10/01/2014, 7:14 AM Bernette Seeman, MD 10/01/2014

## 2014-10-01 NOTE — Progress Notes (Signed)
PROGRESS NOTE  Rebekah Cross SNK:[id number] DOB: August 23, 1925 DOA: 09/28/2014 PCP: Mayra Neer, MD  Assessment/Plan: sepsis/bacteremia due to Aeromonas -Patient currently afebrile and feeling better -WBC is trending down and no further tachycardia appreciated on exam. -Continue IV fluid resuscitation and supportive care -d/c ceftriaxone and azithromycin -started ciprofloxacin 09/30/14  Acute Respiratory Failure  -secondary to community acquired pneumonia/bronchitis -d/c Rocephin and Zithromax -repeat chest x-ray--pulm vascular congestion, basilar opacities -Will start Pulmicort and flutter valve -Continue PRN oxygen supplementation -saline lock IVF -check proBNP--888 -obtain echocardiogram  Abdominal pain -LUQ pain acute onset on 12/27 -CT abdomen/pelvis in the setting of sepsis and bacteremia with associated N/V   Myelodysplastic syndrome/Anemia -transfuse PRBC per med onc -appreciate Med Onc followup  essential hypertension:  -Blood pressure stable -will continue nisoldipine and metoprolol  GERD:  -Continue PPI  diabetes mellitus:  -Decreased Levemir to 6 units -Discontinue premeal novolog -change SSI to sensitive  MDS with 5q deletion -Further treatment and recommendations per oncology service -Dr. Alvy Bimler has been informed of patient admission.  acute kidney injury superimposed on chronic kidney disease stage III -Most likely secondary to mild dehydration and sepsis -Continue IV fluids -Creatinine trended down -follow renal function.  hypokalemia:  -Will replete as needed   Code Status: Full code Family Communication: Daughter at bedside updated 12/27         Procedures/Studies: Dg Chest 2 View  09/30/2014   CLINICAL DATA:  Shortness of breath, fever and weakness for 4 days, hypertension, myelodysplastic syndrome, type II diabetes  EXAM: CHEST  2 VIEW  COMPARISON:  09/28/2014  FINDINGS: RIGHT jugular Port-A-Cath stable tip  projecting over mid SVC.  Enlargement of cardiac silhouette with pulmonary vascular congestion.  Calcified mildly tortuous thoracic aorta.  Bibasilar pleural effusions and atelectasis.  Remaining lungs clear.  No pneumothorax.  IMPRESSION: Enlargement of cardiac silhouette with pulmonary vascular congestion.  New bibasilar pleural effusions and atelectasis.   Electronically Signed   By: Lavonia Dana M.D.   On: 09/30/2014 15:16   Dg Chest 2 View  09/28/2014   CLINICAL DATA:  Recent fall, shortness of Breath  EXAM: CHEST  2 VIEW  COMPARISON:  08/27/2013  FINDINGS: A new right-sided chest wall port is noted in satisfactory position. The cardiac shadow is stable. The lungs are well aerated bilaterally without focal infiltrate. No acute bony abnormality is noted.  IMPRESSION: No active cardiopulmonary disease.   Electronically Signed   By: Inez Catalina M.D.   On: 09/28/2014 08:38         Subjective: Patient states that she is breathing better and coughing less. She still has some dyspnea on exertion. Denies any fevers, chills, chest pain, diarrhea. She has some left upper quadrant abdominal pain. Denies any dysuria or hematuria.She had another episode of nausea and vomiting today.   Objective: Filed Vitals:   10/01/14 1255 10/01/14 1330 10/01/14 1430 10/01/14 1520  BP: 142/54 137/56 146/54 140/48  Pulse: 86 82 78 82  Temp: 98.3 F (36.8 C) 98.4 F (36.9 C) 98.2 F (36.8 C) 97.9 F (36.6 C)  TempSrc: Oral Oral Oral Oral  Resp: 18 18 18 18   Height:      Weight:      SpO2: 96% 96% 95% 96%    Intake/Output Summary (Last 24 hours) at 10/01/14 1720 Last data filed at 10/01/14 1520  Gross per 24 hour  Intake   1095 ml  Output      0 ml  Net   1095 ml   Weight change: -2.6 kg (-5 lb 11.7 oz) Exam:   General:  Pt is alert, follows commands appropriately, not in acute distress  HEENT: No icterus, No thrush,[place]/AT  Cardiovascular: RRR, S1/S2, no rubs, no gallops  Respiratory: Bibasilar  crackles. No wheezing. Good air movement  Abdomen: Soft/+BS, non tender, non distended, no guarding  Extremities: trace LE edema, No lymphangitis, No petechiae, No rashes, no synovitis  Data Reviewed: Basic Metabolic Panel:  Recent Labs Lab 09/28/14 0816 09/28/14 1230 09/29/14 0502 10/01/14 0414  NA 137 137 134* 138  K 2.7* 3.1* 3.5 4.2  CL 99 99 101 109  CO2 26 29 26 26   GLUCOSE 122* 146* 106* 97  BUN 47* 42* 35* 20  CREATININE 1.51* 1.30* 1.01 0.68  CALCIUM 8.3* 8.1* 7.6* 7.8*  MG  --  1.1* 2.0  --   PHOS  --  3.5  --   --    Liver Function Tests:  Recent Labs Lab 09/28/14 0816 09/28/14 1230 09/29/14 0502  AST 45* 37 39*  ALT 76* 70* 73*  ALKPHOS 70 67 85  BILITOT 2.2* 1.7* 2.0*  PROT 6.3 5.9* 5.5*  ALBUMIN 3.4* 3.0* 2.6*   No results for input(s): LIPASE, AMYLASE in the last 168 hours. No results for input(s): AMMONIA in the last 168 hours. CBC:  Recent Labs Lab 09/26/14 1049 09/28/14 0816 09/28/14 1230 09/29/14 0502 10/01/14 0414  WBC 16.0* 12.4* 9.2 7.9 6.6  NEUTROABS 13.7* 11.2* 8.1*  --   --   HGB 7.3* 9.1* 8.3* 7.9* 7.9*  HCT 21.9* 26.5* 24.4* 23.6* 23.9*  MCV 81.7 80.1 80.3 81.4 81.3  PLT 122* 71* 64* 54* 77*   Cardiac Enzymes: No results for input(s): CKTOTAL, CKMB, CKMBINDEX, TROPONINI in the last 168 hours. BNP: Invalid input(s): POCBNP CBG:  Recent Labs Lab 09/30/14 1202 09/30/14 1728 09/30/14 2039 10/01/14 0740 10/01/14 1208  GLUCAP 121* 114* 145* 101* 151*    Recent Results (from the past 240 hour(s))  TECHNOLOGIST REVIEW     Status: None   Collection Time: 09/24/14  9:25 AM  Result Value Ref Range Status   Technologist Review Metas and Myelocytes present, 1% Blast, Giant PLTs  Final  Culture, blood (routine x 2)     Status: None   Collection Time: 09/28/14  8:16 AM  Result Value Ref Range Status   Specimen Description BLOOD RIGHT CHEST  Final   Special Requests BOTTLES DRAWN AEROBIC AND ANAEROBIC 4ML  Final   Culture    Final    AEROMONAS HYDROPHILA GROUP Note: SUSCEPTIBILITIES PERFORMED ON PREVIOUS CULTURE WITHIN THE LAST 5 DAYS. Note: Gram Stain Report Called to,Read Back By and Verified With: JESSICA ASARO ON 09/28/2014 AT 10:23P BY Dennard Nip Performed at [facility]    Report Status 09/30/2014 FINAL  Final  Culture, blood (routine x 2)     Status: None   Collection Time: 09/28/14  8:38 AM  Result Value Ref Range Status   Specimen Description BLOOD LEFT ANTECUBITAL  Final   Special Requests BOTTLES DRAWN AEROBIC AND ANAEROBIC 5CC  Final   Culture   Final    AEROMONAS HYDROPHILA GROUP Note: Gram Stain Report Called to,Read Back By and Verified With: JESSICA ASARO ON 09/29/2014 AT 12:06A BY Dennard Nip Performed at [facility]    Report Status 09/30/2014 FINAL  Final   Organism ID, Bacteria AEROMONAS HYDROPHILA GROUP  Final      Susceptibility   Aeromonas hydrophila group -  MIC*    CEFTAZIDIME <=1 SENSITIVE Sensitive     CIPROFLOXACIN <=0.25 SENSITIVE Sensitive     GENTAMICIN <=1 SENSITIVE Sensitive     IMIPENEM 8 INTERMEDIATE Intermediate     PIP/TAZO 8 SENSITIVE Sensitive     TOBRAMYCIN <=1 SENSITIVE Sensitive     * AEROMONAS HYDROPHILA GROUP  Urine culture     Status: None   Collection Time: 09/28/14  9:28 AM  Result Value Ref Range Status   Specimen Description URINE, RANDOM  Final   Special Requests NONE  Final   Colony Count   Final    >=100,000 COLONIES/ML Performed at [place]   Final    ESCHERICHIA COLI Performed at [facility]    Report Status 09/30/2014 FINAL  Final   Organism ID, Bacteria ESCHERICHIA COLI  Final      Susceptibility   Escherichia coli - MIC*    AMPICILLIN 8 SENSITIVE Sensitive     CEFAZOLIN <=4 SENSITIVE Sensitive     CEFTRIAXONE <=1 SENSITIVE Sensitive     CIPROFLOXACIN <=0.25 SENSITIVE Sensitive     GENTAMICIN <=1 SENSITIVE Sensitive     LEVOFLOXACIN <=0.12 SENSITIVE Sensitive     NITROFURANTOIN <=16 SENSITIVE  Sensitive     TOBRAMYCIN <=1 SENSITIVE Sensitive     TRIMETH/SULFA <=20 SENSITIVE Sensitive     PIP/TAZO <=4 SENSITIVE Sensitive     * ESCHERICHIA COLI     Scheduled Meds: . sodium chloride   Intravenous Once  . budesonide (PULMICORT) nebulizer solution  0.25 mg Nebulization BID  . ciprofloxacin  400 mg Intravenous Q12H  . feeding supplement (RESOURCE BREEZE)  1 Container Oral BID BM  . insulin aspart  0-5 Units Subcutaneous QHS  . insulin aspart  0-9 Units Subcutaneous TID WC  . insulin detemir  6 Units Subcutaneous Daily  . LORazepam  0.5 mg Oral QHS  . metoprolol succinate  50 mg Oral q morning - 10a  . mometasone  1 application Topical Daily  . multivitamin with minerals  1 tablet Oral Daily  . nisoldipine  17 mg Oral Daily  . pantoprazole (PROTONIX) IV  40 mg Intravenous Q24H  . potassium chloride SA  20 mEq Oral BID  . prochlorperazine  10 mg Oral Daily  . sodium chloride  3 mL Intravenous Q12H  . traMADol  50 mg Oral BID   Continuous Infusions:    Brown Dunlap, DO  [facility] Pager [phone]  If 7PM-7AM, please contact night-coverage [email or web address] Password [email or web address] 10/01/2014, 5:20 PM   LOS: 3 days

## 2014-10-02 DIAGNOSIS — I5041 Acute combined systolic (congestive) and diastolic (congestive) heart failure: Secondary | ICD-10-CM

## 2014-10-02 DIAGNOSIS — I359 Nonrheumatic aortic valve disorder, unspecified: Secondary | ICD-10-CM

## 2014-10-02 DIAGNOSIS — R5081 Fever presenting with conditions classified elsewhere: Secondary | ICD-10-CM

## 2014-10-02 DIAGNOSIS — A4151 Sepsis due to Escherichia coli [E. coli]: Secondary | ICD-10-CM

## 2014-10-02 DIAGNOSIS — N390 Urinary tract infection, site not specified: Secondary | ICD-10-CM

## 2014-10-02 DIAGNOSIS — B9629 Other Escherichia coli [E. coli] as the cause of diseases classified elsewhere: Secondary | ICD-10-CM

## 2014-10-02 LAB — CBC WITH DIFFERENTIAL/PLATELET
BASOS ABS: 0.1 10*3/uL (ref 0.0–0.1)
Basophils Relative: 1 % (ref 0–1)
EOS ABS: 0.9 10*3/uL — AB (ref 0.0–0.7)
Eosinophils Relative: 10 % — ABNORMAL HIGH (ref 0–5)
HCT: 27 % — ABNORMAL LOW (ref 36.0–46.0)
Hemoglobin: 9 g/dL — ABNORMAL LOW (ref 12.0–15.0)
Lymphocytes Relative: 8 % — ABNORMAL LOW (ref 12–46)
Lymphs Abs: 0.7 10*3/uL (ref 0.7–4.0)
MCH: 27.1 pg (ref 26.0–34.0)
MCHC: 33.3 g/dL (ref 30.0–36.0)
MCV: 81.3 fL (ref 78.0–100.0)
MONO ABS: 0.4 10*3/uL (ref 0.1–1.0)
Monocytes Relative: 4 % (ref 3–12)
NEUTROS PCT: 77 % (ref 43–77)
Neutro Abs: 7 10*3/uL (ref 1.7–7.7)
Platelets: 100 10*3/uL — ABNORMAL LOW (ref 150–400)
RBC: 3.32 MIL/uL — ABNORMAL LOW (ref 3.87–5.11)
RDW: 15.3 % (ref 11.5–15.5)
WBC: 9.1 10*3/uL (ref 4.0–10.5)

## 2014-10-02 LAB — COMPREHENSIVE METABOLIC PANEL
ALBUMIN: 2.9 g/dL — AB (ref 3.5–5.2)
ALK PHOS: 125 U/L — AB (ref 39–117)
ALT: 54 U/L — ABNORMAL HIGH (ref 0–35)
ANION GAP: 6 (ref 5–15)
AST: 22 U/L (ref 0–37)
BUN: 20 mg/dL (ref 6–23)
CALCIUM: 8.3 mg/dL — AB (ref 8.4–10.5)
CO2: 23 mmol/L (ref 19–32)
Chloride: 108 mEq/L (ref 96–112)
Creatinine, Ser: 0.76 mg/dL (ref 0.50–1.10)
GFR calc non Af Amer: 74 mL/min — ABNORMAL LOW (ref >90)
GFR, EST AFRICAN AMERICAN: 85 mL/min — AB (ref >90)
GLUCOSE: 143 mg/dL — AB (ref 70–99)
POTASSIUM: 4.7 mmol/L (ref 3.5–5.1)
Sodium: 137 mmol/L (ref 135–145)
TOTAL PROTEIN: 6 g/dL (ref 6.0–8.3)
Total Bilirubin: 1.4 mg/dL — ABNORMAL HIGH (ref 0.3–1.2)

## 2014-10-02 LAB — RESPIRATORY VIRUS PANEL
Adenovirus: NOT DETECTED
Influenza A H1: NOT DETECTED
Influenza A H3: NOT DETECTED
Influenza A: NOT DETECTED
Influenza B: NOT DETECTED
Metapneumovirus: NOT DETECTED
PARAINFLUENZA 1 A: NOT DETECTED
Parainfluenza 2: NOT DETECTED
Parainfluenza 3: NOT DETECTED
RESPIRATORY SYNCYTIAL VIRUS A: NOT DETECTED
RESPIRATORY SYNCYTIAL VIRUS B: NOT DETECTED
Rhinovirus: NOT DETECTED

## 2014-10-02 LAB — GLUCOSE, CAPILLARY
GLUCOSE-CAPILLARY: 125 mg/dL — AB (ref 70–99)
GLUCOSE-CAPILLARY: 82 mg/dL (ref 70–99)
Glucose-Capillary: 202 mg/dL — ABNORMAL HIGH (ref 70–99)

## 2014-10-02 LAB — TYPE AND SCREEN
ABO/RH(D): A POS
ANTIBODY SCREEN: NEGATIVE
Unit division: 0

## 2014-10-02 MED ORDER — FUROSEMIDE 10 MG/ML IJ SOLN
40.0000 mg | Freq: Once | INTRAMUSCULAR | Status: AC
  Administered 2014-10-02: 40 mg via INTRAVENOUS
  Filled 2014-10-02: qty 4

## 2014-10-02 NOTE — Progress Notes (Signed)
Echocardiogram 2D Echocardiogram has been performed.  Marion Seese 10/02/2014, 10:45 AM

## 2014-10-02 NOTE — Progress Notes (Signed)
Respiratory panel results negative. Droplet precautions d/c. Patient and family informed.

## 2014-10-02 NOTE — Progress Notes (Signed)
SATURATION QUALIFICATIONS: (This note is used to comply with regulatory documentation for home oxygen)  Patient Saturations on Room Air at Rest = 92%  Patient Saturations on Room Air while Ambulating = 88%  Patient Saturations on 2 Liters of oxygen while Ambulating = 92%  Please briefly explain why patient needs home oxygen:  Patient oxygen level dropped to 88% on RA while ambulating. Patient denies SOB

## 2014-10-02 NOTE — Progress Notes (Signed)
Rebekah Cross   DOB:[birth year]   OH#:[id number]   IRS#:[id number]  Patient Care Team: Mayra Neer, MD as PCP - General (Family Medicine) Heath Lark, MD as Consulting Physician (Hematology and Oncology) I have seen the patient, examined her and edited the notes as follows  Subjective: Feeling well this morning after her transfusion of blood on 12/28. Wants to go home.Low grade fever without chills or night sweats. Denies any shortness of breath, cough or chest pain. Denies any nausea, vomiting or diarrhea. Left upper quadrant pain less painful. She denies any bleeding issues such as epistaxis, hematemesis, hematochezia or hematuria. Ambulates with assistance.   Scheduled Meds: . sodium chloride   Intravenous Once  . budesonide (PULMICORT) nebulizer solution  0.25 mg Nebulization BID  . ciprofloxacin  400 mg Intravenous Q12H  . feeding supplement (RESOURCE BREEZE)  1 Container Oral BID BM  . insulin aspart  0-5 Units Subcutaneous QHS  . insulin aspart  0-9 Units Subcutaneous TID WC  . insulin detemir  6 Units Subcutaneous Daily  . LORazepam  0.5 mg Oral QHS  . metoprolol succinate  50 mg Oral q morning - 10a  . mometasone  1 application Topical Daily  . multivitamin with minerals  1 tablet Oral Daily  . nisoldipine  17 mg Oral Daily  . pantoprazole (PROTONIX) IV  40 mg Intravenous Q24H  . potassium chloride SA  20 mEq Oral BID  . prochlorperazine  10 mg Oral Daily  . sodium chloride  3 mL Intravenous Q12H  . traMADol  50 mg Oral BID   Continuous Infusions:  PRN Meds:acetaminophen, acetaminophen, diphenhydrAMINE, hydrOXYzine, ipratropium-albuterol, ondansetron **OR** ondansetron (ZOFRAN) IV, sodium chloride   Objective:  Filed Vitals:   10/02/14 0503  BP: 164/62  Pulse: 96  Temp: 99.6 F (37.6 C)  Resp: 20      Intake/Output Summary (Last 24 hours) at 10/02/14 0732 Last data filed at 10/02/14 0600  Gross per 24 hour  Intake   1215 ml  Output      0 ml  Net   1215 ml     GENERAL:alert, no distress and comfortable SKIN: skin color, texture, turgor are normal, no rashes EYES: normal, conjunctiva are pink and non-injected, sclera clear OROPHARYNX:no exudate, no erythema and lips, buccal mucosa, and tongue normal  NECK: supple, thyroid normal size, non-tender, without nodularity LYMPH:  no palpable lymphadenopathy in the cervical, axillary or inguinal LUNGS: clear to auscultation and percussion with normal breathing effort HEART: regular rate & rhythm and no murmurs and no lower extremity edema ABDOMEN:abdomen soft, tender at the left flank, and normal bowel sounds Musculoskeletal:no cyanosis of digits and no clubbing  PSYCH: alert & oriented x 3 with fluent speech NEURO: no focal motor/sensory deficits    CBG (last 3)   Recent Labs  10/01/14 1656 10/01/14 2134 10/02/14 0657  GLUCAP 149* 100* 125*     Labs:   Recent Labs Lab 09/26/14 1049 09/28/14 0816 09/28/14 1230 09/29/14 0502 10/01/14 0414 10/02/14 0400  WBC 16.0* 12.4* 9.2 7.9 6.6 9.1  HGB 7.3* 9.1* 8.3* 7.9* 7.9* 9.0*  HCT 21.9* 26.5* 24.4* 23.6* 23.9* 27.0*  PLT 122* 71* 64* 54* 77* 100*  MCV 81.7 80.1 80.3 81.4 81.3 81.3  MCH 27.2 27.5 27.3 27.2 26.9 27.1  MCHC 33.3 34.3 34.0 33.5 33.1 33.3  RDW 13.7 14.9 15.0 15.1 15.0 15.3  LYMPHSABS 0.6* 0.4* 0.4*  --   --  0.7  MONOABS 1.0* 0.4 0.3  --   --  0.4  EOSABS 0.6* 0.4 0.3  --   --  0.9*  BASOSABS 0.1 0.0 0.1  --   --  0.1     Chemistries:    Recent Labs Lab 09/28/14 0816 09/28/14 1230 09/29/14 0502 10/01/14 0414 10/02/14 0500  NA 137 137 134* 138 137  K 2.7* 3.1* 3.5 4.2 4.7  CL 99 99 101 109 108  CO2 26 29 26 26 23   GLUCOSE 122* 146* 106* 97 143*  BUN 47* 42* 35* 20 20  CREATININE 1.51* 1.30* 1.01 0.68 0.76  CALCIUM 8.3* 8.1* 7.6* 7.8* 8.3*  MG  --  1.1* 2.0  --   --   AST 45* 37 39*  --  22  ALT 76* 70* 73*  --  54*  ALKPHOS 70 67 85  --  125*  BILITOT 2.2* 1.7* 2.0*  --  1.4*    GFR Estimated  Creatinine Clearance: 39.7 mL/min (by C-G formula based on Cr of 0.76).  Liver Function Tests:  Recent Labs Lab 09/28/14 0816 09/28/14 1230 09/29/14 0502 10/02/14 0500  AST 45* 37 39* 22  ALT 76* 70* 73* 54*  ALKPHOS 70 67 85 125*  BILITOT 2.2* 1.7* 2.0* 1.4*  PROT 6.3 5.9* 5.5* 6.0  ALBUMIN 3.4* 3.0* 2.6* 2.9*   No results for input(s): LIPASE, AMYLASE in the last 168 hours. No results for input(s): AMMONIA in the last 168 hours.  Urine Studies     Component Value Date/Time   COLORURINE AMBER* 09/28/2014 0928   APPEARANCEUR CLOUDY* 09/28/2014 0928   LABSPEC 1.019 09/28/2014 0928   PHURINE 5.0 09/28/2014 0928   GLUCOSEU NEGATIVE 09/28/2014 0928   HGBUR TRACE* 09/28/2014 0928   BILIRUBINUR SMALL* 09/28/2014 0928   KETONESUR NEGATIVE 09/28/2014 0928   PROTEINUR 30* 09/28/2014 0928   UROBILINOGEN 2.0* 09/28/2014 0928   NITRITE NEGATIVE 09/28/2014 0928   LEUKOCYTESUR TRACE* 09/28/2014 0928    Coagulation profile  Recent Labs Lab 09/28/14 1230  INR 1.46    Cardiac Enzymes: No results for input(s): CKTOTAL, CKMB, CKMBINDEX, TROPONINI in the last 168 hours. BNP: Invalid input(s): POCBNP CBG:  Recent Labs Lab 10/01/14 0740 10/01/14 1208 10/01/14 1656 10/01/14 2134 10/02/14 0657  GLUCAP 101* 151* 149* 100* 125*    Recent Results (from the past 240 hour(s))  TECHNOLOGIST REVIEW     Status: None   Collection Time: 09/24/14  9:25 AM  Result Value Ref Range Status   Technologist Review Metas and Myelocytes present, 1% Blast, Giant PLTs  Final  Culture, blood (routine x 2)     Status: None   Collection Time: 09/28/14  8:16 AM  Result Value Ref Range Status   Specimen Description BLOOD RIGHT CHEST  Final   Special Requests BOTTLES DRAWN AEROBIC AND ANAEROBIC 4ML  Final   Culture   Final    AEROMONAS HYDROPHILA GROUP Note: SUSCEPTIBILITIES PERFORMED ON PREVIOUS CULTURE WITHIN THE LAST 5 DAYS. Note: Gram Stain Report Called to,Read Back By and Verified With:  JESSICA ASARO ON 09/28/2014 AT 10:23P BY Dennard Nip Performed at [facility]    Report Status 09/30/2014 FINAL  Final  Culture, blood (routine x 2)     Status: None   Collection Time: 09/28/14  8:38 AM  Result Value Ref Range Status   Specimen Description BLOOD LEFT ANTECUBITAL  Final   Special Requests BOTTLES DRAWN AEROBIC AND ANAEROBIC 5CC  Final   Culture   Final    AEROMONAS HYDROPHILA GROUP Note: Gram Stain Report Called to,Read  Back By and Verified With: JESSICA ASARO ON 09/29/2014 AT 12:06A BY WILEJ Performed at [facility]    Report Status 09/30/2014 FINAL  Final   Organism ID, Bacteria AEROMONAS HYDROPHILA GROUP  Final      Susceptibility   Aeromonas hydrophila group - MIC*    CEFTAZIDIME <=1 SENSITIVE Sensitive     CIPROFLOXACIN <=0.25 SENSITIVE Sensitive     GENTAMICIN <=1 SENSITIVE Sensitive     IMIPENEM 8 INTERMEDIATE Intermediate     PIP/TAZO 8 SENSITIVE Sensitive     TOBRAMYCIN <=1 SENSITIVE Sensitive     * AEROMONAS HYDROPHILA GROUP  Urine culture     Status: None   Collection Time: 09/28/14  9:28 AM  Result Value Ref Range Status   Specimen Description URINE, RANDOM  Final   Special Requests NONE  Final   Colony Count   Final    >=100,000 COLONIES/ML Performed at [facility]    Culture   Final    ESCHERICHIA COLI Performed at [facility]    Report Status 09/30/2014 FINAL  Final   Organism ID, Bacteria ESCHERICHIA COLI  Final      Susceptibility   Escherichia coli - MIC*    AMPICILLIN 8 SENSITIVE Sensitive     CEFAZOLIN <=4 SENSITIVE Sensitive     CEFTRIAXONE <=1 SENSITIVE Sensitive     CIPROFLOXACIN <=0.25 SENSITIVE Sensitive     GENTAMICIN <=1 SENSITIVE Sensitive     LEVOFLOXACIN <=0.12 SENSITIVE Sensitive     NITROFURANTOIN <=16 SENSITIVE Sensitive     TOBRAMYCIN <=1 SENSITIVE Sensitive     TRIMETH/SULFA <=20 SENSITIVE Sensitive     PIP/TAZO <=4 SENSITIVE Sensitive     * ESCHERICHIA COLI       Imaging Studies:   Dg Chest 2 View  09/30/2014   CLINICAL DATA:  Shortness of breath, fever and weakness for 4 days, hypertension, myelodysplastic syndrome, type II diabetes  EXAM: CHEST  2 VIEW  COMPARISON:  09/28/2014  FINDINGS: RIGHT jugular Port-A-Cath stable tip projecting over mid SVC.  Enlargement of cardiac silhouette with pulmonary vascular congestion.  Calcified mildly tortuous thoracic aorta.  Bibasilar pleural effusions and atelectasis.  Remaining lungs clear.  No pneumothorax.  IMPRESSION: Enlargement of cardiac silhouette with pulmonary vascular congestion.  New bibasilar pleural effusions and atelectasis.   Electronically Signed   By: Lavonia Dana M.D.   On: 09/30/2014 15:16    Assessment/Plan: 80 y.o.  MDS (myelodysplastic syndrome) with 5q deletion Overall, she tolerated treatment very poorly. Continue to hold chemotherapy and continue supportive care only.  She will also continue on prednisone daily  Anemia in neoplastic disease This is likely due to recent treatment.  The patient denies recent history of bleeding such as epistaxis, hematuria or hematochezia.  She was symptomatic from the anemia on admission requiring transfusion. She received 2 units of blood to date with good response, today at 9.0.  The plan would be to give her blood whenever her hemoglobin dropped to less than 8 g  Thrombocytopenia Likely secondary to neoplastic disease, dilution, and acute infection, antibiotics This has improved since admission Continue to monitor, as no bleeding issues are present  Moderate nausea and vomiting suspect they might be an element of gastritis. She will continue proton pump inhibitor.  Psoriasis This has flared up since recent prednisone taper.  Suspected Sepsis-bacteremia due to Aeromonas E Coli UTI She was initially treated with IV ceftriaxone and azithromycin, which them were switched to Cipro, along with hydration, with improvement  of symptoms Changes in antibiotics as per  admitting team, can be switched to PO  Left Upper Quadrant Pain In the setting of UTI, acute kidney injury on chronic kidney disease This improved since prior day CT of the Abdomen and Pelvis on 12/28 to rule out malignancy pending    Low grade fever Likely due to infection Continue antibiotics and supportive care.  Acute respiratory failure Patient was placed on the antibiotics, Pulmicort and albuterol, oxygen and IV fluids, with good response Her symptoms are improved. Appreciate primary team Involvement  DVT prophylaxis On mechanical devices  Full code  Disposition:  To home today if clinically stable. Patient has an appointment with Dr. Alvy Bimler on 1/4 at 10 a.m (labs at 9:30 am)   Other medical issues such as diabetes mellitus, Chronic kidney disease, Hypokalemia as per admitting team     **Disclaimer: This note was dictated with voice recognition software. Similar sounding words can inadvertently be transcribed and this note may contain transcription errors which may not have been corrected upon publication of note.Sharene Butters E, PA-C 10/02/2014  7:32 AM Gloriana Piltz, MD 10/02/2014

## 2014-10-02 NOTE — Evaluation (Signed)
Physical Therapy Evaluation Patient Details Name: Rebekah Cross MRN: [id number] DOB: 02/19/1925 Today's Date: 10/02/2014   History of Present Illness  Mrs. Stockley is an 80 year old woman with a history of low-grade myelodysplastic syndrome, transfusion dependent since 2014, admitted to the [facility] emergency department with a one-week history of progressive exertional dyspnea, yellow productive cough, generalized weakness with gait instability, subjective fever and chills, decreased appetite, intermittent nausea with one episode of vomiting, and complaining of urinary urgency and frequency as well  Clinical Impression  Pt admitted with dx of sepsis and presenting with functional mobility limitations 2* ambulatory balance deficits and ltd endurance.  RN present and monitoring SaO2 during session with pt desat to 87% on RA.  Pt with very supportive family and plans d/c to home.  Pt would benefit from follow up HHPT     Follow Up Recommendations Home health PT    Equipment Recommendations  Rolling walker with 5" wheels (Pt needs youth height RW)    Recommendations for Other Services       Precautions / Restrictions Precautions Precautions: Fall Restrictions Weight Bearing Restrictions: No      Mobility  Bed Mobility Overal bed mobility: Modified Independent                Transfers Overall transfer level: Needs assistance Equipment used: None Transfers: Sit to/from Stand Sit to Stand: Min guard         General transfer comment: cues for transition position and use of UEs to self assist  Ambulation/Gait Ambulation/Gait assistance: Min assist;Min guard Ambulation Distance (Feet): 450 Feet Assistive device: Rolling walker (2 wheeled);None Gait Pattern/deviations: Step-through pattern;Decreased step length - right;Decreased step length - left;Shuffle;Trunk flexed Gait velocity: decr   General Gait Details: Pt requiring min assist for balance with ambulation sans AD.   With RW, progressed to min guard with cues for posture and position from [employer]            Wheelchair Mobility    Modified Rankin (Stroke Patients Only)       Balance Overall balance assessment: Needs assistance Sitting-balance support: Feet supported Sitting balance-Leahy Scale: Good     Standing balance support: No upper extremity supported Standing balance-Leahy Scale: Fair                               Pertinent Vitals/Pain Pain Assessment: No/denies pain    Home Living Family/patient expects to be discharged to:: Private residence Living Arrangements: Alone Available Help at Discharge: Family;Available 24 hours/day Type of Home: Mobile home Home Access: Stairs to enter Entrance Stairs-Rails: Right Entrance Stairs-Number of Steps: 4 Home Layout: One level Home Equipment: None      Prior Function Level of Independence: Independent               Hand Dominance        Extremity/Trunk Assessment   Upper Extremity Assessment: Overall WFL for tasks assessed           Lower Extremity Assessment: Overall WFL for tasks assessed         Communication   Communication: No difficulties  Cognition Arousal/Alertness: Awake/alert Behavior During Therapy: WFL for tasks assessed/performed Overall Cognitive Status: Within Functional Limits for tasks assessed                      General Comments      Exercises  Assessment/Plan    PT Assessment Patient needs continued PT services  PT Diagnosis Difficulty walking   PT Problem List Decreased strength;Decreased activity tolerance;Decreased balance;Decreased mobility;Decreased knowledge of use of DME  PT Treatment Interventions DME instruction;Gait training;Stair training;Functional mobility training;Therapeutic activities;Therapeutic exercise;Patient/family education   PT Goals (Current goals can be found in the Care Plan section) Acute Rehab PT Goals Patient  Stated Goal: HOME PT Goal Formulation: With patient Time For Goal Achievement: 10/16/14 Potential to Achieve Goals: Good    Frequency Min 3X/week   Barriers to discharge        Co-evaluation               End of Session Equipment Utilized During Treatment: Oxygen Activity Tolerance: Patient tolerated treatment well Patient left: in bed;with call bell/phone within reach;with family/visitor present Nurse Communication: Mobility status         Time: 1202-1232 PT Time Calculation (min) (ACUTE ONLY): 30 min   Charges:   PT Evaluation $Initial PT Evaluation Tier I: 1 Procedure PT Treatments $Gait Training: 23-37 mins   PT G Codes:        Clem Wisenbaker 01-Nov-2014, 1:07 PM

## 2014-10-02 NOTE — Progress Notes (Signed)
PROGRESS NOTE  Rebekah Cross GGY:[id number] DOB: 1925/04/06 DOA: 09/28/2014 PCP: Mayra Neer, MD  Assessment/Plan: sepsis/bacteremia due to Aeromonas -Patient currently afebrile and feeling better -WBC is trending down and no further tachycardia appreciated on exam. -d/c ceftriaxone and azithromycin -started ciprofloxacin 09/30/14-->change to po  Acute Respiratory Failure  -secondary to community acquired pneumonia/bronchitis and CHF -d/c Rocephin and Zithromax -repeat chest x-ray--pulm vascular congestion, basilar opacities -Will start Pulmicort and flutter valve -Continue PRN oxygen supplementation -saline lock IVF -check [id number]  Acute systolic and diastolic CHF -82/99/3716 echocardiogram EF 45-50 percent, grade 2 diastolic dysfunction, PAP 58 -Start furosemide 40 mg IV  -Daily weights   Abdominal pain -LUQ pain acute onset on 12/27--likely due to splenomegaly -CT abdomen/pelvis--new moderate bilateral pleural effusion with minimal pericardial effusion. Progressive hepatosplenomegaly   Myelodysplastic syndrome/Anemia -transfused one unit PRBC per med onc -appreciate Med Onc followup  essential hypertension:  -Blood pressure stable -d/c nisoldipine -Continue metoprolol succinate  GERD:  -Continue PPI  diabetes mellitus:  -Decreased Levemir to 6 units-->no further hypoglycemia -Discontinue premeal novolog -change SSI to sensitive  MDS with 5q deletion -Further treatment and recommendations per oncology service -Dr. Alvy Bimler has been informed of patient admission.  acute kidney injury superimposed on chronic kidney disease stage III -Most likely secondary to mild dehydration and sepsis -Creatinine trended down -follow renal function  hypokalemia:  -Will replete as needed   Code Status: Full code Family Communication: Daughter at bedside updated 12/29         Procedures/Studies: Ct Abdomen Pelvis Wo Contrast  10/02/2014    CLINICAL DATA:  Low abdominal pain with nausea, vomiting and diarrhea for 3 days. History of chronic kidney disease, myelodysplastic syndrome, hypertension and diabetes. Initial encounter.  EXAM: CT ABDOMEN AND PELVIS WITHOUT CONTRAST  TECHNIQUE: Multidetector CT imaging of the abdomen and pelvis was performed following the standard protocol without IV contrast.  COMPARISON:  Abdominal pelvic CT 07/18/2014.  FINDINGS: Lower chest: There are new moderate size dependent pleural effusions bilaterally with associated bibasilar pulmonary opacities, likely atelectasis. The heart is enlarged. There is minimal pericardial fluid. There is mild atherosclerosis of the aorta and coronary arteries. Calcified right hilar lymph nodes are noted. Central venous catheter tip is at the SVC right atrial junction.  Hepatobiliary: There is progressive hepatomegaly. The liver measures up to 21.6 cm transverse and 22.4 cm cephalocaudad, extending into the false pelvis. There is a probable new ill-defined low-density lesion inferiorly in the right hepatic lobe, measuring 2.5 x 2.7 cm on image 50. Evaluation of this area is mildly limited by breathing artifact. No other liver lesions demonstrated. The gallbladder appears contracted without wall thickening or calcified gallstones. There is no biliary dilatation.  Pancreas: Atrophied without apparent focal abnormality on non contrast imaging.  Spleen: Mildly enlarged compared with the prior study, measuring up to 14.6 cm in height. No focal lesions identified aside from calcified granulomas inferiorly.  Adrenals/Urinary Tract: Both adrenal glands appear normal.The kidneys appear normal without evidence of urinary tract calculus or hydronephrosis. No bladder abnormalities are seen.  Stomach/Bowel: No evidence of bowel wall thickening, distention or surrounding inflammatory change.There is stable mild distal colonic diverticulosis. No extraluminal fluid collections demonstrated.   Vascular/Lymphatic: There are no enlarged abdominal or pelvic lymph nodes. Diffuse aortoiliac atherosclerosis and multiple pelvic phleboliths are stable.  Reproductive: Status post hysterectomy. No evidence of adnexal mass. Pelvic floor laxity likely.  Other: There is some thinning of the anterior abdominal wall inferiorly  without focal hernia.  Musculoskeletal: Generalized osteosclerosis appears stable. No focal lytic lesion or pathologic fracture demonstrated. There is multilevel disc and endplate degeneration throughout the thoracolumbar spine, grossly stable.  IMPRESSION: 1. Mild interval progression of hepatosplenomegaly. There is a questionable new lesion inferiorly in the right hepatic lobe versus artifact. 2. New moderate pleural effusions with associated probable bibasilar atelectasis. 3. No other significant changes identified. No evidence of active intra-abdominal inflammatory process. 4. Diffuse atherosclerosis.   Electronically Signed   By: Camie Patience M.D.   On: 10/02/2014 08:25   Dg Chest 2 View  09/30/2014   CLINICAL DATA:  Shortness of breath, fever and weakness for 4 days, hypertension, myelodysplastic syndrome, type II diabetes  EXAM: CHEST  2 VIEW  COMPARISON:  09/28/2014  FINDINGS: RIGHT jugular Port-A-Cath stable tip projecting over mid SVC.  Enlargement of cardiac silhouette with pulmonary vascular congestion.  Calcified mildly tortuous thoracic aorta.  Bibasilar pleural effusions and atelectasis.  Remaining lungs clear.  No pneumothorax.  IMPRESSION: Enlargement of cardiac silhouette with pulmonary vascular congestion.  New bibasilar pleural effusions and atelectasis.   Electronically Signed   By: Lavonia Dana M.D.   On: 09/30/2014 15:16   Dg Chest 2 View  09/28/2014   CLINICAL DATA:  Recent fall, shortness of Breath  EXAM: CHEST  2 VIEW  COMPARISON:  08/27/2013  FINDINGS: A new right-sided chest wall port is noted in satisfactory position. The cardiac shadow is stable. The lungs are  well aerated bilaterally without focal infiltrate. No acute bony abnormality is noted.  IMPRESSION: No active cardiopulmonary disease.   Electronically Signed   By: Inez Catalina M.D.   On: 09/28/2014 08:38         Subjective:  patient is breathing better after intravenous furosemide. Denies any headache, chest pain, nausea, vomiting, diarrhea, abdominal pain. No fevers or chills.   Objective: Filed Vitals:   10/01/14 2025 10/01/14 2028 10/02/14 0503 10/02/14 1324  BP: 171/60 166/59 164/62 124/46  Pulse: 87  96 87  Temp: 98 F (36.7 C)  99.6 F (37.6 C) 98.4 F (36.9 C)  TempSrc: Oral  Oral Oral  Resp: 18  20 20   Height:      Weight:   58.786 kg (129 lb 9.6 oz)   SpO2: 96%  95% 98%    Intake/Output Summary (Last 24 hours) at 10/02/14 1832 Last data filed at 10/02/14 1717  Gross per 24 hour  Intake    680 ml  Output      0 ml  Net    680 ml   Weight change: -0.114 kg (-4 oz) Exam:   General:  Pt is alert, follows commands appropriately, not in acute distress  HEENT: No icterus, No thrush,  [place]/AT  Cardiovascular: RRR, S1/S2, no rubs, no gallops  Respiratory: bibasilar crackles. No wheezing. Good air movement   Abdomen: Soft/+BS, non tender, non distended, no guarding  Extremities: trace LE edema, No lymphangitis, No petechiae, No rashes, no synovitis  Data Reviewed: Basic Metabolic Panel:  Recent Labs Lab 09/28/14 0816 09/28/14 1230 09/29/14 0502 10/01/14 0414 10/02/14 0500  NA 137 137 134* 138 137  K 2.7* 3.1* 3.5 4.2 4.7  CL 99 99 101 109 108  CO2 26 29 26 26 23   GLUCOSE 122* 146* 106* 97 143*  BUN 47* 42* 35* 20 20  CREATININE 1.51* 1.30* 1.01 0.68 0.76  CALCIUM 8.3* 8.1* 7.6* 7.8* 8.3*  MG  --  1.1* 2.0  --   --  PHOS  --  3.5  --   --   --    Liver Function Tests:  Recent Labs Lab 09/28/14 0816 09/28/14 1230 09/29/14 0502 10/02/14 0500  AST 45* 37 39* 22  ALT 76* 70* 73* 54*  ALKPHOS 70 67 85 125*  BILITOT 2.2* 1.7* 2.0* 1.4*  PROT  6.3 5.9* 5.5* 6.0  ALBUMIN 3.4* 3.0* 2.6* 2.9*   No results for input(s): LIPASE, AMYLASE in the last 168 hours. No results for input(s): AMMONIA in the last 168 hours. CBC:  Recent Labs Lab 09/26/14 1049 09/28/14 0816 09/28/14 1230 09/29/14 0502 10/01/14 0414 10/02/14 0400  WBC 16.0* 12.4* 9.2 7.9 6.6 9.1  NEUTROABS 13.7* 11.2* 8.1*  --   --  7.0  HGB 7.3* 9.1* 8.3* 7.9* 7.9* 9.0*  HCT 21.9* 26.5* 24.4* 23.6* 23.9* 27.0*  MCV 81.7 80.1 80.3 81.4 81.3 81.3  PLT 122* 71* 64* 54* 77* 100*   Cardiac Enzymes: No results for input(s): CKTOTAL, CKMB, CKMBINDEX, TROPONINI in the last 168 hours. BNP: Invalid input(s): POCBNP CBG:  Recent Labs Lab 10/01/14 1656 10/01/14 2134 10/02/14 0657 10/02/14 1131 10/02/14 1658  GLUCAP 149* 100* 125* 202* 82    Recent Results (from the past 240 hour(s))  TECHNOLOGIST REVIEW     Status: None   Collection Time: 09/24/14  9:25 AM  Result Value Ref Range Status   Technologist Review Metas and Myelocytes present, 1% Blast, Giant PLTs  Final  Culture, blood (routine x 2)     Status: None   Collection Time: 09/28/14  8:16 AM  Result Value Ref Range Status   Specimen Description BLOOD RIGHT CHEST  Final   Special Requests BOTTLES DRAWN AEROBIC AND ANAEROBIC 4ML  Final   Culture   Final    AEROMONAS HYDROPHILA GROUP Note: SUSCEPTIBILITIES PERFORMED ON PREVIOUS CULTURE WITHIN THE LAST 5 DAYS. Note: Gram Stain Report Called to,Read Back By and Verified With: JESSICA ASARO ON 09/28/2014 AT 10:23P BY WILEJ Performed at [facility]    Report Status 09/30/2014 FINAL  Final  Culture, blood (routine x 2)     Status: None   Collection Time: 09/28/14  8:38 AM  Result Value Ref Range Status   Specimen Description BLOOD LEFT ANTECUBITAL  Final   Special Requests BOTTLES DRAWN AEROBIC AND ANAEROBIC 5CC  Final   Culture   Final    AEROMONAS HYDROPHILA GROUP Note: Gram Stain Report Called to,Read Back By and Verified With: JESSICA ASARO ON  09/29/2014 AT 12:06A BY WILEJ Performed at [facility]    Report Status 09/30/2014 FINAL  Final   Organism ID, Bacteria AEROMONAS HYDROPHILA GROUP  Final      Susceptibility   Aeromonas hydrophila group - MIC*    CEFTAZIDIME <=1 SENSITIVE Sensitive     CIPROFLOXACIN <=0.25 SENSITIVE Sensitive     GENTAMICIN <=1 SENSITIVE Sensitive     IMIPENEM 8 INTERMEDIATE Intermediate     PIP/TAZO 8 SENSITIVE Sensitive     TOBRAMYCIN <=1 SENSITIVE Sensitive     * AEROMONAS HYDROPHILA GROUP  Urine culture     Status: None   Collection Time: 09/28/14  9:28 AM  Result Value Ref Range Status   Specimen Description URINE, RANDOM  Final   Special Requests NONE  Final   Colony Count   Final    >=100,000 COLONIES/ML Performed at [facility]    Culture   Final    ESCHERICHIA COLI Performed at [facility]    Report  Status 09/30/2014 FINAL  Final   Organism ID, Bacteria ESCHERICHIA COLI  Final      Susceptibility   Escherichia coli - MIC*    AMPICILLIN 8 SENSITIVE Sensitive     CEFAZOLIN <=4 SENSITIVE Sensitive     CEFTRIAXONE <=1 SENSITIVE Sensitive     CIPROFLOXACIN <=0.25 SENSITIVE Sensitive     GENTAMICIN <=1 SENSITIVE Sensitive     LEVOFLOXACIN <=0.12 SENSITIVE Sensitive     NITROFURANTOIN <=16 SENSITIVE Sensitive     TOBRAMYCIN <=1 SENSITIVE Sensitive     TRIMETH/SULFA <=20 SENSITIVE Sensitive     PIP/TAZO <=4 SENSITIVE Sensitive     * ESCHERICHIA COLI     Scheduled Meds: . sodium chloride   Intravenous Once  . budesonide (PULMICORT) nebulizer solution  0.25 mg Nebulization BID  . ciprofloxacin  400 mg Intravenous Q12H  . feeding supplement (RESOURCE BREEZE)  1 Container Oral BID BM  . insulin aspart  0-5 Units Subcutaneous QHS  . insulin aspart  0-9 Units Subcutaneous TID WC  . insulin detemir  6 Units Subcutaneous Daily  . LORazepam  0.5 mg Oral QHS  . metoprolol succinate  50 mg Oral q morning - 10a  . mometasone  1 application Topical Daily  .  multivitamin with minerals  1 tablet Oral Daily  . nisoldipine  17 mg Oral Daily  . pantoprazole (PROTONIX) IV  40 mg Intravenous Q24H  . potassium chloride SA  20 mEq Oral BID  . prochlorperazine  10 mg Oral Daily  . sodium chloride  3 mL Intravenous Q12H  . traMADol  50 mg Oral BID   Continuous Infusions:    Maryse Brierley, DO  [facility] Pager [phone]  If 7PM-7AM, please contact night-coverage [email or web address] Password [facility] 10/02/2014, 6:32 PM   LOS: 4 days

## 2014-10-03 DIAGNOSIS — R112 Nausea with vomiting, unspecified: Secondary | ICD-10-CM

## 2014-10-03 LAB — GLUCOSE, CAPILLARY
GLUCOSE-CAPILLARY: 149 mg/dL — AB (ref 70–99)
GLUCOSE-CAPILLARY: 176 mg/dL — AB (ref 70–99)
Glucose-Capillary: 104 mg/dL — ABNORMAL HIGH (ref 70–99)
Glucose-Capillary: 102 mg/dL — ABNORMAL HIGH (ref 70–99)
Glucose-Capillary: 109 mg/dL — ABNORMAL HIGH (ref 70–99)

## 2014-10-03 LAB — LEGIONELLA ANTIGEN, URINE

## 2014-10-03 LAB — BASIC METABOLIC PANEL
ANION GAP: 9 (ref 5–15)
BUN: 20 mg/dL (ref 6–23)
CALCIUM: 8.4 mg/dL (ref 8.4–10.5)
CO2: 25 mmol/L (ref 19–32)
Chloride: 103 mEq/L (ref 96–112)
Creatinine, Ser: 0.73 mg/dL (ref 0.50–1.10)
GFR calc Af Amer: 87 mL/min — ABNORMAL LOW (ref >90)
GFR, EST NON AFRICAN AMERICAN: 75 mL/min — AB (ref >90)
Glucose, Bld: 102 mg/dL — ABNORMAL HIGH (ref 70–99)
Potassium: 3.9 mmol/L (ref 3.5–5.1)
SODIUM: 137 mmol/L (ref 135–145)

## 2014-10-03 MED ORDER — FUROSEMIDE 10 MG/ML IJ SOLN
40.0000 mg | Freq: Every day | INTRAMUSCULAR | Status: DC
  Administered 2014-10-03: 40 mg via INTRAVENOUS
  Filled 2014-10-03: qty 4

## 2014-10-03 MED ORDER — PANTOPRAZOLE SODIUM 40 MG PO TBEC
40.0000 mg | DELAYED_RELEASE_TABLET | Freq: Every day | ORAL | Status: DC
  Administered 2014-10-03 – 2014-10-05 (×3): 40 mg via ORAL
  Filled 2014-10-03 (×3): qty 1

## 2014-10-03 MED ORDER — MORPHINE SULFATE 2 MG/ML IJ SOLN
0.5000 mg | INTRAMUSCULAR | Status: DC | PRN
  Administered 2014-10-03: 0.5 mg via INTRAVENOUS
  Filled 2014-10-03: qty 1

## 2014-10-03 MED ORDER — HYDRALAZINE HCL 20 MG/ML IJ SOLN
5.0000 mg | Freq: Four times a day (QID) | INTRAMUSCULAR | Status: DC | PRN
  Administered 2014-10-03 – 2014-10-04 (×2): 5 mg via INTRAVENOUS
  Filled 2014-10-03 (×2): qty 1

## 2014-10-03 MED ORDER — CIPROFLOXACIN HCL 500 MG PO TABS
500.0000 mg | ORAL_TABLET | Freq: Two times a day (BID) | ORAL | Status: DC
  Administered 2014-10-03 – 2014-10-05 (×4): 500 mg via ORAL
  Filled 2014-10-03 (×4): qty 1

## 2014-10-03 NOTE — Progress Notes (Signed)
Rebekah Cross   DOB:June 20, 1925   WU#:[id number]   GNF#:[id number]  Patient Care Team: Mayra Neer, MD as PCP - General (Family Medicine) Heath Lark, MD as Consulting Physician (Hematology and Oncology) I have seen the patient, examined her and edited the notes as follows  Subjective: Feeling well this morning.Afebrile. Denies any shortness of breath, cough or chest pain. Denies any nausea, vomiting or diarrhea. Left upper quadrant pain less painful. She denies any bleeding issues such as epistaxis, hematemesis, hematochezia or hematuria. Ambulates with assistance.   Scheduled Meds: . sodium chloride   Intravenous Once  . budesonide (PULMICORT) nebulizer solution  0.25 mg Nebulization BID  . ciprofloxacin  400 mg Intravenous Q12H  . feeding supplement (RESOURCE BREEZE)  1 Container Oral BID BM  . furosemide  40 mg Intravenous Daily  . insulin aspart  0-5 Units Subcutaneous QHS  . insulin aspart  0-9 Units Subcutaneous TID WC  . insulin detemir  6 Units Subcutaneous Daily  . LORazepam  0.5 mg Oral QHS  . metoprolol succinate  50 mg Oral q morning - 10a  . mometasone  1 application Topical Daily  . multivitamin with minerals  1 tablet Oral Daily  . pantoprazole (PROTONIX) IV  40 mg Intravenous Q24H  . potassium chloride SA  20 mEq Oral BID  . prochlorperazine  10 mg Oral Daily  . sodium chloride  3 mL Intravenous Q12H  . traMADol  50 mg Oral BID   Continuous Infusions:  PRN Meds:acetaminophen, acetaminophen, diphenhydrAMINE, hydrOXYzine, ipratropium-albuterol, ondansetron **OR** ondansetron (ZOFRAN) IV, sodium chloride   Objective:  Filed Vitals:   10/03/14 0500  BP: 145/60  Pulse: 62  Temp: 98 F (36.7 C)  Resp: 20      Intake/Output Summary (Last 24 hours) at 10/03/14 0758 Last data filed at 10/02/14 2200  Gross per 24 hour  Intake    280 ml  Output      0 ml  Net    280 ml    GENERAL:alert, no distress and comfortable SKIN: skin color, texture, turgor are normal, no  rashes EYES: normal, conjunctiva are pink and non-injected, sclera clear OROPHARYNX:no exudate, no erythema and lips, buccal mucosa, and tongue normal  NECK: supple, thyroid normal size, non-tender, without nodularity LYMPH:  no palpable lymphadenopathy in the cervical, axillary or inguinal LUNGS: clear to auscultation and percussion with normal breathing effort HEART: regular rate & rhythm and no murmurs and no lower extremity edema ABDOMEN:abdomen soft, tender at the left flank, and normal bowel sounds Musculoskeletal:no cyanosis of digits and no clubbing  PSYCH: alert & oriented x 3 with fluent speech NEURO: no focal motor/sensory deficits    Labs:   Recent Labs Lab 09/26/14 1049 09/28/14 0816 09/28/14 1230 09/29/14 0502 10/01/14 0414 10/02/14 0400  WBC 16.0* 12.4* 9.2 7.9 6.6 9.1  HGB 7.3* 9.1* 8.3* 7.9* 7.9* 9.0*  HCT 21.9* 26.5* 24.4* 23.6* 23.9* 27.0*  PLT 122* 71* 64* 54* 77* 100*  MCV 81.7 80.1 80.3 81.4 81.3 81.3  MCH 27.2 27.5 27.3 27.2 26.9 27.1  MCHC 33.3 34.3 34.0 33.5 33.1 33.3  RDW 13.7 14.9 15.0 15.1 15.0 15.3  LYMPHSABS 0.6* 0.4* 0.4*  --   --  0.7  MONOABS 1.0* 0.4 0.3  --   --  0.4  EOSABS 0.6* 0.4 0.3  --   --  0.9*  BASOSABS 0.1 0.0 0.1  --   --  0.1     Chemistries:    Recent Labs Lab 09/28/14 [phone]  09/28/14 1230 09/29/14 0502 10/01/14 0414 10/02/14 0500 10/03/14 0420  NA 137 137 134* 138 137 137  K 2.7* 3.1* 3.5 4.2 4.7 3.9  CL 99 99 101 109 108 103  CO2 26 29 26 26 23 25   GLUCOSE 122* 146* 106* 97 143* 102*  BUN 47* 42* 35* 20 20 20   CREATININE 1.51* 1.30* 1.01 0.68 0.76 0.73  CALCIUM 8.3* 8.1* 7.6* 7.8* 8.3* 8.4  MG  --  1.1* 2.0  --   --   --   AST 45* 37 39*  --  22  --   ALT 76* 70* 73*  --  54*  --   ALKPHOS 70 67 85  --  125*  --   BILITOT 2.2* 1.7* 2.0*  --  1.4*  --     GFR Estimated Creatinine Clearance: 38.6 mL/min (by C-G formula based on Cr of 0.73).  Liver Function Tests:  Recent Labs Lab 09/28/14 0816  09/28/14 1230 09/29/14 0502 10/02/14 0500  AST 45* 37 39* 22  ALT 76* 70* 73* 54*  ALKPHOS 70 67 85 125*  BILITOT 2.2* 1.7* 2.0* 1.4*  PROT 6.3 5.9* 5.5* 6.0  ALBUMIN 3.4* 3.0* 2.6* 2.9*    Coagulation profile  Recent Labs Lab 09/28/14 1230  INR 1.46   CBG:  Recent Labs Lab 10/01/14 2134 10/02/14 0657 10/02/14 1131 10/02/14 1658 10/02/14 2035  GLUCAP 100* 125* 202* 82 104*      Imaging Studies: CT scan is reviewed Ct Abdomen Pelvis Wo Contrast  10/02/2014   CLINICAL DATA:  Low abdominal pain with nausea, vomiting and diarrhea for 3 days. History of chronic kidney disease, myelodysplastic syndrome, hypertension and diabetes. Initial encounter.  EXAM: CT ABDOMEN AND PELVIS WITHOUT CONTRAST  TECHNIQUE: Multidetector CT imaging of the abdomen and pelvis was performed following the standard protocol without IV contrast.  COMPARISON:  Abdominal pelvic CT 07/18/2014.  FINDINGS: Lower chest: There are new moderate size dependent pleural effusions bilaterally with associated bibasilar pulmonary opacities, likely atelectasis. The heart is enlarged. There is minimal pericardial fluid. There is mild atherosclerosis of the aorta and coronary arteries. Calcified right hilar lymph nodes are noted. Central venous catheter tip is at the SVC right atrial junction.  Hepatobiliary: There is progressive hepatomegaly. The liver measures up to 21.6 cm transverse and 22.4 cm cephalocaudad, extending into the false pelvis. There is a probable new ill-defined low-density lesion inferiorly in the right hepatic lobe, measuring 2.5 x 2.7 cm on image 50. Evaluation of this area is mildly limited by breathing artifact. No other liver lesions demonstrated. The gallbladder appears contracted without wall thickening or calcified gallstones. There is no biliary dilatation.  Pancreas: Atrophied without apparent focal abnormality on non contrast imaging.  Spleen: Mildly enlarged compared with the prior study,  measuring up to 14.6 cm in height. No focal lesions identified aside from calcified granulomas inferiorly.  Adrenals/Urinary Tract: Both adrenal glands appear normal.The kidneys appear normal without evidence of urinary tract calculus or hydronephrosis. No bladder abnormalities are seen.  Stomach/Bowel: No evidence of bowel wall thickening, distention or surrounding inflammatory change.There is stable mild distal colonic diverticulosis. No extraluminal fluid collections demonstrated.  Vascular/Lymphatic: There are no enlarged abdominal or pelvic lymph nodes. Diffuse aortoiliac atherosclerosis and multiple pelvic phleboliths are stable.  Reproductive: Status post hysterectomy. No evidence of adnexal mass. Pelvic floor laxity likely.  Other: There is some thinning of the anterior abdominal wall inferiorly without focal hernia.  Musculoskeletal: Generalized osteosclerosis appears stable. No focal  lytic lesion or pathologic fracture demonstrated. There is multilevel disc and endplate degeneration throughout the thoracolumbar spine, grossly stable.  IMPRESSION: 1. Mild interval progression of hepatosplenomegaly. There is a questionable new lesion inferiorly in the right hepatic lobe versus artifact. 2. New moderate pleural effusions with associated probable bibasilar atelectasis. 3. No other significant changes identified. No evidence of active intra-abdominal inflammatory process. 4. Diffuse atherosclerosis.   Electronically Signed   By: Camie Patience M.D.   On: 10/02/2014 08:25    Assessment/Plan: 80 y.o.  MDS (myelodysplastic syndrome) with 5q deletion Overall, she tolerated treatment very poorly. Continue to hold chemotherapy and continue supportive care only.  She will also continue on prednisone daily  Anemia in neoplastic disease This is likely due to recent treatment.  The patient denies recent history of bleeding such as epistaxis, hematuria or hematochezia.  She was symptomatic from the anemia on  admission requiring transfusion. She received 2 units of blood to date with good response. No labs are available today The plan would be to give her blood whenever her hemoglobin dropped to less than 8 g  Thrombocytopenia Likely secondary to neoplastic disease, dilution, and acute infection, antibiotics This has improved since admission Continue to monitor, as no bleeding issues are present  Psoriasis This has flared up since recent prednisone taper. Continue low dose prednisone  Suspected Sepsis-bacteremia due to Aeromonas E Coli UTI She was initially treated with IV ceftriaxone and azithromycin, which them were switched to Cipro, along with hydration, with improvement of symptoms Changes in antibiotics as per admitting team, currently on oral Cipro, day 4  Left Upper Quadrant Pain In the setting of UTI, acute kidney injury on chronic kidney disease, and liver and spleen enlargement CT of the Abdomen and Pelvis on 12/28 was remarkable for mild interval progression of hepatosplenomegaly related to extramedullary erythropoeisis There is a questionable new lesion inferiorly in the right hepatic lobe which could be artifact.  Pain is improved from prior day  Moderate nausea and vomiting suspect they might be an element of gastritis. She will continue proton pump inhibitor.  Low grade fever Likely due to infection Resolved with antibiotics and supportive care.  Acute respiratory failure Patient was placed on the antibiotics, Pulmicort and albuterol, oxygen and IV fluids, with good response Her symptoms are improved. Appreciate primary team Involvement  Acute systolic and diastolic congestive heart failure 2 D echo on 12/29 showed grade 2  Diastolic CHF She was initiated on Lasix IV Appreciate primary team follow up  DVT prophylaxis On mechanical devices  Full code  Disposition:  To home today or tomorrow if clinically stable. Patient has an appointment with Dr. Alvy Bimler on  1/4 at 10 a.m (labs at 9:30 am)   Other medical issues such as diabetes mellitus, Chronic kidney disease and CHF as per admitting team    **Disclaimer: This note was dictated with voice recognition software. Similar sounding words can inadvertently be transcribed and this note may contain transcription errors which may not have been corrected upon publication of note.Sharene Butters E, PA-C 10/03/2014  7:58 AM Rondel Jumbo, PA-C 10/03/2014 Kane Kusek, MD 10/03/2014

## 2014-10-03 NOTE — Progress Notes (Signed)
Physical Therapy Treatment Patient Details Name: Rebekah Cross MRN: [id number] DOB: Jun 10, 1925 Today's Date: 10/03/2014    History of Present Illness Rebekah Cross is an 80 year old woman with a history of low-grade myelodysplastic syndrome, transfusion dependent since 2014, admitted to the [facility] emergency department with a one-week history of progressive exertional dyspnea, yellow productive cough, generalized weakness with gait instability, subjective fever and chills, decreased appetite, intermittent nausea with one episode of vomiting, and complaining of urinary urgency and frequency as well    PT Comments    Pt did well with gait on room air with o2 95% after gait.  Pt likes the RW and will need youth RW for home use.  Follow Up Recommendations  Home health PT     Equipment Recommendations  Rolling walker with 5" wheels;Other (comment) (YOUTH SIZE)    Recommendations for Other Services       Precautions / Restrictions Precautions Precautions: Fall Restrictions Weight Bearing Restrictions: No    Mobility  Bed Mobility Overal bed mobility: Modified Independent                Transfers Overall transfer level: Needs assistance Equipment used: None Transfers: Sit to/from Stand Sit to Stand: Supervision         General transfer comment: slightly impulsive with transfers.  Letting go of RW too soon with stand to sit.  Ambulation/Gait Ambulation/Gait assistance: Min guard Ambulation Distance (Feet): 300 Feet Assistive device: Rolling walker (2 wheeled) Gait Pattern/deviations: Decreased step length - right;Decreased step length - left;Trunk flexed     General Gait Details: Pt likes RW and hoping to get youth one for home.  1 small stagger to the left, but no LOB. Cues for safety with RW during transitional movements.   Stairs            Wheelchair Mobility    Modified Rankin (Stroke Patients Only)       Balance     Sitting balance-Leahy  Scale: Good     Standing balance support: Bilateral upper extremity supported Standing balance-Leahy Scale: Fair                      Cognition Arousal/Alertness: Awake/alert Behavior During Therapy: WFL for tasks assessed/performed                        Exercises      General Comments        Pertinent Vitals/Pain Pain Assessment: No/denies pain    Home Living                      Prior Function            PT Goals (current goals can now be found in the care plan section) Acute Rehab PT Goals Patient Stated Goal: HOME Time For Goal Achievement: 10/16/14 Potential to Achieve Goals: Good Progress towards PT goals: Progressing toward goals    Frequency  Min 3X/week    PT Plan Current plan remains appropriate    Co-evaluation             End of Session Equipment Utilized During Treatment: Gait belt Activity Tolerance: Patient tolerated treatment well Patient left: in chair;with call bell/phone within reach;with family/visitor present     Time: 0239-0257 PT Time Calculation (min) (ACUTE ONLY): 18 min  Charges:  $Gait Training: 8-22 mins  G Codes:      Rebekah Cross 10/03/2014, 3:07 PM

## 2014-10-03 NOTE — Progress Notes (Signed)
Pharmacy - Physician Communication  The patient is receiving Protonix by the intravenous route.  Based on criteria approved by the Pharmacy and [place], the medication is being converted to the equivalent oral dose form.  These criteria include: -No Active GI bleeding -Able to tolerate diet of full liquids (or better) or tube feeding -Able to tolerate other medications by the oral or enteral route  If you have any questions about this conversion, please contact the Pharmacy Department (phone [phone]).  Thank you.  Doreene Eland, PharmD, BCPS.   Pager: [phone] 10/03/2014 12:07 PM

## 2014-10-03 NOTE — Care Management Note (Signed)
    Page 1 of 2   10/03/2014     4:16:38 PM CARE MANAGEMENT NOTE 10/03/2014  Patient:  Rebekah Cross, Rebekah Cross   Account Number:  [id number]  Date Initiated:  10/03/2014  Documentation initiated by:  Sunday Spillers  Subjective/Objective Assessment:   80 yo female admitted with sepsis, CHF, respiratory failure. PTA lived at home alone.     Action/Plan:   Home when stable   Anticipated DC Date:  10/06/2014   Anticipated DC Plan:  [place]  CM consult      [facility] Choice  HOME HEALTH   Choice offered to / List presented to:  C-1 Patient        [place] arranged  HH-1 RN  [place] PT      [place] agency  [place]   Status of service:  Completed, signed off Medicare Important Message given?   (If response is "NO", the following Medicare IM given date fields will be blank) Date Medicare IM given:   Medicare IM given by:   Date Additional Medicare IM given:   Additional Medicare IM given by:    Discharge Disposition:  [place]  Per UR Regulation:  Reviewed for med. necessity/level of care/duration of stay  If discussed at [place] of Stay Meetings, dates discussed:    Comments:  10-03-14 [place] with patient and son at bedside. Discussed recommendations for [facility] PT, also possible RN. Initially patient did not want HH, son encouraged her to accept. She agreed, provided [facility] list for choice. [place], contacted them and they could only start on Sunday, reviewed with patient and attending and they were fine with this. Orders faxed to [facility] at [phone]. Will continue to follow for d/c needs.

## 2014-10-03 NOTE — Progress Notes (Signed)
PROGRESS NOTE  Rebekah Cross DGU:[id number] DOB: 30-Aug-1925 DOA: 09/28/2014 PCP: Mayra Neer, MD  Assessment/Plan: sepsis/bacteremia due to Aeromonas -Patient currently afebrile and feeling better -WBC is trending down and no further tachycardia appreciated on exam. -d/c ceftriaxone and azithromycin -started ciprofloxacin 09/30/14-->change to po  Acute Respiratory Failure  -secondary to community acquired pneumonia/bronchitis and CHF -d/c Rocephin and Zithromax -repeat chest x-ray--pulm vascular congestion, basilar opacities -continue flutter valve -Continue PRN oxygen supplementation -saline lock IVF -check proBNP--888 -continue IV Lasix Acute systolic and diastolic CHF -95/63/8756 echocardiogram EF 45-50 percent, grade 2 diastolic dysfunction, PAP 58 -Start furosemide 40 mg IV daily -Daily weights--neg 5.8kg  Abdominal pain -LUQ pain acute onset on 12/27--likely due to splenomegaly -CT abdomen/pelvis--new moderate bilateral pleural effusion with minimal pericardial effusion. Progressive hepatosplenomegaly   Myelodysplastic syndrome/Anemia -transfused one unit PRBC per med onc -appreciate Med Onc followup  essential hypertension:  -Blood pressure stable -d/c nisoldipine -Continue metoprolol succinate -restart low dose ARB  GERD:  -Continue PPI  diabetes mellitus:  -Decreased Levemir to 6 units-->no further hypoglycemia -Discontinue premeal novolog -change SSI to sensitive  MDS with 5q deletion -Further treatment and recommendations per oncology service -Dr. Alvy Bimler has been informed of patient admission.  acute kidney injury superimposed on chronic kidney disease stage III -Most likely secondary to mild dehydration and sepsis -Creatinine trended down -follow renal function  hypokalemia:  -Will replete as needed   Code Status: Full code Family Communication: Daughter at bedside updated 12/30          Procedures/Studies: Ct  Abdomen Pelvis Wo Contrast  10/02/2014   CLINICAL DATA:  Low abdominal pain with nausea, vomiting and diarrhea for 3 days. History of chronic kidney disease, myelodysplastic syndrome, hypertension and diabetes. Initial encounter.  EXAM: CT ABDOMEN AND PELVIS WITHOUT CONTRAST  TECHNIQUE: Multidetector CT imaging of the abdomen and pelvis was performed following the standard protocol without IV contrast.  COMPARISON:  Abdominal pelvic CT 07/18/2014.  FINDINGS: Lower chest: There are new moderate size dependent pleural effusions bilaterally with associated bibasilar pulmonary opacities, likely atelectasis. The heart is enlarged. There is minimal pericardial fluid. There is mild atherosclerosis of the aorta and coronary arteries. Calcified right hilar lymph nodes are noted. Central venous catheter tip is at the SVC right atrial junction.  Hepatobiliary: There is progressive hepatomegaly. The liver measures up to 21.6 cm transverse and 22.4 cm cephalocaudad, extending into the false pelvis. There is a probable new ill-defined low-density lesion inferiorly in the right hepatic lobe, measuring 2.5 x 2.7 cm on image 50. Evaluation of this area is mildly limited by breathing artifact. No other liver lesions demonstrated. The gallbladder appears contracted without wall thickening or calcified gallstones. There is no biliary dilatation.  Pancreas: Atrophied without apparent focal abnormality on non contrast imaging.  Spleen: Mildly enlarged compared with the prior study, measuring up to 14.6 cm in height. No focal lesions identified aside from calcified granulomas inferiorly.  Adrenals/Urinary Tract: Both adrenal glands appear normal.The kidneys appear normal without evidence of urinary tract calculus or hydronephrosis. No bladder abnormalities are seen.  Stomach/Bowel: No evidence of bowel wall thickening, distention or surrounding inflammatory change.There is stable mild distal colonic diverticulosis. No extraluminal fluid  collections demonstrated.  Vascular/Lymphatic: There are no enlarged abdominal or pelvic lymph nodes. Diffuse aortoiliac atherosclerosis and multiple pelvic phleboliths are stable.  Reproductive: Status post hysterectomy. No evidence of adnexal mass. Pelvic floor laxity likely.  Other: There is some thinning of the  anterior abdominal wall inferiorly without focal hernia.  Musculoskeletal: Generalized osteosclerosis appears stable. No focal lytic lesion or pathologic fracture demonstrated. There is multilevel disc and endplate degeneration throughout the thoracolumbar spine, grossly stable.  IMPRESSION: 1. Mild interval progression of hepatosplenomegaly. There is a questionable new lesion inferiorly in the right hepatic lobe versus artifact. 2. New moderate pleural effusions with associated probable bibasilar atelectasis. 3. No other significant changes identified. No evidence of active intra-abdominal inflammatory process. 4. Diffuse atherosclerosis.   Electronically Signed   By: Camie Patience M.D.   On: 10/02/2014 08:25   Dg Chest 2 View  09/30/2014   CLINICAL DATA:  Shortness of breath, fever and weakness for 4 days, hypertension, myelodysplastic syndrome, type II diabetes  EXAM: CHEST  2 VIEW  COMPARISON:  09/28/2014  FINDINGS: RIGHT jugular Port-A-Cath stable tip projecting over mid SVC.  Enlargement of cardiac silhouette with pulmonary vascular congestion.  Calcified mildly tortuous thoracic aorta.  Bibasilar pleural effusions and atelectasis.  Remaining lungs clear.  No pneumothorax.  IMPRESSION: Enlargement of cardiac silhouette with pulmonary vascular congestion.  New bibasilar pleural effusions and atelectasis.   Electronically Signed   By: Lavonia Dana M.D.   On: 09/30/2014 15:16   Dg Chest 2 View  09/28/2014   CLINICAL DATA:  Recent fall, shortness of Breath  EXAM: CHEST  2 VIEW  COMPARISON:  08/27/2013  FINDINGS: A new right-sided chest wall port is noted in satisfactory position. The cardiac  shadow is stable. The lungs are well aerated bilaterally without focal infiltrate. No acute bony abnormality is noted.  IMPRESSION: No active cardiopulmonary disease.   Electronically Signed   By: Inez Catalina M.D.   On: 09/28/2014 08:38         Subjective: Patient denies fevers, chills, headache, chest pain, dyspnea, nausea, vomiting, diarrhea, abdominal pain, dysuria, hematuria   Objective: Filed Vitals:   10/02/14 2032 10/03/14 0500 10/03/14 0915 10/03/14 1337  BP: 160/54 145/60  159/53  Pulse: 85 62  79  Temp: 98 F (36.7 C) 98 F (36.7 C)  98 F (36.7 C)  TempSrc: Oral Oral  Oral  Resp: _0 Height:      Weight:  55.339 kg (122 lb)    SpO2: 93% 98% 91% 94%    Intake/Output Summary (Last 24 hours) at 10/03/14 1545 Last data filed at 10/03/14 1300  Gross per 24 hour  Intake    440 ml  Output      0 ml  Net    440 ml   Weight change: -3.447 kg (-7 lb 9.6 oz) Exam:   General:  Pt is alert, follows commands appropriately, not in acute distress  HEENT: No icterus, No thrush, [place]/AT  Cardiovascular: RRR, S1/S2, no rubs, no gallops  Respiratory: Bibasilar crackles, left greater than right. No wheezing.  Abdomen: Soft/+BS, non tender, non distended, no guarding  Extremities: 1+LE edema, No lymphangitis, No petechiae, No rashes, no synovitis  Data Reviewed: Basic Metabolic Panel:  Recent Labs Lab 09/28/14 1230 09/29/14 0502 10/01/14 0414 10/02/14 0500 10/03/14 0420  NA 137 134* 138 137 137  K 3.1* 3.5 4.2 4.7 3.9  CL 99 101 109 108 103  CO2 _1 GLUCOSE 146* 106* 97 143* 102*  BUN 42* 35* _2 CREATININE 1.30* 1.01 0.68 0.76 0.73  CALCIUM 8.1* 7.6* 7.8* 8.3* 8.4  MG 1.1* 2.0  --   --   --   PHOS 3.5  --   --   --   --  Liver Function Tests:  Recent Labs Lab 09/28/14 0816 09/28/14 1230 09/29/14 0502 10/02/14 0500  AST 45* 37 39* 22  ALT 76* 70* 73* 54*  ALKPHOS 70 67 85 125*  BILITOT 2.2* 1.7* 2.0* 1.4*  PROT 6.3  5.9* 5.5* 6.0  ALBUMIN 3.4* 3.0* 2.6* 2.9*   No results for input(s): LIPASE, AMYLASE in the last 168 hours. No results for input(s): AMMONIA in the last 168 hours. CBC:  Recent Labs Lab 09/28/14 0816 09/28/14 1230 09/29/14 0502 10/01/14 0414 10/02/14 0400  WBC 12.4* 9.2 7.9 6.6 9.1  NEUTROABS 11.2* 8.1*  --   --  7.0  HGB 9.1* 8.3* 7.9* 7.9* 9.0*  HCT 26.5* 24.4* 23.6* 23.9* 27.0*  MCV 80.1 80.3 81.4 81.3 81.3  PLT 71* 64* 54* 77* 100*   Cardiac Enzymes: No results for input(s): CKTOTAL, CKMB, CKMBINDEX, TROPONINI in the last 168 hours. BNP: Invalid input(s): POCBNP CBG:  Recent Labs Lab 10/02/14 1131 10/02/14 1658 10/02/14 2035 10/03/14 0731 10/03/14 1154  GLUCAP 202* 82 104* 102* 109*    Recent Results (from the past 240 hour(s))  TECHNOLOGIST REVIEW     Status: None   Collection Time: 09/24/14  9:25 AM  Result Value Ref Range Status   Technologist Review Metas and Myelocytes present, 1% Blast, Giant PLTs  Final  Culture, blood (routine x 2)     Status: None   Collection Time: 09/28/14  8:16 AM  Result Value Ref Range Status   Specimen Description BLOOD RIGHT CHEST  Final   Special Requests BOTTLES DRAWN AEROBIC AND ANAEROBIC 4ML  Final   Culture   Final    AEROMONAS HYDROPHILA GROUP Note: SUSCEPTIBILITIES PERFORMED ON PREVIOUS CULTURE WITHIN THE LAST 5 DAYS. Note: Gram Stain Report Called to,Read Back By and Verified With: JESSICA ASARO ON 09/28/2014 AT 10:23P BY WILEJ Performed at [facility]    Report Status 09/30/2014 FINAL  Final  Culture, blood (routine x 2)     Status: None   Collection Time: 09/28/14  8:38 AM  Result Value Ref Range Status   Specimen Description BLOOD LEFT ANTECUBITAL  Final   Special Requests BOTTLES DRAWN AEROBIC AND ANAEROBIC 5CC  Final   Culture   Final    AEROMONAS HYDROPHILA GROUP Note: Gram Stain Report Called to,Read Back By and Verified With: JESSICA ASARO ON 09/29/2014 AT 12:06A BY WILEJ Performed at [facility]    Report Status 09/30/2014 FINAL  Final   Organism ID, Bacteria AEROMONAS HYDROPHILA GROUP  Final      Susceptibility   Aeromonas hydrophila group - MIC*    CEFTAZIDIME <=1 SENSITIVE Sensitive     CIPROFLOXACIN <=0.25 SENSITIVE Sensitive     GENTAMICIN <=1 SENSITIVE Sensitive     IMIPENEM 8 INTERMEDIATE Intermediate     PIP/TAZO 8 SENSITIVE Sensitive     TOBRAMYCIN <=1 SENSITIVE Sensitive     * AEROMONAS HYDROPHILA GROUP  Urine culture     Status: None   Collection Time: 09/28/14  9:28 AM  Result Value Ref Range Status   Specimen Description URINE, RANDOM  Final   Special Requests NONE  Final   Colony Count   Final    >=100,000 COLONIES/ML Performed at [facility]    Culture   Final    ESCHERICHIA COLI Performed at [facility]    Report Status 09/30/2014 FINAL  Final   Organism ID, Bacteria ESCHERICHIA COLI  Final      Susceptibility   Escherichia coli -  MIC*    AMPICILLIN 8 SENSITIVE Sensitive     CEFAZOLIN <=4 SENSITIVE Sensitive     CEFTRIAXONE <=1 SENSITIVE Sensitive     CIPROFLOXACIN <=0.25 SENSITIVE Sensitive     GENTAMICIN <=1 SENSITIVE Sensitive     LEVOFLOXACIN <=0.12 SENSITIVE Sensitive     NITROFURANTOIN <=16 SENSITIVE Sensitive     TOBRAMYCIN <=1 SENSITIVE Sensitive     TRIMETH/SULFA <=20 SENSITIVE Sensitive     PIP/TAZO <=4 SENSITIVE Sensitive     * ESCHERICHIA COLI  Respiratory virus panel (routine influenza)     Status: None   Collection Time: 09/28/14 10:46 PM  Result Value Ref Range Status   Source - RVPAN NOSE  Final   Respiratory Syncytial Virus A NOT DETECTED  Final   Respiratory Syncytial Virus B NOT DETECTED  Final   Influenza A NOT DETECTED  Final   Influenza B NOT DETECTED  Final   Parainfluenza 1 NOT DETECTED  Final   Parainfluenza 2 NOT DETECTED  Final   Parainfluenza 3 NOT DETECTED  Final   Metapneumovirus NOT DETECTED  Final   Rhinovirus NOT DETECTED  Final   Adenovirus NOT DETECTED  Final   Influenza A  H1 NOT DETECTED  Final   Influenza A H3 NOT DETECTED  Final    Comment: (NOTE)       Normal Reference Range for each Analyte: NOT DETECTED Testing performed using the Luminex xTAG Respiratory Viral Panel test kit. The analytical performance characteristics of this assay have been determined by [facility].  The modifications have not been cleared or approved by the FDA. This assay has been validated pursuant to the CLIA regulations and is used for clinical purposes. Performed at [facility]      Scheduled Meds: . sodium chloride   Intravenous Once  . budesonide (PULMICORT) nebulizer solution  0.25 mg Nebulization BID  . ciprofloxacin  500 mg Oral BID  . feeding supplement (RESOURCE BREEZE)  1 Container Oral BID BM  . furosemide  40 mg Intravenous Daily  . insulin aspart  0-5 Units Subcutaneous QHS  . insulin aspart  0-9 Units Subcutaneous TID WC  . insulin detemir  6 Units Subcutaneous Daily  . LORazepam  0.5 mg Oral QHS  . metoprolol succinate  50 mg Oral q morning - 10a  . mometasone  1 application Topical Daily  . multivitamin with minerals  1 tablet Oral Daily  . pantoprazole  40 mg Oral Daily  . potassium chloride SA  20 mEq Oral BID  . prochlorperazine  10 mg Oral Daily  . sodium chloride  3 mL Intravenous Q12H  . traMADol  50 mg Oral BID   Continuous Infusions:    Fey Coghill, DO  [facility] Pager [phone]  If 7PM-7AM, please contact night-coverage [email or web address] Password [email or web address] 10/03/2014, 3:45 PM   LOS: 5 days

## 2014-10-03 NOTE — Progress Notes (Signed)
Pt BP high this evening. Pt is currently experiencing pain and was given tramdol less than 20 minutes ago along with ativan. Will review BP once pain medicine has had chance to work and reevaluate the patient.

## 2014-10-03 NOTE — Plan of Care (Signed)
Problem: Discharge Progression Outcomes Goal: Activity appropriate for discharge plan Outcome: Progressing Patient spoke with Case management today and agreed on home health services at discharge.  Goal: Other Discharge Outcomes/Goals Outcome: Progressing Patient and family are ready for discharge tomorrow.

## 2014-10-04 ENCOUNTER — Inpatient Hospital Stay (HOSPITAL_COMMUNITY): Payer: Medicare Other

## 2014-10-04 DIAGNOSIS — L409 Psoriasis, unspecified: Secondary | ICD-10-CM

## 2014-10-04 DIAGNOSIS — A498 Other bacterial infections of unspecified site: Secondary | ICD-10-CM

## 2014-10-04 DIAGNOSIS — R1110 Vomiting, unspecified: Secondary | ICD-10-CM

## 2014-10-04 DIAGNOSIS — R509 Fever, unspecified: Secondary | ICD-10-CM

## 2014-10-04 DIAGNOSIS — R1012 Left upper quadrant pain: Secondary | ICD-10-CM

## 2014-10-04 DIAGNOSIS — N189 Chronic kidney disease, unspecified: Secondary | ICD-10-CM

## 2014-10-04 DIAGNOSIS — R112 Nausea with vomiting, unspecified: Secondary | ICD-10-CM | POA: Insufficient documentation

## 2014-10-04 DIAGNOSIS — I5041 Acute combined systolic (congestive) and diastolic (congestive) heart failure: Secondary | ICD-10-CM

## 2014-10-04 DIAGNOSIS — A419 Sepsis, unspecified organism: Principal | ICD-10-CM

## 2014-10-04 DIAGNOSIS — E119 Type 2 diabetes mellitus without complications: Secondary | ICD-10-CM

## 2014-10-04 DIAGNOSIS — E876 Hypokalemia: Secondary | ICD-10-CM

## 2014-10-04 LAB — COMPREHENSIVE METABOLIC PANEL
ALT: 37 U/L — ABNORMAL HIGH (ref 0–35)
AST: 16 U/L (ref 0–37)
Albumin: 3.2 g/dL — ABNORMAL LOW (ref 3.5–5.2)
Alkaline Phosphatase: 98 U/L (ref 39–117)
Anion gap: 9 (ref 5–15)
BUN: 25 mg/dL — ABNORMAL HIGH (ref 6–23)
CALCIUM: 8.7 mg/dL (ref 8.4–10.5)
CO2: 22 mmol/L (ref 19–32)
CREATININE: 0.81 mg/dL (ref 0.50–1.10)
Chloride: 106 mEq/L (ref 96–112)
GFR calc Af Amer: 74 mL/min — ABNORMAL LOW (ref >90)
GFR, EST NON AFRICAN AMERICAN: 63 mL/min — AB (ref >90)
GLUCOSE: 141 mg/dL — AB (ref 70–99)
Potassium: 3.5 mmol/L (ref 3.5–5.1)
Sodium: 137 mmol/L (ref 135–145)
Total Bilirubin: 1 mg/dL (ref 0.3–1.2)
Total Protein: 6.5 g/dL (ref 6.0–8.3)

## 2014-10-04 LAB — GLUCOSE, CAPILLARY
GLUCOSE-CAPILLARY: 120 mg/dL — AB (ref 70–99)
GLUCOSE-CAPILLARY: 119 mg/dL — AB (ref 70–99)
GLUCOSE-CAPILLARY: 101 mg/dL — AB (ref 70–99)
Glucose-Capillary: 160 mg/dL — ABNORMAL HIGH (ref 70–99)

## 2014-10-04 LAB — BASIC METABOLIC PANEL
Anion gap: 4 — ABNORMAL LOW (ref 5–15)
BUN: 19 mg/dL (ref 6–23)
CALCIUM: 5.7 mg/dL — AB (ref 8.4–10.5)
CHLORIDE: 122 meq/L — AB (ref 96–112)
CO2: 18 mmol/L — ABNORMAL LOW (ref 19–32)
Creatinine, Ser: 0.45 mg/dL — ABNORMAL LOW (ref 0.50–1.10)
GFR calc non Af Amer: 88 mL/min — ABNORMAL LOW (ref >90)
Glucose, Bld: 80 mg/dL (ref 70–99)
Potassium: 2.6 mmol/L — CL (ref 3.5–5.1)
Sodium: 144 mmol/L (ref 135–145)

## 2014-10-04 LAB — MAGNESIUM
MAGNESIUM: 1.6 mg/dL (ref 1.5–2.5)
Magnesium: 1 mg/dL — ABNORMAL LOW (ref 1.5–2.5)

## 2014-10-04 LAB — BRAIN NATRIURETIC PEPTIDE: B Natriuretic Peptide: 362.4 pg/mL — ABNORMAL HIGH (ref 0.0–100.0)

## 2014-10-04 MED ORDER — FUROSEMIDE 40 MG PO TABS: 40.0000 mg | ORAL_TABLET | Freq: Every day | ORAL | Status: DC

## 2014-10-04 MED ORDER — LOSARTAN POTASSIUM 25 MG PO TABS: 25.0000 mg | ORAL_TABLET | Freq: Every day | ORAL | Status: DC

## 2014-10-04 MED ORDER — ONDANSETRON HCL 4 MG PO TABS: 4.0000 mg | ORAL_TABLET | Freq: Four times a day (QID) | ORAL | Status: DC

## 2014-10-04 MED ORDER — FUROSEMIDE 40 MG PO TABS
40.0000 mg | ORAL_TABLET | Freq: Every day | ORAL | Status: DC
  Administered 2014-10-04 – 2014-10-05 (×2): 40 mg via ORAL
  Filled 2014-10-04 (×2): qty 1

## 2014-10-04 MED ORDER — CIPROFLOXACIN HCL 500 MG PO TABS: 500.0000 mg | ORAL_TABLET | Freq: Two times a day (BID) | ORAL | Status: DC

## 2014-10-04 MED ORDER — POTASSIUM CHLORIDE CRYS ER 20 MEQ PO TBCR: 20.0000 meq | EXTENDED_RELEASE_TABLET | Freq: Every day | ORAL | Status: DC

## 2014-10-04 MED ORDER — ONDANSETRON HCL 4 MG PO TABS
4.0000 mg | ORAL_TABLET | Freq: Four times a day (QID) | ORAL | Status: DC
  Administered 2014-10-04 – 2014-10-05 (×5): 4 mg via ORAL
  Filled 2014-10-04 (×5): qty 1

## 2014-10-04 MED ORDER — LOSARTAN POTASSIUM 25 MG PO TABS
25.0000 mg | ORAL_TABLET | Freq: Every day | ORAL | Status: DC
  Administered 2014-10-04 – 2014-10-05 (×2): 25 mg via ORAL
  Filled 2014-10-04 (×2): qty 1

## 2014-10-04 MED ORDER — HYDROCODONE-ACETAMINOPHEN 5-325 MG PO TABS
1.0000 | ORAL_TABLET | ORAL | Status: DC | PRN
  Administered 2014-10-04 – 2014-10-05 (×3): 1 via ORAL
  Filled 2014-10-04 (×3): qty 1

## 2014-10-04 NOTE — Progress Notes (Signed)
Patient had one episode on vomiting this morning after eating her breakfast. Family has contraindicating facts on her nausea/vomiting. Son states this is the first episode but daughter is persistent in stating that she has had vomiting everyday while in the hospital. I have only witnessed the one vomiting episode this morning.

## 2014-10-04 NOTE — Progress Notes (Signed)
CRITICAL VALUE ALERT  Critical value received:  Calcium 5.7  Date of notification:  10/04/14  Time of notification:  0735  Critical value read back:Yes.    Nurse who received alert:  Rulon Abide, RN  MD notified (1st page):  Tat  Time of first page:  0736  MD notified (2nd page):  Time of second page:  Responding MD:  Tat  Time MD responded:  [phone]

## 2014-10-04 NOTE — Discharge Summary (Signed)
Physician Discharge Summary  Rebekah Cross JYN:[id number] DOB: November 22, 1924 DOA: 09/28/2014  PCP: Mayra Neer, MD  Admit date: 09/28/2014 Discharge date: 10/05/14 Recommendations for Outpatient Follow-up:  1. Pt will need to follow up with PCP in 2 weeks post discharge 2. Please obtain BMP and CBC in one week   Discharge Diagnoses:   sepsis/bacteremia due to Aeromonas -Patient currently afebrile and feeling better -WBC is trending down and no further tachycardia appreciated on exam. -d/c ceftriaxone and azithromycin -started ciprofloxacin 09/30/14-->change to po -Plan to go home with 10 additional days of ciprofloxacin which will complete 14 days of therapy  Acute Respiratory Failure  -secondary to community acquired pneumonia/bronchitis and CHF -d/c Rocephin and Zithromax -repeat chest x-ray--pulm vascular congestion, basilar opacities -continue flutter valve -Continue PRN oxygen supplementation -saline lock IVF -check proBNP--888 -good clinical response with IV Lasix -Patient will go home with furosemide 40 mg po daily Acute systolic and diastolic CHF -86/57/8469 echocardiogram EF 45-50 percent, grade 2 diastolic dysfunction, PAP 58 -Good clinical response with furosemide 40 mg IV daily -Daily weights--neg 5.8kg -Patient will go home with furosemide 40 mg daily Abdominal pain/vomiting -LUQ pain acute onset on 12/27--likely due to splenomegaly -CT abdomen/pelvis--new moderate bilateral pleural effusion with minimal pericardial effusion. Progressive hepatosplenomegaly -10/04/2014 repeat abdominal x-ray -pt with intermitten vomiting after meals -10/04/14 gastric emptying study  Myelodysplastic syndrome/Anemia -transfused one unit PRBC per med onc -appreciate Med Onc followup  essential hypertension:  -Blood pressure stable -d/c nisoldipine -Continue metoprolol succinate -restart low dose ARB--losartan 25 mg daily -d/c Benicar/HCTZ  GERD:  -Continue  PPI  diabetes mellitus:  -Decreased Levemir to 6 units-->no further hypoglycemia -Discontinue premeal novolog -change SSI to sensitive  MDS with 5q deletion -Further treatment and recommendations per oncology service -Dr. Alvy Bimler has been informed of patient admission.  acute kidney injury superimposed on chronic kidney disease stage III -Most likely secondary to mild dehydration and sepsis -Creatinine trended down -follow renal function  hypokalemia:  -Will replete as needed -home with KCL 69m daily  Discharge Condition: stable  Disposition: home  Diet:cardiac Wt Readings from Last 3 Encounters:  10/04/14 55.203 kg (121 lb 11.2 oz)  08/13/14 56.291 kg (124 lb 1.6 oz)  07/16/14 58.968 kg (130 lb)    History of present illness:  80yo female who has been transfusion dependent since 2014 (follows with Dr. GAlvy Bimler, per recent biopsy has low grade myelodysplastic syndrome (treated initially with Revlimide but developed an allergic reaction, [place] to weakness and frequent transfusion requirement), [facility] with main concern of several days duration of exertional dyspnea associated with productive cough of yellow sputum, subjective fevers, chills, poor oral intake. In addition, pt noted urinary urgency and frequency. This has also been associated with weakness and gait instability. Pt denies any specific abd concerns, no chest pain, no focal neurological symptoms, no recent sick contacts or exposures. After the patient was admitted, she was started on intravenous antibiotics after blood cultures were obtained. Blood cultures grew Aeromonas. Her antibiotics were switched to ciprofloxacin. The patient developed respiratory failure due to CHF. The patient was started on intravenous furosemide with good clinical response. He was switched to oral furosemide. The patient continued to have intermittent vomiting. Gastric venting study was  ordered.    Discharge Exam: Filed Vitals:   10/04/14 0558  BP: 150/46  Pulse:   Temp:   Resp:    Filed Vitals:   10/03/14 2352 10/04/14 0500 10/04/14  0558 10/04/14 0803  BP: 177/56 184/55 150/46   Pulse:  89    Temp:  98.4 F (36.9 C)    TempSrc:  Oral    Resp:  20    Height:      Weight:  55.203 kg (121 lb 11.2 oz)    SpO2:  94%  96%   General: A&O x 3, NAD, pleasant, cooperative Cardiovascular: RRR, no rub, no gallop, no S3 Respiratory: Fine bibasilar crackles. No wheezing. Good air movement. Abdomen:soft, nontender, nondistended, positive bowel sounds Extremities: trace LE edema, No lymphangitis, no petechiae  Discharge Instructions  Discharge Instructions    Diet - low sodium heart healthy    Complete by:  As directed      Increase activity slowly    Complete by:  As directed             Medication List    STOP taking these medications        metFORMIN 500 MG tablet  Commonly known as:  GLUCOPHAGE     nisoldipine 17 MG 24 hr tablet  Commonly known as:  SULAR     olmesartan-hydrochlorothiazide 40-25 MG per tablet  Commonly known as:  BENICAR HCT     predniSONE 10 MG tablet  Commonly known as:  DELTASONE     predniSONE 2.5 MG tablet  Commonly known as:  DELTASONE      TAKE these medications        acetaminophen 500 MG tablet  Commonly known as:  TYLENOL  Take 1,000 mg by mouth every 6 (six) hours as needed for fever (fever).     ciprofloxacin 500 MG tablet  Commonly known as:  CIPRO  Take 1 tablet (500 mg total) by mouth 2 (two) times daily.     desoximetasone 0.25 % cream  Commonly known as:  TOPICORT  Apply 1 application topically 2 (two) times daily as needed. For rash     furosemide 40 MG tablet  Commonly known as:  LASIX  Take 1 tablet (40 mg total) by mouth daily.     hydrOXYzine 25 MG capsule  Commonly known as:  VISTARIL  Take 1 capsule (25 mg total) by mouth every 4 (four) hours as needed.     insulin aspart 100 UNIT/ML  injection  Commonly known as:  novoLOG  Inject 15 Units into the skin 3 (three) times daily before meals. For Blood sugar over 200     insulin detemir 100 UNIT/ML injection  Commonly known as:  LEVEMIR  Inject 12 Units into the skin daily.     LORazepam 0.5 MG tablet  Commonly known as:  ATIVAN  Take 0.5 mg by mouth at bedtime.     losartan 25 MG tablet  Commonly known as:  COZAAR  Take 1 tablet (25 mg total) by mouth daily.     metoprolol succinate 100 MG 24 hr tablet  Commonly known as:  TOPROL-XL  Take 50 mg by mouth every morning.     mometasone 0.1 % cream  Commonly known as:  ELOCON  Apply 1 application topically daily as needed. Rash     multivitamin tablet  Take 1 tablet by mouth every morning.     omeprazole 40 MG capsule  Commonly known as:  PRILOSEC  Take 1 capsule (40 mg total) by mouth daily.     ondansetron 8 MG tablet  Commonly known as:  ZOFRAN  Take 1 tablet (8 mg total) by mouth 2 (two) times daily as  needed for nausea or vomiting.     ondansetron 4 MG tablet  Commonly known as:  ZOFRAN  Take 1 tablet (4 mg total) by mouth every 6 (six) hours.     potassium chloride SA 20 MEQ tablet  Commonly known as:  K-DUR,KLOR-CON  Take 1 tablet (20 mEq total) by mouth daily.     prochlorperazine 10 MG tablet  Commonly known as:  COMPAZINE  Take 10 mg by mouth daily.     ranitidine 75 MG tablet  Commonly known as:  ZANTAC  Take 1 tablet (75 mg total) by mouth at bedtime.     scopolamine 1 MG/3DAYS  Commonly known as:  TRANSDERM-SCOP  Place 1 patch (1.5 mg total) onto the skin every 3 (three) days.     traMADol 50 MG tablet  Commonly known as:  ULTRAM  Take 50 mg by mouth 2 (two) times daily.     triamcinolone cream 0.1 %  Commonly known as:  KENALOG         The results of significant diagnostics from this hospitalization (including imaging, microbiology, ancillary and laboratory) are listed below for reference.    Significant Diagnostic  Studies: Ct Abdomen Pelvis Wo Contrast  10/02/2014   CLINICAL DATA:  Low abdominal pain with nausea, vomiting and diarrhea for 3 days. History of chronic kidney disease, myelodysplastic syndrome, hypertension and diabetes. Initial encounter.  EXAM: CT ABDOMEN AND PELVIS WITHOUT CONTRAST  TECHNIQUE: Multidetector CT imaging of the abdomen and pelvis was performed following the standard protocol without IV contrast.  COMPARISON:  Abdominal pelvic CT 07/18/2014.  FINDINGS: Lower chest: There are new moderate size dependent pleural effusions bilaterally with associated bibasilar pulmonary opacities, likely atelectasis. The heart is enlarged. There is minimal pericardial fluid. There is mild atherosclerosis of the aorta and coronary arteries. Calcified right hilar lymph nodes are noted. Central venous catheter tip is at the SVC right atrial junction.  Hepatobiliary: There is progressive hepatomegaly. The liver measures up to 21.6 cm transverse and 22.4 cm cephalocaudad, extending into the false pelvis. There is a probable new ill-defined low-density lesion inferiorly in the right hepatic lobe, measuring 2.5 x 2.7 cm on image 50. Evaluation of this area is mildly limited by breathing artifact. No other liver lesions demonstrated. The gallbladder appears contracted without wall thickening or calcified gallstones. There is no biliary dilatation.  Pancreas: Atrophied without apparent focal abnormality on non contrast imaging.  Spleen: Mildly enlarged compared with the prior study, measuring up to 14.6 cm in height. No focal lesions identified aside from calcified granulomas inferiorly.  Adrenals/Urinary Tract: Both adrenal glands appear normal.The kidneys appear normal without evidence of urinary tract calculus or hydronephrosis. No bladder abnormalities are seen.  Stomach/Bowel: No evidence of bowel wall thickening, distention or surrounding inflammatory change.There is stable mild distal colonic diverticulosis. No  extraluminal fluid collections demonstrated.  Vascular/Lymphatic: There are no enlarged abdominal or pelvic lymph nodes. Diffuse aortoiliac atherosclerosis and multiple pelvic phleboliths are stable.  Reproductive: Status post hysterectomy. No evidence of adnexal mass. Pelvic floor laxity likely.  Other: There is some thinning of the anterior abdominal wall inferiorly without focal hernia.  Musculoskeletal: Generalized osteosclerosis appears stable. No focal lytic lesion or pathologic fracture demonstrated. There is multilevel disc and endplate degeneration throughout the thoracolumbar spine, grossly stable.  IMPRESSION: 1. Mild interval progression of hepatosplenomegaly. There is a questionable new lesion inferiorly in the right hepatic lobe versus artifact. 2. New moderate pleural effusions with associated probable bibasilar atelectasis. 3. No other  significant changes identified. No evidence of active intra-abdominal inflammatory process. 4. Diffuse atherosclerosis.   Electronically Signed   By: Camie Patience M.D.   On: 10/02/2014 08:25   Dg Chest 2 View  09/30/2014   CLINICAL DATA:  Shortness of breath, fever and weakness for 4 days, hypertension, myelodysplastic syndrome, type II diabetes  EXAM: CHEST  2 VIEW  COMPARISON:  09/28/2014  FINDINGS: RIGHT jugular Port-A-Cath stable tip projecting over mid SVC.  Enlargement of cardiac silhouette with pulmonary vascular congestion.  Calcified mildly tortuous thoracic aorta.  Bibasilar pleural effusions and atelectasis.  Remaining lungs clear.  No pneumothorax.  IMPRESSION: Enlargement of cardiac silhouette with pulmonary vascular congestion.  New bibasilar pleural effusions and atelectasis.   Electronically Signed   By: Lavonia Dana M.D.   On: 09/30/2014 15:16   Dg Chest 2 View  09/28/2014   CLINICAL DATA:  Recent fall, shortness of Breath  EXAM: CHEST  2 VIEW  COMPARISON:  08/27/2013  FINDINGS: A new right-sided chest wall port is noted in satisfactory  position. The cardiac shadow is stable. The lungs are well aerated bilaterally without focal infiltrate. No acute bony abnormality is noted.  IMPRESSION: No active cardiopulmonary disease.   Electronically Signed   By: Inez Catalina M.D.   On: 09/28/2014 08:38     Microbiology: Recent Results (from the past 240 hour(s))  Culture, blood (routine x 2)     Status: None   Collection Time: 09/28/14  8:16 AM  Result Value Ref Range Status   Specimen Description BLOOD RIGHT CHEST  Final   Special Requests BOTTLES DRAWN AEROBIC AND ANAEROBIC 4ML  Final   Culture   Final    AEROMONAS HYDROPHILA GROUP Note: SUSCEPTIBILITIES PERFORMED ON PREVIOUS CULTURE WITHIN THE LAST 5 DAYS. Note: Gram Stain Report Called to,Read Back By and Verified With: JESSICA ASARO ON 09/28/2014 AT 10:23P BY WILEJ Performed at [facility]    Report Status 09/30/2014 FINAL  Final  Culture, blood (routine x 2)     Status: None   Collection Time: 09/28/14  8:38 AM  Result Value Ref Range Status   Specimen Description BLOOD LEFT ANTECUBITAL  Final   Special Requests BOTTLES DRAWN AEROBIC AND ANAEROBIC 5CC  Final   Culture   Final    AEROMONAS HYDROPHILA GROUP Note: Gram Stain Report Called to,Read Back By and Verified With: JESSICA ASARO ON 09/29/2014 AT 12:06A BY WILEJ Performed at [facility]    Report Status 09/30/2014 FINAL  Final   Organism ID, Bacteria AEROMONAS HYDROPHILA GROUP  Final      Susceptibility   Aeromonas hydrophila group - MIC*    CEFTAZIDIME <=1 SENSITIVE Sensitive     CIPROFLOXACIN <=0.25 SENSITIVE Sensitive     GENTAMICIN <=1 SENSITIVE Sensitive     IMIPENEM 8 INTERMEDIATE Intermediate     PIP/TAZO 8 SENSITIVE Sensitive     TOBRAMYCIN <=1 SENSITIVE Sensitive     * AEROMONAS HYDROPHILA GROUP  Urine culture     Status: None   Collection Time: 09/28/14  9:28 AM  Result Value Ref Range Status   Specimen Description URINE, RANDOM  Final   Special Requests NONE  Final   Colony  Count   Final    >=100,000 COLONIES/ML Performed at [facility]    Culture   Final    ESCHERICHIA COLI Performed at [facility]    Report Status 09/30/2014 FINAL  Final   Organism ID, Bacteria ESCHERICHIA COLI  Final  Susceptibility   Escherichia coli - MIC*    AMPICILLIN 8 SENSITIVE Sensitive     CEFAZOLIN <=4 SENSITIVE Sensitive     CEFTRIAXONE <=1 SENSITIVE Sensitive     CIPROFLOXACIN <=0.25 SENSITIVE Sensitive     GENTAMICIN <=1 SENSITIVE Sensitive     LEVOFLOXACIN <=0.12 SENSITIVE Sensitive     NITROFURANTOIN <=16 SENSITIVE Sensitive     TOBRAMYCIN <=1 SENSITIVE Sensitive     TRIMETH/SULFA <=20 SENSITIVE Sensitive     PIP/TAZO <=4 SENSITIVE Sensitive     * ESCHERICHIA COLI  Respiratory virus panel (routine influenza)     Status: None   Collection Time: 09/28/14 10:46 PM  Result Value Ref Range Status   Source - RVPAN NOSE  Final   Respiratory Syncytial Virus A NOT DETECTED  Final   Respiratory Syncytial Virus B NOT DETECTED  Final   Influenza A NOT DETECTED  Final   Influenza B NOT DETECTED  Final   Parainfluenza 1 NOT DETECTED  Final   Parainfluenza 2 NOT DETECTED  Final   Parainfluenza 3 NOT DETECTED  Final   Metapneumovirus NOT DETECTED  Final   Rhinovirus NOT DETECTED  Final   Adenovirus NOT DETECTED  Final   Influenza A H1 NOT DETECTED  Final   Influenza A H3 NOT DETECTED  Final    Comment: (NOTE)       Normal Reference Range for each Analyte: NOT DETECTED Testing performed using the Luminex xTAG Respiratory Viral Panel test kit. The analytical performance characteristics of this assay have been determined by [facility].  The modifications have not been cleared or approved by the FDA. This assay has been validated pursuant to the CLIA regulations and is used for clinical purposes. Performed at [employer] [facility]: Basic Metabolic Panel:  Recent Labs Lab 09/28/14 1230 09/29/14 0502 10/01/14 0414  10/02/14 0500 10/03/14 0420 10/04/14 0450 10/04/14 0825  NA 137 134* 138 137 137 144 137  K 3.1* 3.5 4.2 4.7 3.9 2.6* 3.5  CL 99 101 109 108 103 122* 106  CO2 29 26 26 23 25  18* 22  GLUCOSE 146* 106* 97 143* 102* 80 141*  BUN 42* 35* 20 20 20 19  25*  CREATININE 1.30* 1.01 0.68 0.76 0.73 0.45* 0.81  CALCIUM 8.1* 7.6* 7.8* 8.3* 8.4 5.7* 8.7  MG 1.1* 2.0  --   --   --  1.0* 1.6  PHOS 3.5  --   --   --   --   --   --    Liver Function Tests:  Recent Labs Lab 09/28/14 0816 09/28/14 1230 09/29/14 0502 10/02/14 0500 10/04/14 0825  AST 45* 37 39* 22 16  ALT 76* 70* 73* 54* 37*  ALKPHOS 70 67 85 125* 98  BILITOT 2.2* 1.7* 2.0* 1.4* 1.0  PROT 6.3 5.9* 5.5* 6.0 6.5  ALBUMIN 3.4* 3.0* 2.6* 2.9* 3.2*   No results for input(s): LIPASE, AMYLASE in the last 168 hours. No results for input(s): AMMONIA in the last 168 hours. CBC:  Recent Labs Lab 09/28/14 0816 09/28/14 1230 09/29/14 0502 10/01/14 0414 10/02/14 0400  WBC 12.4* 9.2 7.9 6.6 9.1  NEUTROABS 11.2* 8.1*  --   --  7.0  HGB 9.1* 8.3* 7.9* 7.9* 9.0*  HCT 26.5* 24.4* 23.6* 23.9* 27.0*  MCV 80.1 80.3 81.4 81.3 81.3  PLT 71* 64* 54* 77* 100*   Cardiac Enzymes: No results for input(s): CKTOTAL, CKMB, CKMBINDEX, TROPONINI in the last 168 hours.  BNP: Invalid input(s): POCBNP CBG:  Recent Labs Lab 10/03/14 0731 10/03/14 1154 10/03/14 1636 10/03/14 2130 10/04/14 0747  GLUCAP 102* 109* 149* 176* 160*    Time coordinating discharge:  Greater than 30 minutes  Signed:  Brittley Regner, DO [facility] Pager: [phone] 10/04/2014, 10:54 AM

## 2014-10-04 NOTE — Progress Notes (Signed)
Rebekah Cross   DOB:07-22-1925   VZ#:[id number]   AJO#:[id number]  Patient Care Team: Mayra Neer, MD as PCP - General (Family Medicine) Heath Lark, MD as Consulting Physician (Hematology and Oncology)  I have seen the patient, examined her and edited the notes as follows  Subjective: Feeling well this morning. Afebrile. Denies any shortness of breath, cough or chest pain. Denies any nausea, vomiting or diarrhea. Left upper quadrant pain well controlled. She denies any bleeding issues such as epistaxis, hematemesis, hematochezia or hematuria. Ambulates with assistance.   Scheduled Meds: . sodium chloride   Intravenous Once  . budesonide (PULMICORT) nebulizer solution  0.25 mg Nebulization BID  . ciprofloxacin  500 mg Oral BID  . feeding supplement (RESOURCE BREEZE)  1 Container Oral BID BM  . furosemide  40 mg Intravenous Daily  . insulin aspart  0-5 Units Subcutaneous QHS  . insulin aspart  0-9 Units Subcutaneous TID WC  . insulin detemir  6 Units Subcutaneous Daily  . LORazepam  0.5 mg Oral QHS  . metoprolol succinate  50 mg Oral q morning - 10a  . mometasone  1 application Topical Daily  . multivitamin with minerals  1 tablet Oral Daily  . pantoprazole  40 mg Oral Daily  . potassium chloride SA  20 mEq Oral BID  . prochlorperazine  10 mg Oral Daily  . sodium chloride  3 mL Intravenous Q12H  . traMADol  50 mg Oral BID   Continuous Infusions:  PRN Meds:acetaminophen, acetaminophen, diphenhydrAMINE, hydrALAZINE, hydrOXYzine, ipratropium-albuterol, morphine injection, ondansetron **OR** ondansetron (ZOFRAN) IV, sodium chloride   Objective:  Filed Vitals:   10/04/14 0558  BP: 150/46  Pulse:   Temp:   Resp:       Intake/Output Summary (Last 24 hours) at 10/04/14 0801 Last data filed at 10/03/14 2200  Gross per 24 hour  Intake    120 ml  Output      0 ml  Net    120 ml    GENERAL:alert, no distress and comfortable SKIN: skin color, texture, turgor are normal, no  rashes EYES: normal, conjunctiva are pink and non-injected, sclera clear OROPHARYNX:no exudate, no erythema and lips, buccal mucosa, and tongue normal  NECK: supple, thyroid normal size, non-tender, without nodularity LYMPH:  no palpable lymphadenopathy in the cervical, axillary or inguinal LUNGS: clear to auscultation and percussion with normal breathing effort HEART: regular rate & rhythm and no murmurs and no lower extremity edema ABDOMEN:abdomen soft, tender at the left flank, and normal bowel sounds Musculoskeletal:no cyanosis of digits and no clubbing  PSYCH: alert & oriented x 3 with fluent speech NEURO: no focal motor/sensory deficits    Labs:   Recent Labs Lab 09/28/14 0816 09/28/14 1230 09/29/14 0502 10/01/14 0414 10/02/14 0400  WBC 12.4* 9.2 7.9 6.6 9.1  HGB 9.1* 8.3* 7.9* 7.9* 9.0*  HCT 26.5* 24.4* 23.6* 23.9* 27.0*  PLT 71* 64* 54* 77* 100*  MCV 80.1 80.3 81.4 81.3 81.3  MCH 27.5 27.3 27.2 26.9 27.1  MCHC 34.3 34.0 33.5 33.1 33.3  RDW 14.9 15.0 15.1 15.0 15.3  LYMPHSABS 0.4* 0.4*  --   --  0.7  MONOABS 0.4 0.3  --   --  0.4  EOSABS 0.4 0.3  --   --  0.9*  BASOSABS 0.0 0.1  --   --  0.1     Chemistries:    Recent Labs Lab 09/28/14 0816 09/28/14 1230 09/29/14 0502 10/01/14 0414 10/02/14 0500 10/03/14 0420 10/04/14 0450  NA  137 137 134* 138 137 137 144  K 2.7* 3.1* 3.5 4.2 4.7 3.9 2.6*  CL 99 99 101 109 108 103 122*  CO2 26 29 26 26 23 25  18*  GLUCOSE 122* 146* 106* 97 143* 102* 80  BUN 47* 42* 35* 20 20 20 19   CREATININE 1.51* 1.30* 1.01 0.68 0.76 0.73 0.45*  CALCIUM 8.3* 8.1* 7.6* 7.8* 8.3* 8.4 5.7*  MG  --  1.1* 2.0  --   --   --  1.0*  AST 45* 37 39*  --  22  --   --   ALT 76* 70* 73*  --  54*  --   --   ALKPHOS 70 67 85  --  125*  --   --   BILITOT 2.2* 1.7* 2.0*  --  1.4*  --   --     GFR Estimated Creatinine Clearance: 38.6 mL/min (by C-G formula based on Cr of 0.45).  Liver Function Tests:  Recent Labs Lab 09/28/14 0816  09/28/14 1230 09/29/14 0502 10/02/14 0500  AST 45* 37 39* 22  ALT 76* 70* 73* 54*  ALKPHOS 70 67 85 125*  BILITOT 2.2* 1.7* 2.0* 1.4*  PROT 6.3 5.9* 5.5* 6.0  ALBUMIN 3.4* 3.0* 2.6* 2.9*    Coagulation profile  Recent Labs Lab 09/28/14 1230  INR 1.46   CBG:  Recent Labs Lab 10/03/14 0731 10/03/14 1154 10/03/14 1636 10/03/14 2130 10/04/14 0747  GLUCAP 102* 109* 149* 176* 160*      Assessment/Plan: 80 y.o.  MDS (myelodysplastic syndrome) with 5q deletion Overall, she tolerated treatment very poorly. Continue to hold chemotherapy and continue supportive care only.  She will also continue on prednisone daily  Anemia in neoplastic disease This is likely due to recent treatment.  The patient denies recent history of bleeding such as epistaxis, hematuria or hematochezia.  She was symptomatic from the anemia on admission requiring transfusion. She received 2 units of blood to date with good response.  No labs are available today The plan would be to give her blood whenever her hemoglobin dropped to less than 8 g  Thrombocytopenia Likely secondary to neoplastic disease, dilution, and acute infection, antibiotics No new labs are available to compare, but no bleeding issues are present This can be followed as outpatient  Psoriasis This has flared up since recent prednisone taper. Continue low dose prednisone  Suspected Sepsis-bacteremia due to Aeromonas E Coli UTI She was initially treated with IV ceftriaxone and azithromycin, which them were switched to Cipro, along with hydration, with improvement of symptoms Changes in antibiotics as per admitting team, currently on oral Cipro, day 5  Left Upper Quadrant Pain In the setting of UTI, acute kidney injury on chronic kidney disease, and liver and spleen enlargement CT of the Abdomen and Pelvis on 12/28 was remarkable for mild interval progression of hepatosplenomegaly related to extramedullary  erythropoiesis There is a questionable new lesion inferiorly in the right hepatic lobe which could be artifact.  Pain is improved from prior day  Moderate nausea and vomiting suspect they might be an element of gastritis. She will continue proton pump inhibitor.  Low grade fever Likely due to infection Resolved with antibiotics and supportive care.  Acute respiratory failure Patient was placed on the antibiotics, Pulmicort and albuterol, oxygen and IV fluids, with good response Her symptoms are improved. Appreciate primary team Involvement  Acute systolic and diastolic congestive heart failure 2 D echo on 12/29 showed grade 2  Diastolic CHF She  was initiated on Lasix IV Appreciate primary team follow up  Hypokalemia Due to Lasix, replace per primary service  DVT prophylaxis On mechanical devices  Full code  Disposition:  To home today or tomorrow if clinically stable. Patient has an appointment with Dr. Alvy Bimler on 1/4 at 10 a.m (labs at 9:30 am)   Other medical issues such as diabetes mellitus, Chronic kidney disease, Hypokalemia, Hypocalcemia and CHF as per admitting team    **Disclaimer: This note was dictated with voice recognition software. Similar sounding words can inadvertently be transcribed and this note may contain transcription errors which may not have been corrected upon publication of note.Sharene Butters E, PA-C 10/04/2014  8:01 AM Nyoka Alcoser, MD 10/04/2014

## 2014-10-04 NOTE — Progress Notes (Signed)
CRITICAL VALUE ALERT  Critical value received:  K+ 2.6   Date of notification:  10/04/14  Time of notification:  0725  Critical value read back:Yes.    Nurse who received alert:  Rulon Abide, RN  MD notified (1st page):  Tat  Time of first page:  0735  MD notified (2nd page):  Time of second page:  Responding MD:  Tat  Time MD responded:  [phone]

## 2014-10-05 DIAGNOSIS — R7881 Bacteremia: Secondary | ICD-10-CM | POA: Insufficient documentation

## 2014-10-05 LAB — BASIC METABOLIC PANEL
Anion gap: 5 (ref 5–15)
BUN: 30 mg/dL — AB (ref 6–23)
CO2: 23 mmol/L (ref 19–32)
CREATININE: 0.79 mg/dL (ref 0.50–1.10)
Calcium: 8.3 mg/dL — ABNORMAL LOW (ref 8.4–10.5)
Chloride: 107 mEq/L (ref 96–112)
GFR calc Af Amer: 84 mL/min — ABNORMAL LOW (ref >90)
GFR, EST NON AFRICAN AMERICAN: 73 mL/min — AB (ref >90)
GLUCOSE: 106 mg/dL — AB (ref 70–99)
Potassium: 3.9 mmol/L (ref 3.5–5.1)
Sodium: 135 mmol/L (ref 135–145)

## 2014-10-05 LAB — CBC
HEMATOCRIT: 26.5 % — AB (ref 36.0–46.0)
HEMOGLOBIN: 8.7 g/dL — AB (ref 12.0–15.0)
MCH: 26.9 pg (ref 26.0–34.0)
MCHC: 32.8 g/dL (ref 30.0–36.0)
MCV: 82 fL (ref 78.0–100.0)
PLATELETS: 106 10*3/uL — AB (ref 150–400)
RBC: 3.23 MIL/uL — ABNORMAL LOW (ref 3.87–5.11)
RDW: 14.9 % (ref 11.5–15.5)
WBC: 3.2 10*3/uL — ABNORMAL LOW (ref 4.0–10.5)

## 2014-10-05 LAB — GLUCOSE, CAPILLARY
GLUCOSE-CAPILLARY: 92 mg/dL (ref 70–99)
Glucose-Capillary: 110 mg/dL — ABNORMAL HIGH (ref 70–99)

## 2014-10-05 MED ORDER — FUROSEMIDE 40 MG PO TABS: 40.0000 mg | ORAL_TABLET | Freq: Every day | ORAL | Status: DC

## 2014-10-05 MED ORDER — POTASSIUM CHLORIDE CRYS ER 20 MEQ PO TBCR: 20.0000 meq | EXTENDED_RELEASE_TABLET | Freq: Every day | ORAL | Status: AC

## 2014-10-05 MED ORDER — HEPARIN SOD (PORK) LOCK FLUSH 100 UNIT/ML IV SOLN
500.0000 [IU] | INTRAVENOUS | Status: AC | PRN
  Administered 2014-10-05: 500 [IU]

## 2014-10-05 MED ORDER — HYDROCODONE-ACETAMINOPHEN 5-325 MG PO TABS: 1.0000 | ORAL_TABLET | ORAL | Status: DC | PRN

## 2014-10-05 MED ORDER — ONDANSETRON HCL 4 MG PO TABS: 4.0000 mg | ORAL_TABLET | Freq: Four times a day (QID) | ORAL | Status: DC

## 2014-10-05 MED ORDER — LOSARTAN POTASSIUM 25 MG PO TABS: 25.0000 mg | ORAL_TABLET | Freq: Every day | ORAL | Status: DC

## 2014-10-05 MED ORDER — CIPROFLOXACIN HCL 500 MG PO TABS: 500.0000 mg | ORAL_TABLET | Freq: Two times a day (BID) | ORAL | Status: DC

## 2014-10-05 MED ORDER — PREDNISONE 10 MG PO TABS: 10.0000 mg | ORAL_TABLET | Freq: Every day | ORAL | Status: DC

## 2014-10-05 NOTE — Progress Notes (Signed)
Patient discharged home with daughter, discharge instructions given and explained to patient/daughter and they verbalized understanding,patient denies any pain/distress, accompanied home by family via personal car. Skin intact, no wound noted.

## 2014-10-05 NOTE — Progress Notes (Signed)
[facility] PROGRESS NOTE  Rebekah Cross LKG:[id number] DOB: Dec 24, 1924 DOA: 09/28/2014 PCP: Mayra Neer, MD  For the rest of the hospitalization please refer to discharge summary dictated by Dr. Carles Collet   Assessment/Plan: #1 sepsis/bacteremia due to Aeromas Clinical improvement. Patient afebrile. WBC trending down. Tachycardia has resolved. Patient tolerating oral ciprofloxacin. Patient will be discharged home on 9 more days of oral ciprofloxacin to complete a two-week course of antibiotic therapy. Outpatient follow-up.  #2 acute respiratory failure Secondary to community acquired pneumonia/bronchitis and CHF exacerbation. Clinical improvement. Patient closed to baseline. She was empirically on IV Rocephin and azithromycin and subsequently transitioned to oral ciprofloxacin. Repeat chest x-ray showed vascular congestion with basilar opacities. Patient was maintained on a flutter valve, oxygen as needed. Patient was diuresed with IV Lasix and subsequently transitioned to oral Lasix which should be discharged home on.  #3 acute systolic and diastolic heart failure. 2-D echo from 10/02/2014 with a EF of 66-44%, grade 2 diastolic dysfunction. Good clinical response with Lasix 40 mg IV daily.  #4 abdominal pain/emesis Patient with left upper quadrant abdominal pain acute onset 09/30/2014 likely secondary to splenomegaly. Clinical improvement. See detailed abdomen and pelvis with new moderate bilateral pleural effusion minimal pericardial effusion. Progressive hepatosplenomegaly. 10/04/2014 repeat abdominal x-ray is unremarkable. Patient with intermittent emesis after meals. Gastric emptying study has been scheduled for 10/08/2013.  #5 myelodysplastic syndrome/anemia Patient is status post 1 unit packed red blood cells. Hematology/oncology following.  #6 essential hypertension Blood pressure is stable. Continue metoprolol.  #7 gastroesophageal reflux disease PPI.  #8 diabetes  mellitus Continue Levemir and sliding scale insulin.  #9 MDS with 5q deletion Outpatient follow-up with Dr. Alvy Bimler of hematology.  #10 acute kidney injury superimposed on chronic kidney disease stage III Likely secondary to prerenal azotemia and sepsis. Creatinine is trending down. Follow.  #11 hypokalemia Likely secondary to diuretics is an GI losses.  Code Status: Full Family Communication: Updated patient and daughter at bedside. Disposition Plan: Discharge home.   Consultants:  Hematology/oncology Dr.Gorsuch 10/01/2014  Procedures:  Chest x-ray 09/28/2014, 09/30/2014  KUB 10/04/2014  CT abdomen and pelvis 10/01/2014  2-D echo 10/02/2014  Antibiotics:  IV azithromycin 09/28/2014>>> 09/30/2014  IV Rocephin 09/28/2014>>>> 09/30/2014  IV ciprofloxacin 09/30/2014>>>> 10/03/2014  Oral ciprofloxacin 10/03/2014  HPI/Subjective: Patient states she's feeling better. Patient states pain is controlled. Patient states tolerated breakfast this morning. Patient denies any nausea, no vomiting.  Objective: Filed Vitals:   10/05/14 1047  BP: 149/47  Pulse: 80  Temp:   Resp:     Intake/Output Summary (Last 24 hours) at 10/05/14 1314 Last data filed at 10/05/14 1313  Gross per 24 hour  Intake    730 ml  Output    100 ml  Net    630 ml   Filed Weights   10/03/14 0500 10/04/14 0500 10/05/14 0619  Weight: 55.339 kg (122 lb) 55.203 kg (121 lb 11.2 oz) 54.749 kg (120 lb 11.2 oz)    Exam:   General:  NAD  Cardiovascular: RRR  Respiratory: CTAB  Abdomen: Soft, nontender, nondistended, positive bowel sounds.  Musculoskeletal: No clubbing cyanosis or edema.  Data Reviewed: Basic Metabolic Panel:  Recent Labs Lab 09/29/14 0502  10/02/14 0500 10/03/14 0420 10/04/14 0450 10/04/14 0825 10/05/14 0610  NA 134*  < > 137 137 144 137 135  K 3.5  < > 4.7 3.9 2.6* 3.5 3.9  CL 101  < > 108 103 122* 106 107  CO2 26  < > 23 25 18* 22  23  GLUCOSE 106*  < > 143*  102* 80 141* 106*  BUN 35*  < > 20 20 19  25* 30*  CREATININE 1.01  < > 0.76 0.73 0.45* 0.81 0.79  CALCIUM 7.6*  < > 8.3* 8.4 5.7* 8.7 8.3*  MG 2.0  --   --   --  1.0* 1.6  --   < > = values in this interval not displayed. Liver Function Tests:  Recent Labs Lab 09/29/14 0502 10/02/14 0500 10/04/14 0825  AST 39* 22 16  ALT 73* 54* 37*  ALKPHOS 85 125* 98  BILITOT 2.0* 1.4* 1.0  PROT 5.5* 6.0 6.5  ALBUMIN 2.6* 2.9* 3.2*   No results for input(s): LIPASE, AMYLASE in the last 168 hours. No results for input(s): AMMONIA in the last 168 hours. CBC:  Recent Labs Lab 09/29/14 0502 10/01/14 0414 10/02/14 0400 10/05/14 0610  WBC 7.9 6.6 9.1 3.2*  NEUTROABS  --   --  7.0  --   HGB 7.9* 7.9* 9.0* 8.7*  HCT 23.6* 23.9* 27.0* 26.5*  MCV 81.4 81.3 81.3 82.0  PLT 54* 77* 100* 106*   Cardiac Enzymes: No results for input(s): CKTOTAL, CKMB, CKMBINDEX, TROPONINI in the last 168 hours. BNP (last 3 results) No results for input(s): PROBNP in the last 8760 hours. CBG:  Recent Labs Lab 10/04/14 1202 10/04/14 1644 10/04/14 2131 10/05/14 0744 10/05/14 1155  GLUCAP 120* 119* 101* 92 110*    Recent Results (from the past 240 hour(s))  Culture, blood (routine x 2)     Status: None   Collection Time: 09/28/14  8:16 AM  Result Value Ref Range Status   Specimen Description BLOOD RIGHT CHEST  Final   Special Requests BOTTLES DRAWN AEROBIC AND ANAEROBIC 4ML  Final   Culture   Final    AEROMONAS HYDROPHILA GROUP Note: SUSCEPTIBILITIES PERFORMED ON PREVIOUS CULTURE WITHIN THE LAST 5 DAYS. Note: Gram Stain Report Called to,Read Back By and Verified With: JESSICA ASARO ON 09/28/2014 AT 10:23P BY WILEJ Performed at [facility]    Report Status 09/30/2014 FINAL  Final  Culture, blood (routine x 2)     Status: None   Collection Time: 09/28/14  8:38 AM  Result Value Ref Range Status   Specimen Description BLOOD LEFT ANTECUBITAL  Final   Special Requests BOTTLES DRAWN AEROBIC AND  ANAEROBIC 5CC  Final   Culture   Final    AEROMONAS HYDROPHILA GROUP Note: Gram Stain Report Called to,Read Back By and Verified With: JESSICA ASARO ON 09/29/2014 AT 12:06A BY WILEJ Performed at [facility]    Report Status 09/30/2014 FINAL  Final   Organism ID, Bacteria AEROMONAS HYDROPHILA GROUP  Final      Susceptibility   Aeromonas hydrophila group - MIC*    CEFTAZIDIME <=1 SENSITIVE Sensitive     CIPROFLOXACIN <=0.25 SENSITIVE Sensitive     GENTAMICIN <=1 SENSITIVE Sensitive     IMIPENEM 8 INTERMEDIATE Intermediate     PIP/TAZO 8 SENSITIVE Sensitive     TOBRAMYCIN <=1 SENSITIVE Sensitive     * AEROMONAS HYDROPHILA GROUP  Urine culture     Status: None   Collection Time: 09/28/14  9:28 AM  Result Value Ref Range Status   Specimen Description URINE, RANDOM  Final   Special Requests NONE  Final   Colony Count   Final    >=100,000 COLONIES/ML Performed at [facility]    Culture   Final    ESCHERICHIA COLI Performed at  [facility]    Report Status 09/30/2014 FINAL  Final   Organism ID, Bacteria ESCHERICHIA COLI  Final      Susceptibility   Escherichia coli - MIC*    AMPICILLIN 8 SENSITIVE Sensitive     CEFAZOLIN <=4 SENSITIVE Sensitive     CEFTRIAXONE <=1 SENSITIVE Sensitive     CIPROFLOXACIN <=0.25 SENSITIVE Sensitive     GENTAMICIN <=1 SENSITIVE Sensitive     LEVOFLOXACIN <=0.12 SENSITIVE Sensitive     NITROFURANTOIN <=16 SENSITIVE Sensitive     TOBRAMYCIN <=1 SENSITIVE Sensitive     TRIMETH/SULFA <=20 SENSITIVE Sensitive     PIP/TAZO <=4 SENSITIVE Sensitive     * ESCHERICHIA COLI  Respiratory virus panel (routine influenza)     Status: None   Collection Time: 09/28/14 10:46 PM  Result Value Ref Range Status   Source - RVPAN NOSE  Final   Respiratory Syncytial Virus A NOT DETECTED  Final   Respiratory Syncytial Virus B NOT DETECTED  Final   Influenza A NOT DETECTED  Final   Influenza B NOT DETECTED  Final   Parainfluenza 1 NOT DETECTED   Final   Parainfluenza 2 NOT DETECTED  Final   Parainfluenza 3 NOT DETECTED  Final   Metapneumovirus NOT DETECTED  Final   Rhinovirus NOT DETECTED  Final   Adenovirus NOT DETECTED  Final   Influenza A H1 NOT DETECTED  Final   Influenza A H3 NOT DETECTED  Final    Comment: (NOTE)       Normal Reference Range for each Analyte: NOT DETECTED Testing performed using the Luminex xTAG Respiratory Viral Panel test kit. The analytical performance characteristics of this assay have been determined by [facility].  The modifications have not been cleared or approved by the FDA. This assay has been validated pursuant to the CLIA regulations and is used for clinical purposes. Performed at [facility]      Studies: Dg Abd 2 Views  10/04/2014   CLINICAL DATA:  Generalized abdominal pain, vomiting, weakness.  EXAM: ABDOMEN - 2 VIEW  COMPARISON:  September 17, 2010.  FINDINGS: The bowel gas pattern is normal. There is no evidence of free air. Phleboliths are noted in the pelvis. Residual contrast is noted in the colon.  IMPRESSION: No evidence of bowel obstruction or ileus.   Electronically Signed   By: Sabino Dick M.D.   On: 10/04/2014 14:55    Scheduled Meds: . sodium chloride   Intravenous Once  . budesonide (PULMICORT) nebulizer solution  0.25 mg Nebulization BID  . ciprofloxacin  500 mg Oral BID  . feeding supplement (RESOURCE BREEZE)  1 Container Oral BID BM  . furosemide  40 mg Oral Daily  . insulin aspart  0-5 Units Subcutaneous QHS  . insulin aspart  0-9 Units Subcutaneous TID WC  . insulin detemir  6 Units Subcutaneous Daily  . LORazepam  0.5 mg Oral QHS  . losartan  25 mg Oral Daily  . metoprolol succinate  50 mg Oral q morning - 10a  . mometasone  1 application Topical Daily  . multivitamin with minerals  1 tablet Oral Daily  . ondansetron  4 mg Oral Q6H  . pantoprazole  40 mg Oral Daily  . potassium chloride SA  20 mEq Oral BID  . prochlorperazine  10 mg Oral  Daily  . sodium chloride  3 mL Intravenous Q12H  . traMADol  50 mg Oral BID   Continuous Infusions:   Principal  Problem:   Sepsis Active Problems:   HTN (hypertension)   Anemia in neoplastic disease   MDS (myelodysplastic syndrome) with 5q deletion   Dehydration   CKD (chronic kidney disease) stage 3, GFR 30-59 ml/min   Diabetes mellitus with renal complications   UTI (lower urinary tract infection)   Leukocytosis   Thrombocytopenia   Hypokalemia   Acute respiratory failure with hypoxia   CAP (community acquired pneumonia)   Acute on chronic renal failure   Systolic and diastolic CHF, acute   Intractable vomiting with nausea    Time spent: 62 minutes    Marcques Wrightsman M.D. [facility] Pager [phone]. If 7PM-7AM, please contact night-coverage at [email or web address], password [facility] 10/05/2014, 1:14 PM  LOS: 7 days

## 2014-10-08 ENCOUNTER — Other Ambulatory Visit (HOSPITAL_BASED_OUTPATIENT_CLINIC_OR_DEPARTMENT_OTHER): Payer: Medicare Other

## 2014-10-08 ENCOUNTER — Ambulatory Visit (HOSPITAL_BASED_OUTPATIENT_CLINIC_OR_DEPARTMENT_OTHER): Payer: Medicare Other | Admitting: Hematology and Oncology

## 2014-10-08 ENCOUNTER — Telehealth: Payer: Self-pay | Admitting: Hematology and Oncology

## 2014-10-08 VITALS — BP 146/45 | HR 84 | Temp 97.5°F | Resp 17 | Ht 60.0 in | Wt 119.9 lb

## 2014-10-08 DIAGNOSIS — N390 Urinary tract infection, site not specified: Secondary | ICD-10-CM

## 2014-10-08 DIAGNOSIS — D46C Myelodysplastic syndrome with isolated del(5q) chromosomal abnormality: Secondary | ICD-10-CM

## 2014-10-08 DIAGNOSIS — L409 Psoriasis, unspecified: Secondary | ICD-10-CM

## 2014-10-08 DIAGNOSIS — D630 Anemia in neoplastic disease: Secondary | ICD-10-CM

## 2014-10-08 LAB — CBC WITH DIFFERENTIAL/PLATELET
BASO%: 1.4 % (ref 0.0–2.0)
Basophils Absolute: 0.1 10*3/uL (ref 0.0–0.1)
EOS ABS: 1.1 10*3/uL — AB (ref 0.0–0.5)
EOS%: 24.3 % — ABNORMAL HIGH (ref 0.0–7.0)
HCT: 27.8 % — ABNORMAL LOW (ref 34.8–46.6)
HGB: 9 g/dL — ABNORMAL LOW (ref 11.6–15.9)
LYMPH#: 0.5 10*3/uL — AB (ref 0.9–3.3)
LYMPH%: 12.1 % — AB (ref 14.0–49.7)
MCH: 26.6 pg (ref 25.1–34.0)
MCHC: 32.4 g/dL (ref 31.5–36.0)
MCV: 82.1 fL (ref 79.5–101.0)
MONO#: 0.2 10*3/uL (ref 0.1–0.9)
MONO%: 3.7 % (ref 0.0–14.0)
NEUT%: 58.5 % (ref 38.4–76.8)
NEUTROS ABS: 2.6 10*3/uL (ref 1.5–6.5)
Platelets: 146 10*3/uL (ref 145–400)
RBC: 3.39 10*6/uL — AB (ref 3.70–5.45)
RDW: 14.9 % — AB (ref 11.2–14.5)
WBC: 4.4 10*3/uL (ref 3.9–10.3)

## 2014-10-08 LAB — HOLD TUBE, BLOOD BANK

## 2014-10-08 LAB — TECHNOLOGIST REVIEW

## 2014-10-08 NOTE — Patient Instructions (Signed)
Thalidomide capsules What is this medicine? THALIDOMIDE (tha LI doe mide) is used to treat multiple myeloma. It is also used to treat moderate to severe new lesions of leprosy and to prevent and keep the skin lesions of leprosy from coming back. This medicine may be used for other purposes; ask your health care provider or pharmacist if you have questions. COMMON BRAND NAME(S): Thalomid What should I tell my health care provider before I take this medicine? They need to know if you have any of these conditions: -human immunodeficiency virus (HIV) -low blood pressure -low white blood cell count -seizure disorder -tingling or numbness in hands or feet or other nerve pain -an unusual or allergic reaction to thalidomide other medicines, foods, dyes, or preservatives -pregnant or trying to get pregnant -breast-feeding or planning to breast-feed How should I use this medicine? Take this medicine by mouth with a glass of water. Follow the directions on your prescription label. Do not cut, crush or chew this medicine. If you are only taking this medicine once a day, take your dose at bedtime at least 1 hour after your evening meal to decrease the drowsiness effects. Do not take it more often than directed. Do not stop taking except on your doctor's advice. A special MedGuide will be given to you before each treatment. Be sure to read this information carefully each time. Talk to your pediatrician regarding the use of this medicine in children. While this drug may be prescribed for children as young as 12 years for selected conditions, precautions do apply. Overdosage: If you think you have taken too much of this medicine contact a poison control center or emergency room at once. NOTE: This medicine is only for you. Do not share this medicine with others. What if I miss a dose? If you miss a dose, take it as soon as you can. If your next dose is to be taken in less than 12 hours, then do not take the  missed dose. Take the next dose at your regular time. Do not take double or extra doses. What may interact with this medicine? -alcohol or any product that contains alcohol -barbiturates, like phenobarbital -certain antidepressants or tranquilizers -certain antihistamines used in cold medicines -medicines that may decrease the effectiveness of birth control pills -medicines which may cause tingling, numbness or nerve pain -muscle relaxants This list may not describe all possible interactions. Give your health care provider a list of all the medicines, herbs, non-prescription drugs, or dietary supplements you use. Also tell them if you smoke, drink alcohol, or use illegal drugs. Some items may interact with your medicine. What should I watch for while using this medicine? This medicine is available only through a special program. Doctors, pharmacies, and patients must meet all of the conditions of the program. Your health care provider will help you get signed up with the program if you need this medicine. Through the program you will only receive up to a 28 day supply of the medicine at one time. You will need a new prescription for each refill. This medicine causes severe birth defects or death to an unborn child. This can happen after just ONE capsule. Both men and women must agree to take steps to prevent exposure of this medicine to an unborn child. Females with child-bearing potential will need to have 2 negative pregnancy tests before starting this medicine. Pregnancy testing must be done every 2 to 4 weeks as directed while taking this medicine. Use 2 reliable forms  of birth control together while you are taking this medicine and for 1 month after you stop taking this medicine. If you think that you might be pregnant talk to your doctor right away. Men must use a latex condom during sexual contact with a woman while taking this medicine and for 28 days after you stop taking this medicine. A latex  condom is needed even if you have had a vasectomy. Contact your doctor right away if your partner becomes pregnant. Do not donate sperm while taking this medicine and for 28 days after you stop taking this medicine. Do not give blood while taking the medicine and for 1 month after completion of treatment to avoid exposing pregnant women to the medicine through the donated blood. You may get drowsy or dizzy. Do not drive, use machinery, or do anything that needs mental alertness until you know how this medicine affects you. Do not stand or sit up quickly, especially if you are an older patient. This reduces the risk of dizzy or fainting spells. Alcohol may interfere with the effect of this medicine. You may need blood work done while you are taking this medicine. What side effects may I notice from receiving this medicine? Side effects that you should report to your doctor or health care professional as soon as possible: -allergic reactions like skin rash, itching or hives, swelling of the face, lips, or tongue -muscle cramps -new or increased tingling or numbness in hands or feet -seizures -unusual swelling or pain in arms or legs Side effects that usually do not require medical attention (report to your doctor or health care professional if they continue or are bothersome): -constipation or diarrhea -drowsiness -dizziness -headache This list may not describe all possible side effects. Call your doctor for medical advice about side effects. You may report side effects to FDA at 1-800-FDA-1088. Where should I keep my medicine? Keep out of reach of children. Return any unused portion of this medicine to the pharmacy where your prescription was filled. Your pharmacy will accept all unused medicine as part of the controlled distribution program. Store at room temperature between 59 and 86 degrees F (15 and 30 degrees C). Protect from light. Do not use any expired medicine. NOTE: This sheet is a  summary. It may not cover all possible information. If you have questions about this medicine, talk to your doctor, pharmacist, or health care provider.  2015, [employer]. (2011-11-24 10:48:23)

## 2014-10-08 NOTE — Telephone Encounter (Signed)
gv and printedj appt scheda nd avs for pt for Jan and Feb....sed added tx.

## 2014-10-09 ENCOUNTER — Encounter: Payer: Self-pay | Admitting: Hematology and Oncology

## 2014-10-09 NOTE — Progress Notes (Signed)
[place] OFFICE PROGRESS NOTE  Patient Care Team: Mayra Neer, MD as PCP - General (Family Medicine) Heath Lark, MD as Consulting Physician (Hematology and Oncology)  SUMMARY OF ONCOLOGIC HISTORY:  This is a very pleasant 81 year old lady who become transfusion dependent since 2014. She denies any bleeding such as epistaxis, hematuria, or hematochezia. 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 Erythropoietin stimulating agents were stopped when her erythropoietin level came back over 500 In October 2014, she was started on Revlimid however develop severe allergic reaction to Revlimid. That was discontinued. On 08/07/2013, we started her on Vidaza for 2 cycles. On 10/04/2013, repeat a bone marrow aspirate and biopsy showed persistent disease. Additional testing revealed that her disease is JAK 2 positive On 11/06/2013, Vidaza is resumed after her second opinion from [facility] On 02/08/2014, repeat bone marrow aspirate and biopsy showed no evidence of increased blasts On 04/16/2014, Vidaza is discontinued due to increased transfusion requirement and progressive weakness On 04/30/14: she was started on 100 mg danazol On 05/21/14: Danazol is increased to 200 mg daily. On 06/18/2014, danazol was discontinued. On 06/25/14: Shanon Brow is added On 07/16/2014, Shanon Brow is on hold due to severe pancytopenia and uncontrolled nausea and vomiting From 09/28/2014 to 10/05/2014, the patient was admitted to the hospital due to sepsis, bacteremia and UTI   INTERVAL HISTORY: Please see below for problem oriented charting. She is seen as part our hospital follow-up. Overall, she is slowly improving. Appetite stable. Denies recent fevers or chills. No dysuria, frequency or urgency. She complained of fatigue. She has recent flare of psoriasis.  REVIEW OF SYSTEMS:   Constitutional: Denies  fevers, chills or abnormal weight loss Eyes: Denies blurriness of vision Ears, nose, mouth, throat, and face: Denies mucositis or sore throat Respiratory: Denies cough, dyspnea or wheezes Cardiovascular: Denies palpitation, chest discomfort or lower extremity swelling Gastrointestinal:  Denies nausea, heartburn or change in bowel habits Lymphatics: Denies new lymphadenopathy or easy bruising Neurological:Denies numbness, tingling or new weaknesses Behavioral/Psych: Mood is stable, no new changes  All other systems were reviewed with the patient and are negative.  I have reviewed the past medical history, past surgical history, social history and family history with the patient and they are unchanged from previous note.  ALLERGIES:  is allergic to novocain; oxycodone; codeine; methimazole; and revlimid.  MEDICATIONS:  Current Outpatient Prescriptions  Medication Sig Dispense Refill  . acetaminophen (TYLENOL) 500 MG tablet Take 1,000 mg by mouth every 6 (six) hours as needed for fever (fever).     . ciprofloxacin (CIPRO) 500 MG tablet Take 1 tablet (500 mg total) by mouth 2 (two) times daily. Take for 9 days then stop. 18 tablet 0  . desoximetasone (TOPICORT) 0.25 % cream Apply 1 application topically 2 (two) times daily as needed. For rash    . furosemide (LASIX) 40 MG tablet Take 1 tablet (40 mg total) by mouth daily. 30 tablet 0  . HYDROcodone-acetaminophen (NORCO/VICODIN) 5-325 MG per tablet Take 1 tablet by mouth every 4 (four) hours as needed for moderate pain. 20 tablet 0  . hydrOXYzine (VISTARIL) 25 MG capsule Take 1 capsule (25 mg total) by mouth every 4 (four) hours as needed. 90 capsule 3  . insulin aspart (NOVOLOG) 100 UNIT/ML injection Inject 15 Units into the skin 3 (three) times daily before meals. For Blood sugar over 200    . insulin detemir (LEVEMIR)  100 UNIT/ML injection Inject 12 Units into the skin daily.     Marland Kitchen LORazepam (ATIVAN) 0.5 MG tablet Take 0.5 mg by mouth at  bedtime.     Marland Kitchen losartan (COZAAR) 25 MG tablet Take 1 tablet (25 mg total) by mouth daily. 30 tablet 0  . metFORMIN (GLUCOPHAGE) 500 MG tablet Take 1 tablet (500 mg total) by mouth 2 (two) times daily with a meal. 60 tablet 1  . metoprolol succinate (TOPROL-XL) 100 MG 24 hr tablet Take 50 mg by mouth every morning.     . mometasone (ELOCON) 0.1 % cream Apply 1 application topically daily as needed. Rash    . Multiple Vitamin (MULTIVITAMIN) tablet Take 1 tablet by mouth every morning.     Marland Kitchen omeprazole (PRILOSEC) 40 MG capsule Take 1 capsule (40 mg total) by mouth daily. 90 capsule 3  . ondansetron (ZOFRAN) 4 MG tablet Take 1 tablet (4 mg total) by mouth every 6 (six) hours. 30 tablet 0  . ondansetron (ZOFRAN) 8 MG tablet Take 1 tablet (8 mg total) by mouth 2 (two) times daily as needed for nausea or vomiting. 90 tablet 3  . potassium chloride SA (K-DUR,KLOR-CON) 20 MEQ tablet Take 1 tablet (20 mEq total) by mouth daily. 30 tablet 0  . predniSONE (DELTASONE) 10 MG tablet Take 1 tablet (10 mg total) by mouth daily with breakfast. 30 tablet 0  . prochlorperazine (COMPAZINE) 10 MG tablet Take 10 mg by mouth daily.     . ranitidine (ZANTAC) 75 MG tablet Take 1 tablet (75 mg total) by mouth at bedtime. 30 tablet 6  . scopolamine (TRANSDERM-SCOP) 1 MG/3DAYS Place 1 patch (1.5 mg total) onto the skin every 3 (three) days. 10 patch 12  . traMADol (ULTRAM) 50 MG tablet Take 50 mg by mouth 2 (two) times daily.     Marland Kitchen triamcinolone cream (KENALOG) 0.1 %      No current facility-administered medications for this visit.   Facility-Administered Medications Ordered in Other Visits  Medication Dose Route Frequency Provider Last Rate Last Dose  . sodium chloride 0.9 % injection 3 mL  3 mL Intracatheter PRN Heath Lark, MD   3 mL at 10/25/13 1407    PHYSICAL EXAMINATION: ECOG PERFORMANCE STATUS: 1 - Symptomatic but completely ambulatory  Filed Vitals:   10/08/14 0958  BP: 146/45  Pulse: 84  Temp: 97.5 F  (36.4 C)  Resp: 17   Filed Weights   10/08/14 0958  Weight: 119 lb 14.4 oz (54.386 kg)    GENERAL:alert, no distress and comfortable SKIN: skin color is pale, texture, turgor are normal, no rashes or significant lesions. She has psoriatic plaque EYES: normal, Conjunctiva are pink and non-injected, sclera clear OROPHARYNX:no exudate, no erythema and lips, buccal mucosa, and tongue normal  Musculoskeletal:no cyanosis of digits and no clubbing  NEURO: alert & oriented x 3 with fluent speech, no focal motor/sensory deficits  LABORATORY DATA:  I have reviewed the data as listed    Component Value Date/Time   NA 135 10/05/2014 0610   NA 142 07/23/2014 0920   K 3.9 10/05/2014 0610   K 3.5 07/23/2014 0920   CL 107 10/05/2014 0610   CO2 23 10/05/2014 0610   CO2 27 07/23/2014 0920   GLUCOSE 106* 10/05/2014 0610   GLUCOSE 131 07/23/2014 0920   BUN 30* 10/05/2014 0610   BUN 23.6 07/23/2014 0920   CREATININE 0.79 10/05/2014 0610   CREATININE 1.0 07/23/2014 0920   CALCIUM 8.3* 10/05/2014 7591  CALCIUM 9.3 07/23/2014 0920   PROT 6.5 10/04/2014 0825   PROT 6.0* 07/23/2014 0920   ALBUMIN 3.2* 10/04/2014 0825   ALBUMIN 3.2* 07/23/2014 0920   AST 16 10/04/2014 0825   AST 20 07/23/2014 0920   ALT 37* 10/04/2014 0825   ALT 47 07/23/2014 0920   ALKPHOS 98 10/04/2014 0825   ALKPHOS 42 07/23/2014 0920   BILITOT 1.0 10/04/2014 0825   BILITOT 1.34* 07/23/2014 0920   GFRNONAA 73* 10/05/2014 0610   GFRAA 84* 10/05/2014 0610    No results found for: SPEP, UPEP  Lab Results  Component Value Date   WBC 4.4 10/08/2014   NEUTROABS 2.6 10/08/2014   HGB 9.0* 10/08/2014   HCT 27.8* 10/08/2014   MCV 82.1 10/08/2014   PLT 146 10/08/2014      Chemistry      Component Value Date/Time   NA 135 10/05/2014 0610   NA 142 07/23/2014 0920   K 3.9 10/05/2014 0610   K 3.5 07/23/2014 0920   CL 107 10/05/2014 0610   CO2 23 10/05/2014 0610   CO2 27 07/23/2014 0920   BUN 30* 10/05/2014 0610    BUN 23.6 07/23/2014 0920   CREATININE 0.79 10/05/2014 0610   CREATININE 1.0 07/23/2014 0920   GLU 199* 04/23/2014 0910      Component Value Date/Time   CALCIUM 8.3* 10/05/2014 0610   CALCIUM 9.3 07/23/2014 0920   ALKPHOS 98 10/04/2014 0825   ALKPHOS 42 07/23/2014 0920   AST 16 10/04/2014 0825   AST 20 07/23/2014 0920   ALT 37* 10/04/2014 0825   ALT 47 07/23/2014 0920   BILITOT 1.0 10/04/2014 0825   BILITOT 1.34* 07/23/2014 0920       RADIOGRAPHIC STUDIES: I reviewed the most recent CT scan with her and family I have personally reviewed the radiological images as listed and agreed with the findings in the report.  ASSESSMENT & PLAN:  MDS (myelodysplastic syndrome) with 5q deletion Overall, she tolerated treatment very poorly. I recommend she continue to hold chemotherapy and continue supportive care only. She will also continue on prednisone daily. I plan to give a trial of thalidomide to control her anemia. However, I do not plan to start until the end of the month, after she recovers from recent hospitalization and infection  Anemia in neoplastic disease This is likely due to recent treatment. The patient denies recent history of bleeding such as epistaxis, hematuria or hematochezia. She is asymptomatic from the anemia. I will observe for now.  She does not require transfusion now.  Repeat serum erythropoietin is still very high, confirming significant bone marrow failure The plan would be to give her 2 units of blood whenever her hemoglobin dropped to less than 8 g I will plan to give a trial of thalidomide in the future due to recent data that it may help.     Psoriasis This has flared up since recent prednisone taper. She is currently on 12.5 mg a prednisone taper. After this week, I recommend she try to go down to 10 mg if possible.  UTI (lower urinary tract infection) She is receiving appropriate treatment. Her symptoms are resolving. She will continue antibiotic  therapy as prescribed.   No orders of the defined types were placed in this encounter.   All questions were answered. The patient knows to call the clinic with any problems, questions or concerns. No barriers to learning was detected. I spent 25 minutes counseling the patient face to face. The total  time spent in the appointment was 30 minutes and more than 50% was on counseling and review of test results     [facility], [place], MD 10/09/2014 4:04 PM

## 2014-10-09 NOTE — Assessment & Plan Note (Signed)
This has flared up since recent prednisone taper. She is currently on 12.5 mg a prednisone taper. After this week, I recommend she try to go down to 10 mg if possible.

## 2014-10-09 NOTE — Assessment & Plan Note (Signed)
This is likely due to recent treatment. The patient denies recent history of bleeding such as epistaxis, hematuria or hematochezia. She is asymptomatic from the anemia. I will observe for now.  She does not require transfusion now.  Repeat serum erythropoietin is still very high, confirming significant bone marrow failure The plan would be to give her 2 units of blood whenever her hemoglobin dropped to less than 8 g I will plan to give a trial of thalidomide in the future due to recent data that it may help.

## 2014-10-09 NOTE — Assessment & Plan Note (Signed)
She is receiving appropriate treatment. Her symptoms are resolving. She will continue antibiotic therapy as prescribed.

## 2014-10-09 NOTE — Assessment & Plan Note (Addendum)
Overall, she tolerated treatment very poorly. I recommend she continue to hold chemotherapy and continue supportive care only. She will also continue on prednisone daily. I plan to give a trial of thalidomide to control her anemia. However, I do not plan to start until the end of the month, after she recovers from recent hospitalization and infection

## 2014-10-11 ENCOUNTER — Other Ambulatory Visit: Payer: Self-pay | Admitting: Hematology and Oncology

## 2014-10-12 ENCOUNTER — Telehealth: Payer: Self-pay | Admitting: *Deleted

## 2014-10-12 ENCOUNTER — Other Ambulatory Visit: Payer: Self-pay | Admitting: Hematology and Oncology

## 2014-10-12 DIAGNOSIS — D46C Myelodysplastic syndrome with isolated del(5q) chromosomal abnormality: Secondary | ICD-10-CM

## 2014-10-12 MED ORDER — HYDROCODONE-ACETAMINOPHEN 5-325 MG PO TABS: 1.0000 | ORAL_TABLET | ORAL | Status: DC | PRN

## 2014-10-12 NOTE — Telephone Encounter (Signed)
Informed daughter of rx ready to pick up.  She verbalized understanding.

## 2014-10-12 NOTE — Telephone Encounter (Signed)
Pt's daughter states pt taking hydrocodone about three times daily for left side pain since hospital d/c.   She requests refill for pt as she has ongoing pain and almost out of hydrocodone.

## 2014-10-15 ENCOUNTER — Other Ambulatory Visit (HOSPITAL_BASED_OUTPATIENT_CLINIC_OR_DEPARTMENT_OTHER): Payer: Medicare Other

## 2014-10-15 DIAGNOSIS — D46C Myelodysplastic syndrome with isolated del(5q) chromosomal abnormality: Secondary | ICD-10-CM

## 2014-10-15 LAB — CBC WITH DIFFERENTIAL/PLATELET
BASO%: 3.1 % — ABNORMAL HIGH (ref 0.0–2.0)
Basophils Absolute: 0.2 10*3/uL — ABNORMAL HIGH (ref 0.0–0.1)
EOS ABS: 0.3 10*3/uL (ref 0.0–0.5)
EOS%: 5.5 % (ref 0.0–7.0)
HCT: 26.8 % — ABNORMAL LOW (ref 34.8–46.6)
HGB: 8.7 g/dL — ABNORMAL LOW (ref 11.6–15.9)
LYMPH#: 0.7 10*3/uL — AB (ref 0.9–3.3)
LYMPH%: 11.5 % — ABNORMAL LOW (ref 14.0–49.7)
MCH: 26.3 pg (ref 25.1–34.0)
MCHC: 32.5 g/dL (ref 31.5–36.0)
MCV: 81 fL (ref 79.5–101.0)
MONO#: 0.4 10*3/uL (ref 0.1–0.9)
MONO%: 6.2 % (ref 0.0–14.0)
NEUT%: 73.7 % (ref 38.4–76.8)
NEUTROS ABS: 4.5 10*3/uL (ref 1.5–6.5)
PLATELETS: 107 10*3/uL — AB (ref 145–400)
RBC: 3.31 10*6/uL — AB (ref 3.70–5.45)
RDW: 14.6 % — ABNORMAL HIGH (ref 11.2–14.5)
WBC: 6.2 10*3/uL (ref 3.9–10.3)
nRBC: 0 % (ref 0–0)

## 2014-10-15 LAB — HOLD TUBE, BLOOD BANK

## 2014-10-22 ENCOUNTER — Ambulatory Visit (HOSPITAL_COMMUNITY)
Admission: RE | Admit: 2014-10-22 | Discharge: 2014-10-22 | Disposition: A | Discharge status: Home / Self Care | Payer: Medicare Other | Source: Ambulatory Visit | Attending: Hematology and Oncology | Admitting: Hematology and Oncology

## 2014-10-22 ENCOUNTER — Ambulatory Visit (HOSPITAL_BASED_OUTPATIENT_CLINIC_OR_DEPARTMENT_OTHER): Payer: Medicare Other

## 2014-10-22 ENCOUNTER — Ambulatory Visit (HOSPITAL_BASED_OUTPATIENT_CLINIC_OR_DEPARTMENT_OTHER): Payer: Medicare Other | Admitting: Hematology and Oncology

## 2014-10-22 ENCOUNTER — Other Ambulatory Visit: Payer: Self-pay | Admitting: Hematology and Oncology

## 2014-10-22 VITALS — BP 135/43 | HR 70 | Temp 98.0°F | Resp 18

## 2014-10-22 DIAGNOSIS — D46C Myelodysplastic syndrome with isolated del(5q) chromosomal abnormality: Secondary | ICD-10-CM | POA: Insufficient documentation

## 2014-10-22 DIAGNOSIS — D630 Anemia in neoplastic disease: Secondary | ICD-10-CM | POA: Insufficient documentation

## 2014-10-22 LAB — CBC WITH DIFFERENTIAL/PLATELET
BASO%: 3.7 % — ABNORMAL HIGH (ref 0.0–2.0)
BASOS ABS: 0.2 10*3/uL — AB (ref 0.0–0.1)
EOS ABS: 0.6 10*3/uL — AB (ref 0.0–0.5)
EOS%: 13.3 % — ABNORMAL HIGH (ref 0.0–7.0)
HCT: 21.9 % — ABNORMAL LOW (ref 34.8–46.6)
HEMOGLOBIN: 7.1 g/dL — AB (ref 11.6–15.9)
LYMPH#: 0.8 10*3/uL — AB (ref 0.9–3.3)
LYMPH%: 16.2 % (ref 14.0–49.7)
MCH: 25.6 pg (ref 25.1–34.0)
MCHC: 32.4 g/dL (ref 31.5–36.0)
MCV: 79.1 fL — ABNORMAL LOW (ref 79.5–101.0)
MONO#: 0.4 10*3/uL (ref 0.1–0.9)
MONO%: 7.3 % (ref 0.0–14.0)
NEUT%: 59.5 % (ref 38.4–76.8)
NEUTROS ABS: 2.9 10*3/uL (ref 1.5–6.5)
Platelets: 126 10*3/uL — ABNORMAL LOW (ref 145–400)
RBC: 2.77 10*6/uL — ABNORMAL LOW (ref 3.70–5.45)
RDW: 14.5 % (ref 11.2–14.5)
WBC: 4.8 10*3/uL (ref 3.9–10.3)
nRBC: 0 % (ref 0–0)

## 2014-10-22 LAB — HOLD TUBE, BLOOD BANK

## 2014-10-22 LAB — PREPARE RBC (CROSSMATCH)

## 2014-10-22 MED ORDER — ACETAMINOPHEN 325 MG PO TABS
ORAL_TABLET | ORAL | Status: AC
  Filled 2014-10-22: qty 2

## 2014-10-22 MED ORDER — DIPHENHYDRAMINE HCL 25 MG PO CAPS
ORAL_CAPSULE | ORAL | Status: AC
  Filled 2014-10-22: qty 1

## 2014-10-22 MED ORDER — SODIUM CHLORIDE 0.9 % IV SOLN
250.0000 mL | Freq: Once | INTRAVENOUS | Status: AC
  Administered 2014-10-22: 250 mL via INTRAVENOUS

## 2014-10-22 MED ORDER — SODIUM CHLORIDE 0.9 % IJ SOLN
10.0000 mL | INTRAMUSCULAR | Status: AC | PRN
  Administered 2014-10-22: 10 mL
  Filled 2014-10-22: qty 10

## 2014-10-22 MED ORDER — ACETAMINOPHEN 325 MG PO TABS
650.0000 mg | ORAL_TABLET | Freq: Once | ORAL | Status: AC
  Administered 2014-10-22: 650 mg via ORAL

## 2014-10-22 MED ORDER — DIPHENHYDRAMINE HCL 25 MG PO CAPS
25.0000 mg | ORAL_CAPSULE | Freq: Once | ORAL | Status: AC
Start: 2014-10-22 — End: 2014-10-22
  Administered 2014-10-22: 25 mg via ORAL

## 2014-10-22 MED ORDER — HEPARIN SOD (PORK) LOCK FLUSH 100 UNIT/ML IV SOLN
500.0000 [IU] | Freq: Every day | INTRAVENOUS | Status: AC | PRN
  Administered 2014-10-22: 500 [IU]
  Filled 2014-10-22: qty 5

## 2014-10-22 MED ORDER — FUROSEMIDE 10 MG/ML IJ SOLN
20.0000 mg | Freq: Once | INTRAMUSCULAR | Status: AC
  Administered 2014-10-22: 20 mg via INTRAVENOUS

## 2014-10-22 NOTE — Patient Instructions (Signed)

## 2014-10-23 LAB — TYPE AND SCREEN
ABO/RH(D): A POS
Antibody Screen: NEGATIVE
Unit division: 0
Unit division: 0

## 2014-10-29 ENCOUNTER — Telehealth: Payer: Self-pay | Admitting: *Deleted

## 2014-10-29 ENCOUNTER — Other Ambulatory Visit (HOSPITAL_BASED_OUTPATIENT_CLINIC_OR_DEPARTMENT_OTHER): Payer: Medicare Other

## 2014-10-29 ENCOUNTER — Encounter: Payer: Self-pay | Admitting: Hematology and Oncology

## 2014-10-29 ENCOUNTER — Encounter: Payer: Self-pay | Admitting: *Deleted

## 2014-10-29 ENCOUNTER — Telehealth: Payer: Self-pay | Admitting: Hematology and Oncology

## 2014-10-29 ENCOUNTER — Ambulatory Visit (HOSPITAL_BASED_OUTPATIENT_CLINIC_OR_DEPARTMENT_OTHER): Payer: Medicare Other | Admitting: Hematology and Oncology

## 2014-10-29 VITALS — BP 142/47 | HR 82 | Temp 97.8°F | Resp 18 | Ht 60.0 in | Wt 118.8 lb

## 2014-10-29 DIAGNOSIS — D696 Thrombocytopenia, unspecified: Secondary | ICD-10-CM

## 2014-10-29 DIAGNOSIS — D46C Myelodysplastic syndrome with isolated del(5q) chromosomal abnormality: Secondary | ICD-10-CM

## 2014-10-29 DIAGNOSIS — D630 Anemia in neoplastic disease: Secondary | ICD-10-CM

## 2014-10-29 DIAGNOSIS — L409 Psoriasis, unspecified: Secondary | ICD-10-CM

## 2014-10-29 LAB — CBC WITH DIFFERENTIAL/PLATELET
BASO%: 2.9 % — ABNORMAL HIGH (ref 0.0–2.0)
Basophils Absolute: 0.1 10*3/uL (ref 0.0–0.1)
EOS ABS: 0.9 10*3/uL — AB (ref 0.0–0.5)
EOS%: 23 % — AB (ref 0.0–7.0)
HCT: 29.2 % — ABNORMAL LOW (ref 34.8–46.6)
HGB: 9.7 g/dL — ABNORMAL LOW (ref 11.6–15.9)
LYMPH#: 0.7 10*3/uL — AB (ref 0.9–3.3)
LYMPH%: 19.3 % (ref 14.0–49.7)
MCH: 27.5 pg (ref 25.1–34.0)
MCHC: 33.2 g/dL (ref 31.5–36.0)
MCV: 82.7 fL (ref 79.5–101.0)
MONO#: 0.2 10*3/uL (ref 0.1–0.9)
MONO%: 4.2 % (ref 0.0–14.0)
NEUT%: 50.6 % (ref 38.4–76.8)
NEUTROS ABS: 1.9 10*3/uL (ref 1.5–6.5)
PLATELETS: 94 10*3/uL — AB (ref 145–400)
RBC: 3.53 10*6/uL — AB (ref 3.70–5.45)
RDW: 15.2 % — ABNORMAL HIGH (ref 11.2–14.5)
WBC: 3.8 10*3/uL — ABNORMAL LOW (ref 3.9–10.3)
nRBC: 0 % (ref 0–0)

## 2014-10-29 LAB — HOLD TUBE, BLOOD BANK

## 2014-10-29 MED ORDER — THALIDOMIDE 50 MG PO CAPS: 50.0000 mg | ORAL_CAPSULE | Freq: Every day | ORAL | Status: DC

## 2014-10-29 NOTE — Assessment & Plan Note (Signed)
This has flared up since recent prednisone taper. She is currently on 12.5 mg a prednisone taper.

## 2014-10-29 NOTE — Progress Notes (Signed)
Pt enrolled in [employer] [facility] for Thalomid.  Auth# [id number].  Rx given to [facility] in managed care dept.Marland Kitchen

## 2014-10-29 NOTE — Telephone Encounter (Signed)
Gave avs & calendar for February. Sent message to schedule blood transfusion.

## 2014-10-29 NOTE — Assessment & Plan Note (Signed)
This has improved since we hold her chemotherapy. Recommend continue observation.

## 2014-10-29 NOTE — Progress Notes (Signed)
Faxed thalomid prescription to [facility]

## 2014-10-29 NOTE — Assessment & Plan Note (Signed)
This is likely due to recent treatment. The patient denies recent history of bleeding such as epistaxis, hematuria or hematochezia. She is asymptomatic from the anemia. I will observe for now.  She does not require transfusion now.  Repeat serum erythropoietin is still very high, confirming significant bone marrow failure The plan would be to give her 2 units of blood whenever her hemoglobin dropped to less than 8 g I will plan to give a trial of thalidomide in the future due to recent data that it may help.

## 2014-10-29 NOTE — Patient Instructions (Signed)
Thalidomide capsules What is this medicine? THALIDOMIDE (tha LI doe mide) is used to treat multiple myeloma. It is also used to treat moderate to severe new lesions of leprosy and to prevent and keep the skin lesions of leprosy from coming back. This medicine may be used for other purposes; ask your health care provider or pharmacist if you have questions. COMMON BRAND NAME(S): Thalomid What should I tell my health care provider before I take this medicine? They need to know if you have any of these conditions: -human immunodeficiency virus (HIV) -low blood pressure -low white blood cell count -seizure disorder -tingling or numbness in hands or feet or other nerve pain -an unusual or allergic reaction to thalidomide other medicines, foods, dyes, or preservatives -pregnant or trying to get pregnant -breast-feeding or planning to breast-feed How should I use this medicine? Take this medicine by mouth with a glass of water. Follow the directions on your prescription label. Do not cut, crush or chew this medicine. If you are only taking this medicine once a day, take your dose at bedtime at least 1 hour after your evening meal to decrease the drowsiness effects. Do not take it more often than directed. Do not stop taking except on your doctor's advice. A special MedGuide will be given to you before each treatment. Be sure to read this information carefully each time. Talk to your pediatrician regarding the use of this medicine in children. While this drug may be prescribed for children as young as 12 years for selected conditions, precautions do apply. Overdosage: If you think you have taken too much of this medicine contact a poison control center or emergency room at once. NOTE: This medicine is only for you. Do not share this medicine with others. What if I miss a dose? If you miss a dose, take it as soon as you can. If your next dose is to be taken in less than 12 hours, then do not take the  missed dose. Take the next dose at your regular time. Do not take double or extra doses. What may interact with this medicine? -alcohol or any product that contains alcohol -barbiturates, like phenobarbital -certain antidepressants or tranquilizers -certain antihistamines used in cold medicines -medicines that may decrease the effectiveness of birth control pills -medicines which may cause tingling, numbness or nerve pain -muscle relaxants This list may not describe all possible interactions. Give your health care provider a list of all the medicines, herbs, non-prescription drugs, or dietary supplements you use. Also tell them if you smoke, drink alcohol, or use illegal drugs. Some items may interact with your medicine. What should I watch for while using this medicine? This medicine is available only through a special program. Doctors, pharmacies, and patients must meet all of the conditions of the program. Your health care provider will help you get signed up with the program if you need this medicine. Through the program you will only receive up to a 28 day supply of the medicine at one time. You will need a new prescription for each refill. This medicine causes severe birth defects or death to an unborn child. This can happen after just ONE capsule. Both men and women must agree to take steps to prevent exposure of this medicine to an unborn child. Females with child-bearing potential will need to have 2 negative pregnancy tests before starting this medicine. Pregnancy testing must be done every 2 to 4 weeks as directed while taking this medicine. Use 2 reliable forms  of birth control together while you are taking this medicine and for 1 month after you stop taking this medicine. If you think that you might be pregnant talk to your doctor right away. Men must use a latex condom during sexual contact with a woman while taking this medicine and for 28 days after you stop taking this medicine. A latex  condom is needed even if you have had a vasectomy. Contact your doctor right away if your partner becomes pregnant. Do not donate sperm while taking this medicine and for 28 days after you stop taking this medicine. Do not give blood while taking the medicine and for 1 month after completion of treatment to avoid exposing pregnant women to the medicine through the donated blood. You may get drowsy or dizzy. Do not drive, use machinery, or do anything that needs mental alertness until you know how this medicine affects you. Do not stand or sit up quickly, especially if you are an older patient. This reduces the risk of dizzy or fainting spells. Alcohol may interfere with the effect of this medicine. You may need blood work done while you are taking this medicine. What side effects may I notice from receiving this medicine? Side effects that you should report to your doctor or health care professional as soon as possible: -allergic reactions like skin rash, itching or hives, swelling of the face, lips, or tongue -muscle cramps -new or increased tingling or numbness in hands or feet -seizures -unusual swelling or pain in arms or legs Side effects that usually do not require medical attention (report to your doctor or health care professional if they continue or are bothersome): -constipation or diarrhea -drowsiness -dizziness -headache This list may not describe all possible side effects. Call your doctor for medical advice about side effects. You may report side effects to FDA at 1-800-FDA-1088. Where should I keep my medicine? Keep out of reach of children. Return any unused portion of this medicine to the pharmacy where your prescription was filled. Your pharmacy will accept all unused medicine as part of the controlled distribution program. Store at room temperature between 59 and 86 degrees F (15 and 30 degrees C). Protect from light. Do not use any expired medicine. NOTE: This sheet is a  summary. It may not cover all possible information. If you have questions about this medicine, talk to your doctor, pharmacist, or health care provider.  2015, [employer]. (2011-11-24 10:48:23)

## 2014-10-29 NOTE — Assessment & Plan Note (Signed)
Overall, she tolerated treatment very poorly.She will also continue on prednisone daily. I plan to give a trial of thalidomide to control her anemia. We discussed the role of chemotherapy. The intent is for palliative.  We discussed some of the risks, benefits, side-effects of Thalidomide.   Some of the short term side-effects included, though not limited to, risk of fatigue, weight loss, pancytopenia, life-threatening infections, need for transfusions of blood products, nausea, vomiting, change in bowel habits, blood clots, admission to hospital for various reasons, and risks of death.   Long term side-effects are also discussed including risks of infertility, permanent damage to nerve function, chronic fatigue, and rare secondary malignancy including bone marrow disorders such as acute leukemia.   The patient is aware that the response rates discussed earlier is not guaranteed.    After a long discussion, patient made an informed decision to proceed with the prescribed plan of care and went ahead to sign the consent form today.   Patient education material was dispensed

## 2014-10-29 NOTE — Telephone Encounter (Signed)
Per staff message and POF I have scheduled appts. Advised scheduler of appts. JMW  

## 2014-10-29 NOTE — Progress Notes (Signed)
[place] OFFICE PROGRESS NOTE  Patient Care Team: Mayra Neer, MD as PCP - General (Family Medicine) Heath Lark, MD as Consulting Physician (Hematology and Oncology)  SUMMARY OF ONCOLOGIC HISTORY: This is a very pleasant 81 year old lady who become transfusion dependent since 2014. She denies any bleeding such as epistaxis, hematuria, or hematochezia. 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 Erythropoietin stimulating agents were stopped when her erythropoietin level came back over 500 In October 2014, she was started on Revlimid however develop severe allergic reaction to Revlimid. That was discontinued. On 08/07/2013, we started her on Vidaza for 2 cycles. On 10/04/2013, repeat a bone marrow aspirate and biopsy showed persistent disease. Additional testing revealed that her disease is JAK 2 positive On 11/06/2013, Vidaza is resumed after her second opinion from [facility] On 02/08/2014, repeat bone marrow aspirate and biopsy showed no evidence of increased blasts On 04/16/2014, Vidaza is discontinued due to increased transfusion requirement and progressive weakness On 04/30/14: she was started on 100 mg danazol On 05/21/14: Danazol is increased to 200 mg daily. On 06/18/2014, danazol was discontinued. On 06/25/14: Shanon Brow is added On 07/16/2014, Shanon Brow is on hold due to severe pancytopenia and uncontrolled nausea and vomiting From 09/28/2014 to 10/05/2014, the patient was admitted to the hospital due to sepsis, bacteremia and UTI   INTERVAL HISTORY: Please see below for problem oriented charting. She is feeling better. Denies any recent fevers or chills. She complained of fatigue. The patient denies any recent signs or symptoms of bleeding such as spontaneous epistaxis, hematuria or hematochezia. She denies recent flare of psoriasis.  REVIEW OF SYSTEMS:    Constitutional: Denies fevers, chills or abnormal weight loss Eyes: Denies blurriness of vision Ears, nose, mouth, throat, and face: Denies mucositis or sore throat Respiratory: Denies cough, dyspnea or wheezes Cardiovascular: Denies palpitation, chest discomfort or lower extremity swelling Gastrointestinal:  Denies nausea, heartburn or change in bowel habits Lymphatics: Denies new lymphadenopathy or easy bruising Neurological:Denies numbness, tingling or new weaknesses Behavioral/Psych: Mood is stable, no new changes  All other systems were reviewed with the patient and are negative.  I have reviewed the past medical history, past surgical history, social history and family history with the patient and they are unchanged from previous note.  ALLERGIES:  is allergic to novocain; oxycodone; codeine; methimazole; and revlimid.  MEDICATIONS:  Current Outpatient Prescriptions  Medication Sig Dispense Refill  . acetaminophen (TYLENOL) 500 MG tablet Take 1,000 mg by mouth every 6 (six) hours as needed for fever (fever).     Marland Kitchen desoximetasone (TOPICORT) 0.25 % cream Apply 1 application topically 2 (two) times daily as needed. For rash    . furosemide (LASIX) 40 MG tablet Take 1 tablet (40 mg total) by mouth daily. 30 tablet 0  . HYDROcodone-acetaminophen (NORCO/VICODIN) 5-325 MG per tablet Take 1 tablet by mouth every 4 (four) hours as needed for moderate pain. 60 tablet 0  . hydrOXYzine (VISTARIL) 25 MG capsule Take 1 capsule (25 mg total) by mouth every 4 (four) hours as needed. 90 capsule 3  . insulin aspart (NOVOLOG) 100 UNIT/ML injection Inject 15 Units into the skin 3 (three) times daily before meals. For Blood sugar over 200    . insulin detemir (LEVEMIR) 100 UNIT/ML injection Inject 12 Units into the skin daily.     Marland Kitchen LORazepam (ATIVAN) 0.5 MG tablet Take 0.5 mg by mouth at bedtime.     Marland Kitchen  losartan (COZAAR) 25 MG tablet Take 1 tablet (25 mg total) by mouth daily. 30 tablet 0  . metFORMIN  (GLUCOPHAGE) 500 MG tablet Take 1 tablet (500 mg total) by mouth 2 (two) times daily with a meal. 60 tablet 1  . metoprolol succinate (TOPROL-XL) 100 MG 24 hr tablet Take 50 mg by mouth every morning.     . mometasone (ELOCON) 0.1 % cream Apply 1 application topically daily as needed. Rash    . Multiple Vitamin (MULTIVITAMIN) tablet Take 1 tablet by mouth every morning.     Marland Kitchen omeprazole (PRILOSEC) 40 MG capsule Take 1 capsule (40 mg total) by mouth daily. 90 capsule 3  . ondansetron (ZOFRAN) 4 MG tablet Take 1 tablet (4 mg total) by mouth every 6 (six) hours. 30 tablet 0  . ondansetron (ZOFRAN) 8 MG tablet Take 1 tablet (8 mg total) by mouth 2 (two) times daily as needed for nausea or vomiting. 90 tablet 3  . potassium chloride SA (K-DUR,KLOR-CON) 20 MEQ tablet Take 1 tablet (20 mEq total) by mouth daily. 30 tablet 0  . predniSONE (DELTASONE) 10 MG tablet Take 1 tablet (10 mg total) by mouth daily with breakfast. 30 tablet 0  . prochlorperazine (COMPAZINE) 10 MG tablet Take 10 mg by mouth daily.     . ranitidine (ZANTAC) 75 MG tablet Take 1 tablet (75 mg total) by mouth at bedtime. 30 tablet 6  . scopolamine (TRANSDERM-SCOP) 1 MG/3DAYS Place 1 patch (1.5 mg total) onto the skin every 3 (three) days. 10 patch 12  . traMADol (ULTRAM) 50 MG tablet Take 50 mg by mouth 2 (two) times daily.     Marland Kitchen triamcinolone cream (KENALOG) 0.1 %     . thalidomide (THALOMID) 50 MG capsule Take 1 capsule (50 mg total) by mouth at bedtime. Take with water. 30 capsule 0   No current facility-administered medications for this visit.   Facility-Administered Medications Ordered in Other Visits  Medication Dose Route Frequency Provider Last Rate Last Dose  . sodium chloride 0.9 % injection 3 mL  3 mL Intracatheter PRN Heath Lark, MD   3 mL at 10/25/13 1407    PHYSICAL EXAMINATION: ECOG PERFORMANCE STATUS: 1 - Symptomatic but completely ambulatory  Filed Vitals:   10/29/14 1036  BP: 142/47  Pulse: 82  Temp: 97.8 F  (36.6 C)  Resp: 18   Filed Weights   10/29/14 1036  Weight: 118 lb 12.8 oz (53.887 kg)    GENERAL:alert, no distress and comfortable SKIN: skin color, texture, turgor are normal, no rashes or significant lesions. She has skin bruising and persistent psoriatic plaque EYES: normal, Conjunctiva are pink and non-injected, sclera clear OROPHARYNX:no exudate, no erythema and lips, buccal mucosa, and tongue normal  Musculoskeletal:no cyanosis of digits and no clubbing  NEURO: alert & oriented x 3 with fluent speech, no focal motor/sensory deficits  LABORATORY DATA:  I have reviewed the data as listed    Component Value Date/Time   NA 135 10/05/2014 0610   NA 142 07/23/2014 0920   K 3.9 10/05/2014 0610   K 3.5 07/23/2014 0920   CL 107 10/05/2014 0610   CO2 23 10/05/2014 0610   CO2 27 07/23/2014 0920   GLUCOSE 106* 10/05/2014 0610   GLUCOSE 131 07/23/2014 0920   BUN 30* 10/05/2014 0610   BUN 23.6 07/23/2014 0920   CREATININE 0.79 10/05/2014 0610   CREATININE 1.0 07/23/2014 0920   CALCIUM 8.3* 10/05/2014 0610   CALCIUM 9.3 07/23/2014 0920  PROT 6.5 10/04/2014 0825   PROT 6.0* 07/23/2014 0920   ALBUMIN 3.2* 10/04/2014 0825   ALBUMIN 3.2* 07/23/2014 0920   AST 16 10/04/2014 0825   AST 20 07/23/2014 0920   ALT 37* 10/04/2014 0825   ALT 47 07/23/2014 0920   ALKPHOS 98 10/04/2014 0825   ALKPHOS 42 07/23/2014 0920   BILITOT 1.0 10/04/2014 0825   BILITOT 1.34* 07/23/2014 0920   GFRNONAA 73* 10/05/2014 0610   GFRAA 84* 10/05/2014 0610    No results found for: SPEP, UPEP  Lab Results  Component Value Date   WBC 3.8* 10/29/2014   NEUTROABS 1.9 10/29/2014   HGB 9.7* 10/29/2014   HCT 29.2* 10/29/2014   MCV 82.7 10/29/2014   PLT 94* 10/29/2014      Chemistry      Component Value Date/Time   NA 135 10/05/2014 0610   NA 142 07/23/2014 0920   K 3.9 10/05/2014 0610   K 3.5 07/23/2014 0920   CL 107 10/05/2014 0610   CO2 23 10/05/2014 0610   CO2 27 07/23/2014 0920   BUN  30* 10/05/2014 0610   BUN 23.6 07/23/2014 0920   CREATININE 0.79 10/05/2014 0610   CREATININE 1.0 07/23/2014 0920   GLU 199* 04/23/2014 0910      Component Value Date/Time   CALCIUM 8.3* 10/05/2014 0610   CALCIUM 9.3 07/23/2014 0920   ALKPHOS 98 10/04/2014 0825   ALKPHOS 42 07/23/2014 0920   AST 16 10/04/2014 0825   AST 20 07/23/2014 0920   ALT 37* 10/04/2014 0825   ALT 47 07/23/2014 0920   BILITOT 1.0 10/04/2014 0825   BILITOT 1.34* 07/23/2014 0920      ASSESSMENT & PLAN:  MDS (myelodysplastic syndrome) with 5q deletion Overall, she tolerated treatment very poorly.She will also continue on prednisone daily. I plan to give a trial of thalidomide to control her anemia. We discussed the role of chemotherapy. The intent is for palliative.  We discussed some of the risks, benefits, side-effects of Thalidomide.   Some of the short term side-effects included, though not limited to, risk of fatigue, weight loss, pancytopenia, life-threatening infections, need for transfusions of blood products, nausea, vomiting, change in bowel habits, blood clots, admission to hospital for various reasons, and risks of death.   Long term side-effects are also discussed including risks of infertility, permanent damage to nerve function, chronic fatigue, and rare secondary malignancy including bone marrow disorders such as acute leukemia.   The patient is aware that the response rates discussed earlier is not guaranteed.    After a long discussion, patient made an informed decision to proceed with the prescribed plan of care and went ahead to sign the consent form today.   Patient education material was dispensed    Anemia in neoplastic disease This is likely due to recent treatment. The patient denies recent history of bleeding such as epistaxis, hematuria or hematochezia. She is asymptomatic from the anemia. I will observe for now.  She does not require transfusion now.  Repeat serum  erythropoietin is still very high, confirming significant bone marrow failure The plan would be to give her 2 units of blood whenever her hemoglobin dropped to less than 8 g I will plan to give a trial of thalidomide in the future due to recent data that it may help.   Thrombocytopenia This has improved since we hold her chemotherapy. Recommend continue observation.   Psoriasis This has flared up since recent prednisone taper. She is currently on 12.5  mg a prednisone taper.    Orders Placed This Encounter  Procedures  . Hold Tube, Blood Bank    Standing Status: Standing     Number of Occurrences: 22     Standing Expiration Date: 10/30/2015   All questions were answered. The patient knows to call the clinic with any problems, questions or concerns. No barriers to learning was detected. I spent 30 minutes counseling the patient face to face. The total time spent in the appointment was 40 minutes and more than 50% was on counseling and review of test results     [facility], [place], MD 10/29/2014 1:37 PM

## 2014-10-30 ENCOUNTER — Telehealth: Payer: Self-pay | Admitting: *Deleted

## 2014-10-30 NOTE — Telephone Encounter (Signed)
Pt came in yesterday and discussed therapy -" mom read over the information last night and has decided she does not want to take this medication "  Per discussion of thalidomide including overall good toleration by patients this RN  inquired as to pt's concern with medication Pt's daughter states " mom is concerned because this pill is a sister to a previous one she took that after taking it for 10 days she ended up in the hospital "  THIS RN INFORMED EMMA ABOVE INFORMATION WILL BE FORWARDED TO DR [place].  Pt is currently scheduled for lab and possible blood transfusion next week.  Next visit with MD is 2/25  Return call number for pt is [phone]

## 2014-10-30 NOTE — Telephone Encounter (Signed)
She will get blood on 2/1 and then 2/11 and every 2 weeks.

## 2014-10-30 NOTE — Telephone Encounter (Signed)
[facility] to notify them of Rx canceled for Thalomid.  S/w J[facility].

## 2014-10-30 NOTE — Telephone Encounter (Signed)
Spoke w/ pt's daughter, Terrence Dupont, about Thalomid.   She says pt has thought a lot about it and really does not want to try any further chemotherapy.  She is worried about side effects and her quality of life.  Pt does want to continue to get blood transfusions for now.  We did discuss [place] and daughter states they will let us know if pt wants Hospice, but at this point she still wants to pursue transfusions.   She did ask if pt is supposed to have lab on 2/11?  Pt scheduled for lab/transfusion on 2/4 and 2/25 but not on 2/11.  Daughter thought labs were still to be weekly even though transfusions scheduled every other week?

## 2014-10-30 NOTE — Telephone Encounter (Signed)
Cameo, can you call her daughter about this? If she does not want to try this, I have no other options left for her apart from just transfusion or hospice.

## 2014-10-31 ENCOUNTER — Telehealth: Payer: Self-pay

## 2014-10-31 NOTE — Telephone Encounter (Signed)
Cody from [facility] called stating pt decided she did not want to take thalomid. The prescription will be put on hold until we contact them otherwise. When chart investigated this was already charted on 10/30/14.

## 2014-11-02 ENCOUNTER — Other Ambulatory Visit: Payer: Self-pay | Admitting: Hematology and Oncology

## 2014-11-02 DIAGNOSIS — D469 Myelodysplastic syndrome, unspecified: Secondary | ICD-10-CM

## 2014-11-05 ENCOUNTER — Ambulatory Visit (HOSPITAL_COMMUNITY)
Admission: RE | Admit: 2014-11-05 | Discharge: 2014-11-05 | Disposition: A | Discharge status: Home / Self Care | Payer: Medicare Other | Source: Ambulatory Visit | Attending: Family Medicine | Admitting: Family Medicine

## 2014-11-05 ENCOUNTER — Ambulatory Visit (HOSPITAL_BASED_OUTPATIENT_CLINIC_OR_DEPARTMENT_OTHER): Payer: Medicare Other | Admitting: Hematology and Oncology

## 2014-11-05 ENCOUNTER — Encounter (HOSPITAL_COMMUNITY): Payer: Medicare Other

## 2014-11-05 ENCOUNTER — Telehealth: Payer: Self-pay | Admitting: Hematology and Oncology

## 2014-11-05 ENCOUNTER — Other Ambulatory Visit: Payer: Self-pay | Admitting: Hematology and Oncology

## 2014-11-05 ENCOUNTER — Encounter: Payer: Self-pay | Admitting: Hematology and Oncology

## 2014-11-05 ENCOUNTER — Other Ambulatory Visit (HOSPITAL_BASED_OUTPATIENT_CLINIC_OR_DEPARTMENT_OTHER): Payer: Medicare Other

## 2014-11-05 ENCOUNTER — Ambulatory Visit (HOSPITAL_BASED_OUTPATIENT_CLINIC_OR_DEPARTMENT_OTHER): Payer: Medicare Other

## 2014-11-05 DIAGNOSIS — N183 Chronic kidney disease, stage 3 unspecified: Secondary | ICD-10-CM

## 2014-11-05 DIAGNOSIS — N179 Acute kidney failure, unspecified: Secondary | ICD-10-CM

## 2014-11-05 DIAGNOSIS — M79605 Pain in left leg: Principal | ICD-10-CM

## 2014-11-05 DIAGNOSIS — D46C Myelodysplastic syndrome with isolated del(5q) chromosomal abnormality: Secondary | ICD-10-CM

## 2014-11-05 DIAGNOSIS — M79609 Pain in unspecified limb: Secondary | ICD-10-CM

## 2014-11-05 DIAGNOSIS — M79604 Pain in right leg: Secondary | ICD-10-CM | POA: Diagnosis not present

## 2014-11-05 DIAGNOSIS — I952 Hypotension due to drugs: Secondary | ICD-10-CM

## 2014-11-05 DIAGNOSIS — D630 Anemia in neoplastic disease: Secondary | ICD-10-CM

## 2014-11-05 DIAGNOSIS — Z95828 Presence of other vascular implants and grafts: Secondary | ICD-10-CM

## 2014-11-05 DIAGNOSIS — N189 Chronic kidney disease, unspecified: Secondary | ICD-10-CM

## 2014-11-05 DIAGNOSIS — R1110 Vomiting, unspecified: Secondary | ICD-10-CM

## 2014-11-05 DIAGNOSIS — R252 Cramp and spasm: Secondary | ICD-10-CM

## 2014-11-05 DIAGNOSIS — Z452 Encounter for adjustment and management of vascular access device: Secondary | ICD-10-CM

## 2014-11-05 HISTORY — DX: Pain in right leg: M79.604

## 2014-11-05 LAB — COMPREHENSIVE METABOLIC PANEL (CC13)
ALBUMIN: 4.1 g/dL (ref 3.5–5.0)
ALT: 53 U/L (ref 0–55)
ANION GAP: 20 meq/L — AB (ref 3–11)
AST: 16 U/L (ref 5–34)
Alkaline Phosphatase: 39 U/L — ABNORMAL LOW (ref 40–150)
BUN: 79.6 mg/dL — ABNORMAL HIGH (ref 7.0–26.0)
CHLORIDE: 102 meq/L (ref 98–109)
CO2: 24 mEq/L (ref 22–29)
CREATININE: 2.1 mg/dL — AB (ref 0.6–1.1)
Calcium: 9.2 mg/dL (ref 8.4–10.4)
EGFR: 21 mL/min/{1.73_m2} — ABNORMAL LOW (ref >90)
GLUCOSE: 167 mg/dL — AB (ref 70–140)
Potassium: 3.6 mEq/L (ref 3.5–5.1)
Sodium: 145 mEq/L (ref 136–145)
Total Bilirubin: 0.85 mg/dL (ref 0.20–1.20)
Total Protein: 6.9 g/dL (ref 6.4–8.3)

## 2014-11-05 LAB — CBC WITH DIFFERENTIAL/PLATELET
BASO%: 1.1 % (ref 0.0–2.0)
Basophils Absolute: 0.1 10*3/uL (ref 0.0–0.1)
EOS%: 21 % — AB (ref 0.0–7.0)
Eosinophils Absolute: 1.3 10*3/uL — ABNORMAL HIGH (ref 0.0–0.5)
HCT: 27.5 % — ABNORMAL LOW (ref 34.8–46.6)
HEMOGLOBIN: 9.1 g/dL — AB (ref 11.6–15.9)
LYMPH%: 10.4 % — AB (ref 14.0–49.7)
MCH: 27.1 pg (ref 25.1–34.0)
MCHC: 33 g/dL (ref 31.5–36.0)
MCV: 82.1 fL (ref 79.5–101.0)
MONO#: 0.2 10*3/uL (ref 0.1–0.9)
MONO%: 3.3 % (ref 0.0–14.0)
NEUT%: 64.2 % (ref 38.4–76.8)
NEUTROS ABS: 3.9 10*3/uL (ref 1.5–6.5)
Platelets: 143 10*3/uL — ABNORMAL LOW (ref 145–400)
RBC: 3.35 10*6/uL — ABNORMAL LOW (ref 3.70–5.45)
RDW: 15.7 % — ABNORMAL HIGH (ref 11.2–14.5)
WBC: 6 10*3/uL (ref 3.9–10.3)
lymph#: 0.6 10*3/uL — ABNORMAL LOW (ref 0.9–3.3)

## 2014-11-05 LAB — HOLD TUBE, BLOOD BANK

## 2014-11-05 MED ORDER — ONDANSETRON 8 MG/50ML IVPB (CHCC)
8.0000 mg | Freq: Once | INTRAVENOUS | Status: AC
  Administered 2014-11-05: 8 mg via INTRAVENOUS

## 2014-11-05 MED ORDER — HEPARIN SOD (PORK) LOCK FLUSH 100 UNIT/ML IV SOLN
500.0000 [IU] | Freq: Once | INTRAVENOUS | Status: AC
  Administered 2014-11-05: 500 [IU] via INTRAVENOUS
  Filled 2014-11-05: qty 5

## 2014-11-05 MED ORDER — HEPARIN SOD (PORK) LOCK FLUSH 100 UNIT/ML IV SOLN
500.0000 [IU] | Freq: Once | INTRAVENOUS | Status: DC | PRN
  Filled 2014-11-05: qty 5

## 2014-11-05 MED ORDER — ONDANSETRON 8 MG/NS 50 ML IVPB
INTRAVENOUS | Status: AC
  Filled 2014-11-05: qty 8

## 2014-11-05 MED ORDER — SODIUM CHLORIDE 0.9 % IV SOLN
Freq: Once | INTRAVENOUS | Status: AC
  Administered 2014-11-05: 11:00:00 via INTRAVENOUS

## 2014-11-05 MED ORDER — SODIUM CHLORIDE 0.9 % IJ SOLN
10.0000 mL | INTRAMUSCULAR | Status: DC | PRN
  Administered 2014-11-05: 10 mL via INTRAVENOUS
  Filled 2014-11-05: qty 10

## 2014-11-05 MED ORDER — SODIUM CHLORIDE 0.9 % IJ SOLN
10.0000 mL | INTRAMUSCULAR | Status: DC | PRN
  Filled 2014-11-05: qty 10

## 2014-11-05 NOTE — Assessment & Plan Note (Signed)
She has clinical hypotension. I would discontinue Lasix and Cozaar. We will monitor her blood pressure carefully.

## 2014-11-05 NOTE — Assessment & Plan Note (Signed)
This is likely due to recent treatment. The patient denies recent history of bleeding such as epistaxis, hematuria or hematochezia. She is asymptomatic from the anemia. I will observe for now.  She does not require transfusion now.  Repeat serum erythropoietin is still very high, confirming significant bone marrow failure The plan would be to give her 2 units of blood whenever her hemoglobin dropped to less than 8 g

## 2014-11-05 NOTE — Patient Instructions (Signed)
Nausea and Vomiting Nausea is a sick feeling that often comes before throwing up (vomiting). Vomiting is a reflex where stomach contents come out of your mouth. Vomiting can cause severe loss of body fluids (dehydration). Children and elderly adults can become dehydrated quickly, especially if they also have diarrhea. Nausea and vomiting are symptoms of a condition or disease. It is important to find the cause of your symptoms. CAUSES   Direct irritation of the stomach lining. This irritation can result from increased acid production (gastroesophageal reflux disease), infection, food poisoning, taking certain medicines (such as nonsteroidal anti-inflammatory drugs), alcohol use, or tobacco use.  Signals from the brain.These signals could be caused by a headache, heat exposure, an inner ear disturbance, increased pressure in the brain from injury, infection, a tumor, or a concussion, pain, emotional stimulus, or metabolic problems.  An obstruction in the gastrointestinal tract (bowel obstruction).  Illnesses such as diabetes, hepatitis, gallbladder problems, appendicitis, kidney problems, cancer, sepsis, atypical symptoms of a heart attack, or eating disorders.  Medical treatments such as chemotherapy and radiation.  Receiving medicine that makes you sleep (general anesthetic) during surgery. DIAGNOSIS Your caregiver may ask for tests to be done if the problems do not improve after a few days. Tests may also be done if symptoms are severe or if the reason for the nausea and vomiting is not clear. Tests may include:  Urine tests.  Blood tests.  Stool tests.  Cultures (to look for evidence of infection).  X-rays or other imaging studies. Test results can help your caregiver make decisions about treatment or the need for additional tests. TREATMENT You need to stay well hydrated. Drink frequently but in small amounts.You may wish to drink water, sports drinks, clear broth, or eat frozen  ice pops or gelatin dessert to help stay hydrated.When you eat, eating slowly may help prevent nausea.There are also some antinausea medicines that may help prevent nausea. HOME CARE INSTRUCTIONS   Take all medicine as directed by your caregiver.  If you do not have an appetite, do not force yourself to eat. However, you must continue to drink fluids.  If you have an appetite, eat a normal diet unless your caregiver tells you differently.  Eat a variety of complex carbohydrates (rice, wheat, potatoes, bread), lean meats, yogurt, fruits, and vegetables.  Avoid high-fat foods because they are more difficult to digest.  Drink enough water and fluids to keep your urine clear or pale yellow.  If you are dehydrated, ask your caregiver for specific rehydration instructions. Signs of dehydration may include:  Severe thirst.  Dry lips and mouth.  Dizziness.  Dark urine.  Decreasing urine frequency and amount.  Confusion.  Rapid breathing or pulse. SEEK IMMEDIATE MEDICAL CARE IF:   You have blood or brown flecks (like coffee grounds) in your vomit.  You have black or bloody stools.  You have a severe headache or stiff neck.  You are confused.  You have severe abdominal pain.  You have chest pain or trouble breathing.  You do not urinate at least once every 8 hours.  You develop cold or clammy skin.  You continue to vomit for longer than 24 to 48 hours.  You have a fever. MAKE SURE YOU:   Understand these instructions.  Will watch your condition.  Will get help right away if you are not doing well or get worse. Document Released: 09/21/2005 Document Revised: 12/14/2011 Document Reviewed: 02/18/2011 [employer] Patient Information 2015 [employer]. This information is not intended   to replace advice given to you by your health care provider. Make sure you discuss any questions you have with your health care provider. Dehydration, Adult Dehydration means your body  does not have as much fluid as it needs. Your kidneys, brain, and heart will not work properly without the right amount of fluids and salt.  HOME CARE  Ask your doctor how to replace body fluid losses (rehydrate).  Drink enough fluids to keep your pee (urine) clear or pale yellow.  Drink small amounts of fluids often if you feel sick to your stomach (nauseous) or throw up (vomit).  Eat like you normally do.  Avoid:  Foods or drinks high in sugar.  Bubbly (carbonated) drinks.  Juice.  Very hot or cold fluids.  Drinks with caffeine.  Fatty, greasy foods.  Alcohol.  Tobacco.  Eating too much.  Gelatin desserts.  Wash your hands to avoid spreading germs (bacteria, viruses).  Only take medicine as told by your doctor.  Keep all doctor visits as told. GET HELP RIGHT AWAY IF:   You cannot drink something without throwing up.  You get worse even with treatment.  Your vomit has blood in it or looks greenish.  Your poop (stool) has blood in it or looks black and tarry.  You have not peed in 6 to 8 hours.  You pee a small amount of very dark pee.  You have a fever.  You pass out (faint).  You have belly (abdominal) pain that gets worse or stays in one spot (localizes).  You have a rash, stiff neck, or bad headache.  You get easily annoyed, sleepy, or are hard to wake up.  You feel weak, dizzy, or very thirsty. MAKE SURE YOU:   Understand these instructions.  Will watch your condition.  Will get help right away if you are not doing well or get worse. Document Released: 07/18/2009 Document Revised: 12/14/2011 Document Reviewed: 05/11/2011 [facility] Patient Information 2015 [place], [place]. This information is not intended to replace advice given to you by your health care provider. Make sure you discuss any questions you have with your health care provider.

## 2014-11-05 NOTE — Progress Notes (Signed)
Per Dr Alvy Bimler, CMET drawn in the clinic, pt needs IVF and 8mg  zofran IV for n/v.  Pt to go to [place] for LE doppler at 12:00, and will have f/u with Dr Alvy Bimler at 1pm today.  Pt and pt's daughter are aware of the plan.     Due to 12pm appt at [place], IVF liter bag was not completed.  Pt remained accessed and Dr Calton Dach RN will deaccess if no further IVF or antiemetics are needed.  Pt is aware of the plan.

## 2014-11-05 NOTE — Progress Notes (Signed)
*  Preliminary Results* Bilateral lower extremity venous duplex completed. Bilateral lower extremities are negative for deep vein thrombosis. There is no evidence of Baker's cyst bilaterally.  Preliminary results discussed with Dr.Gorsuch.  11/05/2014  Maudry Mayhew, RVT, RDCS, RDMS

## 2014-11-05 NOTE — Assessment & Plan Note (Signed)
This is related to dehydration. I recommend discontinuation of Lasix, potassium supplement and Cozaar. I will continue daily IV fluids this week. I will recheck her kidney function in 3 days time.

## 2014-11-05 NOTE — Telephone Encounter (Signed)
gv adn printed appt sched and avs for pt for Feb....sed added tx.   °

## 2014-11-05 NOTE — Assessment & Plan Note (Signed)
Overall, she tolerated treatment very poorly.She will also continue on prednisone daily. I plan to give a trial of thalidomide to control her anemia. However, she got ill with now acute renal failure. I recommend holding off plans to start thalidomide. She will continue weekly blood draw and transfusion support every other week.

## 2014-11-05 NOTE — Progress Notes (Signed)
[facility] OFFICE PROGRESS NOTE  Patient Care Team: Mayra Neer, MD as PCP - General (Family Medicine) Heath Lark, MD as Consulting Physician (Hematology and Oncology)  SUMMARY OF ONCOLOGIC HISTORY:  This is a very pleasant 81 year old lady who become transfusion dependent since 2014. She denies any bleeding such as epistaxis, hematuria, or hematochezia. 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 Erythropoietin stimulating agents were stopped when her erythropoietin level came back over 500 In October 2014, she was started on Revlimid however develop severe allergic reaction to Revlimid. That was discontinued. On 08/07/2013, we started her on Vidaza for 2 cycles. On 10/04/2013, repeat a bone marrow aspirate and biopsy showed persistent disease. Additional testing revealed that her disease is JAK 2 positive On 11/06/2013, Vidaza is resumed after her second opinion from [facility] On 02/08/2014, repeat bone marrow aspirate and biopsy showed no evidence of increased blasts On 04/16/2014, Vidaza is discontinued due to increased transfusion requirement and progressive weakness On 04/30/14: she was started on 100 mg danazol On 05/21/14: Danazol is increased to 200 mg daily. On 06/18/2014, danazol was discontinued. On 06/25/14: Shanon Brow is added On 07/16/2014, Shanon Brow is on hold due to severe pancytopenia and uncontrolled nausea and vomiting From 09/28/2014 to 10/05/2014, the patient was admitted to the hospital due to sepsis, bacteremia and UTI    INTERVAL HISTORY: Please see below for problem oriented charting. She is seen urgently today due to uncontrolled nausea, vomiting and dehydration.  She has near syncopal episode this weekend and has been complaining of excessive fatigue. She also complained of bilateral leg cramps and pain behind the calf. She has not started on  thalidomide.  REVIEW OF SYSTEMS:   Constitutional: Denies fevers, chills or abnormal weight loss Eyes: Denies blurriness of vision Ears, nose, mouth, throat, and face: Denies mucositis or sore throat Respiratory: Denies cough, dyspnea or wheezes Cardiovascular: Denies palpitation, chest discomfort or lower extremity swelling Skin: Denies abnormal skin rashes Lymphatics: Denies new lymphadenopathy or easy bruising Neurological:Denies numbness, tingling  Behavioral/Psych: Mood is stable, no new changes  All other systems were reviewed with the patient and are negative.  I have reviewed the past medical history, past surgical history, social history and family history with the patient and they are unchanged from previous note.  ALLERGIES:  is allergic to novocain; oxycodone; codeine; methimazole; and revlimid.  MEDICATIONS:  Current Outpatient Prescriptions  Medication Sig Dispense Refill  . acetaminophen (TYLENOL) 500 MG tablet Take 1,000 mg by mouth every 6 (six) hours as needed for fever (fever).     Marland Kitchen desoximetasone (TOPICORT) 0.25 % cream Apply 1 application topically 2 (two) times daily as needed. For rash    . furosemide (LASIX) 40 MG tablet Take 1 tablet (40 mg total) by mouth daily. 30 tablet 0  . HYDROcodone-acetaminophen (NORCO/VICODIN) 5-325 MG per tablet Take 1 tablet by mouth every 4 (four) hours as needed for moderate pain. 60 tablet 0  . hydrOXYzine (VISTARIL) 25 MG capsule Take 1 capsule (25 mg total) by mouth every 4 (four) hours as needed. 90 capsule 3  . insulin aspart (NOVOLOG) 100 UNIT/ML injection Inject 15 Units into the skin 3 (three) times daily before meals. For Blood sugar over 200    . insulin detemir (LEVEMIR) 100 UNIT/ML injection Inject 12 Units into the skin daily.     Marland Kitchen LORazepam (ATIVAN) 0.5 MG tablet Take 0.5 mg by  mouth at bedtime.     Marland Kitchen losartan (COZAAR) 25 MG tablet Take 1 tablet (25 mg total) by mouth daily. 30 tablet 0  . metFORMIN (GLUCOPHAGE) 500  MG tablet Take 1 tablet (500 mg total) by mouth 2 (two) times daily with a meal. 60 tablet 1  . metoprolol succinate (TOPROL-XL) 100 MG 24 hr tablet Take 50 mg by mouth every morning.     . mometasone (ELOCON) 0.1 % cream Apply 1 application topically daily as needed. Rash    . Multiple Vitamin (MULTIVITAMIN) tablet Take 1 tablet by mouth every morning.     Marland Kitchen omeprazole (PRILOSEC) 40 MG capsule Take 1 capsule (40 mg total) by mouth daily. 90 capsule 3  . ondansetron (ZOFRAN) 4 MG tablet Take 1 tablet (4 mg total) by mouth every 6 (six) hours. 30 tablet 0  . ondansetron (ZOFRAN) 8 MG tablet Take 1 tablet (8 mg total) by mouth 2 (two) times daily as needed for nausea or vomiting. 90 tablet 3  . potassium chloride SA (K-DUR,KLOR-CON) 20 MEQ tablet Take 1 tablet (20 mEq total) by mouth daily. 30 tablet 0  . predniSONE (DELTASONE) 10 MG tablet TAKE 1 TABLET BY MOUTH EVERY DAY WITH BREAKFAST 30 tablet 0  . prochlorperazine (COMPAZINE) 10 MG tablet Take 10 mg by mouth daily.     . ranitidine (ZANTAC) 75 MG tablet Take 1 tablet (75 mg total) by mouth at bedtime. 30 tablet 6  . scopolamine (TRANSDERM-SCOP) 1 MG/3DAYS Place 1 patch (1.5 mg total) onto the skin every 3 (three) days. 10 patch 12  . thalidomide (THALOMID) 50 MG capsule Take 1 capsule (50 mg total) by mouth at bedtime. Take with water. 30 capsule 0  . traMADol (ULTRAM) 50 MG tablet Take 50 mg by mouth 2 (two) times daily.     Marland Kitchen triamcinolone cream (KENALOG) 0.1 %      Current Facility-Administered Medications  Medication Dose Route Frequency Provider Last Rate Last Dose  . sodium chloride 0.9 % injection 10 mL  10 mL Intravenous PRN Heath Lark, MD   10 mL at 11/05/14 1330   Facility-Administered Medications Ordered in Other Visits  Medication Dose Route Frequency Provider Last Rate Last Dose  . sodium chloride 0.9 % injection 3 mL  3 mL Intracatheter PRN Heath Lark, MD   3 mL at 10/25/13 1407    PHYSICAL EXAMINATION: ECOG PERFORMANCE  STATUS: 1 - Symptomatic but completely ambulatory Wt Readings from Last 3 Encounters:  10/29/14 118 lb 12.8 oz (53.887 kg)  10/08/14 119 lb 14.4 oz (54.386 kg)  10/05/14 120 lb 11.2 oz (54.749 kg)   Temp Readings from Last 3 Encounters:  11/05/14 97.7 F (36.5 C) Oral  10/29/14 97.8 F (36.6 C) Oral  10/22/14 98 F (36.7 C) Oral   BP Readings from Last 3 Encounters:  11/05/14 112/34  10/29/14 142/47  10/22/14 135/43   Pulse Readings from Last 3 Encounters:  11/05/14 69  10/29/14 82  10/22/14 70    GENERAL:alert, no distress and comfortable SKIN: skin color, texture, turgor are normal, no rashes or significant lesions. Noted bruising and psoriasis EYES: normal, Conjunctiva are pink and non-injected, sclera clear OROPHARYNX:no exudate, no erythema and lips, buccal mucosa, and tongue normal  NECK: supple, thyroid normal size, non-tender, without nodularity LYMPH:  no palpable lymphadenopathy in the cervical, axillary or inguinal LUNGS: clear to auscultation and percussion with normal breathing effort HEART: regular rate & rhythm and no murmurs and no lower extremity edema ABDOMEN:abdomen  soft, non-tender and normal bowel sounds Musculoskeletal:no cyanosis of digits and no clubbing  NEURO: alert & oriented x 3 with fluent speech, no focal motor/sensory deficits  LABORATORY DATA:  I have reviewed the data as listed    Component Value Date/Time   NA 145 11/05/2014 1028   NA 135 10/05/2014 0610   K 3.6 11/05/2014 1028   K 3.9 10/05/2014 0610   CL 107 10/05/2014 0610   CO2 24 11/05/2014 1028   CO2 23 10/05/2014 0610   GLUCOSE 167* 11/05/2014 1028   GLUCOSE 106* 10/05/2014 0610   BUN 79.6* 11/05/2014 1028   BUN 30* 10/05/2014 0610   CREATININE 2.1* 11/05/2014 1028   CREATININE 0.79 10/05/2014 0610   CALCIUM 9.2 11/05/2014 1028   CALCIUM 8.3* 10/05/2014 0610   PROT 6.9 11/05/2014 1028   PROT 6.5 10/04/2014 0825   ALBUMIN 4.1 11/05/2014 1028   ALBUMIN 3.2* 10/04/2014  0825   AST 16 11/05/2014 1028   AST 16 10/04/2014 0825   ALT 53 11/05/2014 1028   ALT 37* 10/04/2014 0825   ALKPHOS 39* 11/05/2014 1028   ALKPHOS 98 10/04/2014 0825   BILITOT 0.85 11/05/2014 1028   BILITOT 1.0 10/04/2014 0825   GFRNONAA 73* 10/05/2014 0610   GFRAA 84* 10/05/2014 0610    No results found for: SPEP, UPEP  Lab Results  Component Value Date   WBC 6.0 11/05/2014   NEUTROABS 3.9 11/05/2014   HGB 9.1* 11/05/2014   HCT 27.5* 11/05/2014   MCV 82.1 11/05/2014   PLT 143* 11/05/2014      Chemistry      Component Value Date/Time   NA 145 11/05/2014 1028   NA 135 10/05/2014 0610   K 3.6 11/05/2014 1028   K 3.9 10/05/2014 0610   CL 107 10/05/2014 0610   CO2 24 11/05/2014 1028   CO2 23 10/05/2014 0610   BUN 79.6* 11/05/2014 1028   BUN 30* 10/05/2014 0610   CREATININE 2.1* 11/05/2014 1028   CREATININE 0.79 10/05/2014 0610   GLU 199* 04/23/2014 0910      Component Value Date/Time   CALCIUM 9.2 11/05/2014 1028   CALCIUM 8.3* 10/05/2014 0610   ALKPHOS 39* 11/05/2014 1028   ALKPHOS 98 10/04/2014 0825   AST 16 11/05/2014 1028   AST 16 10/04/2014 0825   ALT 53 11/05/2014 1028   ALT 37* 10/04/2014 0825   BILITOT 0.85 11/05/2014 1028   BILITOT 1.0 10/04/2014 0825       RADIOGRAPHIC STUDIES: Ultrasound venous Dopplers show no evidence of DVT I have personally reviewed the radiological images as listed and agreed with the findings in the report.    ASSESSMENT & PLAN:  MDS (myelodysplastic syndrome) with 5q deletion Overall, she tolerated treatment very poorly.She will also continue on prednisone daily. I plan to give a trial of thalidomide to control her anemia. However, she got ill with now acute renal failure. I recommend holding off plans to start thalidomide. She will continue weekly blood draw and transfusion support every other week.   Acute on chronic renal failure This is related to dehydration. I recommend discontinuation of Lasix, potassium  supplement and Cozaar. I will continue daily IV fluids this week. I will recheck her kidney function in 3 days time.   Anemia in neoplastic disease This is likely due to recent treatment. The patient denies recent history of bleeding such as epistaxis, hematuria or hematochezia. She is asymptomatic from the anemia. I will observe for now.  She does not require  transfusion now.  Repeat serum erythropoietin is still very high, confirming significant bone marrow failure The plan would be to give her 2 units of blood whenever her hemoglobin dropped to less than 8 g   Hypotension due to drugs She has clinical hypotension. I would discontinue Lasix and Cozaar. We will monitor her blood pressure carefully.   Bilateral leg cramps This is related to renal failure. Repeat ultrasound today did not show any evidence of DVT.    No orders of the defined types were placed in this encounter.   All questions were answered. The patient knows to call the clinic with any problems, questions or concerns. No barriers to learning was detected. I spent 30 minutes counseling the patient face to face. The total time spent in the appointment was 40 minutes and more than 50% was on counseling and review of test results     [facility], [place], MD 11/05/2014 2:20 PM

## 2014-11-05 NOTE — Assessment & Plan Note (Signed)
This is related to renal failure. Repeat ultrasound today did not show any evidence of DVT.

## 2014-11-06 ENCOUNTER — Ambulatory Visit (HOSPITAL_BASED_OUTPATIENT_CLINIC_OR_DEPARTMENT_OTHER): Payer: Medicare Other

## 2014-11-06 ENCOUNTER — Other Ambulatory Visit: Payer: Self-pay | Admitting: Hematology and Oncology

## 2014-11-06 ENCOUNTER — Other Ambulatory Visit: Payer: Self-pay | Admitting: *Deleted

## 2014-11-06 DIAGNOSIS — N179 Acute kidney failure, unspecified: Secondary | ICD-10-CM

## 2014-11-06 DIAGNOSIS — I952 Hypotension due to drugs: Secondary | ICD-10-CM

## 2014-11-06 DIAGNOSIS — D46C Myelodysplastic syndrome with isolated del(5q) chromosomal abnormality: Secondary | ICD-10-CM

## 2014-11-06 DIAGNOSIS — R1110 Vomiting, unspecified: Secondary | ICD-10-CM

## 2014-11-06 MED ORDER — SODIUM CHLORIDE 0.9 % IJ SOLN
10.0000 mL | INTRAMUSCULAR | Status: DC | PRN
  Administered 2014-11-06: 10 mL
  Filled 2014-11-06: qty 10

## 2014-11-06 MED ORDER — ONDANSETRON 8 MG/50ML IVPB (CHCC)
8.0000 mg | Freq: Once | INTRAVENOUS | Status: AC
  Administered 2014-11-06: 8 mg via INTRAVENOUS

## 2014-11-06 MED ORDER — HEPARIN SOD (PORK) LOCK FLUSH 100 UNIT/ML IV SOLN
500.0000 [IU] | Freq: Once | INTRAVENOUS | Status: AC | PRN
  Administered 2014-11-06: 500 [IU]
  Filled 2014-11-06: qty 5

## 2014-11-06 MED ORDER — ONDANSETRON 8 MG/NS 50 ML IVPB
INTRAVENOUS | Status: AC
  Filled 2014-11-06: qty 8

## 2014-11-06 MED ORDER — SODIUM CHLORIDE 0.9 % IV SOLN
Freq: Once | INTRAVENOUS | Status: AC
  Administered 2014-11-06: 09:00:00 via INTRAVENOUS

## 2014-11-06 MED ORDER — HYDROCODONE-ACETAMINOPHEN 5-325 MG PO TABS: 1.0000 | ORAL_TABLET | ORAL | Status: DC | PRN

## 2014-11-06 NOTE — Patient Instructions (Signed)

## 2014-11-06 NOTE — Telephone Encounter (Signed)
Pt's daughter called stating request to obtain a prescription for pt's hydrocodone at IV appointment scheduled for tomorrow.  Last fill was 1/8 with dispense of 60 tablets.  Daughter states " mom is using 2-3 tablets a day "   THIS REQUEST WILL BE SENT TO MD AND RN AT DESK FOR PRESCRIPTION REFILL.

## 2014-11-07 ENCOUNTER — Ambulatory Visit (HOSPITAL_BASED_OUTPATIENT_CLINIC_OR_DEPARTMENT_OTHER): Payer: Medicare Other

## 2014-11-07 DIAGNOSIS — N179 Acute kidney failure, unspecified: Secondary | ICD-10-CM

## 2014-11-07 DIAGNOSIS — R1110 Vomiting, unspecified: Secondary | ICD-10-CM

## 2014-11-07 DIAGNOSIS — D46C Myelodysplastic syndrome with isolated del(5q) chromosomal abnormality: Secondary | ICD-10-CM

## 2014-11-07 DIAGNOSIS — I952 Hypotension due to drugs: Secondary | ICD-10-CM

## 2014-11-07 MED ORDER — ONDANSETRON 8 MG/NS 50 ML IVPB
INTRAVENOUS | Status: AC
Start: 2014-11-07 — End: 2014-11-07
  Filled 2014-11-07: qty 8

## 2014-11-07 MED ORDER — ONDANSETRON 8 MG/50ML IVPB (CHCC)
8.0000 mg | Freq: Once | INTRAVENOUS | Status: AC
  Administered 2014-11-07: 8 mg via INTRAVENOUS

## 2014-11-07 MED ORDER — HEPARIN SOD (PORK) LOCK FLUSH 100 UNIT/ML IV SOLN
500.0000 [IU] | Freq: Once | INTRAVENOUS | Status: AC | PRN
  Administered 2014-11-07: 500 [IU]
  Filled 2014-11-07: qty 5

## 2014-11-07 MED ORDER — SODIUM CHLORIDE 0.9 % IJ SOLN
10.0000 mL | INTRAMUSCULAR | Status: DC | PRN
  Administered 2014-11-07: 10 mL
  Filled 2014-11-07: qty 10

## 2014-11-07 MED ORDER — SODIUM CHLORIDE 0.9 % IV SOLN
Freq: Once | INTRAVENOUS | Status: AC
  Administered 2014-11-07: 13:00:00 via INTRAVENOUS

## 2014-11-07 NOTE — Patient Instructions (Signed)

## 2014-11-08 ENCOUNTER — Encounter: Payer: Self-pay | Admitting: *Deleted

## 2014-11-08 ENCOUNTER — Telehealth: Payer: Self-pay | Admitting: Hematology and Oncology

## 2014-11-08 ENCOUNTER — Other Ambulatory Visit (HOSPITAL_BASED_OUTPATIENT_CLINIC_OR_DEPARTMENT_OTHER): Payer: Medicare Other

## 2014-11-08 ENCOUNTER — Other Ambulatory Visit: Payer: Self-pay | Admitting: *Deleted

## 2014-11-08 ENCOUNTER — Telehealth: Payer: Self-pay | Admitting: *Deleted

## 2014-11-08 ENCOUNTER — Other Ambulatory Visit: Payer: Self-pay | Admitting: Hematology and Oncology

## 2014-11-08 ENCOUNTER — Ambulatory Visit (HOSPITAL_BASED_OUTPATIENT_CLINIC_OR_DEPARTMENT_OTHER): Payer: Medicare Other

## 2014-11-08 ENCOUNTER — Encounter: Payer: Self-pay | Admitting: Hematology and Oncology

## 2014-11-08 ENCOUNTER — Ambulatory Visit (HOSPITAL_COMMUNITY)
Admission: RE | Admit: 2014-11-08 | Discharge: 2014-11-08 | Disposition: A | Discharge status: Home / Self Care | Payer: Medicare Other | Source: Ambulatory Visit | Attending: Hematology and Oncology | Admitting: Hematology and Oncology

## 2014-11-08 VITALS — BP 121/43 | HR 73 | Temp 98.4°F | Resp 18

## 2014-11-08 DIAGNOSIS — D630 Anemia in neoplastic disease: Secondary | ICD-10-CM

## 2014-11-08 DIAGNOSIS — R1110 Vomiting, unspecified: Secondary | ICD-10-CM

## 2014-11-08 DIAGNOSIS — D46C Myelodysplastic syndrome with isolated del(5q) chromosomal abnormality: Secondary | ICD-10-CM

## 2014-11-08 DIAGNOSIS — N183 Chronic kidney disease, stage 3 unspecified: Secondary | ICD-10-CM

## 2014-11-08 DIAGNOSIS — E860 Dehydration: Secondary | ICD-10-CM

## 2014-11-08 DIAGNOSIS — N189 Chronic kidney disease, unspecified: Secondary | ICD-10-CM

## 2014-11-08 DIAGNOSIS — N179 Acute kidney failure, unspecified: Secondary | ICD-10-CM

## 2014-11-08 DIAGNOSIS — R112 Nausea with vomiting, unspecified: Secondary | ICD-10-CM

## 2014-11-08 LAB — BASIC METABOLIC PANEL (CC13)
ANION GAP: 15 meq/L — AB (ref 3–11)
BUN: 55.1 mg/dL — ABNORMAL HIGH (ref 7.0–26.0)
CALCIUM: 9.4 mg/dL (ref 8.4–10.4)
CO2: 28 mEq/L (ref 22–29)
CREATININE: 1.6 mg/dL — AB (ref 0.6–1.1)
Chloride: 104 mEq/L (ref 98–109)
EGFR: 28 mL/min/{1.73_m2} — AB (ref >90)
Glucose: 140 mg/dl (ref 70–140)
Potassium: 3.3 mEq/L — ABNORMAL LOW (ref 3.5–5.1)
Sodium: 147 mEq/L — ABNORMAL HIGH (ref 136–145)

## 2014-11-08 LAB — CBC WITH DIFFERENTIAL/PLATELET
BASO%: 1.8 % (ref 0.0–2.0)
Basophils Absolute: 0.1 10*3/uL (ref 0.0–0.1)
EOS ABS: 0.9 10*3/uL — AB (ref 0.0–0.5)
EOS%: 19.5 % — AB (ref 0.0–7.0)
HEMATOCRIT: 24.5 % — AB (ref 34.8–46.6)
HGB: 8.2 g/dL — ABNORMAL LOW (ref 11.6–15.9)
LYMPH#: 0.6 10*3/uL — AB (ref 0.9–3.3)
LYMPH%: 12.9 % — AB (ref 14.0–49.7)
MCH: 27.3 pg (ref 25.1–34.0)
MCHC: 33.5 g/dL (ref 31.5–36.0)
MCV: 81.7 fL (ref 79.5–101.0)
MONO#: 0.2 10*3/uL (ref 0.1–0.9)
MONO%: 4.8 % (ref 0.0–14.0)
NEUT#: 2.8 10*3/uL (ref 1.5–6.5)
NEUT%: 61 % (ref 38.4–76.8)
Platelets: 141 10*3/uL — ABNORMAL LOW (ref 145–400)
RBC: 3 10*6/uL — ABNORMAL LOW (ref 3.70–5.45)
RDW: 14.7 % — ABNORMAL HIGH (ref 11.2–14.5)
WBC: 4.6 10*3/uL (ref 3.9–10.3)
nRBC: 0 % (ref 0–0)

## 2014-11-08 LAB — HOLD TUBE, BLOOD BANK

## 2014-11-08 LAB — PREPARE RBC (CROSSMATCH)

## 2014-11-08 MED ORDER — SODIUM CHLORIDE 0.9 % IJ SOLN
10.0000 mL | INTRAMUSCULAR | Status: DC | PRN
  Administered 2014-11-08: 10 mL
  Filled 2014-11-08: qty 10

## 2014-11-08 MED ORDER — ACETAMINOPHEN 325 MG PO TABS
ORAL_TABLET | ORAL | Status: AC
  Filled 2014-11-08: qty 2

## 2014-11-08 MED ORDER — ONDANSETRON 8 MG/NS 50 ML IVPB
INTRAVENOUS | Status: AC
  Filled 2014-11-08: qty 8

## 2014-11-08 MED ORDER — DIPHENHYDRAMINE HCL 25 MG PO CAPS
25.0000 mg | ORAL_CAPSULE | Freq: Once | ORAL | Status: AC
  Administered 2014-11-08: 25 mg via ORAL

## 2014-11-08 MED ORDER — SODIUM CHLORIDE 0.9 % IV SOLN
Freq: Once | INTRAVENOUS | Status: AC
  Administered 2014-11-08: 12:00:00 via INTRAVENOUS

## 2014-11-08 MED ORDER — ACETAMINOPHEN 325 MG PO TABS
650.0000 mg | ORAL_TABLET | Freq: Once | ORAL | Status: AC
  Administered 2014-11-08: 650 mg via ORAL

## 2014-11-08 MED ORDER — ONDANSETRON 8 MG/50ML IVPB (CHCC)
8.0000 mg | Freq: Once | INTRAVENOUS | Status: AC
  Administered 2014-11-08: 8 mg via INTRAVENOUS

## 2014-11-08 MED ORDER — HEPARIN SOD (PORK) LOCK FLUSH 100 UNIT/ML IV SOLN
500.0000 [IU] | Freq: Once | INTRAVENOUS | Status: AC | PRN
  Administered 2014-11-08: 500 [IU]
  Filled 2014-11-08: qty 5

## 2014-11-08 MED ORDER — DIPHENHYDRAMINE HCL 25 MG PO CAPS
ORAL_CAPSULE | ORAL | Status: AC
Start: 2014-11-08 — End: 2014-11-08
  Filled 2014-11-08: qty 1

## 2014-11-08 NOTE — Telephone Encounter (Signed)
Referral made to [facility] for [place].  S/w Vicky and gave her daughter, Rebekah Cross's cell phone number to contact for visit.

## 2014-11-08 NOTE — Telephone Encounter (Signed)
added pt appts....pt will get sched in tx.

## 2014-11-08 NOTE — Telephone Encounter (Signed)
I spoke with the patient and her daughter today. Despite IV fluids and anti-emetics, she did not feel better. The patient is ready for palliative care and hospice. She would like to keep her IV fluids appointment tomorrow and counsel everything beyond this. I will make referral to hospice.

## 2014-11-08 NOTE — Patient Instructions (Signed)

## 2014-11-09 ENCOUNTER — Ambulatory Visit: Payer: Medicare Other

## 2014-11-09 LAB — TYPE AND SCREEN
ABO/RH(D): A POS
Antibody Screen: NEGATIVE
Unit division: 0

## 2014-11-09 NOTE — Progress Notes (Unsigned)
Pt's daughter called  At 71 to tell nurse that the patient did not feel like coming into center today for fluids. Instructed  Daughter to call center for any concerns or needs arise.

## 2014-11-12 ENCOUNTER — Ambulatory Visit: Payer: Medicare Other

## 2014-11-13 ENCOUNTER — Ambulatory Visit: Payer: Medicare Other

## 2014-11-14 ENCOUNTER — Ambulatory Visit: Payer: Medicare Other

## 2014-11-15 ENCOUNTER — Telehealth: Payer: Self-pay

## 2014-11-15 ENCOUNTER — Other Ambulatory Visit: Payer: Medicare Other

## 2014-11-15 DIAGNOSIS — K5900 Constipation, unspecified: Secondary | ICD-10-CM

## 2014-11-15 MED ORDER — SENNA 8.6 MG PO TABS: 1.0000 | ORAL_TABLET | Freq: Every day | ORAL | Status: AC | PRN

## 2014-11-15 NOTE — Telephone Encounter (Signed)
Rebekah Cross from hospice called asking if pt can have senna-s for constipation. lbm 2 days ago. OK given.

## 2014-11-16 ENCOUNTER — Ambulatory Visit: Payer: Medicare Other

## 2014-11-27 ENCOUNTER — Telehealth: Payer: Self-pay | Admitting: *Deleted

## 2014-11-27 NOTE — Telephone Encounter (Signed)
Yes to oxygen

## 2014-11-27 NOTE — Telephone Encounter (Signed)
Hall Busing, RN with [facility] called to say that she visited Rebekah Cross today and she is getting weaker and is experiencing some shortness of breath.  The patient and the family is asking if there is anything that can be done, short of transfusions or treatment, to help with patient's energy.  Nurse felt that oxygen might be helpful.  Rebekah Cross O2 sat dropped to 83% when ambulating, but is 91% at rest. Would Dr. Alvy Bimler agree to provide oxygen as needed?

## 2014-11-28 NOTE — Telephone Encounter (Signed)
Contacted Hall Busing, RN with Hospice to relay order to start oxygen at home as needed.

## 2014-11-28 NOTE — Progress Notes (Signed)
Required to close chart

## 2014-11-29 ENCOUNTER — Ambulatory Visit: Payer: Medicare Other | Admitting: Hematology and Oncology

## 2014-11-29 ENCOUNTER — Inpatient Hospital Stay (HOSPITAL_COMMUNITY)
Admission: EM | Admit: 2014-11-29 | Discharge: 2014-12-05 | DRG: 811 | Disposition: A | Discharge status: Hospice | Attending: Internal Medicine | Admitting: Internal Medicine

## 2014-11-29 ENCOUNTER — Emergency Department (HOSPITAL_COMMUNITY)

## 2014-11-29 ENCOUNTER — Encounter (HOSPITAL_COMMUNITY): Payer: Self-pay | Admitting: Emergency Medicine

## 2014-11-29 ENCOUNTER — Other Ambulatory Visit: Payer: Self-pay

## 2014-11-29 ENCOUNTER — Other Ambulatory Visit: Payer: Medicare Other

## 2014-11-29 DIAGNOSIS — I129 Hypertensive chronic kidney disease with stage 1 through stage 4 chronic kidney disease, or unspecified chronic kidney disease: Secondary | ICD-10-CM | POA: Diagnosis present

## 2014-11-29 DIAGNOSIS — D46C Myelodysplastic syndrome with isolated del(5q) chromosomal abnormality: Principal | ICD-10-CM | POA: Diagnosis present

## 2014-11-29 DIAGNOSIS — Z885 Allergy status to narcotic agent status: Secondary | ICD-10-CM

## 2014-11-29 DIAGNOSIS — N189 Chronic kidney disease, unspecified: Secondary | ICD-10-CM

## 2014-11-29 DIAGNOSIS — D630 Anemia in neoplastic disease: Secondary | ICD-10-CM

## 2014-11-29 DIAGNOSIS — E872 Acidosis, unspecified: Secondary | ICD-10-CM | POA: Diagnosis present

## 2014-11-29 DIAGNOSIS — E780 Pure hypercholesterolemia: Secondary | ICD-10-CM | POA: Diagnosis present

## 2014-11-29 DIAGNOSIS — R112 Nausea with vomiting, unspecified: Secondary | ICD-10-CM | POA: Diagnosis present

## 2014-11-29 DIAGNOSIS — R1084 Generalized abdominal pain: Secondary | ICD-10-CM

## 2014-11-29 DIAGNOSIS — E1122 Type 2 diabetes mellitus with diabetic chronic kidney disease: Secondary | ICD-10-CM | POA: Diagnosis present

## 2014-11-29 DIAGNOSIS — R6510 Systemic inflammatory response syndrome (SIRS) of non-infectious origin without acute organ dysfunction: Secondary | ICD-10-CM | POA: Diagnosis present

## 2014-11-29 DIAGNOSIS — Z79891 Long term (current) use of opiate analgesic: Secondary | ICD-10-CM

## 2014-11-29 DIAGNOSIS — L299 Pruritus, unspecified: Secondary | ICD-10-CM

## 2014-11-29 DIAGNOSIS — D696 Thrombocytopenia, unspecified: Secondary | ICD-10-CM | POA: Diagnosis present

## 2014-11-29 DIAGNOSIS — Z7982 Long term (current) use of aspirin: Secondary | ICD-10-CM

## 2014-11-29 DIAGNOSIS — N179 Acute kidney failure, unspecified: Secondary | ICD-10-CM | POA: Diagnosis present

## 2014-11-29 DIAGNOSIS — E1129 Type 2 diabetes mellitus with other diabetic kidney complication: Secondary | ICD-10-CM | POA: Diagnosis present

## 2014-11-29 DIAGNOSIS — I5043 Acute on chronic combined systolic (congestive) and diastolic (congestive) heart failure: Secondary | ICD-10-CM | POA: Diagnosis present

## 2014-11-29 DIAGNOSIS — R945 Abnormal results of liver function studies: Secondary | ICD-10-CM

## 2014-11-29 DIAGNOSIS — Z7952 Long term (current) use of systemic steroids: Secondary | ICD-10-CM

## 2014-11-29 DIAGNOSIS — Z888 Allergy status to other drugs, medicaments and biological substances status: Secondary | ICD-10-CM

## 2014-11-29 DIAGNOSIS — R748 Abnormal levels of other serum enzymes: Secondary | ICD-10-CM

## 2014-11-29 DIAGNOSIS — D649 Anemia, unspecified: Secondary | ICD-10-CM | POA: Diagnosis present

## 2014-11-29 DIAGNOSIS — N183 Chronic kidney disease, stage 3 unspecified: Secondary | ICD-10-CM | POA: Diagnosis present

## 2014-11-29 DIAGNOSIS — Z515 Encounter for palliative care: Secondary | ICD-10-CM

## 2014-11-29 DIAGNOSIS — R7989 Other specified abnormal findings of blood chemistry: Secondary | ICD-10-CM | POA: Diagnosis present

## 2014-11-29 DIAGNOSIS — Z884 Allergy status to anesthetic agent status: Secondary | ICD-10-CM

## 2014-11-29 DIAGNOSIS — Z800 Family history of malignant neoplasm of digestive organs: Secondary | ICD-10-CM

## 2014-11-29 DIAGNOSIS — Z66 Do not resuscitate: Secondary | ICD-10-CM

## 2014-11-29 DIAGNOSIS — Z79899 Other long term (current) drug therapy: Secondary | ICD-10-CM

## 2014-11-29 DIAGNOSIS — R0602 Shortness of breath: Secondary | ICD-10-CM | POA: Diagnosis not present

## 2014-11-29 DIAGNOSIS — Z8249 Family history of ischemic heart disease and other diseases of the circulatory system: Secondary | ICD-10-CM

## 2014-11-29 DIAGNOSIS — K830 Cholangitis: Secondary | ICD-10-CM | POA: Diagnosis present

## 2014-11-29 LAB — COMPREHENSIVE METABOLIC PANEL
ALT: 282 U/L — ABNORMAL HIGH (ref 0–35)
ANION GAP: 29 — AB (ref 5–15)
AST: 184 U/L — AB (ref 0–37)
Albumin: 3.3 g/dL — ABNORMAL LOW (ref 3.5–5.2)
Alkaline Phosphatase: 77 U/L (ref 39–117)
BILIRUBIN TOTAL: 1.5 mg/dL — AB (ref 0.3–1.2)
BUN: 45 mg/dL — AB (ref 6–23)
CALCIUM: 8.8 mg/dL (ref 8.4–10.5)
CHLORIDE: 99 mmol/L (ref 96–112)
CO2: 7 mmol/L — CL (ref 19–32)
CREATININE: 1.69 mg/dL — AB (ref 0.50–1.10)
GFR calc Af Amer: 30 mL/min — ABNORMAL LOW (ref >90)
GFR, EST NON AFRICAN AMERICAN: 26 mL/min — AB (ref >90)
Glucose, Bld: 332 mg/dL — ABNORMAL HIGH (ref 70–99)
Potassium: 4.1 mmol/L (ref 3.5–5.1)
Sodium: 135 mmol/L (ref 135–145)
Total Protein: 6.1 g/dL (ref 6.0–8.3)

## 2014-11-29 NOTE — ED Notes (Signed)
Patient with 2+ pitting bilateral ankle edema

## 2014-11-29 NOTE — ED Notes (Signed)
Patient presents from home via EMS for SOB, jaw pain, back pain x1 day.   Per EMS 76% RA, 1 duoneb, 2.5 Albuterol, 125mg  solumedrol in route  Per EMS CBG 351, BP 170/90, 99% on duoneb  20g L AC

## 2014-11-29 NOTE — ED Provider Notes (Signed)
CSN: [id number]     Arrival date & time 11/29/14  2147 History   First MD Initiated Contact with Patient 11/29/14 2313     Chief Complaint  Patient presents with  . Shortness of Breath     (Consider location/radiation/quality/duration/timing/severity/associated sxs/prior Treatment) HPI  This is an 81 year old female with a history of myelodysplastic syndrome no longer on chemotherapy. She is a hospice patient. She has had worsening generalized weakness and shortness of breath for "quite a while" which acutely worsened today. EMS was summoned this evening and they found her to have an oxygen saturation of 76% on room air. She was given an albuterol and Atrovent neb treatment as well as 125 milligrams of Solu-Medrol IV prior to arrival. She has significant improvement in her shortness of breath. She has also had chest tightness and jaw, back and rib pain today. EMS also reports ankle edema. She has persistent nausea and has vomited today. Her appetite is decreased today. She denies abdominal pain, nausea, vomiting, diarrhea or constipation.  Past Medical History  Diagnosis Date  . Anemia, unspecified   . Hypertension   . Thyroid disease   . Hypercholesteremia   . MDS (myelodysplastic syndrome) with 5q deletion 07/11/2013  . Psoriasis 03/19/2014  . Type II or unspecified type diabetes mellitus without mention of complication, not stated as uncontrolled 04/16/2014  . Itching 05/21/2014  . Abdominal pain, acute, left upper quadrant 07/16/2014  . Bilateral leg pain 11/05/2014   Past Surgical History  Procedure Laterality Date  . Abdominal hysterectomy    . Back surgery    . Arthroscopy right knee    . Arthroscopy left knee    . Cataract surgery     Family History  Problem Relation Age of Onset  . Colon cancer Daughter   . Heart disease Maternal Grandmother   . Heart disease Paternal Grandfather    History  Substance Use Topics  . Smoking status: Never Smoker   . Smokeless tobacco:  Never Used  . Alcohol Use: No   OB History    No data available     Review of Systems  All other systems reviewed and are negative.   Allergies  Novocain; Oxycodone; Codeine; Methimazole; and Revlimid  Home Medications   Prior to Admission medications   Medication Sig Start Date End Date Taking? Authorizing Provider  acetaminophen (TYLENOL) 500 MG tablet Take 1,000 mg by mouth every 6 (six) hours as needed for fever (fever).    Yes Historical Provider, MD  aspirin 81 MG tablet Take 81 mg by mouth daily.   Yes Historical Provider, MD  desoximetasone (TOPICORT) 0.25 % cream Apply 1 application topically 2 (two) times daily as needed. For rash   Yes Historical Provider, MD  hydrOXYzine (VISTARIL) 25 MG capsule Take 1 capsule (25 mg total) by mouth every 4 (four) hours as needed. Patient taking differently: Take 25 mg by mouth 2 (two) times daily.  05/21/14  Yes Heath Lark, MD  LORazepam (ATIVAN) 0.5 MG tablet Take 1 mg by mouth at bedtime.    Yes Historical Provider, MD  metoprolol succinate (TOPROL-XL) 100 MG 24 hr tablet Take 50 mg by mouth every morning.    Yes Historical Provider, MD  olmesartan-hydrochlorothiazide (BENICAR HCT) 40-25 MG per tablet Take 1 tablet by mouth daily.   Yes Historical Provider, MD  omeprazole (PRILOSEC) 40 MG capsule Take 1 capsule (40 mg total) by mouth daily. Patient taking differently: Take 40 mg by mouth 2 (two) times daily.  07/16/14  Yes Heath Lark, MD  ondansetron (ZOFRAN) 8 MG tablet Take 1 tablet (8 mg total) by mouth 2 (two) times daily as needed for nausea or vomiting. 09/24/14  Yes Heath Lark, MD  potassium chloride SA (K-DUR,KLOR-CON) 20 MEQ tablet Take 1 tablet (20 mEq total) by mouth daily. 10/05/14  Yes Eugenie Filler, MD  predniSONE (DELTASONE) 2.5 MG tablet Take 12.5 mg by mouth daily with breakfast. Take 5 tablets (12.5 mg) daily.   Yes Historical Provider, MD  prochlorperazine (COMPAZINE) 10 MG tablet Take 10 mg by mouth daily.  07/03/14   Yes Historical Provider, MD  senna (SENOKOT) 8.6 MG TABS tablet Take 1 tablet (8.6 mg total) by mouth daily as needed for mild constipation. Patient taking differently: Take 1-2 tablets by mouth 2 (two) times daily. Take 1 tablet in the am & take 2 tablets in the pm. 11/15/14  Yes Heath Lark, MD  senna-docusate (SENOKOT-S) 8.6-50 MG per tablet Take 1 tablet by mouth 2 (two) times daily.   Yes Historical Provider, MD  traMADol (ULTRAM) 50 MG tablet Take 50 mg by mouth 2 (two) times daily.  04/19/14  Yes Historical Provider, MD  furosemide (LASIX) 40 MG tablet Take 1 tablet (40 mg total) by mouth daily. Patient not taking: Reported on 11/29/2014 10/05/14   Eugenie Filler, MD  HYDROcodone-acetaminophen (NORCO/VICODIN) 5-325 MG per tablet Take 1 tablet by mouth every 4 (four) hours as needed for moderate pain. Patient not taking: Reported on 11/29/2014 11/06/14   Heath Lark, MD  losartan (COZAAR) 25 MG tablet Take 1 tablet (25 mg total) by mouth daily. Patient not taking: Reported on 11/29/2014 10/05/14   Eugenie Filler, MD  metFORMIN (GLUCOPHAGE) 500 MG tablet Take 1 tablet (500 mg total) by mouth 2 (two) times daily with a meal. Patient not taking: Reported on 11/29/2014 04/16/14   Heath Lark, MD  ondansetron (ZOFRAN) 4 MG tablet Take 1 tablet (4 mg total) by mouth every 6 (six) hours. Patient not taking: Reported on 11/29/2014 10/05/14   Eugenie Filler, MD  predniSONE (DELTASONE) 10 MG tablet TAKE 1 TABLET BY MOUTH EVERY DAY WITH BREAKFAST Patient not taking: Reported on 11/29/2014 11/02/14   Heath Lark, MD  ranitidine (ZANTAC) 75 MG tablet Take 1 tablet (75 mg total) by mouth at bedtime. Patient not taking: Reported on 11/29/2014 07/16/14   Heath Lark, MD  scopolamine (TRANSDERM-SCOP) 1 MG/3DAYS Place 1 patch (1.5 mg total) onto the skin every 3 (three) days. Patient not taking: Reported on 11/29/2014 07/19/14   Heath Lark, MD  thalidomide (THALOMID) 50 MG capsule Take 1 capsule (50 mg total) by mouth at  bedtime. Take with water. Patient not taking: Reported on 11/29/2014 10/29/14   Heath Lark, MD   BP 149/51 mmHg  Pulse 89  Temp(Src) 97.5 F (36.4 C) (Oral)  Resp 29  SpO2 100%   Physical Exam  General: Well-developed, well-nourished female in no acute distress; appearance consistent with age of record HENT: normocephalic; atraumatic Eyes: pupils equal, round and reactive to light; extraocular muscles intact; lens implants Neck: supple Heart: regular rate and rhythm Lungs: clear to auscultation bilaterally except a few basilar rales bilaterally Abdomen: soft; nondistended; nontender; no masses or hepatosplenomegaly; bowel sounds present Extremities: No deformity; full range of motion; pulses normal; +1 edema of lower legs Neurologic: Awake, alert and oriented; motor function intact in all extremities and symmetric; no facial droop Skin: Warm and dry Psychiatric: Normal mood and affect   ED Course  Procedures (including critical care time)  CRITICAL CARE Performed by: BMWUXL,KGMW L Total critical care time: 30 minutes Critical care time was exclusive of separately billable procedures and treating other patients. Critical care was necessary to treat or prevent imminent or life-threatening deterioration. Critical care was time spent personally by me on the following activities: development of treatment plan with patient and/or surrogate as well as nursing, discussions with consultants, evaluation of patient's response to treatment, examination of patient, obtaining history from patient or surrogate, ordering and performing treatments and interventions, ordering and review of laboratory studies, ordering and review of radiographic studies, pulse oximetry and re-evaluation of patient's condition.   MDM   Nursing notes and vitals signs, including pulse oximetry, reviewed.  Summary of this visit's results, reviewed by myself:   EKG Interpretation  Date/Time:  Thursday November 29 2014 23:43:07 EST Ventricular Rate:  82 PR Interval:  193 QRS Duration: 91 QT Interval:  418 QTC Calculation: 488 R Axis:   0 Text Interpretation:  Sinus rhythm Abnormal T, consider ischemia, lateral leads Artifact Confirmed by Amberrose Friebel  MD, Jenny Reichmann ([id number]) on 11/29/2014 11:47:55 PM       Labs:  Results for orders placed or performed during the hospital encounter of 11/29/14 (from the past 24 hour(s))  CBC with Differential     Status: Abnormal   Collection Time: 11/29/14 10:43 PM  Result Value Ref Range   WBC 34.5 (H) 4.0 - 10.5 K/uL   RBC 1.63 (L) 3.87 - 5.11 MIL/uL   Hemoglobin 4.2 (LL) 12.0 - 15.0 g/dL   HCT 14.4 (L) 36.0 - 46.0 %   MCV 88.3 78.0 - 100.0 fL   MCH 25.8 (L) 26.0 - 34.0 pg   MCHC 29.2 (L) 30.0 - 36.0 g/dL   RDW 15.3 11.5 - 15.5 %   Platelets 191 150 - 400 K/uL   Neutrophils Relative % 71 43 - 77 %   Lymphocytes Relative 19 12 - 46 %   Monocytes Relative 5 3 - 12 %   Eosinophils Relative 4 0 - 5 %   Basophils Relative 1 0 - 1 %   Neutro Abs 24.5 (H) 1.7 - 7.7 K/uL   Lymphs Abs 6.6 (H) 0.7 - 4.0 K/uL   Monocytes Absolute 1.7 (H) 0.1 - 1.0 K/uL   Eosinophils Absolute 1.4 (H) 0.0 - 0.7 K/uL   Basophils Absolute 0.3 (H) 0.0 - 0.1 K/uL   WBC Morphology VACUOLATED NEUTROPHILS    Smear Review LARGE PLATELETS PRESENT   Comprehensive metabolic panel     Status: Abnormal   Collection Time: 11/29/14 10:43 PM  Result Value Ref Range   Sodium 135 135 - 145 mmol/L   Potassium 4.1 3.5 - 5.1 mmol/L   Chloride 99 96 - 112 mmol/L   CO2 7 (LL) 19 - 32 mmol/L   Glucose, Bld 332 (H) 70 - 99 mg/dL   BUN 45 (H) 6 - 23 mg/dL   Creatinine, Ser 1.69 (H) 0.50 - 1.10 mg/dL   Calcium 8.8 8.4 - 10.5 mg/dL   Total Protein 6.1 6.0 - 8.3 g/dL   Albumin 3.3 (L) 3.5 - 5.2 g/dL   AST 184 (H) 0 - 37 U/L   ALT 282 (H) 0 - 35 U/L   Alkaline Phosphatase 77 39 - 117 U/L   Total Bilirubin 1.5 (H) 0.3 - 1.2 mg/dL   GFR calc non Af Amer 26 (L) >90 mL/min   GFR calc Af Amer 30 (L) >90 mL/min  Anion gap 29 (H) 5 - 15  Type and screen     Status: None (Preliminary result)   Collection Time: 11/30/14 12:34 AM  Result Value Ref Range   ABO/RH(D) A POS    Antibody Screen PENDING    Sample Expiration 12/03/2014   Prepare RBC     Status: None   Collection Time: 11/30/14 12:34 AM  Result Value Ref Range   Order Confirmation ORDER PROCESSED BY BLOOD BANK     Imaging Studies: Dg Chest Portable 1 View  11/29/2014   CLINICAL DATA:  Shortness of breath for 1 day.  EXAM: PORTABLE CHEST - 1 VIEW  COMPARISON:  09/30/2014  FINDINGS: Tip of the right chest port in the SVC. There is stable cardiomegaly. Vascular congestion is unchanged. Question mild pulmonary edema. No confluent airspace disease. Previous pleural effusions have diminished in near completely resolved. No pneumothorax. Stable osseous structures.  IMPRESSION: Stable cardiomegaly and vascular congestion. Question mild pulmonary edema. The previous pleural effusions have diminished and are near completely resolved.   Electronically Signed   By: Jeb Levering M.D.   On: 11/29/2014 23:25   1:55 AM 2 units of packed red blood cells ordered. The patient does wish to be a DO NOT RESUSCITATE but is willing to have a blood transfusion as a comfort measure.     Wynetta Fines, MD 11/30/14 [phone]

## 2014-11-29 NOTE — ED Notes (Signed)
Bed: WA14 Expected date:  Expected time:  Means of arrival:  Comments: EMS 

## 2014-11-30 ENCOUNTER — Inpatient Hospital Stay (HOSPITAL_COMMUNITY)

## 2014-11-30 ENCOUNTER — Telehealth: Payer: Self-pay | Admitting: *Deleted

## 2014-11-30 DIAGNOSIS — Z79891 Long term (current) use of opiate analgesic: Secondary | ICD-10-CM | POA: Diagnosis not present

## 2014-11-30 DIAGNOSIS — I129 Hypertensive chronic kidney disease with stage 1 through stage 4 chronic kidney disease, or unspecified chronic kidney disease: Secondary | ICD-10-CM | POA: Diagnosis present

## 2014-11-30 DIAGNOSIS — Z7982 Long term (current) use of aspirin: Secondary | ICD-10-CM | POA: Diagnosis not present

## 2014-11-30 DIAGNOSIS — N179 Acute kidney failure, unspecified: Secondary | ICD-10-CM | POA: Diagnosis present

## 2014-11-30 DIAGNOSIS — R6510 Systemic inflammatory response syndrome (SIRS) of non-infectious origin without acute organ dysfunction: Secondary | ICD-10-CM | POA: Diagnosis present

## 2014-11-30 DIAGNOSIS — D46C Myelodysplastic syndrome with isolated del(5q) chromosomal abnormality: Principal | ICD-10-CM

## 2014-11-30 DIAGNOSIS — E1122 Type 2 diabetes mellitus with diabetic chronic kidney disease: Secondary | ICD-10-CM | POA: Diagnosis present

## 2014-11-30 DIAGNOSIS — Z8249 Family history of ischemic heart disease and other diseases of the circulatory system: Secondary | ICD-10-CM | POA: Diagnosis not present

## 2014-11-30 DIAGNOSIS — K830 Cholangitis: Secondary | ICD-10-CM | POA: Diagnosis present

## 2014-11-30 DIAGNOSIS — Z885 Allergy status to narcotic agent status: Secondary | ICD-10-CM | POA: Diagnosis not present

## 2014-11-30 DIAGNOSIS — N183 Chronic kidney disease, stage 3 (moderate): Secondary | ICD-10-CM

## 2014-11-30 DIAGNOSIS — Z515 Encounter for palliative care: Secondary | ICD-10-CM | POA: Diagnosis not present

## 2014-11-30 DIAGNOSIS — R945 Abnormal results of liver function studies: Secondary | ICD-10-CM

## 2014-11-30 DIAGNOSIS — E780 Pure hypercholesterolemia: Secondary | ICD-10-CM | POA: Diagnosis present

## 2014-11-30 DIAGNOSIS — Z800 Family history of malignant neoplasm of digestive organs: Secondary | ICD-10-CM | POA: Diagnosis not present

## 2014-11-30 DIAGNOSIS — D696 Thrombocytopenia, unspecified: Secondary | ICD-10-CM | POA: Diagnosis present

## 2014-11-30 DIAGNOSIS — R0602 Shortness of breath: Secondary | ICD-10-CM | POA: Diagnosis present

## 2014-11-30 DIAGNOSIS — E872 Acidosis, unspecified: Secondary | ICD-10-CM | POA: Diagnosis present

## 2014-11-30 DIAGNOSIS — D630 Anemia in neoplastic disease: Secondary | ICD-10-CM

## 2014-11-30 DIAGNOSIS — N189 Chronic kidney disease, unspecified: Secondary | ICD-10-CM

## 2014-11-30 DIAGNOSIS — R7989 Other specified abnormal findings of blood chemistry: Secondary | ICD-10-CM | POA: Diagnosis present

## 2014-11-30 DIAGNOSIS — Z66 Do not resuscitate: Secondary | ICD-10-CM | POA: Diagnosis present

## 2014-11-30 DIAGNOSIS — R1110 Vomiting, unspecified: Secondary | ICD-10-CM

## 2014-11-30 DIAGNOSIS — I5043 Acute on chronic combined systolic (congestive) and diastolic (congestive) heart failure: Secondary | ICD-10-CM | POA: Diagnosis present

## 2014-11-30 DIAGNOSIS — Z7952 Long term (current) use of systemic steroids: Secondary | ICD-10-CM | POA: Diagnosis not present

## 2014-11-30 DIAGNOSIS — E1129 Type 2 diabetes mellitus with other diabetic kidney complication: Secondary | ICD-10-CM

## 2014-11-30 DIAGNOSIS — D649 Anemia, unspecified: Secondary | ICD-10-CM | POA: Diagnosis present

## 2014-11-30 DIAGNOSIS — A419 Sepsis, unspecified organism: Secondary | ICD-10-CM

## 2014-11-30 DIAGNOSIS — Z79899 Other long term (current) drug therapy: Secondary | ICD-10-CM | POA: Diagnosis not present

## 2014-11-30 DIAGNOSIS — Z888 Allergy status to other drugs, medicaments and biological substances status: Secondary | ICD-10-CM | POA: Diagnosis not present

## 2014-11-30 DIAGNOSIS — Z884 Allergy status to anesthetic agent status: Secondary | ICD-10-CM | POA: Diagnosis not present

## 2014-11-30 LAB — BLOOD GAS, VENOUS
Bicarbonate: 14.2 mEq/L — ABNORMAL LOW (ref 20.0–24.0)
FIO2: 0.28 %
O2 SAT: 53.9 %
PATIENT TEMPERATURE: 97.5
PH VEN: 7.298 (ref 7.250–7.300)
pCO2, Ven: 29.7 mmHg — ABNORMAL LOW (ref 45.0–50.0)

## 2014-11-30 LAB — URINALYSIS W MICROSCOPIC (NOT AT ARMC)
Bilirubin Urine: NEGATIVE
Glucose, UA: NEGATIVE mg/dL
KETONES UR: NEGATIVE mg/dL
Leukocytes, UA: NEGATIVE
Nitrite: NEGATIVE
Protein, ur: 100 mg/dL — AB
Specific Gravity, Urine: 1.016 (ref 1.005–1.030)
UROBILINOGEN UA: 1 mg/dL (ref 0.0–1.0)
pH: 5 (ref 5.0–8.0)

## 2014-11-30 LAB — CBC
HEMATOCRIT: 21.6 % — AB (ref 36.0–46.0)
HEMOGLOBIN: 7.5 g/dL — AB (ref 12.0–15.0)
MCH: 29.2 pg (ref 26.0–34.0)
MCHC: 34.7 g/dL (ref 30.0–36.0)
MCV: 84 fL (ref 78.0–100.0)
Platelets: 118 10*3/uL — ABNORMAL LOW (ref 150–400)
RBC: 2.57 MIL/uL — ABNORMAL LOW (ref 3.87–5.11)
RDW: 15.4 % (ref 11.5–15.5)
WBC: 36 10*3/uL — ABNORMAL HIGH (ref 4.0–10.5)

## 2014-11-30 LAB — CBC WITH DIFFERENTIAL/PLATELET
BASOS ABS: 0.3 10*3/uL — AB (ref 0.0–0.1)
Basophils Relative: 1 % (ref 0–1)
Eosinophils Absolute: 1.4 10*3/uL — ABNORMAL HIGH (ref 0.0–0.7)
Eosinophils Relative: 4 % (ref 0–5)
HCT: 14.4 % — ABNORMAL LOW (ref 36.0–46.0)
Hemoglobin: 4.2 g/dL — CL (ref 12.0–15.0)
Lymphocytes Relative: 19 % (ref 12–46)
Lymphs Abs: 6.6 10*3/uL — ABNORMAL HIGH (ref 0.7–4.0)
MCH: 25.8 pg — ABNORMAL LOW (ref 26.0–34.0)
MCHC: 29.2 g/dL — AB (ref 30.0–36.0)
MCV: 88.3 fL (ref 78.0–100.0)
Monocytes Absolute: 1.7 10*3/uL — ABNORMAL HIGH (ref 0.1–1.0)
Monocytes Relative: 5 % (ref 3–12)
NEUTROS ABS: 24.5 10*3/uL — AB (ref 1.7–7.7)
Neutrophils Relative %: 71 % (ref 43–77)
Platelets: 191 10*3/uL (ref 150–400)
RBC: 1.63 MIL/uL — ABNORMAL LOW (ref 3.87–5.11)
RDW: 15.3 % (ref 11.5–15.5)
WBC: 34.5 10*3/uL — ABNORMAL HIGH (ref 4.0–10.5)

## 2014-11-30 LAB — PREPARE RBC (CROSSMATCH)

## 2014-11-30 LAB — GLUCOSE, CAPILLARY
GLUCOSE-CAPILLARY: 149 mg/dL — AB (ref 70–99)
Glucose-Capillary: 177 mg/dL — ABNORMAL HIGH (ref 70–99)
Glucose-Capillary: 149 mg/dL — ABNORMAL HIGH (ref 70–99)
Glucose-Capillary: 186 mg/dL — ABNORMAL HIGH (ref 70–99)
Glucose-Capillary: 191 mg/dL — ABNORMAL HIGH (ref 70–99)
Glucose-Capillary: 139 mg/dL — ABNORMAL HIGH (ref 70–99)

## 2014-11-30 LAB — CLOSTRIDIUM DIFFICILE BY PCR: Toxigenic C. Difficile by PCR: NEGATIVE

## 2014-11-30 LAB — TROPONIN I: TROPONIN I: 0.13 ng/mL — AB (ref <0.031)

## 2014-11-30 LAB — BRAIN NATRIURETIC PEPTIDE: B Natriuretic Peptide: 3223.3 pg/mL — ABNORMAL HIGH (ref 0.0–100.0)

## 2014-11-30 LAB — I-STAT CG4 LACTIC ACID, ED: LACTIC ACID, VENOUS: 12.46 mmol/L — AB (ref 0.5–2.0)

## 2014-11-30 LAB — MRSA PCR SCREENING: MRSA by PCR: NEGATIVE

## 2014-11-30 MED ORDER — ACETAMINOPHEN 325 MG PO TABS
650.0000 mg | ORAL_TABLET | Freq: Four times a day (QID) | ORAL | Status: DC | PRN
  Administered 2014-11-30 – 2014-12-04 (×4): 650 mg via ORAL
  Filled 2014-11-30 (×4): qty 2

## 2014-11-30 MED ORDER — DIPHENHYDRAMINE HCL 25 MG PO CAPS
25.0000 mg | ORAL_CAPSULE | Freq: Four times a day (QID) | ORAL | Status: DC | PRN
  Administered 2014-11-30 (×2): 25 mg via ORAL
  Filled 2014-11-30 (×2): qty 1

## 2014-11-30 MED ORDER — INSULIN ASPART 100 UNIT/ML ~~LOC~~ SOLN
0.0000 [IU] | Freq: Three times a day (TID) | SUBCUTANEOUS | Status: DC
  Administered 2014-11-30 (×2): 3 [IU] via SUBCUTANEOUS
  Administered 2014-11-30: 2 [IU] via SUBCUTANEOUS
  Administered 2014-12-01: 3 [IU] via SUBCUTANEOUS
  Administered 2014-12-01: 2 [IU] via SUBCUTANEOUS
  Administered 2014-12-02: 3 [IU] via SUBCUTANEOUS
  Administered 2014-12-02: 2 [IU] via SUBCUTANEOUS
  Administered 2014-12-03: 3 [IU] via SUBCUTANEOUS
  Administered 2014-12-03: 2 [IU] via SUBCUTANEOUS
  Administered 2014-12-03: 5 [IU] via SUBCUTANEOUS
  Administered 2014-12-04 (×2): 3 [IU] via SUBCUTANEOUS

## 2014-11-30 MED ORDER — FUROSEMIDE 10 MG/ML IJ SOLN
20.0000 mg | Freq: Once | INTRAMUSCULAR | Status: AC
  Administered 2014-11-30: 20 mg via INTRAVENOUS
  Filled 2014-11-30: qty 2

## 2014-11-30 MED ORDER — LOPERAMIDE HCL 2 MG PO CAPS
2.0000 mg | ORAL_CAPSULE | Freq: Two times a day (BID) | ORAL | Status: DC | PRN
  Administered 2014-11-30 – 2014-12-05 (×2): 2 mg via ORAL
  Filled 2014-11-30 (×2): qty 1

## 2014-11-30 MED ORDER — SODIUM CHLORIDE 0.9 % IV SOLN: Freq: Once | INTRAVENOUS | Status: DC

## 2014-11-30 MED ORDER — ONDANSETRON HCL 4 MG/2ML IJ SOLN
4.0000 mg | Freq: Four times a day (QID) | INTRAMUSCULAR | Status: DC | PRN
  Administered 2014-11-30 – 2014-12-02 (×4): 4 mg via INTRAVENOUS
  Filled 2014-11-30 (×4): qty 2

## 2014-11-30 MED ORDER — METRONIDAZOLE IN NACL 5-0.79 MG/ML-% IV SOLN
500.0000 mg | Freq: Three times a day (TID) | INTRAVENOUS | Status: DC
  Administered 2014-11-30 – 2014-12-01 (×4): 500 mg via INTRAVENOUS
  Filled 2014-11-30 (×4): qty 100

## 2014-11-30 MED ORDER — FUROSEMIDE 40 MG PO TABS
40.0000 mg | ORAL_TABLET | Freq: Every day | ORAL | Status: DC
  Administered 2014-11-30: 40 mg via ORAL
  Filled 2014-11-30: qty 1

## 2014-11-30 MED ORDER — SODIUM CHLORIDE 0.9 % IJ SOLN
3.0000 mL | Freq: Two times a day (BID) | INTRAMUSCULAR | Status: DC
  Administered 2014-12-01 – 2014-12-05 (×6): 3 mL via INTRAVENOUS

## 2014-11-30 MED ORDER — ONDANSETRON HCL 4 MG PO TABS: 4.0000 mg | ORAL_TABLET | Freq: Four times a day (QID) | ORAL | Status: DC | PRN

## 2014-11-30 MED ORDER — METOPROLOL SUCCINATE ER 100 MG PO TB24
100.0000 mg | ORAL_TABLET | Freq: Every day | ORAL | Status: DC
  Administered 2014-11-30 – 2014-12-05 (×6): 100 mg via ORAL
  Filled 2014-11-30 (×2): qty 1
  Filled 2014-11-30: qty 4
  Filled 2014-11-30 (×2): qty 1
  Filled 2014-11-30: qty 4

## 2014-11-30 MED ORDER — INSULIN ASPART 100 UNIT/ML ~~LOC~~ SOLN
0.0000 [IU] | Freq: Every day | SUBCUTANEOUS | Status: DC
  Administered 2014-12-04: 2 [IU] via SUBCUTANEOUS

## 2014-11-30 MED ORDER — HYDRALAZINE HCL 20 MG/ML IJ SOLN
5.0000 mg | INTRAMUSCULAR | Status: DC | PRN
  Administered 2014-11-30: 5 mg via INTRAVENOUS
  Filled 2014-11-30: qty 1

## 2014-11-30 MED ORDER — CIPROFLOXACIN IN D5W 400 MG/200ML IV SOLN
400.0000 mg | INTRAVENOUS | Status: DC
  Administered 2014-11-30: 400 mg via INTRAVENOUS
  Filled 2014-11-30: qty 200

## 2014-11-30 MED ORDER — PREDNISONE 5 MG PO TABS
12.5000 mg | ORAL_TABLET | Freq: Every day | ORAL | Status: DC
  Administered 2014-12-01 – 2014-12-05 (×5): 12.5 mg via ORAL
  Filled 2014-11-30: qty 2.5
  Filled 2014-11-30: qty 1
  Filled 2014-11-30: qty 2.5
  Filled 2014-11-30: qty 1
  Filled 2014-11-30 (×3): qty 2.5

## 2014-11-30 MED ORDER — CETYLPYRIDINIUM CHLORIDE 0.05 % MT LIQD
7.0000 mL | Freq: Two times a day (BID) | OROMUCOSAL | Status: DC
  Administered 2014-11-30 – 2014-12-05 (×11): 7 mL via OROMUCOSAL

## 2014-11-30 MED ORDER — MORPHINE SULFATE 2 MG/ML IJ SOLN
2.0000 mg | INTRAMUSCULAR | Status: DC | PRN
  Administered 2014-11-30 (×3): 2 mg via INTRAVENOUS
  Filled 2014-11-30 (×3): qty 1

## 2014-11-30 MED ORDER — CIPROFLOXACIN IN D5W 400 MG/200ML IV SOLN: 400.0000 mg | Freq: Two times a day (BID) | INTRAVENOUS | Status: DC

## 2014-11-30 MED ORDER — HYDRALAZINE HCL 20 MG/ML IJ SOLN
10.0000 mg | Freq: Once | INTRAMUSCULAR | Status: AC
  Administered 2014-11-30: 10 mg via INTRAVENOUS
  Filled 2014-11-30: qty 1

## 2014-11-30 MED ORDER — ONDANSETRON HCL 4 MG/2ML IJ SOLN
4.0000 mg | Freq: Once | INTRAMUSCULAR | Status: AC
  Administered 2014-11-30: 4 mg via INTRAVENOUS
  Filled 2014-11-30: qty 2

## 2014-11-30 NOTE — Telephone Encounter (Signed)
Flo Shanks, nurse with Hospice called to inform Dr. Alvy Bimler that patient is currently admitted to [facility].  Will route to Dr. Robyne Askew

## 2014-11-30 NOTE — Progress Notes (Signed)
Inpatient RN visit- [facility] 2W ICU stepdown Room [facility] RN Visit-Karen Alford Highland RN  Related admission to [facility] diagnosis of Myelodysplastic syndrome.   Pt is DNR code.    Patient to [facility] ED via EMS on 2/25 after family attempted to manage nausea/vomiting and new onset chest/back pain at home with tylenol and compazine. [facility] was notified prior to transfer. Patient required oxygen on EMS arrival for decreased O2 sats of 76% on room air, she is now recieving oxygen via nasal cannula @ 2 liters with sats of 99-100%.  Per chart review and staff RN Colletta Maryland patient has recvd 1 unit of blood and will recv 1 more, Hb 4.2 on admission. She  has required 2 doses of IV morphine for abdominal pain and dyspnea as well as 2 doses of IV zofran for nausea.  Foley catheter to be placed for urinary retention as patient has not voided since yesterday evening at home. Patient seen at bedside, son Delfino Lovett and great grandson present in the room, Probation officer spoke with patient's daughter Terrence Dupont out in the hall way. Emma related the events that lead to her calling EMS and how frightened she was by the symptoms her mother was exhibiting. Emotional support and reassurance offered. Patient lying in bed, appeared pale, alert/oriented and interactive. She reported having some abdominal pain, pain medication given by staff RN just prior to writers visit. She continues with incontinent liquid stools, C diff results pending. Appetite poor, bites/sips, ice chips. Discussed possible discharge needs, including oxygen, patient and her son Delfino Lovett voiced understanding. Patient reports p[oor sleep last night and was hoping to rest after writers' visit. [facility] will continue to follow. Patient's home medication list, transfer summary and OOF DNR in place on shadow chart.  Please call [facility] @ [phone]-  with any hospice needs.   Thank you. Flo Shanks, RN, BSN, [place] Liaison  [phone])

## 2014-11-30 NOTE — ED Notes (Signed)
Pt. i-stat Lactic Acid 12.46. RN, Leone Haven made aware.

## 2014-11-30 NOTE — H&P (Signed)
[facility] History and Physical  Patient: Rebekah Cross  MRN: [id number]  DOB: 08/16/1925  DOS: the patient was seen and examined on 11/30/2014 PCP: Mayra Neer, MD  Chief Complaint: Fatigue  HPI: Rebekah Cross is a 81 y.o. 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 The patient is presenting with complaints of progressively worsening shortness of breath and fatigue. Patient mentions she was at her baseline although a week ago and suddenly started having complaints of progressively worsening shortness of breath and fatigue. She denies any active bleeding. Since last 2 days she also has nausea vomiting as well as lose episode of bowel. She denies any active bleeding or black color bowel movement or any blood in her vomitus as well. Denies any fever other than today. Denies any chills. Denies any chest pain or chest tightness or shortness of breath at the time of my evaluation. She also had complaints of some slurred speech and left-sided weakness which is also does a lot of the time of my evaluation. She denies any burning urination and has been urinating less than her usual. She was on Lasix which was stopped by hospice. She was also on oral hypoglycemic agent which was also stopped by hospice.  The patient is coming from home And at her baseline independent for most of her ADL.  Review of Systems: as mentioned in the history of present illness.  A Comprehensive review of the other systems is negative.  Past Medical History  Diagnosis Date  . Anemia, unspecified   . Hypertension   . Thyroid disease   . Hypercholesteremia   . MDS (myelodysplastic syndrome) with 5q deletion 07/11/2013  . Psoriasis 03/19/2014  . Type II or unspecified type diabetes mellitus without mention  of complication, not stated as uncontrolled 04/16/2014  . Itching 05/21/2014  . Abdominal pain, acute, left upper quadrant 07/16/2014  . Bilateral leg pain 11/05/2014   Past Surgical History  Procedure Laterality Date  . Abdominal hysterectomy    . Back surgery    . Arthroscopy right knee    . Arthroscopy left knee    . Cataract surgery     Social History:  reports that she has never smoked. She has never used smokeless tobacco. She reports that she does not drink alcohol or use illicit drugs.  Allergies  Allergen Reactions  . Novocain [Procaine] Swelling and Rash  . Oxycodone Nausea And Vomiting and Other (See Comments)    "jerking"  . Codeine Nausea And Vomiting and Rash    unknown  . Methimazole [Thiamazole] Rash  . Revlimid [Lenalidomide] Rash    Family History  Problem Relation Age of Onset  . Colon cancer Daughter   . Heart disease Maternal Grandmother   . Heart disease Paternal Grandfather     Prior to Admission medications   Medication Sig Start Date End Date Taking? Authorizing Provider  acetaminophen (TYLENOL) 500 MG tablet Take 1,000 mg by mouth every 6 (six) hours as needed for fever (fever).    Yes Historical Provider, MD  aspirin 81 MG tablet Take 81 mg by mouth daily.   Yes Historical Provider, MD  desoximetasone (TOPICORT) 0.25 % cream Apply 1 application topically 2 (two) times daily as needed. For rash   Yes Historical Provider, MD  hydrOXYzine (VISTARIL) 25 MG capsule Take 1 capsule (25 mg total) by mouth every  4 (four) hours as needed. Patient taking differently: Take 25 mg by mouth 2 (two) times daily.  05/21/14  Yes Heath Lark, MD  LORazepam (ATIVAN) 0.5 MG tablet Take 1 mg by mouth at bedtime.    Yes Historical Provider, MD  metoprolol succinate (TOPROL-XL) 100 MG 24 hr tablet Take 50 mg by mouth every morning.    Yes Historical Provider, MD  olmesartan-hydrochlorothiazide (BENICAR HCT) 40-25 MG per tablet Take 1 tablet by mouth daily.   Yes Historical  Provider, MD  omeprazole (PRILOSEC) 40 MG capsule Take 1 capsule (40 mg total) by mouth daily. Patient taking differently: Take 40 mg by mouth 2 (two) times daily.  07/16/14  Yes Heath Lark, MD  ondansetron (ZOFRAN) 8 MG tablet Take 1 tablet (8 mg total) by mouth 2 (two) times daily as needed for nausea or vomiting. 09/24/14  Yes Heath Lark, MD  potassium chloride SA (K-DUR,KLOR-CON) 20 MEQ tablet Take 1 tablet (20 mEq total) by mouth daily. 10/05/14  Yes Eugenie Filler, MD  predniSONE (DELTASONE) 2.5 MG tablet Take 12.5 mg by mouth daily with breakfast. Take 5 tablets (12.5 mg) daily.   Yes Historical Provider, MD  prochlorperazine (COMPAZINE) 10 MG tablet Take 10 mg by mouth daily.  07/03/14  Yes Historical Provider, MD  senna (SENOKOT) 8.6 MG TABS tablet Take 1 tablet (8.6 mg total) by mouth daily as needed for mild constipation. Patient taking differently: Take 1-2 tablets by mouth 2 (two) times daily. Take 1 tablet in the am & take 2 tablets in the pm. 11/15/14  Yes Heath Lark, MD  senna-docusate (SENOKOT-S) 8.6-50 MG per tablet Take 1 tablet by mouth 2 (two) times daily.   Yes Historical Provider, MD  traMADol (ULTRAM) 50 MG tablet Take 50 mg by mouth 2 (two) times daily.  04/19/14  Yes Historical Provider, MD  furosemide (LASIX) 40 MG tablet Take 1 tablet (40 mg total) by mouth daily. Patient not taking: Reported on 11/29/2014 10/05/14   Eugenie Filler, MD  HYDROcodone-acetaminophen (NORCO/VICODIN) 5-325 MG per tablet Take 1 tablet by mouth every 4 (four) hours as needed for moderate pain. Patient not taking: Reported on 11/29/2014 11/06/14   Heath Lark, MD  losartan (COZAAR) 25 MG tablet Take 1 tablet (25 mg total) by mouth daily. Patient not taking: Reported on 11/29/2014 10/05/14   Eugenie Filler, MD  metFORMIN (GLUCOPHAGE) 500 MG tablet Take 1 tablet (500 mg total) by mouth 2 (two) times daily with a meal. Patient not taking: Reported on 11/29/2014 04/16/14   Heath Lark, MD  ondansetron  (ZOFRAN) 4 MG tablet Take 1 tablet (4 mg total) by mouth every 6 (six) hours. Patient not taking: Reported on 11/29/2014 10/05/14   Eugenie Filler, MD  predniSONE (DELTASONE) 10 MG tablet TAKE 1 TABLET BY MOUTH EVERY DAY WITH BREAKFAST Patient not taking: Reported on 11/29/2014 11/02/14   Heath Lark, MD  ranitidine (ZANTAC) 75 MG tablet Take 1 tablet (75 mg total) by mouth at bedtime. Patient not taking: Reported on 11/29/2014 07/16/14   Heath Lark, MD  scopolamine (TRANSDERM-SCOP) 1 MG/3DAYS Place 1 patch (1.5 mg total) onto the skin every 3 (three) days. Patient not taking: Reported on 11/29/2014 07/19/14   Heath Lark, MD  thalidomide (THALOMID) 50 MG capsule Take 1 capsule (50 mg total) by mouth at bedtime. Take with water. Patient not taking: Reported on 11/29/2014 10/29/14   Heath Lark, MD    Physical Exam: Filed Vitals:   11/30/14 0330 11/30/14 0518  11/30/14 0530 11/30/14 0605  BP: 147/32 139/38  144/28  Pulse: 88 84 83   Temp: 98.8 F (37.1 C)   98.6 F (37 C)  TempSrc: Oral   Oral  Resp:  24 17   Height: 5' (1.524 m)     Weight: 57.7 kg (127 lb 3.3 oz)     SpO2:  99% 90%     General: Alert, Awake and Oriented to Time, Place and Person. Appear in mild distress Eyes: PERRL ENT: Oral Mucosa clear moist. Neck: Mild JVD Cardiovascular: S1 and S2 Present, aortic systolic Murmur, Peripheral Pulses Present Respiratory: Bilateral Air entry equal and Decreased, basal Crackles, non wheezes Abdomen: Bowel Sound present, Soft and non tender Skin: no Rash Extremities: Bilateral Pedal edema, no calf tenderness Neurologic: Grossly no focal neuro deficit.  Labs on Admission:  CBC:  Recent Labs Lab 11/29/14 2243  WBC 34.5*  NEUTROABS 24.5*  HGB 4.2*  HCT 14.4*  MCV 88.3  PLT 191    CMP     Component Value Date/Time   NA 135 11/29/2014 2243   NA 147* 11/08/2014 1110   K 4.1 11/29/2014 2243   K 3.3* 11/08/2014 1110   CL 99 11/29/2014 2243   CO2 7* 11/29/2014 2243   CO2 28  11/08/2014 1110   GLUCOSE 332* 11/29/2014 2243   GLUCOSE 140 11/08/2014 1110   BUN 45* 11/29/2014 2243   BUN 55.1* 11/08/2014 1110   CREATININE 1.69* 11/29/2014 2243   CREATININE 1.6* 11/08/2014 1110   CALCIUM 8.8 11/29/2014 2243   CALCIUM 9.4 11/08/2014 1110   PROT 6.1 11/29/2014 2243   PROT 6.9 11/05/2014 1028   ALBUMIN 3.3* 11/29/2014 2243   ALBUMIN 4.1 11/05/2014 1028   AST 184* 11/29/2014 2243   AST 16 11/05/2014 1028   ALT 282* 11/29/2014 2243   ALT 53 11/05/2014 1028   ALKPHOS 77 11/29/2014 2243   ALKPHOS 39* 11/05/2014 1028   BILITOT 1.5* 11/29/2014 2243   BILITOT 0.85 11/05/2014 1028   GFRNONAA 26* 11/29/2014 2243   GFRAA 30* 11/29/2014 2243    No results for input(s): LIPASE, AMYLASE in the last 168 hours.   Recent Labs Lab 11/30/14 0034  TROPONINI 0.13*   BNP (last 3 results)  Recent Labs  10/01/14 0414 10/04/14 0450 11/30/14 0034  BNP 888.4* 362.4* 3223.3*    ProBNP (last 3 results) No results for input(s): PROBNP in the last 8760 hours.   Radiological Exams on Admission: Dg Chest Portable 1 View  11/29/2014   CLINICAL DATA:  Shortness of breath for 1 day.  EXAM: PORTABLE CHEST - 1 VIEW  COMPARISON:  09/30/2014  FINDINGS: Tip of the right chest port in the SVC. There is stable cardiomegaly. Vascular congestion is unchanged. Question mild pulmonary edema. No confluent airspace disease. Previous pleural effusions have diminished in near completely resolved. No pneumothorax. Stable osseous structures.  IMPRESSION: Stable cardiomegaly and vascular congestion. Question mild pulmonary edema. The previous pleural effusions have diminished and are near completely resolved.   Electronically Signed   By: Jeb Levering M.D.   On: 11/29/2014 23:25   Dg Abd Portable 2v  11/30/2014   CLINICAL DATA:  Diarrhea.  Mid lower abdominal pain.  EXAM: PORTABLE ABDOMEN - 2 VIEW  COMPARISON:  Abdominal radiographs 10/04/2014.  CT 10/01/2014  FINDINGS: There is no free  intra-abdominal air. Relative paucity of bowel gas. There is air seen within normal caliber small bowel loops in the central pelvis with a few air-fluid levels. Pelvic phleboliths  are seen. The included lung bases are clear.  IMPRESSION: Nonspecific bowel-gas pattern with relative paucity of bowel gas. There a few air-fluid levels in scattered small bowel loops in the pelvis, no significant bowel dilatation. No free air.   Electronically Signed   By: Jeb Levering M.D.   On: 11/30/2014 04:42    EKG: Independently reviewed. normal sinus rhythm, nonspecific ST and T waves changes.  Assessment/Plan Principal Problem:   SIRS (systemic inflammatory response syndrome) Active Problems:   MDS (myelodysplastic syndrome) with 5q deletion   CKD (chronic kidney disease) stage 3, GFR 30-59 ml/min   Diabetes mellitus with renal complications   Acute on chronic renal failure   Intractable vomiting with nausea   Symptomatic anemia   Acute on chronic combined systolic and diastolic heart failure   Lactic acidosis   Elevated LFTs   1. SIRS (systemic inflammatory response syndrome) Symptomatic anemia. Elevated LFT. Acute and chronic kidney disease. Acute on chronic CHF. The patient is presenting with multiple complaints and currently has multiorgan failure. Most likely etiology is symptomatic anemia causing hypoperfusion to multiple organs at the same time. Patient is currently on hospice but family requested her shortness of breath and anemia to be treated. Most likely had anemia secondary to progression of her myelodysplastic syndrome therefore they were visiting of blood transfusion. Monitor H&H. We will involve palliative care and hospice in morning. Family is goals of care is still comfort and if patient's symptoms does not improve with blood transfusion and Lasix then they would prefer going for full comfort measures.  2. Acute on chronic renal failure, acute on chronic CHF. Patient is  presenting with shortness of breath her chest x-ray shows congestion and she looks volume overloaded. Her BMP is elevated from 300 to 3000s. Most likely this is secondary to stopping of her Lasix. We will resume her Lasix and monitor ins and outs.  3. Lactic acidosis. Likely secondary to hypoperfusion to other organs. Currently patient will be receiving blood transfusion and we will recheck her lactic acid level after that.  4. Nausea vomiting and diarrhea. Etiology currently unclear. Patient will have currently been on liquid diet.  5. Diabetes mellitus. Placing the patient on sliding scale.  Advance goals of care discussion: DNR/DNI, if current management does not improve patient's symptoms and family would prefer to go for full comfort care.  Consults: palliative care  DVT Prophylaxis: mechanical compression device. Nutrition: Clear liquid diet  Family Communication: Daughter was present at bedside, opportunity was given to ask question and all questions were answered satisfactorily at the time of interview. Disposition: Admitted to inpatient in step-down unit.  Author: Berle Mull, MD [facility] Pager: [phone] 11/30/2014, 6:31 AM    If 7PM-7AM, please contact night-coverage [email or web address] Password [email or web address]

## 2014-11-30 NOTE — Progress Notes (Signed)
Per Dr. Posey Pronto - May hold the urine collection for now due to patient's new onset of watery stools.

## 2014-11-30 NOTE — Progress Notes (Signed)
Following for any PMT needs- currently followed by [facility] inpatient team. This appears to be a Hospice related admission- Pt. Class needs to be changed to "HOSPICE" not "INPATIENT". Care coordination per hospice team. I am happy to advise on any difficult to control symptoms or assist hospice team with any difficult goals of care needs. Please call [phone] for additional assistance beyond what hospice is already providing.  Lane Hacker, DO Palliative Medicine [phone]

## 2014-11-30 NOTE — ED Notes (Signed)
Patient prefers blood to be pulled from IV. RN made aware.

## 2014-11-30 NOTE — Progress Notes (Signed)
CARE MANAGEMENT NOTE 11/30/2014  Patient:  Rebekah Cross, Rebekah Cross   Account Number:  [id number]  Date Initiated:  11/30/2014  Documentation initiated by:  DAVIS,RHONDA  Subjective/Objective Assessment:   pt with hgb 4.0, wbc over 35 admitted from home     Action/Plan:   currently with home hospice   Anticipated DC Date:  12/03/2014   Anticipated DC Plan:  [place]  In-house referral  NA      DC Planning Services  CM consult      PAC Choice  NA   Choice offered to / List presented to:  NA   DME arranged  NA      DME agency  NA     [place] arranged  NA      [place] agency  NA   Status of service:  In process, will continue to follow Medicare Important Message given?   (If response is "NO", the following Medicare IM given date fields will be blank) Date Medicare IM given:   Medicare IM given by:   Date Additional Medicare IM given:   Additional Medicare IM given by:    Discharge Disposition:    Per UR Regulation:  Reviewed for med. necessity/level of care/duration of stay  If discussed at [place] of Stay Meetings, dates discussed:    Comments:  Feb. 26 2016/Rhonda L. Rosana Hoes, RN, BSN, CCM. Case Management [place] [phone] No discharge needs present of time of review.

## 2014-11-30 NOTE — Progress Notes (Signed)
Patient seen and examined this morning. She was admitted by Dr. Posey Pronto just earlier. She is a very pleasant and relatively functional 81 year old lady diagnosed with MDS, currently on hospice. She came in with profound dyspnea on exertion, diarrhea and poor by mouth intake, was found to be severely anemic with a hemoglobin around 4.  - she will be transfused 2 units of packed red blood cells, followed by repeat CBC - she has significant leukocytosis to 30,000, with diarrhea, I started empirical IV metronidazole for C. difficile pending  - She is a history of systolic and diastolic heart failure, acute on chronic, she will receive 2 units of packed red blood cells as well as fluids, restart her Lasix this morning  - She has mild troponin elevation likely in the setting of demand ischemia due to profound anemia as well as intravascular   dehydration  - She has elevated BNP, she was on Lasix in the past but no longer, she endorses significant weight gain in the past 1-2 weeks - She has lactic acidosis in the setting of hypoperfusion due to profound anemia  - We'll allow clear liquid diet for now, make available anti-emetics, will advance diet as tolerated  - I discussed with the family regarding goals of care, they wish to reverse things that can be reversed such as infection/anemia. Prior to this, even if she was on hospice, she was functional and ambulatory without assistance. Hospice/palliative was consulted by the admitting physician.   On exam she appears quite frail, pale  CV: RRR, trace LE edema Lungs: CTA on anterior auscultation Abd: diffuse discomfort with palpation  Costin M. Cruzita Lederer, MD [facility] [phone]

## 2014-12-01 ENCOUNTER — Inpatient Hospital Stay (HOSPITAL_COMMUNITY)

## 2014-12-01 DIAGNOSIS — Z7189 Other specified counseling: Secondary | ICD-10-CM

## 2014-12-01 LAB — TYPE AND SCREEN
ABO/RH(D): A POS
Antibody Screen: NEGATIVE
UNIT DIVISION: 0
Unit division: 0
Unit division: 0

## 2014-12-01 LAB — COMPREHENSIVE METABOLIC PANEL
ALT: 846 U/L — AB (ref 0–35)
AST: 441 U/L — AB (ref 0–37)
Albumin: 2.8 g/dL — ABNORMAL LOW (ref 3.5–5.2)
Alkaline Phosphatase: 85 U/L (ref 39–117)
Anion gap: 10 (ref 5–15)
BILIRUBIN TOTAL: 3.7 mg/dL — AB (ref 0.3–1.2)
BUN: 57 mg/dL — ABNORMAL HIGH (ref 6–23)
CO2: 22 mmol/L (ref 19–32)
Calcium: 7.8 mg/dL — ABNORMAL LOW (ref 8.4–10.5)
Chloride: 104 mmol/L (ref 96–112)
Creatinine, Ser: 2.04 mg/dL — ABNORMAL HIGH (ref 0.50–1.10)
GFR calc non Af Amer: 21 mL/min — ABNORMAL LOW (ref >90)
GFR, EST AFRICAN AMERICAN: 24 mL/min — AB (ref >90)
Glucose, Bld: 111 mg/dL — ABNORMAL HIGH (ref 70–99)
POTASSIUM: 3.3 mmol/L — AB (ref 3.5–5.1)
Sodium: 136 mmol/L (ref 135–145)
Total Protein: 5.4 g/dL — ABNORMAL LOW (ref 6.0–8.3)

## 2014-12-01 LAB — GLUCOSE, CAPILLARY
GLUCOSE-CAPILLARY: 152 mg/dL — AB (ref 70–99)
GLUCOSE-CAPILLARY: 135 mg/dL — AB (ref 70–99)
Glucose-Capillary: 111 mg/dL — ABNORMAL HIGH (ref 70–99)
Glucose-Capillary: 147 mg/dL — ABNORMAL HIGH (ref 70–99)

## 2014-12-01 LAB — CBC
HCT: 25.3 % — ABNORMAL LOW (ref 36.0–46.0)
Hemoglobin: 9 g/dL — ABNORMAL LOW (ref 12.0–15.0)
MCH: 29.2 pg (ref 26.0–34.0)
MCHC: 35.6 g/dL (ref 30.0–36.0)
MCV: 82.1 fL (ref 78.0–100.0)
PLATELETS: 105 10*3/uL — AB (ref 150–400)
RBC: 3.08 MIL/uL — ABNORMAL LOW (ref 3.87–5.11)
RDW: 15 % (ref 11.5–15.5)
WBC: 29.1 10*3/uL — ABNORMAL HIGH (ref 4.0–10.5)

## 2014-12-01 MED ORDER — SODIUM CHLORIDE 0.9 % IJ SOLN
10.0000 mL | INTRAMUSCULAR | Status: DC | PRN
  Administered 2014-12-02 – 2014-12-03 (×2): 20 mL
  Administered 2014-12-04 – 2014-12-05 (×3): 10 mL
  Filled 2014-12-01 (×5): qty 40

## 2014-12-01 MED ORDER — POTASSIUM CHLORIDE CRYS ER 10 MEQ PO TBCR
30.0000 meq | EXTENDED_RELEASE_TABLET | Freq: Once | ORAL | Status: AC
  Administered 2014-12-01: 30 meq via ORAL
  Filled 2014-12-01 (×2): qty 1

## 2014-12-01 MED ORDER — PIPERACILLIN-TAZOBACTAM IN DEX 2-0.25 GM/50ML IV SOLN
2.2500 g | Freq: Four times a day (QID) | INTRAVENOUS | Status: DC
  Administered 2014-12-01 – 2014-12-03 (×6): 2.25 g via INTRAVENOUS
  Filled 2014-12-01 (×7): qty 50

## 2014-12-01 MED ORDER — MORPHINE SULFATE 10 MG/5ML PO SOLN
2.5000 mg | ORAL | Status: DC | PRN
Start: 2014-12-01 — End: 2014-12-05
  Administered 2014-12-01 – 2014-12-04 (×6): 2.5 mg via ORAL
  Filled 2014-12-01 (×6): qty 5

## 2014-12-01 MED ORDER — FUROSEMIDE 10 MG/ML IJ SOLN
40.0000 mg | Freq: Once | INTRAMUSCULAR | Status: AC
  Administered 2014-12-01: 40 mg via INTRAVENOUS
  Filled 2014-12-01: qty 4

## 2014-12-01 MED ORDER — PIPERACILLIN-TAZOBACTAM IN DEX 2-0.25 GM/50ML IV SOLN
2.2500 g | Freq: Once | INTRAVENOUS | Status: AC
  Administered 2014-12-01: 2.25 g via INTRAVENOUS
  Filled 2014-12-01: qty 50

## 2014-12-01 NOTE — Progress Notes (Signed)
ANTIBIOTIC CONSULT NOTE - INITIAL  Pharmacy Consult for Zosyn Indication: intra-abdominal infection  Allergies  Allergen Reactions  . Novocain [Procaine] Swelling and Rash  . Oxycodone Nausea And Vomiting and Other (See Comments)    "jerking"  . Codeine Nausea And Vomiting and Rash    unknown  . Methimazole [Thiamazole] Rash  . Revlimid [Lenalidomide] Rash    Patient Measurements: Height: 5\' 3"  (160 cm) Weight: 132 lb 8 oz (60.102 kg) IBW/kg (Calculated) : 52.4  Vital Signs: Temp: 98.1 F (36.7 C) (02/27 1211) Temp Source: Oral (02/27 1211) BP: 179/58 mmHg (02/27 1211) Pulse Rate: 78 (02/27 1211) Intake/Output from previous day: 02/26 0701 - 02/27 0700 In: 9357 [P.O.:180; I.V.:40; Blood:875; IV Piggyback:500] Out: 1950 [Urine:1950] Intake/Output from this shift: Total I/O In: 100 [IV Piggyback:100] Out: 975 [Urine:975]  Labs:  Recent Labs  11/29/14 2243 11/30/14 1717 12/01/14 0820  WBC 34.5* 36.0* 29.1*  HGB 4.2* 7.5* 9.0*  PLT 191 118* 105*  CREATININE 1.69*  --  2.04*   Estimated Creatinine Clearance: 16.1 mL/min (by C-G formula based on Cr of 2.04).  Medical History: Past Medical History  Diagnosis Date  . Anemia, unspecified   . Hypertension   . Thyroid disease   . Hypercholesteremia   . MDS (myelodysplastic syndrome) with 5q deletion 07/11/2013  . Psoriasis 03/19/2014  . Type II or unspecified type diabetes mellitus without mention of complication, not stated as uncontrolled 04/16/2014  . Itching 05/21/2014  . Abdominal pain, acute, left upper quadrant 07/16/2014  . Bilateral leg pain 11/05/2014    Medications:  Scheduled:  . sodium chloride   Intravenous Once  . antiseptic oral rinse  7 mL Mouth Rinse BID  . insulin aspart  0-15 Units Subcutaneous TID WC  . insulin aspart  0-5 Units Subcutaneous QHS  . metoprolol succinate  100 mg Oral Daily  . piperacillin-tazobactam (ZOSYN)  IV  2.25 g Intravenous Q6H  . piperacillin-tazobactam (ZOSYN)  IV   2.25 g Intravenous Once  . potassium chloride  30 mEq Oral Once  . predniSONE  12.5 mg Oral Q breakfast  . sodium chloride  3 mL Intravenous Q12H   Assessment: 89 yoF hospice patient admitted 2/26 from home with ShOB, jaw pain, and back pain. Also with dehydration, loose stools. Originally started on ciprofloxacin and metronidazole for intra-abdominal infection, changing to Zosyn per Pharmacy on 2/27 given elevation in LFTs and bilirubin, ?cholangitis.  Antiinfectives  2/26 >> ciprofloxacin x1 2/26 >> metronidazole >> 2/27 2/27 >> Zosyn >>  Labs / vitals Tmax: afebrile WBCs: 29.1, improved Renal: SCr 2.04 (baseline appears ~0.7), CrCl 16 ml/min CG, 22N Lactic acid: 12.46 (2/26)  Microbiology 2/26 MRSA PCR: negative 2/26 C.diff PCR: negative  Goal of Therapy:  Zosyn per indication and renal function  Plan:  - start Zosyn 2.25gm IV q6h - follow-up clinical course, culture results, renal function - follow-up antibiotic de-escalation and length of therapy  Thank you for the consult.  Currie Paris, PharmD, BCPS Pager: [phone] Pharmacy: [phone] 12/01/2014 2:32 PM

## 2014-12-01 NOTE — Progress Notes (Signed)
Pharmacist Heart Failure Core Measure Documentation  Assessment: Rebekah Cross has an EF documented as 45-50% on 21/29/15 by [name].  Rationale: Heart failure patients with left ventricular systolic dysfunction (LVSD) and an EF < 40% should be prescribed an angiotensin converting enzyme inhibitor (ACEI) or angiotensin receptor blocker (ARB) at discharge unless a contraindication is documented in the medical record.  This patient is not currently on an ACEI or ARB for HF.  This note is being placed in the record in order to provide documentation that a contraindication to the use of these agents is present for this encounter.  ACE Inhibitor or Angiotensin Receptor Blocker is contraindicated (specify all that apply)  []   ACEI allergy AND ARB allergy []   Angioedema []   Moderate or severe aortic stenosis []   Hyperkalemia []   Hypotension []   Renal artery stenosis [x]   Worsening renal function, preexisting renal disease or dysfunction   [name] 12/01/2014 3:50 PM

## 2014-12-01 NOTE — Progress Notes (Signed)
GIP visit- Saydie Gerdts [facility] 1601- [facility] RN  Related admission to [facility] diagnosis of Myelodysplastic syndrome. Pt is a DNR code. Patient was moved to 1434 antibiotics infusing. Family outside the room states that she had 3 units of blood transfused and is doing better today. Patient c/o soreness all over which she has Morphine 2mg  IV, she had 3 doses yesterday. After patient transported to the floor patient was assisted by staff to the chair. Dr. Cruzita Lederer spoke with family about getting PT today and if patient has a good day today patient most likely be able to discharge tomorrow.   Please call [facility] @ [phone] with any hospice needs. Thank you Ardath Sax RN BSN [facility]

## 2014-12-01 NOTE — Progress Notes (Signed)
PROGRESS NOTE   Rebekah Cross FYB:[id number] DOB: Jul 06, 1925 DOA: 11/29/2014 PCP: Mayra Neer, MD  HPI: She is a very pleasant and relatively functional 81 year old lady diagnosed with MDS, currently on hospice. She came in with profound dyspnea on exertion, diarrhea and poor by mouth intake, was found to be severely anemic with a hemoglobin around 4.  Subjective / 24 H Interval events - feeling better this morning   Assessment/Plan: Principal Problem:   SIRS (systemic inflammatory response syndrome) Active Problems:   MDS (myelodysplastic syndrome) with 5q deletion   CKD (chronic kidney disease) stage 3, GFR 30-59 ml/min   Diabetes mellitus with renal complications   Acute on chronic renal failure   Intractable vomiting with nausea   Symptomatic anemia   Acute on chronic combined systolic and diastolic heart failure   Lactic acidosis   Elevated LFTs   Hospice care patient   DNR (do not resuscitate)  Goals of care - I have had extensive discussions today with multiple family members regarding her current condition and prognosis. I discussed with the patient that she has heart failure, renal failure, elevated liver enzymes and bone marrow failure. Her short term prognosis is quite poor; patient and her family have full understanding. The patient wants to understand better why her LFTs are up however understands that she is not a candidate for aggressive and invasive measures should we need them. She agrees with antibiotics and medical management and RUQ [place]. Per patient and family wishes, if her condition gets worse, will switch focus to full comfort. She does not wish to suffer under any circumstances.  Anemia - due to MDS - s/p 3U of pRBC, improved  Enteritis with elevated WBC - C diff negative - empiric Ciprofloxacin and Metronidazole, improving, no loose stools overnight, leukocytosis improving. Switch to Zosyn given elevation in LFTs and bilirubin, ?cholangitis  Elevated  liver function tests - and RUQ pain,  - obtain RUQ [place]  MDS - on hospice  Acute on chronic combined systolic and diastolic heart failure  - with evidence of overload and weight gain at home + 3U blood, continue Lasix  Renal failure - likely due to profound anemia - monitor, stop Lasix after today's dose   Nausea and vomiting  - improving, advance diet  HTN - restarted her home medications  Leukocytosis - improving with antibiotics   Thrombocytopenia - chronic  Diet: Diet full liquid Fluids: none DVT Prophylaxis: SCD  Code Status: DNR Family Communication: d/w multiple family members Disposition Plan: home when ready   Consultants:  None   Procedures:  None    Antibiotics  Anti-infectives    Start     Dose/Rate Route Frequency Ordered Stop   11/30/14 1800  ciprofloxacin (CIPRO) IVPB 400 mg  Status:  Discontinued     400 mg 200 mL/hr over 60 Minutes Intravenous Every 12 hours 11/30/14 1645 11/30/14 1650   11/30/14 1800  ciprofloxacin (CIPRO) IVPB 400 mg     400 mg 200 mL/hr over 60 Minutes Intravenous Every 24 hours 11/30/14 1650     11/30/14 0800  metroNIDAZOLE (FLAGYL) IVPB 500 mg     500 mg 100 mL/hr over 60 Minutes Intravenous Every 8 hours 11/30/14 0708         Studies  Dg Chest Portable 1 View  11/29/2014   CLINICAL DATA:  Shortness of breath for 1 day.  EXAM: PORTABLE CHEST - 1 VIEW  COMPARISON:  09/30/2014  FINDINGS: Tip of the right chest port in the  SVC. There is stable cardiomegaly. Vascular congestion is unchanged. Question mild pulmonary edema. No confluent airspace disease. Previous pleural effusions have diminished in near completely resolved. No pneumothorax. Stable osseous structures.  IMPRESSION: Stable cardiomegaly and vascular congestion. Question mild pulmonary edema. The previous pleural effusions have diminished and are near completely resolved.   Electronically Signed   By: Jeb Levering M.D.   On: 11/29/2014 23:25   Dg Abd Portable  2v  11/30/2014   CLINICAL DATA:  Diarrhea.  Mid lower abdominal pain.  EXAM: PORTABLE ABDOMEN - 2 VIEW  COMPARISON:  Abdominal radiographs 10/04/2014.  CT 10/01/2014  FINDINGS: There is no free intra-abdominal air. Relative paucity of bowel gas. There is air seen within normal caliber small bowel loops in the central pelvis with a few air-fluid levels. Pelvic phleboliths are seen. The included lung bases are clear.  IMPRESSION: Nonspecific bowel-gas pattern with relative paucity of bowel gas. There a few air-fluid levels in scattered small bowel loops in the pelvis, no significant bowel dilatation. No free air.   Electronically Signed   By: Jeb Levering M.D.   On: 11/30/2014 04:42    Objective  Filed Vitals:   12/01/14 0800 12/01/14 1048 12/01/14 1146 12/01/14 1211  BP: 187/46 188/50 164/44 179/58  Pulse: 71  78 78  Temp: 97.9 F (36.6 C)   98.1 F (36.7 C)  TempSrc: Oral   Oral  Resp: 23   24  Height:    5\' 3"  (1.6 m)  Weight:    60.102 kg (132 lb 8 oz)  SpO2: 99%   99%    Intake/Output Summary (Last 24 hours) at 12/01/14 1317 Last data filed at 12/01/14 1058  Gross per 24 hour  Intake   1015 ml  Output   2625 ml  Net  -1610 ml   Filed Weights   11/30/14 0330 12/01/14 0400 12/01/14 1211  Weight: 57.7 kg (127 lb 3.3 oz) 57.7 kg (127 lb 3.3 oz) 60.102 kg (132 lb 8 oz)    Exam:  General:  NAD, ill appearing   HEENT: no scleral icterus  Cardiovascular: RRR  Respiratory: bibasilar crackles  Abdomen: mild tenderness  MSK/Extremities: no cyanosis  Skin: no rashes  Neuro: non focal   Data Reviewed: Basic Metabolic Panel:  Recent Labs Lab 11/29/14 2243 12/01/14 0820  NA 135 136  K 4.1 3.3*  CL 99 104  CO2 7* 22  GLUCOSE 332* 111*  BUN 45* 57*  CREATININE 1.69* 2.04*  CALCIUM 8.8 7.8*   Liver Function Tests:  Recent Labs Lab 11/29/14 2243 12/01/14 0820  AST 184* 441*  ALT 282* 846*  ALKPHOS 77 85  BILITOT 1.5* 3.7*  PROT 6.1 5.4*  ALBUMIN 3.3*  2.8*   CBC:  Recent Labs Lab 11/29/14 2243 11/30/14 1717 12/01/14 0820  WBC 34.5* 36.0* 29.1*  NEUTROABS 24.5*  --   --   HGB 4.2* 7.5* 9.0*  HCT 14.4* 21.6* 25.3*  MCV 88.3 84.0 82.1  PLT 191 118* 105*   Cardiac Enzymes:  Recent Labs Lab 11/30/14 0034  TROPONINI 0.13*   BNP (last 3 results)  Recent Labs  10/01/14 0414 10/04/14 0450 11/30/14 0034  BNP 888.4* 362.4* 3223.3*    ProBNP (last 3 results) No results for input(s): PROBNP in the last 8760 hours.  CBG:  Recent Labs Lab 11/30/14 1211 11/30/14 1621 11/30/14 1951 11/30/14 2222 12/01/14 0804  GLUCAP 149* 186* 191* 139* 111*    Recent Results (from the past 240 hour(s))  MRSA PCR Screening     Status: None   Collection Time: 11/30/14  4:00 AM  Result Value Ref Range Status   MRSA by PCR NEGATIVE NEGATIVE Final    Comment:        The GeneXpert MRSA Assay (FDA approved for NASAL specimens only), is one component of a comprehensive MRSA colonization surveillance program. It is not intended to diagnose MRSA infection nor to guide or monitor treatment for MRSA infections.   Clostridium Difficile by PCR     Status: None   Collection Time: 11/30/14  5:30 AM  Result Value Ref Range Status   C difficile by pcr NEGATIVE NEGATIVE Final    Comment: Performed at [facility]     Scheduled Meds: . sodium chloride   Intravenous Once  . antiseptic oral rinse  7 mL Mouth Rinse BID  . ciprofloxacin  400 mg Intravenous Q24H  . insulin aspart  0-15 Units Subcutaneous TID WC  . insulin aspart  0-5 Units Subcutaneous QHS  . metoprolol succinate  100 mg Oral Daily  . metronidazole  500 mg Intravenous Q8H  . predniSONE  12.5 mg Oral Q breakfast  . sodium chloride  3 mL Intravenous Q12H   Time spent: 50 minutes, more than 50% in family/patient counseling/discussions  Continuous Infusions:   Marzetta Board, MD [facility] Pager [phone]. If 7 PM - 7 AM, please contact night-coverage at  [email or web address], password [facility] 12/01/2014, 1:17 PM  LOS: 1 day

## 2014-12-02 LAB — GLUCOSE, CAPILLARY
GLUCOSE-CAPILLARY: 105 mg/dL — AB (ref 70–99)
GLUCOSE-CAPILLARY: 148 mg/dL — AB (ref 70–99)
Glucose-Capillary: 129 mg/dL — ABNORMAL HIGH (ref 70–99)
Glucose-Capillary: 184 mg/dL — ABNORMAL HIGH (ref 70–99)

## 2014-12-02 LAB — CBC
HCT: 24.2 % — ABNORMAL LOW (ref 36.0–46.0)
Hemoglobin: 8.5 g/dL — ABNORMAL LOW (ref 12.0–15.0)
MCH: 28.9 pg (ref 26.0–34.0)
MCHC: 35.1 g/dL (ref 30.0–36.0)
MCV: 82.3 fL (ref 78.0–100.0)
Platelets: 99 10*3/uL — ABNORMAL LOW (ref 150–400)
RBC: 2.94 MIL/uL — AB (ref 3.87–5.11)
RDW: 15.4 % (ref 11.5–15.5)
WBC: 20.4 10*3/uL — ABNORMAL HIGH (ref 4.0–10.5)

## 2014-12-02 LAB — COMPREHENSIVE METABOLIC PANEL
ALK PHOS: 78 U/L (ref 39–117)
ALT: 763 U/L — ABNORMAL HIGH (ref 0–35)
ANION GAP: 11 (ref 5–15)
AST: 310 U/L — ABNORMAL HIGH (ref 0–37)
Albumin: 2.7 g/dL — ABNORMAL LOW (ref 3.5–5.2)
BILIRUBIN TOTAL: 4.3 mg/dL — AB (ref 0.3–1.2)
BUN: 52 mg/dL — AB (ref 6–23)
CHLORIDE: 100 mmol/L (ref 96–112)
CO2: 27 mmol/L (ref 19–32)
CREATININE: 1.81 mg/dL — AB (ref 0.50–1.10)
Calcium: 8 mg/dL — ABNORMAL LOW (ref 8.4–10.5)
GFR calc non Af Amer: 24 mL/min — ABNORMAL LOW (ref >90)
GFR, EST AFRICAN AMERICAN: 28 mL/min — AB (ref >90)
Glucose, Bld: 128 mg/dL — ABNORMAL HIGH (ref 70–99)
POTASSIUM: 3 mmol/L — AB (ref 3.5–5.1)
Sodium: 138 mmol/L (ref 135–145)
Total Protein: 5.4 g/dL — ABNORMAL LOW (ref 6.0–8.3)

## 2014-12-02 MED ORDER — POTASSIUM CHLORIDE CRYS ER 20 MEQ PO TBCR
40.0000 meq | EXTENDED_RELEASE_TABLET | ORAL | Status: AC
  Administered 2014-12-02 (×2): 40 meq via ORAL
  Filled 2014-12-02 (×2): qty 2

## 2014-12-02 MED ORDER — LORAZEPAM 0.5 MG PO TABS
0.5000 mg | ORAL_TABLET | Freq: Once | ORAL | Status: AC
  Administered 2014-12-02: 0.5 mg via ORAL
  Filled 2014-12-02: qty 1

## 2014-12-02 MED ORDER — FUROSEMIDE 20 MG PO TABS
20.0000 mg | ORAL_TABLET | Freq: Every day | ORAL | Status: DC
  Administered 2014-12-02 – 2014-12-05 (×4): 20 mg via ORAL
  Filled 2014-12-02 (×4): qty 1

## 2014-12-02 MED ORDER — POTASSIUM CHLORIDE 20 MEQ PO PACK: 40.0000 meq | PACK | ORAL | Status: DC

## 2014-12-02 NOTE — Evaluation (Signed)
Physical Therapy Evaluation Patient Details Name: Rebekah Cross MRN: [id number] DOB: [birth year] Today's Date: 12/02/2014   History of Present Illness    Very pleasant and relatively functional 81 year old lady diagnosed with MDS, currently on hospice. She came in with profound dyspnea on exertion, diarrhea and poor by mouth intake, was found to be severely anemic with a hemoglobin around 4.   Clinical Impression  Pt admitted with dx of SIRS and presenting with functional mobility limitations 2* generalized weakness and ambulatory instability.  Pt is on hospice care and plans d.c home with 24/7 assist of very supportive family.  Pt would benefit from use of 3in1 and RW (youth level - pt is 5' tall) at home.    Follow Up Recommendations No PT follow up    Equipment Recommendations  Rolling walker with 5" wheels;3in1 (PT) (Pt will need youth level RW- pt is 5'0)    Recommendations for Other Services       Precautions / Restrictions Precautions Precautions: Fall Restrictions Weight Bearing Restrictions: No      Mobility  Bed Mobility Overal bed mobility: Needs Assistance Bed Mobility: Supine to Sit;Sit to Supine     Supine to sit: Min assist Sit to supine: Min guard   General bed mobility comments: Pt attempted sit up but unable, cued to roll to side and tranfer to sitting and accomplished with min difficulty  Transfers Overall transfer level: Needs assistance Equipment used: Rolling walker (2 wheeled) Transfers: Sit to/from Stand Sit to Stand: Min assist         General transfer comment: cues for transition position and use of UEs to self assist  Ambulation/Gait Ambulation/Gait assistance: Min assist Ambulation Distance (Feet): 62 Feet Assistive device: Rolling walker (2 wheeled) Gait Pattern/deviations: Step-through pattern;Decreased step length - right;Decreased step length - left;Shuffle;Trunk flexed;Narrow base of support Gait velocity: decr   General Gait  Details: cues for position from RW, posture and pacing.  Several short standing rests to complete task  Stairs            Wheelchair Mobility    Modified Rankin (Stroke Patients Only)       Balance Overall balance assessment: Needs assistance Sitting-balance support: Single extremity supported;Feet supported Sitting balance-Leahy Scale: Fair     Standing balance support: Bilateral upper extremity supported Standing balance-Leahy Scale: Poor                               Pertinent Vitals/Pain Pain Assessment: 0-10 Pain Score: 1  Pain Location: Abdominal  Pain Intervention(s): Limited activity within patient's tolerance;Monitored during session    [place] expects to be discharged to:: Private residence Living Arrangements: Children Available Help at Discharge: Family;Available 24 hours/day Type of Home: Mobile home Home Access: Stairs to enter Entrance Stairs-Rails: Right Entrance Stairs-Number of Steps: 4 Home Layout: One level Home Equipment: Tub bench      Prior Function Level of Independence: Needs assistance   Gait / Transfers Assistance Needed: IND     Comments: Family assisting as needed with ADL and homemaking     Hand Dominance   Dominant Hand: Right    Extremity/Trunk Assessment   Upper Extremity Assessment: Generalized weakness           Lower Extremity Assessment: Generalized weakness      Cervical / Trunk Assessment: Kyphotic  Communication   Communication: No difficulties  Cognition Arousal/Alertness: Awake/alert Behavior During Therapy: WFL for tasks assessed/performed  Overall Cognitive Status: Within Functional Limits for tasks assessed                      General Comments      Exercises        Assessment/Plan    PT Assessment Patient needs continued PT services  PT Diagnosis Difficulty walking   PT Problem List Decreased strength;Decreased activity tolerance;Decreased  balance;Decreased mobility;Decreased knowledge of use of DME;Pain  PT Treatment Interventions DME instruction;Gait training;Functional mobility training;Stair training;Therapeutic activities;Therapeutic exercise;Patient/family education   PT Goals (Current goals can be found in the Care Plan section) Acute Rehab PT Goals Patient Stated Goal: Stronger and return home PT Goal Formulation: With patient Time For Goal Achievement: 12/15/14 Potential to Achieve Goals: Good    Frequency Min 3X/week   Barriers to discharge        Co-evaluation               End of Session Equipment Utilized During Treatment: Gait belt Activity Tolerance: Patient limited by fatigue Patient left: in bed;with call bell/phone within reach;with family/visitor present Nurse Communication: Mobility status;Other (comment) (O2 sats on RA)         Time: 8270-7867 PT Time Calculation (min) (ACUTE ONLY): 29 min   Charges:   PT Evaluation $Initial PT Evaluation Tier I: 1 Procedure PT Treatments $Gait Training: 8-22 mins   PT G Codes:        Aryanne Gilleland 2014/12/16, 4:52 PM

## 2014-12-02 NOTE — Progress Notes (Signed)
PROGRESS NOTE   Rebekah Cross YTK:[id number] DOB: [birth year] DOA: 11/29/2014 PCP: Mayra Neer, MD  HPI: She is a very pleasant and relatively functional 81 year old lady diagnosed with MDS, currently on hospice. She came in with profound dyspnea on exertion, diarrhea and poor by mouth intake, was found to be severely anemic with a hemoglobin around 4.  Subjective / 24 H Interval events - feeling better this morning, sitting in chair  Assessment/Plan: Principal Problem:   SIRS (systemic inflammatory response syndrome) Active Problems:   MDS (myelodysplastic syndrome) with 5q deletion   CKD (chronic kidney disease) stage 3, GFR 30-59 ml/min   Diabetes mellitus with renal complications   Acute on chronic renal failure   Intractable vomiting with nausea   Symptomatic anemia   Acute on chronic combined systolic and diastolic heart failure   Lactic acidosis   Elevated LFTs   Hospice care patient   DNR (do not resuscitate)  Anemia - due to MDS - s/p 3U of pRBC, improved - Hemoglobin 8.5 this morning  Enteritis with elevated WBC - C diff negative - empiric Ciprofloxacin and Metronidazole, improving, no loose stools overnight, leukocytosis improving.  - Switched to Zosyn on 2/27 given significant elevation in LFTs and bilirubin  Elevated liver function tests - Right upper quadrant ultrasound without particular findings as to explain her LFTs as well as bilirubin elevation - I suspect this is related to profound anemia - Bilirubin is increasing today, however these may lag behind  MDS - on hospice  Acute on chronic combined systolic and diastolic heart failure  - with evidence of overload and weight gain at home + 3U blood - Her lower extremity edema has improved today, hold IV lasix to avoid overdiuresis, add low dose po Lasix. Replete potassium - Still with bibasilar crackles - Mild troponin leak likely due to demand  Renal failure - likely due to profound anemia -  Improving with diuresis  Nausea and vomiting  - improving, advance diet to soft diet today  HTN - restarted her home medications  Leukocytosis - improving with antibiotics   Thrombocytopenia - chronic  Goals of care - I have had extensive discussions 2/27 with multiple family members regarding her current condition and prognosis. I discussed with the patient that she has heart failure, renal failure, elevated liver enzymes and bone marrow failure. Her short term prognosis is quite poor; patient and her family have full understanding. She agrees with antibiotics and medical management. Per patient and family wishes, if her condition gets worse, will switch focus to full comfort. She does not wish to suffer under any circumstances.  Diet: DIET SOFT Fluids: none DVT Prophylaxis: SCD  Code Status: DNR Family Communication: d/w multiple family members Disposition Plan: home when ready   Consultants:  None   Procedures:  None    Antibiotics Ciprofloxacin and metronidazole 2/25 - 2/27 Zosyn 2/27 >>   Studies  US Abdomen Limited Ruq  12-14-14   CLINICAL DATA:  Abdominal pain, nausea vomiting diarrhea.  EXAM: US ABDOMEN LIMITED - RIGHT UPPER QUADRANT  COMPARISON:  None.  FINDINGS: Gallbladder:  No gallstones or wall thickening visualized. No sonographic Murphy sign noted.  Common bile duct:  Diameter: Mildly dilated at 7 mm  Liver:  No focal lesion identified. Within normal limits in parenchymal echogenicity.  IMPRESSION: 1. Mildly dilated common bile duct may relate to patient's age. 2. Normal gallbladder. 3. Normal liver.   Electronically Signed   By: Suzy Bouchard M.D.   On:  12/02/2014 07:37    Objective  Filed Vitals:   12/01/14 1146 12/01/14 1211 12/01/14 2137 12/02/14 0611  BP: 164/44 179/58 173/67 164/59  Pulse: 78 78 80 82  Temp:  98.1 F (36.7 C) 98.7 F (37.1 C) 99.2 F (37.3 C)  TempSrc:  Oral Oral Oral  Resp:  24 24 22   Height:  5\' 3"  (1.6 m)    Weight:   60.238 kg (132 lb 12.8 oz)  54.613 kg (120 lb 6.4 oz)  SpO2:  99% 99% 100%    Intake/Output Summary (Last 24 hours) at 12/02/14 1052 Last data filed at 12/02/14 0841  Gross per 24 hour  Intake    640 ml  Output   3450 ml  Net  -2810 ml   Filed Weights   12/01/14 0400 12/01/14 1211 12/02/14 0611  Weight: 57.7 kg (127 lb 3.3 oz) 60.238 kg (132 lb 12.8 oz) 54.613 kg (120 lb 6.4 oz)    Exam:  General:  NAD, sitting in chair  HEENT: Mild scleral icterus  Cardiovascular: RRR, no lower extremity edema  Respiratory: bibasilar crackles  Abdomen: mild tenderness  MSK/Extremities: no cyanosis  Skin: no rashes  Neuro: non focal   Data Reviewed: Basic Metabolic Panel:  Recent Labs Lab 11/29/14 2243 12/01/14 0820 12/02/14 0430  NA 135 136 138  K 4.1 3.3* 3.0*  CL 99 104 100  CO2 7* 22 27  GLUCOSE 332* 111* 128*  BUN 45* 57* 52*  CREATININE 1.69* 2.04* 1.81*  CALCIUM 8.8 7.8* 8.0*   Liver Function Tests:  Recent Labs Lab 11/29/14 2243 12/01/14 0820 12/02/14 0430  AST 184* 441* 310*  ALT 282* 846* 763*  ALKPHOS 77 85 78  BILITOT 1.5* 3.7* 4.3*  PROT 6.1 5.4* 5.4*  ALBUMIN 3.3* 2.8* 2.7*   CBC:  Recent Labs Lab 11/29/14 2243 11/30/14 1717 12/01/14 0820 12/02/14 0430  WBC 34.5* 36.0* 29.1* 20.4*  NEUTROABS 24.5*  --   --   --   HGB 4.2* 7.5* 9.0* 8.5*  HCT 14.4* 21.6* 25.3* 24.2*  MCV 88.3 84.0 82.1 82.3  PLT 191 118* 105* 99*   Cardiac Enzymes:  Recent Labs Lab 11/30/14 0034  TROPONINI 0.13*   BNP (last 3 results)  Recent Labs  10/01/14 0414 10/04/14 0450 11/30/14 0034  BNP 888.4* 362.4* 3223.3*    CBG:  Recent Labs Lab 12/01/14 0804 12/01/14 1134 12/01/14 1644 12/01/14 2143 12/02/14 0737  GLUCAP 111* 147* 152* 135* 105*    Recent Results (from the past 240 hour(s))  MRSA PCR Screening     Status: None   Collection Time: 11/30/14  4:00 AM  Result Value Ref Range Status   MRSA by PCR NEGATIVE NEGATIVE Final    Comment:         The GeneXpert MRSA Assay (FDA approved for NASAL specimens only), is one component of a comprehensive MRSA colonization surveillance program. It is not intended to diagnose MRSA infection nor to guide or monitor treatment for MRSA infections.   Clostridium Difficile by PCR     Status: None   Collection Time: 11/30/14  5:30 AM  Result Value Ref Range Status   C difficile by pcr NEGATIVE NEGATIVE Final    Comment: Performed at [facility]     Scheduled Meds: . sodium chloride   Intravenous Once  . antiseptic oral rinse  7 mL Mouth Rinse BID  . insulin aspart  0-15 Units Subcutaneous TID WC  . insulin aspart  0-5 Units Subcutaneous  QHS  . metoprolol succinate  100 mg Oral Daily  . piperacillin-tazobactam (ZOSYN)  IV  2.25 g Intravenous Q6H  . potassium chloride  40 mEq Oral Q3H  . predniSONE  12.5 mg Oral Q breakfast  . sodium chloride  3 mL Intravenous Q12H   Time spent: 35 minutes, more than 50% in family/patient counseling/discussions  Continuous Infusions:   Marzetta Board, MD [facility] Pager [phone]. If 7 PM - 7 AM, please contact night-coverage at [email or web address], password [facility] 12/02/2014, 10:52 AM  LOS: 2 days

## 2014-12-02 NOTE — Progress Notes (Signed)
GIP visit- Daleysa Kristiansen [facility] 0045- [facility] RN  Related admission to [facility] diagnosis of Myelodysplastic syndrome. Pt is a DNR code. Arrived in room patient was sitting up in the recliner with two sons at bedside. Patient alert and oriented x3 and states that she is feeling better today. Patient had a bowel movement today. Patient has been switched to liquid morphine which she had two doses yesterday and one dose this morning. Patient also receiving IV Zofran for nausea one dose this morning. In speaking with family they stated that her liver enzymes have come down slightly and PT was not started yesterday but hopefully today. Patient in good spirits and plan is for her to go home when ready. Spoke with nurse Joellen Jersey and she states she will call PT and see if she is on the list for PT today.  Please call [facility] @ [phone] with any hospice needs. Thank you Ardath Sax RN BSN [facility]

## 2014-12-03 LAB — GLUCOSE, CAPILLARY
GLUCOSE-CAPILLARY: 239 mg/dL — AB (ref 70–99)
GLUCOSE-CAPILLARY: 178 mg/dL — AB (ref 70–99)
Glucose-Capillary: 134 mg/dL — ABNORMAL HIGH (ref 70–99)
Glucose-Capillary: 191 mg/dL — ABNORMAL HIGH (ref 70–99)

## 2014-12-03 LAB — COMPREHENSIVE METABOLIC PANEL
ALBUMIN: 2.7 g/dL — AB (ref 3.5–5.2)
ALK PHOS: 78 U/L (ref 39–117)
ALT: 644 U/L — AB (ref 0–35)
AST: 159 U/L — AB (ref 0–37)
Anion gap: 9 (ref 5–15)
BILIRUBIN TOTAL: 3.6 mg/dL — AB (ref 0.3–1.2)
BUN: 47 mg/dL — AB (ref 6–23)
CHLORIDE: 104 mmol/L (ref 96–112)
CO2: 28 mmol/L (ref 19–32)
Calcium: 8.4 mg/dL (ref 8.4–10.5)
Creatinine, Ser: 1.38 mg/dL — ABNORMAL HIGH (ref 0.50–1.10)
GFR calc Af Amer: 39 mL/min — ABNORMAL LOW (ref >90)
GFR calc non Af Amer: 33 mL/min — ABNORMAL LOW (ref >90)
Glucose, Bld: 144 mg/dL — ABNORMAL HIGH (ref 70–99)
POTASSIUM: 3.8 mmol/L (ref 3.5–5.1)
SODIUM: 141 mmol/L (ref 135–145)
Total Protein: 5.7 g/dL — ABNORMAL LOW (ref 6.0–8.3)

## 2014-12-03 LAB — CBC
HEMATOCRIT: 25.3 % — AB (ref 36.0–46.0)
HEMOGLOBIN: 8.8 g/dL — AB (ref 12.0–15.0)
MCH: 29.5 pg (ref 26.0–34.0)
MCHC: 34.8 g/dL (ref 30.0–36.0)
MCV: 84.9 fL (ref 78.0–100.0)
Platelets: 103 10*3/uL — ABNORMAL LOW (ref 150–400)
RBC: 2.98 MIL/uL — AB (ref 3.87–5.11)
RDW: 15.9 % — ABNORMAL HIGH (ref 11.5–15.5)
WBC: 17.8 10*3/uL — ABNORMAL HIGH (ref 4.0–10.5)

## 2014-12-03 MED ORDER — PIPERACILLIN-TAZOBACTAM 3.375 G IVPB
3.3750 g | Freq: Three times a day (TID) | INTRAVENOUS | Status: DC
  Administered 2014-12-03 – 2014-12-05 (×6): 3.375 g via INTRAVENOUS
  Filled 2014-12-03 (×8): qty 50

## 2014-12-03 NOTE — Social Work (Signed)
[facility] social worker, Mikle Bosworth visited w/ pt's son Audry Pili and daughter Terrence Dupont in pt's room. Pt roused briefly but slept most of the visit. Family reported that while they had briefly talked about switching to another hospice, pt stated that she wants to stay w/ [facility]. SW provided emotional support related to the recent stressful experience and apologized again to family for the delayed response from [place]. SW also provided emotional support regarding pt's decline and family's anticipatory grief. SW reinforced Emma's idea to read Gone From My Sight book to prepare herself with what to expect as pt continues to decline and near EOL. SW will visit pt/family when pt is d/c from hospital. Please call [phone] with any questions.   Mikle Bosworth, LCSW Hospice Social Worker [phone], ext. [phone]

## 2014-12-03 NOTE — Progress Notes (Signed)
Saw patient. She was asleep. Her daughter Terrence Dupont said she is better. Still have pain in the abdomen. Mother is at peace with hospice and would like to go home eventually rather than in patient hospice facility.

## 2014-12-03 NOTE — Progress Notes (Signed)
ANTIBIOTIC CONSULT NOTE   Pharmacy Consult for Zosyn Indication: intra-abdominal infection  Allergies  Allergen Reactions  . Novocain [Procaine] Swelling and Rash  . Oxycodone Nausea And Vomiting and Other (See Comments)    "jerking"  . Codeine Nausea And Vomiting and Rash    unknown  . Methimazole [Thiamazole] Rash  . Revlimid [Lenalidomide] Rash    Patient Measurements: Height: 5\' 3"  (160 cm) Weight: 120 lb 6.4 oz (54.613 kg) IBW/kg (Calculated) : 52.4  Vital Signs: Temp: 97.6 F (36.4 C) (02/28 2051) Temp Source: Oral (02/28 2051) BP: 166/57 mmHg (02/28 2051) Pulse Rate: 82 (02/28 2051) Intake/Output from previous day: 02/28 0701 - 02/29 0700 In: 440 [P.O.:240; IV Piggyback:200] Out: 500 [Urine:500] Intake/Output from this shift:    Labs:  Recent Labs  12/01/14 0820 12/02/14 0430 12/03/14 0450  WBC 29.1* 20.4* 17.8*  HGB 9.0* 8.5* 8.8*  PLT 105* 99* 103*  CREATININE 2.04* 1.81* 1.38*   Estimated Creatinine Clearance: 23.8 mL/min (by C-G formula based on Cr of 1.38).  Medical History: Past Medical History  Diagnosis Date  . Anemia, unspecified   . Hypertension   . Thyroid disease   . Hypercholesteremia   . MDS (myelodysplastic syndrome) with 5q deletion 07/11/2013  . Psoriasis 03/19/2014  . Type II or unspecified type diabetes mellitus without mention of complication, not stated as uncontrolled 04/16/2014  . Itching 05/21/2014  . Abdominal pain, acute, left upper quadrant 07/16/2014  . Bilateral leg pain 11/05/2014    Medications:  Scheduled:  . sodium chloride   Intravenous Once  . antiseptic oral rinse  7 mL Mouth Rinse BID  . furosemide  20 mg Oral Daily  . insulin aspart  0-15 Units Subcutaneous TID WC  . insulin aspart  0-5 Units Subcutaneous QHS  . metoprolol succinate  100 mg Oral Daily  . piperacillin-tazobactam (ZOSYN)  IV  3.375 g Intravenous Q8H  . predniSONE  12.5 mg Oral Q breakfast  . sodium chloride  3 mL Intravenous Q12H    Assessment: 89 yoF hospice patient admitted 2/26 from home with ShOB, jaw pain, and back pain. Also with dehydration, loose stools. Originally started on ciprofloxacin and metronidazole for intra-abdominal infection, changing to Zosyn per Pharmacy on 2/27 given elevation in LFTs and bilirubin, ?cholangitis.  Antiinfectives  2/26 >> ciprofloxacin x1 2/26 >> metronidazole >> 2/27 2/27 >> Zosyn >>  Microbiology 2/26 MRSA PCR: negative 2/26 C.diff PCR: negative  Goal of Therapy:  Zosyn per indication and renal function   Today, 12/03/2014: D#3 Zosyn 2.25 g IV q6h Afebrile this AM Leukocytosis slowly improving Azotemia improving; estimated CrCl now > 20 mL/min No new culture data Goals of care noted - continuing antibiotic therapy for now  Plan:  1. Given improvement in renal function, increase Zosyn to 3.375 grams IV q8h, each dose infused over 4 hours. 2. Follow renal function, clinical course. 3. Await further updates on planned duration of therapy and goals of therapy.  Clayburn Pert, PharmD, BCPS Pager: [phone] 12/03/2014  7:36 AM

## 2014-12-03 NOTE — Progress Notes (Signed)
CARE MANAGEMENT NOTE 12/03/2014  Patient:  Rebekah Cross, Rebekah Cross   Account Number:  [id number]  Date Initiated:  11/30/2014  Documentation initiated by:  DAVIS,RHONDA  Subjective/Objective Assessment:   pt with hgb 4.0, wbc over 35 admitted from home     Action/Plan:   currently with home hospice   Anticipated DC Date:  12/03/2014   Anticipated DC Plan:  [place]  In-house referral  NA      DC Planning Services  CM consult      PAC Choice  NA   Choice offered to / List presented to:  NA   DME arranged  NA      DME agency  NA     [place] arranged  NA      [place] agency  NA   Status of service:  In process, will continue to follow Medicare Important Message given?   (If response is "NO", the following Medicare IM given date fields will be blank) Date Medicare IM given:   Medicare IM given by:   Date Additional Medicare IM given:   Additional Medicare IM given by:    Discharge Disposition:    Per UR Regulation:  Reviewed for med. necessity/level of care/duration of stay  If discussed at [place] of Stay Meetings, dates discussed:   12/03/2014    Comments:  12/03/14 MMcGibboney, RN, BSN Pt active with [facility].  Feb. 26 2016/Rhonda L. Rosana Hoes, RN, BSN, CCM. Case Management [place] [phone] No discharge needs present of time of review.

## 2014-12-03 NOTE — Progress Notes (Signed)
Inpatient RN visit- [place] [facility] RN Visit-Rebekah Alford Highland RN  Related admission to [facility] diagnosis of Myelodysplastic syndrome. Pt is a DNR code.    Pt alert & oriented, lying in bed w/eyes closed, easily awakened by voice. Patient reports feeling "better today", she did eat some breakfast and denied nausea. She reported being "sore" in her back and abdomen. Per chart review she has recvd one dose of PRN liquid morphine for pain earlier this morning. She is now off contact precautions, C diff negative, she does remain on IV abt which has been changed to Zosyn due elevated LFT's. She is no longer requiring O2. Writer did discuss getting O2 in place in her home prior to discharge, she voiced understanding and agreement. Patient's son Rebekah Cross and daughter Rebekah Cross came in during visit. Discussed need for oxygen to be in the home, patient's home care RN had previously discussed this and they were in agreement. Oxygen ordered with [facility] DME rep Rebekah Cross to be delivered today. Rebekah Cross to be the contact for delivery at [phone]). Other equipment requested includes a youth rolling walker and 3 in 1. Writer contacted [facility] in house rep Rebekah Cross for these to be delivered to the patient's room, for Rebekah Cross to take to the home today.  Writer also discussed mode of transport needed at discharge, Rebekah Cross relayed that there are several steps for the patient to navigate to get into her home which she had been having more difficulty with last week.  Patient will need non emergent transport at discharge. CSW Rebekah Cross advised.  Patient working with physical therapy at end of visit. [facility] will continue to follow. Patient will need prescription for liquid morphine concentrate 10 mg/0.90ml, dispense 30 mls. Patient's home medication list, transfer summary and OOF DNR in place on shadow chart.  Please call [facility] @ [phone]-with any hospice needs.   Thank you. Rebekah Shanks, RN, BSN,  [place] Liaison  [phone])

## 2014-12-03 NOTE — Progress Notes (Signed)
PROGRESS NOTE   AMMI HUTT WPY:[id number] DOB: 12/17/1924 DOA: 11/29/2014 PCP: Mayra Neer, MD  HPI: She is a very pleasant and relatively functional 81 year old lady diagnosed with MDS, currently on hospice. She came in with profound dyspnea on exertion, diarrhea and poor by mouth intake, was found to be severely anemic with a hemoglobin around 4.  Subjective / 24 H Interval events - abdominal "soreness" this morning  Assessment/Plan: Principal Problem:   SIRS (systemic inflammatory response syndrome) Active Problems:   MDS (myelodysplastic syndrome) with 5q deletion   CKD (chronic kidney disease) stage 3, GFR 30-59 ml/min   Diabetes mellitus with renal complications   Acute on chronic renal failure   Intractable vomiting with nausea   Symptomatic anemia   Acute on chronic combined systolic and diastolic heart failure   Lactic acidosis   Elevated LFTs   Hospice care patient   DNR (do not resuscitate)  Anemia - due to MDS - s/p 3U of pRBC, improved - Hb stable  Enteritis with elevated WBC - C diff negative - initially on empiric Ciprofloxacin and Metronidazole, improving, no loose stools overnight, leukocytosis improving.  - Switched to Zosyn on 2/27 given significant elevation in LFTs and bilirubin  Elevated liver function tests - Right upper quadrant ultrasound without particular findings as to explain her LFTs as well as bilirubin elevation - I suspect this is related to profound anemia - LFTs improved today   MDS - on hospice  Acute on chronic combined systolic and diastolic heart failure  - with evidence of overload and weight gain at home + 3U blood - Her lower extremity edema has improved today, continue po Lasix - bibasilar crackles improved - Mild troponin leak likely due to demand  Renal failure - likely due to profound anemia - Improving  Nausea and vomiting  - improving, advance diet to soft diet today  HTN - restarted her home  medications  Leukocytosis - improving with antibiotics   Thrombocytopenia - chronic  Goals of care - I have had extensive discussions 2/27 with multiple family members regarding her current condition and prognosis. I discussed with the patient that she has heart failure, renal failure, elevated liver enzymes and bone marrow failure. Her short term prognosis is quite poor; patient and her family have full understanding. She agrees with antibiotics and medical management. Per patient and family wishes, if her condition gets worse, will switch focus to full comfort. She does not wish to suffer under any circumstances.   Diet: DIET SOFT Fluids: none DVT Prophylaxis: SCD  Code Status: DNR Family Communication: d/w multiple family members Disposition Plan: home when ready   Consultants:  None   Procedures:  None    Antibiotics Ciprofloxacin and metronidazole 2/25 - 2/27 Zosyn 2/27 >>   Studies  US Abdomen Limited Ruq  12/22/14   CLINICAL DATA:  Abdominal pain, nausea vomiting diarrhea.  EXAM: US ABDOMEN LIMITED - RIGHT UPPER QUADRANT  COMPARISON:  None.  FINDINGS: Gallbladder:  No gallstones or wall thickening visualized. No sonographic Murphy sign noted.  Common bile duct:  Diameter: Mildly dilated at 7 mm  Liver:  No focal lesion identified. Within normal limits in parenchymal echogenicity.  IMPRESSION: 1. Mildly dilated common bile duct may relate to patient's age. 2. Normal gallbladder. 3. Normal liver.   Electronically Signed   By: Suzy Bouchard M.D.   On: 12/22/2014 07:37    Objective  Filed Vitals:   2014-12-22 1500 12/22/2014 2051 12/03/14 0945 12/03/14 1018  BP: 150/42 166/57 154/52 154/52  Pulse: 78 82 84 84  Temp:  97.6 F (36.4 C) 97.6 F (36.4 C)   TempSrc:  Oral Oral   Resp: 20 20 18    Height:      Weight:      SpO2: 93% 98% 98%     Intake/Output Summary (Last 24 hours) at 12/03/14 1316 Last data filed at 12/03/14 1203  Gross per 24 hour  Intake    810  ml  Output    300 ml  Net    510 ml   Filed Weights   12/01/14 0400 12/01/14 1211 12/02/14 0611  Weight: 57.7 kg (127 lb 3.3 oz) 60.238 kg (132 lb 12.8 oz) 54.613 kg (120 lb 6.4 oz)    Exam:  General:  NAD, sitting in chair  HEENT: mild scleral icterus  Cardiovascular: RRR, no lower extremity edema  Respiratory: bibasilar crackles  Abdomen: mild tenderness  MSK/Extremities: no cyanosis  Skin: no rashes  Neuro: non focal   Data Reviewed: Basic Metabolic Panel:  Recent Labs Lab 11/29/14 2243 12/01/14 0820 12/02/14 0430 12/03/14 0450  NA 135 136 138 141  K 4.1 3.3* 3.0* 3.8  CL 99 104 100 104  CO2 7* 22 27 28   GLUCOSE 332* 111* 128* 144*  BUN 45* 57* 52* 47*  CREATININE 1.69* 2.04* 1.81* 1.38*  CALCIUM 8.8 7.8* 8.0* 8.4   Liver Function Tests:  Recent Labs Lab 11/29/14 2243 12/01/14 0820 12/02/14 0430 12/03/14 0450  AST 184* 441* 310* 159*  ALT 282* 846* 763* 644*  ALKPHOS 77 85 78 78  BILITOT 1.5* 3.7* 4.3* 3.6*  PROT 6.1 5.4* 5.4* 5.7*  ALBUMIN 3.3* 2.8* 2.7* 2.7*   CBC:  Recent Labs Lab 11/29/14 2243 11/30/14 1717 12/01/14 0820 12/02/14 0430 12/03/14 0450  WBC 34.5* 36.0* 29.1* 20.4* 17.8*  NEUTROABS 24.5*  --   --   --   --   HGB 4.2* 7.5* 9.0* 8.5* 8.8*  HCT 14.4* 21.6* 25.3* 24.2* 25.3*  MCV 88.3 84.0 82.1 82.3 84.9  PLT 191 118* 105* 99* 103*   Cardiac Enzymes:  Recent Labs Lab 11/30/14 0034  TROPONINI 0.13*   BNP (last 3 results)  Recent Labs  10/01/14 0414 10/04/14 0450 11/30/14 0034  BNP 888.4* 362.4* 3223.3*    CBG:  Recent Labs Lab 12/02/14 1139 12/02/14 1631 12/02/14 2036 12/03/14 0729 12/03/14 1155  GLUCAP 129* 184* 148* 134* 239*    Recent Results (from the past 240 hour(s))  MRSA PCR Screening     Status: None   Collection Time: 11/30/14  4:00 AM  Result Value Ref Range Status   MRSA by PCR NEGATIVE NEGATIVE Final    Comment:        The GeneXpert MRSA Assay (FDA approved for NASAL  specimens only), is one component of a comprehensive MRSA colonization surveillance program. It is not intended to diagnose MRSA infection nor to guide or monitor treatment for MRSA infections.   Clostridium Difficile by PCR     Status: None   Collection Time: 11/30/14  5:30 AM  Result Value Ref Range Status   C difficile by pcr NEGATIVE NEGATIVE Final    Comment: Performed at [facility]     Scheduled Meds: . sodium chloride   Intravenous Once  . antiseptic oral rinse  7 mL Mouth Rinse BID  . furosemide  20 mg Oral Daily  . insulin aspart  0-15 Units Subcutaneous TID WC  . insulin aspart  0-5 Units Subcutaneous QHS  . metoprolol succinate  100 mg Oral Daily  . piperacillin-tazobactam (ZOSYN)  IV  3.375 g Intravenous Q8H  . predniSONE  12.5 mg Oral Q breakfast  . sodium chloride  3 mL Intravenous Q12H   Time spent: 25 minutes, more than 50% in family/patient counseling/discussions  Continuous Infusions:   Marzetta Board, MD [facility] Pager [phone]. If 7 PM - 7 AM, please contact night-coverage at [email or web address], password [facility] 12/03/2014, 1:16 PM  LOS: 3 days

## 2014-12-03 NOTE — Progress Notes (Signed)
Physical Therapy Treatment Patient Details Name: Rebekah Cross MRN: [id number] DOB: 28-Jun-1925 Today's Date: 12/03/2014    History of Present Illness Rebekah Cross is an 81 year old woman with a history of low-grade myelodysplastic syndrome, transfusion dependent since 2014, admitted to the [facility] emergency department with a one-week history of progressive exertional dyspnea, yellow productive cough, generalized weakness with gait instability, subjective fever and chills, decreased appetite, intermittent nausea with one episode of vomiting, and complaining of urinary urgency and frequency as well    PT Comments    Pt feeling better and eager to D/C to home with [place] care.  Assisted OOB to amb a limited distance.  Pt wants to do as much as she can and looks forward to getting home.  Follow Up Recommendations  No PT follow up (HOSPICE)     Equipment Recommendations  Rolling walker with 5" wheels;3in1 (PT) (youth RW)    Recommendations for Other Services       Precautions / Restrictions Precautions Precautions: Fall    Mobility  Bed Mobility Overal bed mobility: Needs Assistance Bed Mobility: Supine to Sit     Supine to sit: Min assist     General bed mobility comments: increased time  Transfers Overall transfer level: Needs assistance Equipment used: Rolling walker (2 wheeled) Transfers: Sit to/from Stand           General transfer comment: cues for transition position and use of UEs to self assist  Ambulation/Gait Ambulation/Gait assistance: Min assist Ambulation Distance (Feet): 38 Feet Assistive device: Rolling walker (2 wheeled) Gait Pattern/deviations: Step-through pattern;Trunk flexed Gait velocity: decreased   General Gait Details: cues for position from RW, posture and pacing. family member followed with recliner.   Stairs            Wheelchair Mobility    Modified Rankin (Stroke Patients Only)       Balance                                     Cognition Arousal/Alertness: Awake/alert Behavior During Therapy: WFL for tasks assessed/performed Overall Cognitive Status: Within Functional Limits for tasks assessed                      Exercises      General Comments        Pertinent Vitals/Pain Pain Assessment: No/denies pain    Home Living                      Prior Function            PT Goals (current goals can now be found in the care plan section) Progress towards PT goals: Progressing toward goals    Frequency  Min 3X/week    PT Plan      Co-evaluation             End of Session Equipment Utilized During Treatment: Gait belt Activity Tolerance: Patient limited by fatigue Patient left: in chair;with call bell/phone within reach;with family/visitor present     Time: 1020-1045 PT Time Calculation (min) (ACUTE ONLY): 25 min  Charges:  $Gait Training: 8-22 mins $Therapeutic Activity: 8-22 mins                    G Codes:      Rica Koyanagi  PTA [facility]  Acute  Rehab Pager  [phone]   

## 2014-12-04 LAB — COMPREHENSIVE METABOLIC PANEL
ALT: 437 U/L — ABNORMAL HIGH (ref 0–35)
ANION GAP: 9 (ref 5–15)
AST: 62 U/L — ABNORMAL HIGH (ref 0–37)
Albumin: 2.7 g/dL — ABNORMAL LOW (ref 3.5–5.2)
Alkaline Phosphatase: 75 U/L (ref 39–117)
BILIRUBIN TOTAL: 2.2 mg/dL — AB (ref 0.3–1.2)
BUN: 42 mg/dL — AB (ref 6–23)
CO2: 28 mmol/L (ref 19–32)
Calcium: 8.5 mg/dL (ref 8.4–10.5)
Chloride: 105 mmol/L (ref 96–112)
Creatinine, Ser: 1.28 mg/dL — ABNORMAL HIGH (ref 0.50–1.10)
GFR calc non Af Amer: 36 mL/min — ABNORMAL LOW (ref >90)
GFR, EST AFRICAN AMERICAN: 42 mL/min — AB (ref >90)
Glucose, Bld: 125 mg/dL — ABNORMAL HIGH (ref 70–99)
POTASSIUM: 3.1 mmol/L — AB (ref 3.5–5.1)
SODIUM: 142 mmol/L (ref 135–145)
Total Protein: 5.5 g/dL — ABNORMAL LOW (ref 6.0–8.3)

## 2014-12-04 LAB — CBC
HCT: 24.7 % — ABNORMAL LOW (ref 36.0–46.0)
HEMOGLOBIN: 8.7 g/dL — AB (ref 12.0–15.0)
MCH: 30.1 pg (ref 26.0–34.0)
MCHC: 35.2 g/dL (ref 30.0–36.0)
MCV: 85.5 fL (ref 78.0–100.0)
Platelets: 104 10*3/uL — ABNORMAL LOW (ref 150–400)
RBC: 2.89 MIL/uL — ABNORMAL LOW (ref 3.87–5.11)
RDW: 16.1 % — ABNORMAL HIGH (ref 11.5–15.5)
WBC: 19 10*3/uL — ABNORMAL HIGH (ref 4.0–10.5)

## 2014-12-04 LAB — GLUCOSE, CAPILLARY
Glucose-Capillary: 117 mg/dL — ABNORMAL HIGH (ref 70–99)
Glucose-Capillary: 172 mg/dL — ABNORMAL HIGH (ref 70–99)
Glucose-Capillary: 191 mg/dL — ABNORMAL HIGH (ref 70–99)
Glucose-Capillary: 213 mg/dL — ABNORMAL HIGH (ref 70–99)

## 2014-12-04 MED ORDER — POTASSIUM CHLORIDE CRYS ER 20 MEQ PO TBCR
40.0000 meq | EXTENDED_RELEASE_TABLET | Freq: Once | ORAL | Status: AC
  Administered 2014-12-04: 40 meq via ORAL
  Filled 2014-12-04: qty 2

## 2014-12-04 NOTE — Progress Notes (Signed)
PROGRESS NOTE   Rebekah Cross [id number]:[id number] DOB: 10-14-1924 DOA: 11/29/2014 PCP: Mayra Neer, MD  HPI: She is a very pleasant and relatively functional 81 year old lady diagnosed with MDS, currently on hospice. She came in with profound dyspnea on exertion, diarrhea and poor by mouth intake, was found to be severely anemic with a hemoglobin around 4.  Subjective / 24 H Interval events - abdominal "soreness" this morning  Assessment/Plan: Principal Problem:   SIRS (systemic inflammatory response syndrome) Active Problems:   MDS (myelodysplastic syndrome) with 5q deletion   CKD (chronic kidney disease) stage 3, GFR 30-59 ml/min   Diabetes mellitus with renal complications   Acute on chronic renal failure   Intractable vomiting with nausea   Symptomatic anemia   Acute on chronic combined systolic and diastolic heart failure   Lactic acidosis   Elevated LFTs   Hospice care patient   DNR (do not resuscitate)  Anemia - due to MDS - s/p 3U of pRBC, improved - Hb stable  Enteritis with elevated WBC - C diff negative - initially on empiric Ciprofloxacin and Metronidazole, improving, no loose stools overnight, leukocytosis improving.  - Switched to Zosyn on 2/27 given significant elevation in LFTs and bilirubin, continue IV given again a rise in WBC today   Elevated liver function tests - Right upper quadrant ultrasound without particular findings as to explain her LFTs as well as bilirubin elevation - I suspect this is related to profound anemia and poor relative liver perfusion  - LFTs improved today   MDS - on hospice  Acute on chronic combined systolic and diastolic heart failure  - with evidence of overload and weight gain at home + 3U blood - Her lower extremity edema has improved today, continue po Lasix - bibasilar crackles improved - Mild troponin leak likely due to demand  Renal failure - likely due to profound anemia - Improving  Nausea and vomiting  -  improving, tolerating diet - worsening of her abdominal "soreness" 3/1  HTN - restarted her home medications  Leukocytosis - improving with antibiotics   Thrombocytopenia - chronic  Goals of care - I have had extensive discussions 2/27 with multiple family members regarding her current condition and prognosis. I discussed with the patient that she has heart failure, renal failure, elevated liver enzymes and bone marrow failure. Her short term prognosis is quite poor; patient and her family have full understanding. She agrees with antibiotics and medical management. Per patient and family wishes, if her condition gets worse, will switch focus to full comfort. She does not wish to suffer under any circumstances.   Diet: DIET SOFT Fluids: none DVT Prophylaxis: SCD  Code Status: DNR Family Communication: d/w multiple family members Disposition Plan: home when ready   Consultants:  None   Procedures:  None    Antibiotics Ciprofloxacin and metronidazole 2/25 - 2/27 Zosyn 2/27 >>   Studies  No results found.  Objective  Filed Vitals:   12/03/14 1500 12/03/14 2108 12/04/14 0509 12/04/14 0926  BP: 151/55 169/53 179/66 168/58  Pulse: 75 75 83 79  Temp: 98.2 F (36.8 C) 98.9 F (37.2 C) 98.1 F (36.7 C)   TempSrc: Oral Oral Oral   Resp: 18 18 18 18   Height:      Weight:      SpO2: 98% 99% 97% 100%    Intake/Output Summary (Last 24 hours) at 12/04/14 1400 Last data filed at 12/04/14 1100  Gross per 24 hour  Intake   1040  ml  Output    300 ml  Net    740 ml   Filed Weights   12/01/14 0400 12/01/14 1211 12/02/14 0611  Weight: 57.7 kg (127 lb 3.3 oz) 60.238 kg (132 lb 12.8 oz) 54.613 kg (120 lb 6.4 oz)    Exam:  General:  NAD, sitting in chair  HEENT: no scleral icterus  Cardiovascular: RRR, no lower extremity edema  Respiratory: bibasilar crackles  Abdomen: mild tenderness  MSK/Extremities: no cyanosis  Skin: no rashes  Neuro: non focal   Data  Reviewed: Basic Metabolic Panel:  Recent Labs Lab 11/29/14 2243 12/01/14 0820 12/02/14 0430 12/03/14 0450 12/04/14 0520  NA 135 136 138 141 142  K 4.1 3.3* 3.0* 3.8 3.1*  CL 99 104 100 104 105  CO2 7* 22 27 28 28   GLUCOSE 332* 111* 128* 144* 125*  BUN 45* 57* 52* 47* 42*  CREATININE 1.69* 2.04* 1.81* 1.38* 1.28*  CALCIUM 8.8 7.8* 8.0* 8.4 8.5   Liver Function Tests:  Recent Labs Lab 11/29/14 2243 12/01/14 0820 12/02/14 0430 12/03/14 0450 12/04/14 0520  AST 184* 441* 310* 159* 62*  ALT 282* 846* 763* 644* 437*  ALKPHOS 77 85 78 78 75  BILITOT 1.5* 3.7* 4.3* 3.6* 2.2*  PROT 6.1 5.4* 5.4* 5.7* 5.5*  ALBUMIN 3.3* 2.8* 2.7* 2.7* 2.7*   CBC:  Recent Labs Lab 11/29/14 2243 11/30/14 1717 12/01/14 0820 12/02/14 0430 12/03/14 0450 12/04/14 0520  WBC 34.5* 36.0* 29.1* 20.4* 17.8* 19.0*  NEUTROABS 24.5*  --   --   --   --   --   HGB 4.2* 7.5* 9.0* 8.5* 8.8* 8.7*  HCT 14.4* 21.6* 25.3* 24.2* 25.3* 24.7*  MCV 88.3 84.0 82.1 82.3 84.9 85.5  PLT 191 118* 105* 99* 103* 104*   Cardiac Enzymes:  Recent Labs Lab 11/30/14 0034  TROPONINI 0.13*   BNP (last 3 results)  Recent Labs  10/01/14 0414 10/04/14 0450 11/30/14 0034  BNP 888.4* 362.4* 3223.3*    CBG:  Recent Labs Lab 12/03/14 1155 12/03/14 1654 12/03/14 2105 12/04/14 0743 12/04/14 1142  GLUCAP 239* 191* 178* 117* 172*    Recent Results (from the past 240 hour(s))  MRSA PCR Screening     Status: None   Collection Time: 11/30/14  4:00 AM  Result Value Ref Range Status   MRSA by PCR NEGATIVE NEGATIVE Final    Comment:        The GeneXpert MRSA Assay (FDA approved for NASAL specimens only), is one component of a comprehensive MRSA colonization surveillance program. It is not intended to diagnose MRSA infection nor to guide or monitor treatment for MRSA infections.   Clostridium Difficile by PCR     Status: None   Collection Time: 11/30/14  5:30 AM  Result Value Ref Range Status   C  difficile by pcr NEGATIVE NEGATIVE Final    Comment: Performed at [facility]     Scheduled Meds: . sodium chloride   Intravenous Once  . antiseptic oral rinse  7 mL Mouth Rinse BID  . furosemide  20 mg Oral Daily  . insulin aspart  0-15 Units Subcutaneous TID WC  . insulin aspart  0-5 Units Subcutaneous QHS  . metoprolol succinate  100 mg Oral Daily  . piperacillin-tazobactam (ZOSYN)  IV  3.375 g Intravenous Q8H  . predniSONE  12.5 mg Oral Q breakfast  . sodium chloride  3 mL Intravenous Q12H   Time spent: 25 minutes, more than 50% in family/patient  counseling/discussions  Continuous Infusions:   Marzetta Board, MD [facility] Pager [phone]. If 7 PM - 7 AM, please contact night-coverage at [email or web address], password [facility] 12/04/2014, 2:00 PM  LOS: 4 days

## 2014-12-04 NOTE — Progress Notes (Addendum)
Inpatient RN visit- [place] [facility] RN Visit-Karen Alford Highland RN  Related admission to [facility] diagnosis of Myelodysplastic syndrome   Pt is DNR code.     Patient seen at bedside, up in the recliner, asleep, but easily awakened by voice, son Delfino Lovett and DIL [name] present for visit. Pt alert & oriented, interactive through out visit, forgetful at times. She reports still being "sore" in her abdomen, denied nausea or other pain. She continues with some incontinence of stools which embarrass her. Per chart review patient has recvd 1 dose of PRN liquid morphine this am and tylenol last evening. Appetite fair, she continues to consume several cups of ice daily. Patient reports she walked to the bathroom today with the walker, has not worked with PT.  WBC 19 and Hb this am 8.7, patient remains on IV ABT. Per patient and family present plan is for discharge tomorrow via non emergent transport. Oxygen, BSC and youth walker all in place in patient's home.  Patient will need prescription at discharge for Liquid Morphine Concentrate 10mg /0.61ml, dispense quantity 39ml. Writer did discuss this with Attending Physician Dr. Cruzita Lederer on 2/29. Family confirmed that their local pharmacy does have the liquid morphine concentrate in a 30 ml bottle.   Patient's home medication list, transfer summary and OOF DNR in place on shadow chart.  Please call [facility] @ [phone]-  with any hospice needs.   Thank you. Flo Shanks, RN, BSN, [place] Liaison  [phone])

## 2014-12-05 ENCOUNTER — Telehealth: Payer: Self-pay | Admitting: *Deleted

## 2014-12-05 LAB — COMPREHENSIVE METABOLIC PANEL
ALBUMIN: 2.5 g/dL — AB (ref 3.5–5.2)
ALT: 290 U/L — AB (ref 0–35)
AST: 35 U/L (ref 0–37)
Alkaline Phosphatase: 70 U/L (ref 39–117)
Anion gap: 6 (ref 5–15)
BUN: 32 mg/dL — ABNORMAL HIGH (ref 6–23)
CHLORIDE: 106 mmol/L (ref 96–112)
CO2: 29 mmol/L (ref 19–32)
Calcium: 8.1 mg/dL — ABNORMAL LOW (ref 8.4–10.5)
Creatinine, Ser: 1.06 mg/dL (ref 0.50–1.10)
GFR calc non Af Amer: 46 mL/min — ABNORMAL LOW (ref >90)
GFR, EST AFRICAN AMERICAN: 53 mL/min — AB (ref >90)
Glucose, Bld: 106 mg/dL — ABNORMAL HIGH (ref 70–99)
Potassium: 3 mmol/L — ABNORMAL LOW (ref 3.5–5.1)
SODIUM: 141 mmol/L (ref 135–145)
Total Bilirubin: 1.9 mg/dL — ABNORMAL HIGH (ref 0.3–1.2)
Total Protein: 5.3 g/dL — ABNORMAL LOW (ref 6.0–8.3)

## 2014-12-05 LAB — CBC
HEMATOCRIT: 24.3 % — AB (ref 36.0–46.0)
HEMOGLOBIN: 8.3 g/dL — AB (ref 12.0–15.0)
MCH: 29 pg (ref 26.0–34.0)
MCHC: 34.2 g/dL (ref 30.0–36.0)
MCV: 85 fL (ref 78.0–100.0)
Platelets: 88 10*3/uL — ABNORMAL LOW (ref 150–400)
RBC: 2.86 MIL/uL — AB (ref 3.87–5.11)
RDW: 15.9 % — AB (ref 11.5–15.5)
WBC: 13.5 10*3/uL — AB (ref 4.0–10.5)

## 2014-12-05 LAB — GLUCOSE, CAPILLARY: Glucose-Capillary: 100 mg/dL — ABNORMAL HIGH (ref 70–99)

## 2014-12-05 MED ORDER — CIPROFLOXACIN HCL 500 MG PO TABS: 500.0000 mg | ORAL_TABLET | Freq: Two times a day (BID) | ORAL | Status: AC

## 2014-12-05 MED ORDER — HYDROXYZINE PAMOATE 25 MG PO CAPS: 25.0000 mg | ORAL_CAPSULE | Freq: Two times a day (BID) | ORAL | Status: AC | PRN

## 2014-12-05 MED ORDER — METOPROLOL SUCCINATE ER 100 MG PO TB24: 100.0000 mg | ORAL_TABLET | Freq: Every morning | ORAL | Status: AC

## 2014-12-05 MED ORDER — FUROSEMIDE 40 MG PO TABS: 20.0000 mg | ORAL_TABLET | Freq: Every day | ORAL | Status: AC

## 2014-12-05 MED ORDER — LORAZEPAM 0.5 MG PO TABS: 1.0000 mg | ORAL_TABLET | Freq: Every day | ORAL | Status: AC

## 2014-12-05 MED ORDER — MORPHINE SULFATE 10 MG/5ML PO SOLN: 2.5000 mg | ORAL | Status: DC | PRN

## 2014-12-05 MED ORDER — HEPARIN SOD (PORK) LOCK FLUSH 100 UNIT/ML IV SOLN
500.0000 [IU] | INTRAVENOUS | Status: AC | PRN
  Administered 2014-12-05: 500 [IU]

## 2014-12-05 MED ORDER — MORPHINE SULFATE (CONCENTRATE) 10 MG /0.5 ML PO SOLN: 2.5000 mg | ORAL | Status: AC | PRN

## 2014-12-05 NOTE — Progress Notes (Signed)
Porta cath deaccessed. Flushed with 10cc NS followed by Heparin 5ml (100u/ml). No bleeding to site, band aid to site for comfort. Rebekah Cross 

## 2014-12-05 NOTE — Progress Notes (Signed)
CARE MANAGEMENT NOTE 12/05/2014  Patient:  Rebekah Cross, Rebekah Cross   Account Number:  [id number]  Date Initiated:  11/30/2014  Documentation initiated by:  DAVIS,RHONDA  Subjective/Objective Assessment:   pt with hgb 4.0, wbc over 35 admitted from home     Action/Plan:   currently with home hospice   Anticipated DC Date:  12/03/2014   Anticipated DC Plan:  [place]  In-house referral  NA      DC Planning Services  CM consult      PAC Choice  NA   Choice offered to / List presented to:  NA   DME arranged  NA      DME agency  NA     [place] arranged  NA      [place] agency  NA   Status of service:  Completed, signed off Medicare Important Message given?   (If response is "NO", the following Medicare IM given date fields will be blank) Date Medicare IM given:   Medicare IM given by:   Date Additional Medicare IM given:   Additional Medicare IM given by:    Discharge Disposition:    Per UR Regulation:  Reviewed for med. necessity/level of care/duration of stay  If discussed at [place] of Stay Meetings, dates discussed:   12/03/2014    Comments:  12/05/14 Edwyna Shell RN [facility] discharge to [place], Santiago Glad, and she stated that she was aware and confirmed home hospice follow up upon discharge.  12/03/14 MMcGibboney, RN, BSN Pt active with [facility].  Feb. 26 2016/Rhonda L. Rosana Hoes, RN, BSN, CCM. Case Management [place] [phone] No discharge needs present of time of review.

## 2014-12-05 NOTE — Telephone Encounter (Signed)
Santiago Glad with [facility] called to inform Dr. Alvy Bimler that Ms. Boulanger has been discharged from the hospital to home.

## 2014-12-05 NOTE — Progress Notes (Signed)
Inpatient RN visit- [place] [facility] RN Visit-Karen Alford Highland RN  Related admission to [facility] diagnosis of Myelodysplastic syndrome.  Pt is DNR code.    Pt alert & oriented, lying in bed, dressed and ready to go home. Patient's daughter Terrence Dupont present during visit. Patient reports some continued abdominal pain and did have a loose stool during visit. Discussed with Attending Physician Dr. Doyle Askew and staff RN Sonia Baller, patient to have a dose of imodium prior to leaving, patient and her daughter advised.  Family and patient have decided that she is now able to discharge via [place], CSW Claiborne Billings made aware. Writer also verified prescription for liquid morphine concentrate with Dr. Doyle Askew, family aware of new rx.  Writer  educated patient and Terrence Dupont on use of O2 at home and use of new rx for lorazepam. Both voiced understanding.  Emma encouraged to contact [facility] with any needs or concerns upon returning home. [facility] home care team made aware of patient discharge.  Patient's home medication list, transfer summary and OOF DNR in place on shadow chart.  Please call [facility] @ [phone] any hospice needs.   Thank you. Flo Shanks, RN, BSN, [place] Liaison  [phone])

## 2014-12-05 NOTE — Discharge Summary (Signed)
Physician Discharge Summary  Rebekah Cross OQH:[id number] DOB: 18-Nov-1924 DOA: 11/29/2014  PCP: Mayra Neer, MD  Admit date: 11/29/2014 Discharge date: 12/05/2014  Recommendations for Outpatient Follow-up:  1. Pt will need to follow up with PCP in 2-3 weeks post discharge 2. Please obtain BMP to evaluate electrolytes and kidney function, potassium level 3. Check also CBC to make sure Hg stable  4. K-dur 40 MEQ given prior to discharge  5. Cipro for 5 more days 6. Pt has had metformin d/c by PCP due to poor oral intake and this is reflected on d/c summary   Discharge Diagnoses:  Principal Problem:   SIRS (systemic inflammatory response syndrome) Active Problems:   MDS (myelodysplastic syndrome) with 5q deletion   CKD (chronic kidney disease) stage 3, GFR 30-59 ml/min   Diabetes mellitus with renal complications   Acute on chronic renal failure   Intractable vomiting with nausea   Symptomatic anemia   Acute on chronic combined systolic and diastolic heart failure   Lactic acidosis   Elevated LFTs   Hospice care patient   DNR (do not resuscitate)  Discharge Condition: Stable  Diet recommendation: As tolerating   History of present illness:  Very pleasant and relatively functional 81 year old lady diagnosed with MDS, currently on hospice. She came in with profound dyspnea on exertion, diarrhea and poor by mouth intake, was found to be severely anemic with a hemoglobin around 4.   Assessment/Plan: Acute on chronic blood loss anemia - due to MDS - s/p 3U of pRBC, improved - Hb stable - pt wants to go home today, stable for d/c with hospice   Enteritis with elevated WBC - C diff negative - initially on empiric Ciprofloxacin and Metronidazole, improving, no loose stools over the past 24 hours, leukocytosis improving. - as WBC trending down, pt is stable for d/c   Elevated liver function tests - Right upper quadrant ultrasound without particular findings as to explain her LFTs  as well as bilirubin elevation - I suspect this is related to profound anemia and poor relative liver perfusion  - LFT trending down and pt with no abd concerns   MDS - on hospice, will continue to monitor at home   Acute on chronic combined systolic and diastolic heart failure  - with evidence of overload and weight gain at home + 3U blood - Her lower extremity edema has improved, continue po Lasix upon discharge per previous home regimen - Mild troponin leak likely due to demand - no indication for further cardiac work up  Renal failure, acute  - likely due to profound anemia - Cr is now WNL  Nausea and vomiting  - improving, tolerating diet, no N/V over the past 24 hours   HTN - restart her home medications upon discharge except Losartan due to renal failure while inpatient   Leukocytosis - improving with antibiotics   Thrombocytopenia - chronic, no signs of active bleedign  Severe PCM - in the setting of chronic illness and now acute illness as outlined above  Code Status: DNR Family Communication: d/w daughter at bedside  Disposition Plan: home today with daughter   Procedures/Studies: Dg Chest Portable 1 View  11/29/2014   CLINICAL DATA:  Shortness of breath for 1 day.  EXAM: PORTABLE CHEST - 1 VIEW  COMPARISON:  09/30/2014  FINDINGS: Tip of the right chest port in the SVC. There is stable cardiomegaly. Vascular congestion is unchanged. Question mild pulmonary edema. No confluent airspace disease. Previous pleural effusions have diminished in  near completely resolved. No pneumothorax. Stable osseous structures.  IMPRESSION: Stable cardiomegaly and vascular congestion. Question mild pulmonary edema. The previous pleural effusions have diminished and are near completely resolved.   Electronically Signed   By: Jeb Levering M.D.   On: 11/29/2014 23:25   Dg Abd Portable 2v  11/30/2014   CLINICAL DATA:  Diarrhea.  Mid lower abdominal pain.  EXAM: PORTABLE ABDOMEN - 2 VIEW   COMPARISON:  Abdominal radiographs 10/04/2014.  CT 10/01/2014  FINDINGS: There is no free intra-abdominal air. Relative paucity of bowel gas. There is air seen within normal caliber small bowel loops in the central pelvis with a few air-fluid levels. Pelvic phleboliths are seen. The included lung bases are clear.  IMPRESSION: Nonspecific bowel-gas pattern with relative paucity of bowel gas. There a few air-fluid levels in scattered small bowel loops in the pelvis, no significant bowel dilatation. No free air.   Electronically Signed   By: Jeb Levering M.D.   On: 11/30/2014 04:42   US Abdomen Limited Ruq  12/02/2014   CLINICAL DATA:  Abdominal pain, nausea vomiting diarrhea.  EXAM: US ABDOMEN LIMITED - RIGHT UPPER QUADRANT  COMPARISON:  None.  FINDINGS: Gallbladder:  No gallstones or wall thickening visualized. No sonographic Murphy sign noted.  Common bile duct:  Diameter: Mildly dilated at 7 mm  Liver:  No focal lesion identified. Within normal limits in parenchymal echogenicity.  IMPRESSION: 1. Mildly dilated common bile duct may relate to patient's age. 2. Normal gallbladder. 3. Normal liver.   Electronically Signed   By: Suzy Bouchard M.D.   On: 12/02/2014 07:37    Discharge Exam: Filed Vitals:   12/05/14 0630  BP: 171/54  Pulse: 82  Temp: 97.5 F (36.4 C)  Resp: 16   Filed Vitals:   12/04/14 0926 12/04/14 1404 12/04/14 2056 12/05/14 0630  BP: 168/58 109/58 147/42 171/54  Pulse: 79 74 75 82  Temp:  98.8 F (37.1 C) 98 F (36.7 C) 97.5 F (36.4 C)  TempSrc:  Oral Oral Oral  Resp: 18 16 16 16   Height:      Weight:    53.8 kg (118 lb 9.7 oz)  SpO2: 100% 97% 97% 98%    General: Pt is alert, follows commands appropriately, not in acute distress Cardiovascular: Regular rate and rhythm, no rubs, no gallops Respiratory: Clear to auscultation bilaterally, no wheezing, no crackles, no rhonchi Abdominal: Soft, non tender, non distended, bowel sounds +, no guarding Extremities: no  cyanosis, pulses palpable bilaterally DP and PT Neuro: Grossly nonfocal  Discharge Instructions  Discharge Instructions    Care order/instruction    Complete by:  As directed   Transfuse Parameters     Practitioner attestation of consent    Complete by:  As directed   I, the ordering practitioner, attest that I have discussed with the patient the benefits, risks, side effects, alternatives, likelihood of achieving goals and potential problems during recovery for the procedure listed.  Procedure:  Blood Product(s)            Medication List    STOP taking these medications        HYDROcodone-acetaminophen 5-325 MG per tablet  Commonly known as:  NORCO/VICODIN     losartan 25 MG tablet  Commonly known as:  COZAAR     metFORMIN 500 MG tablet  Commonly known as:  GLUCOPHAGE     ranitidine 75 MG tablet  Commonly known as:  ZANTAC     scopolamine 1 MG/3DAYS  Commonly known as:  TRANSDERM-SCOP     thalidomide 50 MG capsule  Commonly known as:  THALOMID      TAKE these medications        acetaminophen 500 MG tablet  Commonly known as:  TYLENOL  Take 1,000 mg by mouth every 6 (six) hours as needed for fever (fever).     aspirin 81 MG tablet  Take 81 mg by mouth daily.     ciprofloxacin 500 MG tablet  Commonly known as:  CIPRO  Take 1 tablet (500 mg total) by mouth 2 (two) times daily.     desoximetasone 0.25 % cream  Commonly known as:  TOPICORT  Apply 1 application topically 2 (two) times daily as needed. For rash     furosemide 40 MG tablet  Commonly known as:  LASIX  Take 0.5 tablets (20 mg total) by mouth daily.     hydrOXYzine 25 MG capsule  Commonly known as:  VISTARIL  Take 1 capsule (25 mg total) by mouth 2 (two) times daily as needed for itching.     LORazepam 0.5 MG tablet  Commonly known as:  ATIVAN  Take 2 tablets (1 mg total) by mouth at bedtime.     metoprolol succinate 100 MG 24 hr tablet  Commonly known as:  TOPROL-XL  Take 1 tablet (100  mg total) by mouth every morning.     morphine CONCENTRATE 10 mg / 0.5 ml concentrated solution  Take 0.13-0.25 mLs (2.6-5 mg total) by mouth every 4 (four) hours as needed for severe pain.     olmesartan-hydrochlorothiazide 40-25 MG per tablet  Commonly known as:  BENICAR HCT  Take 1 tablet by mouth daily.     omeprazole 40 MG capsule  Commonly known as:  PRILOSEC  Take 1 capsule (40 mg total) by mouth daily.     ondansetron 8 MG tablet  Commonly known as:  ZOFRAN  Take 1 tablet (8 mg total) by mouth 2 (two) times daily as needed for nausea or vomiting.     potassium chloride SA 20 MEQ tablet  Commonly known as:  K-DUR,KLOR-CON  Take 1 tablet (20 mEq total) by mouth daily.     predniSONE 2.5 MG tablet  Commonly known as:  DELTASONE  Take 12.5 mg by mouth daily with breakfast. Take 5 tablets (12.5 mg) daily.     prochlorperazine 10 MG tablet  Commonly known as:  COMPAZINE  Take 10 mg by mouth daily.     senna 8.6 MG Tabs tablet  Commonly known as:  SENOKOT  Take 1 tablet (8.6 mg total) by mouth daily as needed for mild constipation.     senna-docusate 8.6-50 MG per tablet  Commonly known as:  Senokot-S  Take 1 tablet by mouth 2 (two) times daily.     traMADol 50 MG tablet  Commonly known as:  ULTRAM  Take 50 mg by mouth 2 (two) times daily.             Follow-up Information    Follow up with Faye Ramsay, MD.   Specialty:  Internal Medicine   Why:  As needed call my cell phone [phone]   Contact information:   [address] [place] [address] [phone]        The results of significant diagnostics from this hospitalization (including imaging, microbiology, ancillary and laboratory) are listed below for reference.     Microbiology: Recent Results (from the past 240 hour(s))  MRSA PCR  Screening     Status: None   Collection Time: 11/30/14  4:00 AM  Result Value Ref Range Status   MRSA by PCR NEGATIVE NEGATIVE Final     Comment:        The GeneXpert MRSA Assay (FDA approved for NASAL specimens only), is one component of a comprehensive MRSA colonization surveillance program. It is not intended to diagnose MRSA infection nor to guide or monitor treatment for MRSA infections.   Clostridium Difficile by PCR     Status: None   Collection Time: 11/30/14  5:30 AM  Result Value Ref Range Status   C difficile by pcr NEGATIVE NEGATIVE Final    Comment: Performed at [facility]: Basic Metabolic Panel:  Recent Labs Lab 12/01/14 0820 12/02/14 0430 12/03/14 0450 12/04/14 0520 12/05/14 0345  NA 136 138 141 142 141  K 3.3* 3.0* 3.8 3.1* 3.0*  CL 104 100 104 105 106  CO2 22 27 28 28 29   GLUCOSE 111* 128* 144* 125* 106*  BUN 57* 52* 47* 42* 32*  CREATININE 2.04* 1.81* 1.38* 1.28* 1.06  CALCIUM 7.8* 8.0* 8.4 8.5 8.1*   Liver Function Tests:  Recent Labs Lab 12/01/14 0820 12/02/14 0430 12/03/14 0450 12/04/14 0520 12/05/14 0345  AST 441* 310* 159* 62* 35  ALT 846* 763* 644* 437* 290*  ALKPHOS 85 78 78 75 70  BILITOT 3.7* 4.3* 3.6* 2.2* 1.9*  PROT 5.4* 5.4* 5.7* 5.5* 5.3*  ALBUMIN 2.8* 2.7* 2.7* 2.7* 2.5*   CBC:  Recent Labs Lab 11/29/14 2243  12/01/14 0820 12/02/14 0430 12/03/14 0450 12/04/14 0520 12/05/14 0345  WBC 34.5*  < > 29.1* 20.4* 17.8* 19.0* 13.5*  NEUTROABS 24.5*  --   --   --   --   --   --   HGB 4.2*  < > 9.0* 8.5* 8.8* 8.7* 8.3*  HCT 14.4*  < > 25.3* 24.2* 25.3* 24.7* 24.3*  MCV 88.3  < > 82.1 82.3 84.9 85.5 85.0  PLT 191  < > 105* 99* 103* 104* 88*  < > = values in this interval not displayed. Cardiac Enzymes:  Recent Labs Lab 11/30/14 0034  TROPONINI 0.13*   BNP: BNP (last 3 results)  Recent Labs  10/01/14 0414 10/04/14 0450 11/30/14 0034  BNP 888.4* 362.4* 3223.3*     CBG:  Recent Labs Lab 12/04/14 0743 12/04/14 1142 12/04/14 1700 12/04/14 2052 12/05/14 0747  GLUCAP 117* 172* 191* 213* 100*     SIGNED: Time  coordinating discharge: Over 30 minutes  Faye Ramsay, MD  [facility] 12/05/2014, 10:15 AM Pager [phone]  If 7PM-7AM, please contact night-coverage [email or web address] Password [email or web address]

## 2014-12-05 NOTE — Discharge Instructions (Signed)

## 2014-12-13 ENCOUNTER — Other Ambulatory Visit: Payer: Self-pay | Admitting: Hematology and Oncology

## 2014-12-13 ENCOUNTER — Encounter: Payer: Self-pay | Admitting: *Deleted

## 2015-01-04 DEATH — deceased

## 2015-06-22 IMAGING — CT CT ABD-PELV W/O CM
2 of 5 series · 17 of 46 positions shown, 19 images · non-contrast
Comparison: None.

CLINICAL DATA: Acute left upper quadrant pain. Splenomegaly.
Myelofibrosis.

EXAM:
CT ABDOMEN AND PELVIS WITHOUT CONTRAST
TECHNIQUE: Multidetector CT imaging of the abdomen and pelvis was performed
following the standard protocol without IV contrast.

[Series 2: rtn a/p w/o · axial · non-contrast · 0.69mm/px · z∈[-696,-336]mm · 14 of 80 slices shown, 16 images]
[im 4/80  soft-tissue]
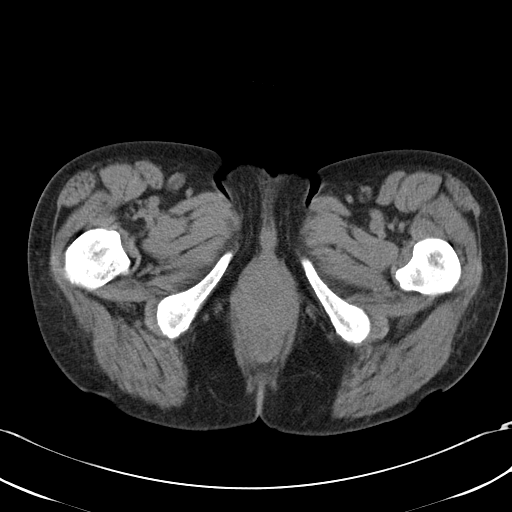
[im 4/80  bone]
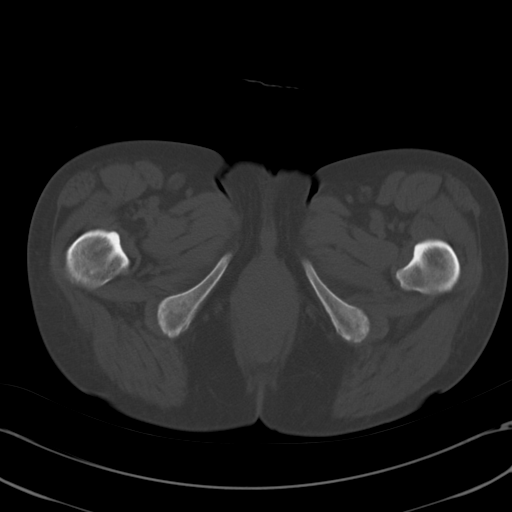
[im 10/80  soft-tissue]
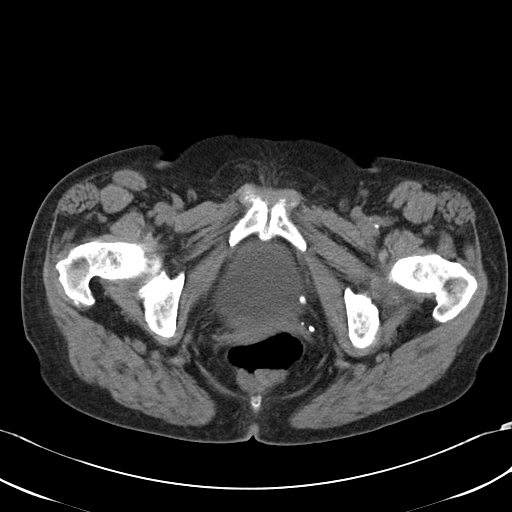
[im 17/80  soft-tissue]
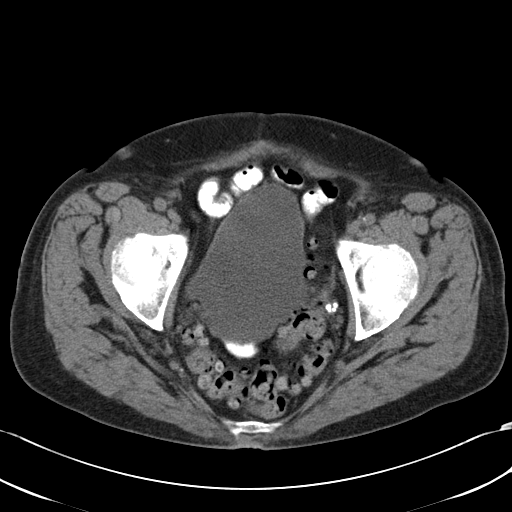
[im 20/80  soft-tissue]
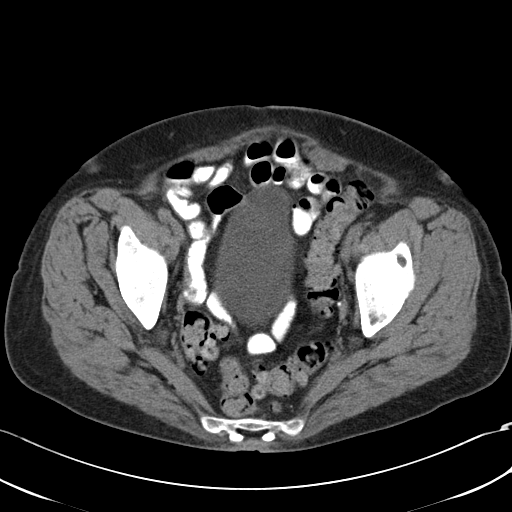
[im 27/80  soft-tissue]
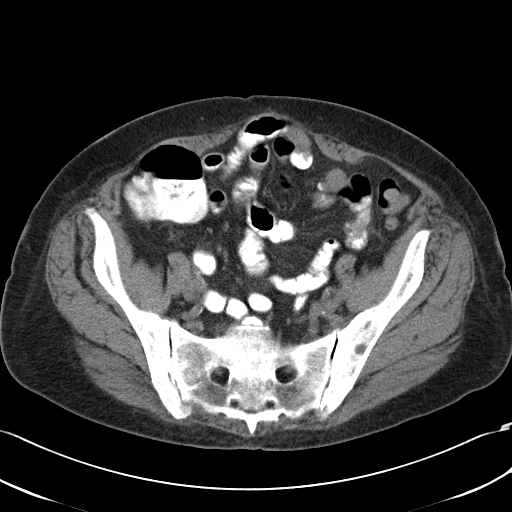
[im 33/80  soft-tissue]
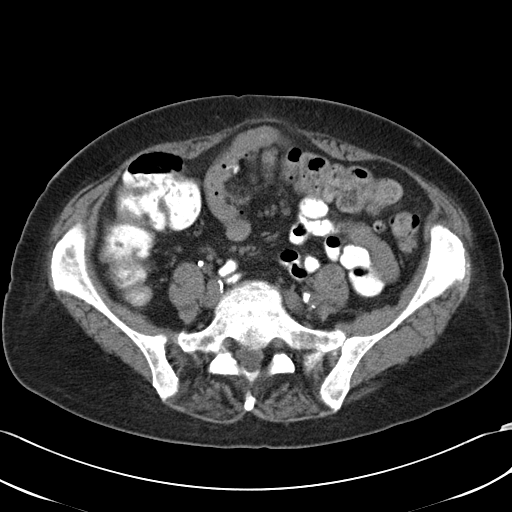
[im 37/80  soft-tissue]
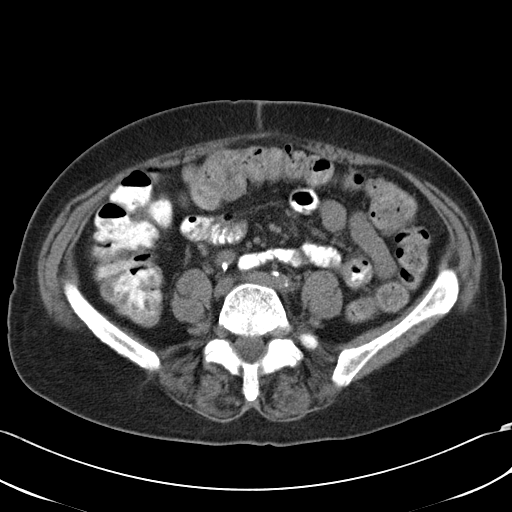
[im 43/80  soft-tissue]
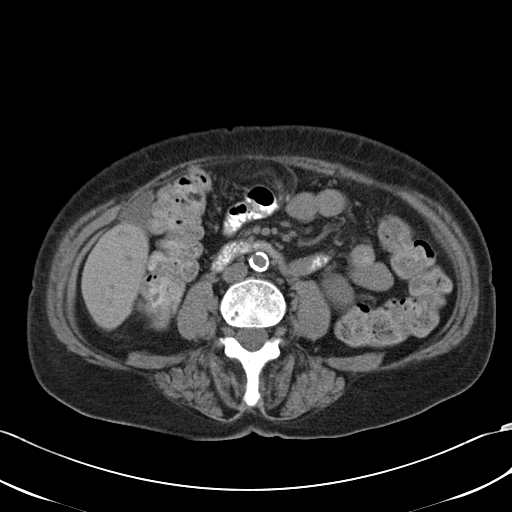
[im 47/80  soft-tissue]
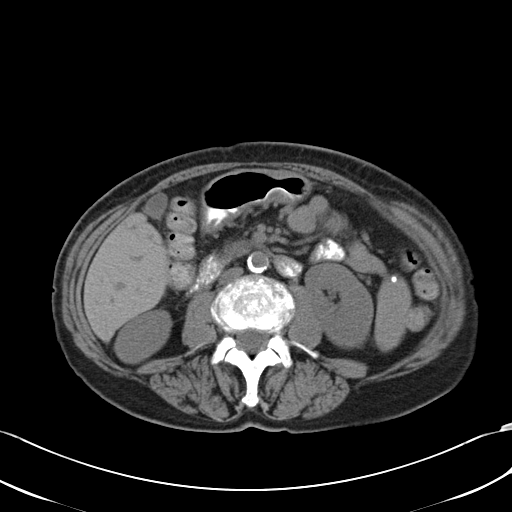
[im 47/80  bone]
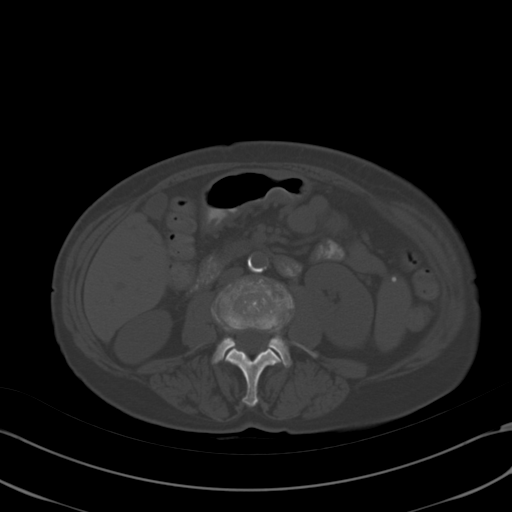
[im 53/80  soft-tissue]
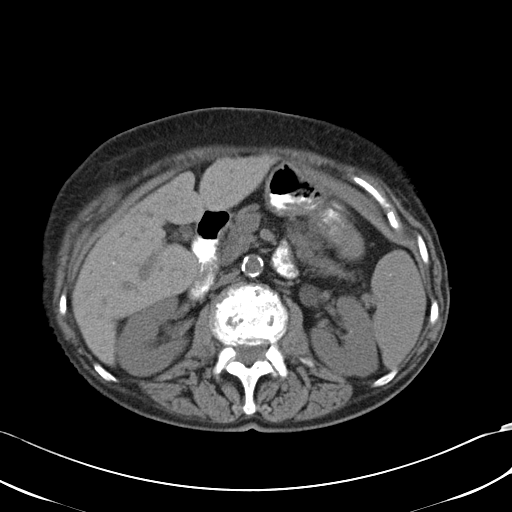
[im 60/80  soft-tissue]
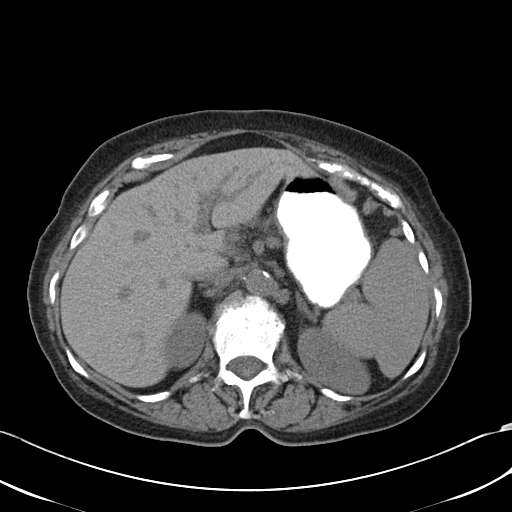
[im 63/80  soft-tissue]
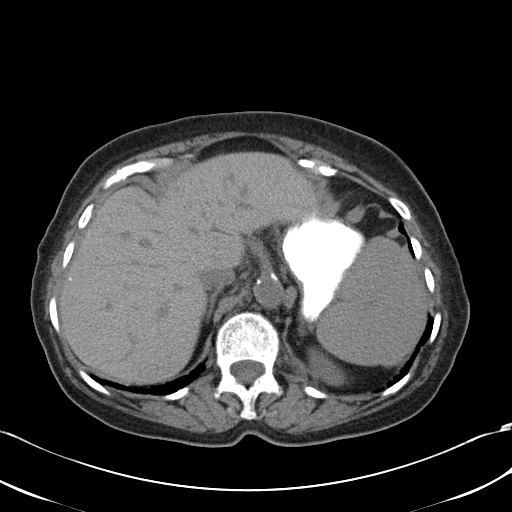
[im 70/80  soft-tissue]
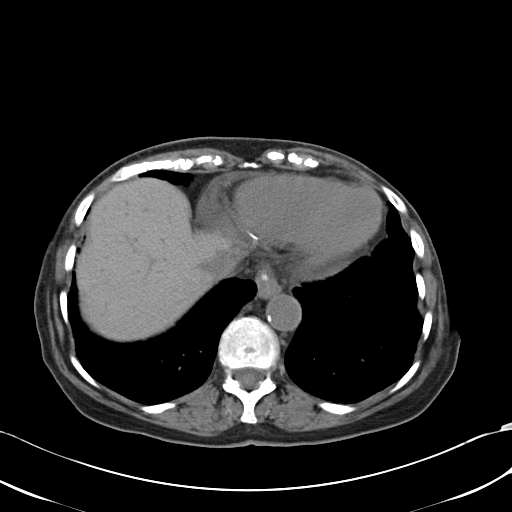
[im 76/80  soft-tissue]
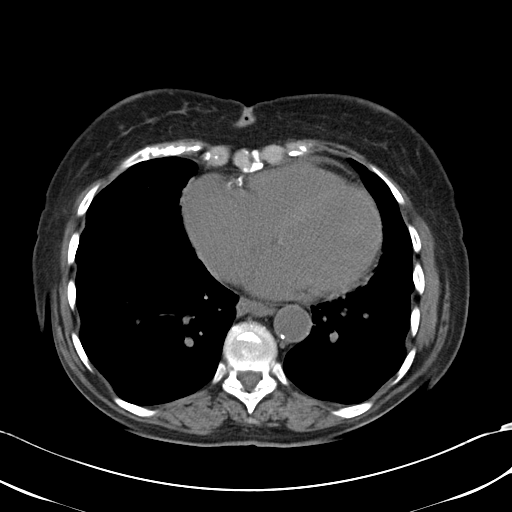

[Series 604: <mpr thick range(1)> · coronal · 0.77mm/px · 3 of 83 slices shown]
[im 28/83  soft-tissue]
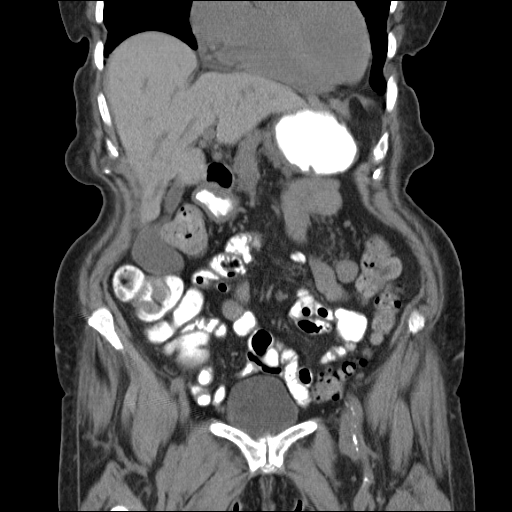
[im 37/83  soft-tissue]
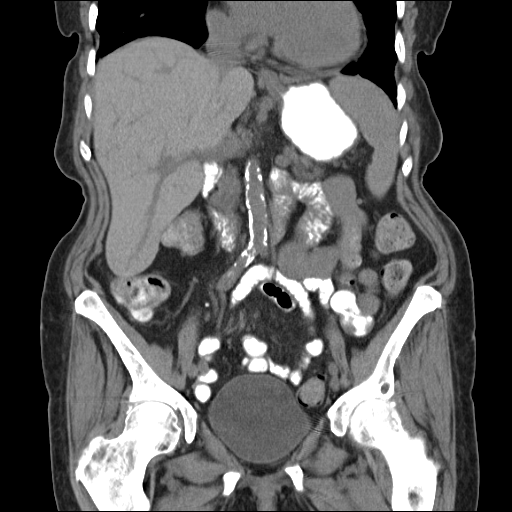
[im 46/83  soft-tissue]
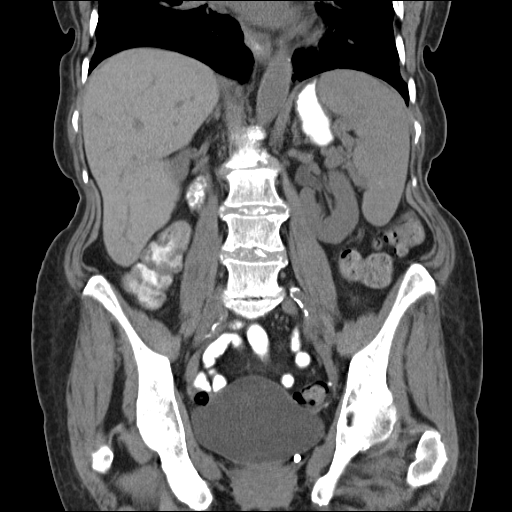

[17 of 46 positions shown; findings below may reference images not displayed]

FINDINGS: Lower chest:  Unremarkable.

Hepatobiliary: No mass or other abnormality visualized on this
non-contrast exam.

Pancreas: No mass or inflammatory process visualized on this
non-contrast exam.

Spleen: Borderline splenomegaly, with spleen measuring approximately
12 cm in length. No evidence of perisplenic fluid or hematoma.

Adrenal Glands:  No mass identified.

Kidneys/Urinary tract: No evidence of urolithiasis or
hydronephrosis.

Stomach/Bowel/Peritoneum: Diverticulosis seen involving the sigmoid
colon. No evidence of diverticulitis or other inflammatory process.

Vascular/Lymphatic: No pathologically enlarged lymph nodes
identified. No other significant abnormality identified.

Reproductive: No mass or other significant abnormality noted. Prior
hysterectomy.

Other:  None.

Musculoskeletal:  No suspicious bone lesions identified.
IMPRESSION: No acute findings.

Borderline splenomegaly, with spleen measuring approximately 12 cm
in length.

Diverticulosis. No radiographic evidence of diverticulitis.
